# Patient Record
Sex: Male | Born: 1960 | Race: White | Hispanic: No | Marital: Single | Smoking: Never smoker
Health system: Southern US, Community
[De-identification: ages and names within clinical notes are randomized; demographics above are authoritative.]

## PROBLEM LIST (undated history)

## (undated) DIAGNOSIS — E781 Pure hyperglyceridemia: Secondary | ICD-10-CM

## (undated) DIAGNOSIS — I509 Heart failure, unspecified: Secondary | ICD-10-CM

## (undated) DIAGNOSIS — Z87442 Personal history of urinary calculi: Secondary | ICD-10-CM

## (undated) DIAGNOSIS — J42 Unspecified chronic bronchitis: Secondary | ICD-10-CM

## (undated) DIAGNOSIS — I10 Essential (primary) hypertension: Secondary | ICD-10-CM

## (undated) DIAGNOSIS — S0990XA Unspecified injury of head, initial encounter: Secondary | ICD-10-CM

## (undated) DIAGNOSIS — F32A Depression, unspecified: Secondary | ICD-10-CM

## (undated) DIAGNOSIS — I219 Acute myocardial infarction, unspecified: Secondary | ICD-10-CM

## (undated) DIAGNOSIS — E119 Type 2 diabetes mellitus without complications: Secondary | ICD-10-CM

## (undated) DIAGNOSIS — R51 Headache: Secondary | ICD-10-CM

## (undated) DIAGNOSIS — M545 Low back pain, unspecified: Secondary | ICD-10-CM

## (undated) DIAGNOSIS — F329 Major depressive disorder, single episode, unspecified: Secondary | ICD-10-CM

## (undated) DIAGNOSIS — I519 Heart disease, unspecified: Secondary | ICD-10-CM

## (undated) DIAGNOSIS — R519 Headache, unspecified: Secondary | ICD-10-CM

## (undated) DIAGNOSIS — E785 Hyperlipidemia, unspecified: Secondary | ICD-10-CM

## (undated) DIAGNOSIS — I2510 Atherosclerotic heart disease of native coronary artery without angina pectoris: Secondary | ICD-10-CM

## (undated) DIAGNOSIS — M1990 Unspecified osteoarthritis, unspecified site: Secondary | ICD-10-CM

## (undated) DIAGNOSIS — L409 Psoriasis, unspecified: Secondary | ICD-10-CM

## (undated) DIAGNOSIS — J45909 Unspecified asthma, uncomplicated: Secondary | ICD-10-CM

## (undated) DIAGNOSIS — F419 Anxiety disorder, unspecified: Secondary | ICD-10-CM

## (undated) DIAGNOSIS — K219 Gastro-esophageal reflux disease without esophagitis: Secondary | ICD-10-CM

## (undated) DIAGNOSIS — I82409 Acute embolism and thrombosis of unspecified deep veins of unspecified lower extremity: Secondary | ICD-10-CM

## (undated) DIAGNOSIS — C159 Malignant neoplasm of esophagus, unspecified: Secondary | ICD-10-CM

## (undated) DIAGNOSIS — G8929 Other chronic pain: Secondary | ICD-10-CM

## (undated) HISTORY — DX: Psoriasis, unspecified: L40.9

## (undated) HISTORY — DX: Malignant neoplasm of esophagus, unspecified: C15.9

## (undated) HISTORY — PX: OTHER SURGICAL HISTORY: SHX169

## (undated) HISTORY — DX: Acute embolism and thrombosis of unspecified deep veins of unspecified lower extremity: I82.409

## (undated) HISTORY — DX: Morbid (severe) obesity due to excess calories: E66.01

## (undated) HISTORY — PX: FRACTURE SURGERY: SHX138

## (undated) HISTORY — DX: Pure hyperglyceridemia: E78.1

## (undated) HISTORY — PX: CORONARY ANGIOPLASTY WITH STENT PLACEMENT: SHX49

---
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
## 2004-06-20 ENCOUNTER — Emergency Department (HOSPITAL_COMMUNITY): Admission: EM | Admit: 2004-06-20 | Discharge: 2004-06-20 | Payer: Self-pay | Admitting: Emergency Medicine

## 2006-04-22 DIAGNOSIS — I82409 Acute embolism and thrombosis of unspecified deep veins of unspecified lower extremity: Secondary | ICD-10-CM

## 2006-04-22 HISTORY — DX: Acute embolism and thrombosis of unspecified deep veins of unspecified lower extremity: I82.409

## 2006-11-07 ENCOUNTER — Encounter: Payer: Self-pay | Admitting: Family Medicine

## 2006-11-07 ENCOUNTER — Inpatient Hospital Stay (HOSPITAL_COMMUNITY): Admission: AD | Admit: 2006-11-07 | Discharge: 2006-11-11 | Payer: Self-pay | Admitting: Family Medicine

## 2007-11-02 ENCOUNTER — Emergency Department (HOSPITAL_COMMUNITY): Admission: EM | Admit: 2007-11-02 | Discharge: 2007-11-03 | Payer: Self-pay | Admitting: Emergency Medicine

## 2010-02-20 DIAGNOSIS — I219 Acute myocardial infarction, unspecified: Secondary | ICD-10-CM

## 2010-02-20 DIAGNOSIS — I509 Heart failure, unspecified: Secondary | ICD-10-CM

## 2010-02-20 HISTORY — DX: Acute myocardial infarction, unspecified: I21.9

## 2010-02-20 HISTORY — DX: Heart failure, unspecified: I50.9

## 2010-03-13 ENCOUNTER — Encounter: Payer: Self-pay | Admitting: Emergency Medicine

## 2010-03-13 ENCOUNTER — Inpatient Hospital Stay (HOSPITAL_COMMUNITY): Admission: EM | Admit: 2010-03-13 | Discharge: 2010-03-20 | Payer: Self-pay | Admitting: Cardiovascular Disease

## 2010-05-18 NOTE — Procedures (Signed)
  NAMEDONEVIN, Steven Crane                  ACCOUNT NO.:  [id number]  MEDICAL RECORD NO.:  [id number]          PATIENT TYPE:  INP  LOCATION:  6531                         FACILITY:  [facility]  PHYSICIAN:  Ricki Rodriguez, M.D.  DATE OF BIRTH:  [birth year]  DATE OF PROCEDURE:  03/14/2010 DATE OF DISCHARGE:  03/20/2010                           CARDIAC CATHETERIZATION   PROCEDURE DONE BY:  Ricki Rodriguez, MD  PROCEDURES:  Left heart catheterization, selective coronary angiography, and left ventricular function study.  INDICATIONS:  This is a 50 year old white male who presented with chest pain and suffered a non-Q-wave myocardial infarction.  He has cardiac risk factor of hypertension, obesity, and hyperlipidemia.  DESCRIPTION OF PROCEDURE:  The patient was brought to the Cardiac Catheterization Laboratory and prepped and draped in the usual sterile fashion.  Right femoral artery was cannulated using modified Seldinger technique using preformed catheters.  The right and left coronary artery angiography was obtained in different angulations.  Using a pigtail catheter, left ventriculogram was performed and pressure measurements were done, left ventricular cavity and in aorta.  The patient tolerated the procedure very well, had no complications.  HEMODYNAMIC DATA:  The left ventricular pressure was 112/19 and aortic pressure was 112/80.  CORONARY ANATOMY:  Left main:  The left main was unremarkable.  Left anterior descending coronary artery:  The left anterior descending artery had proximal mild narrowing followed by 70-80% eccentric lesion. The mid and distal vessel was unremarkable.  The diagonal 1 vessel had mild proximal disease.  Left circumflex coronary artery:  The left circumflex coronary artery had a large obtuse marginal branch 1 and was unremarkable and had obtuse marginal branch 2 with a proximal 90% stenosis.  The rest of the vessels were unremarkable.  Right coronary artery:   The right coronary artery was dominant and was relatively large vessel that had mild calcification and luminal irregularities throughout, had a distal 40% eccentric lesion.  Distal marginal branch had a total occlusion.  His posterior descending coronary artery had a mild proximal disease and posterolateral branch was essentially unremarkable.  Left ventriculogram:  The left ventriculogram showed mild anterior wall hypokinesia with an ejection fraction of 60%.  IMPRESSION: 1. Moderate to severe multivessel native vessel coronary artery     disease. 2. Preserved left systolic function.  PLAN:  To consider nuclear stress test for ischemia burden and follow this with angioplasty of LAD or circumflex coronary artery or right coronary marginal branch.  The case was reviewed with Dr. Rinaldo Cloud.     Ricki Rodriguez, M.D.     ASK/MEDQ  D:  05/03/2010  T:  05/04/2010  Job:  [id number]  Electronically Signed by Orpah Iverson M.D. on 05/16/2010 12:52:19 PM

## 2010-07-03 LAB — CBC
HCT: 37.4 % — ABNORMAL LOW (ref 39.0–52.0)
HCT: 37.6 % — ABNORMAL LOW (ref 39.0–52.0)
HCT: 38.1 % — ABNORMAL LOW (ref 39.0–52.0)
HCT: 39.6 % (ref 39.0–52.0)
Hemoglobin: 12.8 g/dL — ABNORMAL LOW (ref 13.0–17.0)
Hemoglobin: 12.8 g/dL — ABNORMAL LOW (ref 13.0–17.0)
Hemoglobin: 12.9 g/dL — ABNORMAL LOW (ref 13.0–17.0)
Hemoglobin: 12.9 g/dL — ABNORMAL LOW (ref 13.0–17.0)
Hemoglobin: 14.3 g/dL (ref 13.0–17.0)
MCH: 28.8 pg (ref 26.0–34.0)
MCH: 29 pg (ref 26.0–34.0)
MCH: 29.2 pg (ref 26.0–34.0)
MCH: 29.9 pg (ref 26.0–34.0)
MCHC: 33.8 g/dL (ref 30.0–36.0)
MCHC: 34 g/dL (ref 30.0–36.0)
MCHC: 34 g/dL (ref 30.0–36.0)
MCHC: 34.5 g/dL (ref 30.0–36.0)
MCHC: 36.2 g/dL — ABNORMAL HIGH (ref 30.0–36.0)
MCV: 85.2 fL (ref 78.0–100.0)
MCV: 85.2 fL (ref 78.0–100.0)
MCV: 85.6 fL (ref 78.0–100.0)
MCV: 85.9 fL (ref 78.0–100.0)
Platelets: 203 10*3/uL (ref 150–400)
Platelets: 206 10*3/uL (ref 150–400)
Platelets: 213 10*3/uL (ref 150–400)
Platelets: 220 10*3/uL (ref 150–400)
RBC: 4.38 MIL/uL (ref 4.22–5.81)
RBC: 4.44 MIL/uL (ref 4.22–5.81)
RBC: 4.44 MIL/uL (ref 4.22–5.81)
RBC: 4.61 MIL/uL (ref 4.22–5.81)
RDW: 12.9 % (ref 11.5–15.5)
RDW: 13 % (ref 11.5–15.5)
WBC: 6.7 10*3/uL (ref 4.0–10.5)
WBC: 7 10*3/uL (ref 4.0–10.5)
WBC: 8.1 10*3/uL (ref 4.0–10.5)

## 2010-07-03 LAB — BASIC METABOLIC PANEL
BUN: 10 mg/dL (ref 6–23)
CO2: 25 mEq/L (ref 19–32)
CO2: 28 mEq/L (ref 19–32)
Calcium: 8.4 mg/dL (ref 8.4–10.5)
Chloride: 104 mEq/L (ref 96–112)
Chloride: 105 mEq/L (ref 96–112)
Creatinine, Ser: 0.7 mg/dL (ref 0.4–1.5)
GFR calc Af Amer: >60 mL/min (ref >60)
GFR calc Af Amer: >60 mL/min (ref >60)
Glucose, Bld: 142 mg/dL — ABNORMAL HIGH (ref 70–99)
Glucose, Bld: 214 mg/dL — ABNORMAL HIGH (ref 70–99)
Sodium: 137 mEq/L (ref 135–145)

## 2010-07-03 LAB — HEPARIN LEVEL (UNFRACTIONATED)
Heparin Unfractionated: 0.11 IU/mL — ABNORMAL LOW (ref 0.30–0.70)
Heparin Unfractionated: 0.38 IU/mL (ref 0.30–0.70)
Heparin Unfractionated: 0.42 IU/mL (ref 0.30–0.70)
Heparin Unfractionated: 0.63 IU/mL (ref 0.30–0.70)
Heparin Unfractionated: <0.1 IU/mL — ABNORMAL LOW (ref 0.30–0.70)

## 2010-07-03 LAB — COMPREHENSIVE METABOLIC PANEL
ALT: 34 U/L (ref 0–53)
AST: 26 U/L (ref 0–37)
Albumin: 3.6 g/dL (ref 3.5–5.2)
CO2: 23 mEq/L (ref 19–32)
Calcium: 8.9 mg/dL (ref 8.4–10.5)
Creatinine, Ser: 0.69 mg/dL (ref 0.4–1.5)
GFR calc Af Amer: >60 mL/min (ref >60)
GFR calc non Af Amer: >60 mL/min (ref >60)
Sodium: 135 mEq/L (ref 135–145)
Total Protein: 6.8 g/dL (ref 6.0–8.3)

## 2010-07-03 LAB — GLUCOSE, CAPILLARY
Glucose-Capillary: 118 mg/dL — ABNORMAL HIGH (ref 70–99)
Glucose-Capillary: 124 mg/dL — ABNORMAL HIGH (ref 70–99)
Glucose-Capillary: 125 mg/dL — ABNORMAL HIGH (ref 70–99)
Glucose-Capillary: 140 mg/dL — ABNORMAL HIGH (ref 70–99)
Glucose-Capillary: 144 mg/dL — ABNORMAL HIGH (ref 70–99)
Glucose-Capillary: 147 mg/dL — ABNORMAL HIGH (ref 70–99)
Glucose-Capillary: 158 mg/dL — ABNORMAL HIGH (ref 70–99)
Glucose-Capillary: 168 mg/dL — ABNORMAL HIGH (ref 70–99)
Glucose-Capillary: 188 mg/dL — ABNORMAL HIGH (ref 70–99)
Glucose-Capillary: 203 mg/dL — ABNORMAL HIGH (ref 70–99)
Glucose-Capillary: 211 mg/dL — ABNORMAL HIGH (ref 70–99)
Glucose-Capillary: 212 mg/dL — ABNORMAL HIGH (ref 70–99)
Glucose-Capillary: 226 mg/dL — ABNORMAL HIGH (ref 70–99)
Glucose-Capillary: 227 mg/dL — ABNORMAL HIGH (ref 70–99)
Glucose-Capillary: 229 mg/dL — ABNORMAL HIGH (ref 70–99)
Glucose-Capillary: 240 mg/dL — ABNORMAL HIGH (ref 70–99)

## 2010-07-03 LAB — CK TOTAL AND CKMB (NOT AT ARMC)
CK, MB: 4.4 ng/mL — ABNORMAL HIGH (ref 0.3–4.0)
Relative Index: 4.2 — ABNORMAL HIGH (ref 0.0–2.5)
Relative Index: INVALID (ref 0.0–2.5)
Total CK: 105 U/L (ref 7–232)

## 2010-07-03 LAB — CARDIAC PANEL(CRET KIN+CKTOT+MB+TROPI)
CK, MB: 2.3 ng/mL (ref 0.3–4.0)
Relative Index: 5.8 — ABNORMAL HIGH (ref 0.0–2.5)
Relative Index: INVALID (ref 0.0–2.5)
Total CK: 124 U/L (ref 7–232)
Total CK: 71 U/L (ref 7–232)
Troponin I: 1.46 ng/mL (ref 0.00–0.06)

## 2010-07-03 LAB — POCT CARDIAC MARKERS: Troponin i, poc: <0.05 ng/mL (ref 0.00–0.09)

## 2010-07-03 LAB — DIFFERENTIAL
Eosinophils Absolute: 0.1 10*3/uL (ref 0.0–0.7)
Eosinophils Relative: 2 % (ref 0–5)
Lymphocytes Relative: 22 % (ref 12–46)
Lymphs Abs: 1.5 10*3/uL (ref 0.7–4.0)
Monocytes Absolute: 0.4 10*3/uL (ref 0.1–1.0)
Monocytes Relative: 6 % (ref 3–12)

## 2010-07-03 LAB — LIPID PANEL
Cholesterol: 189 mg/dL (ref 0–200)
LDL Cholesterol: 103 mg/dL — ABNORMAL HIGH (ref 0–99)
Total CHOL/HDL Ratio: 5.7 RATIO
Triglycerides: 264 mg/dL — ABNORMAL HIGH (ref <150)

## 2010-07-03 LAB — PLATELET INHIBITION P2Y12
P2Y12 % Inhibition: 25 %
Platelet Function  P2Y12: 236 [PRU] (ref 194–418)
Platelet Function Baseline: 314 [PRU] (ref 194–418)

## 2010-07-03 LAB — PROTIME-INR: Prothrombin Time: 13.5 seconds (ref 11.6–15.2)

## 2010-07-03 LAB — TROPONIN I: Troponin I: 0.11 ng/mL — ABNORMAL HIGH (ref 0.00–0.06)

## 2010-09-04 NOTE — Discharge Summary (Signed)
Steven Crane, Steven Crane                  ACCOUNT NO.:  [id number]   MEDICAL RECORD NO.:  [id number]          PATIENT TYPE:  INP   LOCATION:  A327                          FACILITY:  [facility]   PHYSICIAN:  Donna Bernard, M.D.DATE OF BIRTH:  July 05, 1960   DATE OF ADMISSION:  11/07/2006  DATE OF DISCHARGE:  07/22/2008LH                               DISCHARGE SUMMARY   FINAL DIAGNOSES:  1. Deep vein thrombosis.  2. Cellulitis.  3. Type 2 diabetes.  4. Morbid obesity.  5. Hypertension.  6. Hyperlipidemia.   DISPOSITION:  1. Patient discharged home.  2. Coumadin 5 mg two daily.  3. Lovenox 150 mg q.12h.  4. Lantus 12 units q.h.s.  5. Maintain other chronic medications as specified on discharge sheet.  6. Recheck on Friday plus an INR then.   INITIAL H&P:  Please see H&P as dictated.   HOSPITAL COURSE:  This patient is a 50 year old white male with a  history of morbid obesity.  He presented in the emergency room in  [place] 4 days prior to admission with a swollen, reddened left leg,  high white blood count of 16,000 and a temp of 104.  He was started  appropriately with Rocephin injection and Omnicef for presumed  cellulitis.  Next several days did not do the best.  We followed him up  in the office.  Patient had ongoing swelling and tenderness.  We added a  different antibiotic.  He presented a few days later not improved.  We  went ahead and did a leg ultrasound which showed a DVT extending well  into the thigh.  The patient was admitted to the hospital.  He was given  IV Unasyn, he was given Coumadin orally and he was given Lovenox  injections.  Patient has responded to the antibiotics nicely.  His leg  is feeling much better.  I have had a difficult time getting him  therapeutic on the INR.  Clinically, at this point patient is safe and  ready to head home.  We are going to discharge him home on Lovenox  injections with very close followup as noted above.      Donna Bernard, M.D.  Electronically Signed    WSL/MEDQ  D:  11/11/2006  T:  11/11/2006  Job:  [id number]

## 2010-09-04 NOTE — H&P (Signed)
Steven Crane, Steven Crane                  ACCOUNT NO.:  [id number]   MEDICAL RECORD NO.:  [id number]          PATIENT TYPE:  INP   LOCATION:  A327                          FACILITY:  [facility]   PHYSICIAN:  Donna Bernard, M.D.DATE OF BIRTH:  1960-09-18   DATE OF ADMISSION:  11/07/2006  DATE OF DISCHARGE:  LH                              HISTORY & PHYSICAL   CHIEF COMPLAINT:  Leg pain   SUBJECTIVE:  This patient is a 50 year old white male with history of  type 2 diabetes, hypertension, morbid obesity and hyperlipidemia who  arrived to the office the day of admission with acute concerns.  Four  days previously he had presented to the ER in [place] with a temperature  105.  At that point at 16,000 white blood count and very inflamed,  tender and red left leg.  The patient was given IV Rocephin and  encouraged him to be hospitalized.  He declined and basically left AMA.  The patient was placed on Omnicef generic equivalent.  He re-presents to  the office 4 days later with  ongoing pain, swelling and tenderness in  the leg.  The patient has no chest pain, no cough, no shortness of  breath.  His sugars continue to do poorly, generally range in the mid to  upper 200s.  He claims compliance with his medications which include  Lopid 600 1 b.i.d., Glucovance 5/500 2  b.i.d., Vasotec 10 mg daily and  Prilosec 20 mg OTC.   PAST SURGERIES:  Remote knee operation 15 years ago.   FAMILY HISTORY:  Significant for hypertension, diabetes, coronary artery  disease, hyperlipidemia, lung cancer.   ALLERGIES:  Very sensitive to ACTOS.   SOCIAL:  The patient works for [employer], does have for boys, no tobacco use.  Does drink alcohol, beer on occasion.   REVIEW OF SYSTEMS:  Otherwise negative.   PHYSICAL EXAMINATION:  BPM on repeat 142/80, temperature 99 patient is  alert, no acute distress.  Significant obesity noted.  HEENT: Normal.  NECK:  Supple.  LUNGS:  Clear.  HEART:  Regular rate and rhythm.  ABDOMEN:  Benign.  EXTREMITIES:  Left leg erythema, edema, tenderness, swelling evident,  positive Homans' sign.   SIGNIFICANT LABS:  CBC white blood count is now at 6.6.  It was 16 4  days ago.  D-dimer normal.  Left leg ultrasound shows a deep venous  thrombosis extending well into the thigh.   IMPRESSION:  1. Left leg DVT extending high into the thigh.  2. Left leg cellulitis. I really think this was the initiating event.      The patient initially had fever, tenderness, high white blood      count, high fever.  This led to an inactivity of the leg and I      think in turn triggered a clot.  3. Type 2 diabetes, controlled poor, recent A1c 11.5.  I  have advised      the patient we really press on with insulin if we are going to      reduce complications like this in  the future.  4. Hypertension  5. Hyperlipidemia  6. Morbid obesity.   PLAN:  As per orders.      Donna Bernard, M.D.  Electronically Signed     WSL/MEDQ  D:  11/09/2006  T:  11/09/2006  Job:  [id number]

## 2011-02-04 LAB — PROTIME-INR
INR: 1
INR: 1
INR: 1
Prothrombin Time: 12.9
Prothrombin Time: 13.3
Prothrombin Time: 13.7

## 2011-11-22 ENCOUNTER — Telehealth: Payer: Self-pay

## 2011-11-22 NOTE — Telephone Encounter (Signed)
Pt to bring meds by on 11/25/2011 and get scheduled for colonoscopy.

## 2011-11-28 NOTE — Telephone Encounter (Signed)
Pt brought med list by. Waiting on September schedule.

## 2011-12-03 ENCOUNTER — Other Ambulatory Visit: Payer: Self-pay

## 2011-12-03 DIAGNOSIS — Z139 Encounter for screening, unspecified: Secondary | ICD-10-CM

## 2011-12-10 ENCOUNTER — Telehealth: Payer: Self-pay

## 2011-12-10 DIAGNOSIS — Z139 Encounter for screening, unspecified: Secondary | ICD-10-CM

## 2011-12-10 NOTE — Telephone Encounter (Signed)
Gastroenterology Pre-Procedure Form   Called and Re-triaged pt. He waited for the Sept schedule   He had a heart attack in Nov 2011 and heart doctor ( he could not remember his name) put him on the ASA 325 mg in the AM and 81 mg in the PM  Request Date: 12/10/2011     Requesting Physician: Dr. Lubertha South     PATIENT INFORMATION:  Steven Crane is a 51 y.o., male (DOB=09/25/1960).  PROCEDURE: Procedure(s) requested: colonoscopy Procedure Reason: screening for colon cancer  PATIENT REVIEW QUESTIONS: The patient reports the following:   1. Diabetes Melitis:YES 2. Joint replacements in the past 12 months: no 3. Major health problems in the past 3 months: no 4. Has an artificial valve or MVP:no 5. Has been advised in past to take antibiotics in advance of a procedure like teeth cleaning: no}    MEDICATIONS & ALLERGIES:    Patient reports the following regarding taking any blood thinners:   Plavix? no Aspirin  YES Coumadin   No  Patient confirms/reports the following medications:  Current Outpatient Prescriptions  Medication Sig Dispense Refill  . ALPRAZolam (XANAX) 0.5 MG tablet Take 0.5 mg by mouth at bedtime as needed. Pt takes one to one and a half tablet at bedtime      . aspirin 325 MG tablet Take 325 mg by mouth daily. Pt takes one 325 mg ASA in the AM      . aspirin 81 MG tablet Take 81 mg by mouth daily. Pt takes one 81 mg ASA in the PM      . fenofibrate 160 MG tablet Take 160 mg by mouth daily.      Marland Kitchen glyBURIDE-metformin (GLUCOVANCE) 5-500 MG per tablet Take 1 tablet by mouth 2 (two) times daily with a meal.      . omeprazole (PRILOSEC) 40 MG capsule Take 40 mg by mouth daily.      . pravastatin (PRAVACHOL) 40 MG tablet Take 40 mg by mouth daily.        Patient confirms/reports the following allergies:  No Known Allergies  Patient is appropriate to schedule for requested procedure(s): yes  AUTHORIZATION INFORMATION Primary Insurance:   ID #:   Group #:  Pre-Cert /  Auth required: Pre-Cert / Auth #:   Secondary Insurance:   ID #:   Group #:  Pre-Cert / Auth required: Pre-Cert / Auth #:   No orders of the defined types were placed in this encounter.    SCHEDULE INFORMATION: Procedure has been scheduled as follows:  Date: 01/06/2012     Time: 7:30 AM  Location: [facility] Short Stay  This Gastroenterology Pre-Precedure Form is being routed to the following provider(s) for review: R. Roetta Sessions, MD

## 2011-12-10 NOTE — Telephone Encounter (Signed)
OK to proceed with colonoscopy Take half of your Glucovance the day prior to the colonoscopy (date of prep) Hold diabetes medications day of procedure Bring all your medications and/or any insulin to the hospital the day of the procedure. Follow blood sugars, call us or your PCP if any problems.

## 2011-12-11 MED ORDER — PEG 3350-KCL-NA BICARB-NACL 420 G PO SOLR: 4000.0000 L | ORAL | Status: AC

## 2011-12-11 NOTE — Telephone Encounter (Signed)
Rx sent to [facility] in [place]. Instructions mailed to pt.

## 2011-12-11 NOTE — Telephone Encounter (Signed)
See triage dated 12/10/2011.

## 2011-12-24 ENCOUNTER — Encounter (HOSPITAL_COMMUNITY): Payer: Self-pay | Admitting: Pharmacy Technician

## 2011-12-31 ENCOUNTER — Telehealth: Payer: Self-pay

## 2011-12-31 NOTE — Telephone Encounter (Signed)
See triage addendum

## 2011-12-31 NOTE — Telephone Encounter (Signed)
Called to update triage. Pt said he has not had any new problems and no change in his medicines. He has received his instructions and picked up his prescription. He will call if he has any questions.

## 2011-12-31 NOTE — Telephone Encounter (Signed)
Ok to proceed w/ colonoscopy w/ instructions as below

## 2012-01-06 ENCOUNTER — Ambulatory Visit (HOSPITAL_COMMUNITY)
Admission: RE | Admit: 2012-01-06 | Discharge: 2012-01-06 | Disposition: A | Discharge status: Home / Self Care | Payer: BC Managed Care – PPO | Source: Ambulatory Visit | Attending: Internal Medicine | Admitting: Internal Medicine

## 2012-01-06 ENCOUNTER — Encounter (HOSPITAL_COMMUNITY)
Admission: RE | Disposition: A | Discharge status: Home / Self Care | Payer: Self-pay | Source: Ambulatory Visit | Attending: Internal Medicine

## 2012-01-06 ENCOUNTER — Encounter (HOSPITAL_COMMUNITY): Payer: Self-pay | Admitting: *Deleted

## 2012-01-06 DIAGNOSIS — Z1211 Encounter for screening for malignant neoplasm of colon: Secondary | ICD-10-CM | POA: Insufficient documentation

## 2012-01-06 DIAGNOSIS — Z139 Encounter for screening, unspecified: Secondary | ICD-10-CM

## 2012-01-06 DIAGNOSIS — I10 Essential (primary) hypertension: Secondary | ICD-10-CM | POA: Insufficient documentation

## 2012-01-06 DIAGNOSIS — E785 Hyperlipidemia, unspecified: Secondary | ICD-10-CM | POA: Insufficient documentation

## 2012-01-06 DIAGNOSIS — E119 Type 2 diabetes mellitus without complications: Secondary | ICD-10-CM | POA: Insufficient documentation

## 2012-01-06 HISTORY — DX: Unspecified injury of head, initial encounter: S09.90XA

## 2012-01-06 HISTORY — DX: Gastro-esophageal reflux disease without esophagitis: K21.9

## 2012-01-06 HISTORY — DX: Acute myocardial infarction, unspecified: I21.9

## 2012-01-06 HISTORY — DX: Essential (primary) hypertension: I10

## 2012-01-06 HISTORY — DX: Atherosclerotic heart disease of native coronary artery without angina pectoris: I25.10

## 2012-01-06 HISTORY — DX: Unspecified asthma, uncomplicated: J45.909

## 2012-01-06 HISTORY — DX: Hyperlipidemia, unspecified: E78.5

## 2012-01-06 HISTORY — DX: Headache: R51

## 2012-01-06 HISTORY — PX: COLONOSCOPY: SHX5424

## 2012-01-06 HISTORY — DX: Unspecified osteoarthritis, unspecified site: M19.90

## 2012-01-06 SURGERY — COLONOSCOPY
Anesthesia: Moderate Sedation

## 2012-01-06 MED ORDER — SIMETHICONE 40 MG/0.6ML PO SUSP
ORAL | Status: DC | PRN
  Administered 2012-01-06: 07:00:00

## 2012-01-06 MED ORDER — MIDAZOLAM HCL 5 MG/5ML IJ SOLN
INTRAMUSCULAR | Status: AC
  Filled 2012-01-06: qty 10

## 2012-01-06 MED ORDER — MIDAZOLAM HCL 5 MG/5ML IJ SOLN
INTRAMUSCULAR | Status: DC | PRN
  Administered 2012-01-06 (×2): 2 mg via INTRAVENOUS

## 2012-01-06 MED ORDER — SODIUM CHLORIDE 0.45 % IV SOLN
INTRAVENOUS | Status: DC
  Administered 2012-01-06: 1000 mL via INTRAVENOUS

## 2012-01-06 MED ORDER — MEPERIDINE HCL 100 MG/ML IJ SOLN
INTRAMUSCULAR | Status: AC
  Filled 2012-01-06: qty 2

## 2012-01-06 MED ORDER — MEPERIDINE HCL 100 MG/ML IJ SOLN
INTRAMUSCULAR | Status: DC | PRN
  Administered 2012-01-06 (×2): 50 mg via INTRAVENOUS

## 2012-01-06 NOTE — H&P (Signed)
Primary Care Physician:  Harlow Asa, MD Primary Gastroenterologist:  Dr. Jena Gauss  Pre-Procedure History & Physical: HPI:  Steven Crane is a 51 y.o. male is here for a screening colonoscopy. No bowel symptoms. No Family history of colon cancer colon polyps. No prior colonoscopy.  Past Medical History  Diagnosis Date  . Coronary artery disease   . Closed head injury 1975  . Myocardial infarction   . Hypertension   . Hyperlipemia   . Asthma     as a child  . Diabetes mellitus   . GERD (gastroesophageal reflux disease)   . Headache   . Arthritis     Past Surgical History  Procedure Date  . Arthroscopy left knee 1981  . Cardiac catheterization 2011  . Coronary stent placement Nov  2011    Prior to Admission medications   Medication Sig Start Date End Date Taking? Authorizing Provider  ALPRAZolam Prudy Feeler) 0.5 MG tablet Take 0.5-1 mg by mouth at bedtime as needed. For sleep   Yes Historical Provider, MD  aspirin 325 MG tablet Take 325 mg by mouth daily. Pt takes one 325 mg ASA in the AM   Yes Historical Provider, MD  aspirin 81 MG tablet Take 81 mg by mouth daily. Pt takes one 81 mg ASA in the PM   Yes Historical Provider, MD  enalapril (VASOTEC) 10 MG tablet Take 10 mg by mouth daily.   Yes Historical Provider, MD  fenofibrate 160 MG tablet Take 160 mg by mouth daily.   Yes Historical Provider, MD  glyBURIDE-metformin (GLUCOVANCE) 5-500 MG per tablet Take 2 tablets by mouth 2 (two) times daily with a meal.    Yes Historical Provider, MD  omeprazole (PRILOSEC) 40 MG capsule Take 40 mg by mouth daily.   Yes Historical Provider, MD  pravastatin (PRAVACHOL) 40 MG tablet Take 40 mg by mouth daily.   Yes Historical Provider, MD    Allergies as of 12/03/2011  . (Not on File)    Family History  Problem Relation Age of Onset  . Coronary artery disease Father   . Diabetes type II Father     History   Social History  . Marital Status: Divorced    Spouse Name: N/A    Number of  Children: N/A  . Years of Education: N/A   Occupational History  . Not on file.   Social History Main Topics  . Smoking status: Never Smoker   . Smokeless tobacco: Current User    Types: Snuff  . Alcohol Use: Yes     social  . Drug Use: No  . Sexually Active: Yes   Other Topics Concern  . Not on file   Social History Narrative  . No narrative on file    Review of Systems: See HPI, otherwise negative ROS  Physical Exam: BP 147/88  Pulse 72  Temp 98.6 F (37 C) (Oral)  Resp 20  Ht 6\' 2"  (1.88 m)  Wt 310 lb (140.615 kg)  BMI 39.80 kg/m2  SpO2 96% General:   Alert,  Well-developed, well-nourished, pleasant and cooperative in NAD Head:  Normocephalic and atraumatic. Eyes:  Sclera clear, no icterus.   Conjunctiva pink. Ears:  Normal auditory acuity. Nose:  No deformity, discharge,  or lesions. Mouth:  No deformity or lesions, dentition normal. Neck:  Supple; no masses or thyromegaly. Lungs:  Clear throughout to auscultation.   No wheezes, crackles, or rhonchi. No acute distress. Heart:  Regular rate and rhythm; no murmurs, clicks, rubs,  or gallops. Abdomen:  Obese. Positive bowel sounds. Soft nontender without appreciable mass or organomegaly Msk:  Symmetrical without gross deformities. Normal posture. Pulses:  Normal pulses noted. Extremities:  Without clubbing or edema. Neurologic:  Alert and  oriented x4;  grossly normal neurologically. Skin:  Intact without significant lesions or rashes. Cervical Nodes:  No significant cervical adenopathy. Psych:  Alert and cooperative. Normal mood and affect.  Impression/Plan: Steven Crane is now here to undergo a screening colonoscopy. First ever average risk screening examination.  Risks, benefits, limitations, imponderables and alternatives regarding colonoscopy have been reviewed with the patient. Questions have been answered. All parties agreeable.

## 2012-01-06 NOTE — Op Note (Signed)
[facility] [address] [place], [address]   COLONOSCOPY PROCEDURE REPORT  PATIENT: Steven Crane, Steven Crane  MR#:         [id number] BIRTHDATE: 1960/06/05 , 51  yrs. old GENDER: Male ENDOSCOPIST: R.  Roetta Sessions, MD FACP FACG REFERRED BY:  Simone Curia, M.D. PROCEDURE DATE:  01/06/2012 PROCEDURE:     screening colonoscopy  INDICATIONS: first ever average risk screening examination  INFORMED CONSENT:  The risks, benefits, alternatives and imponderables including but not limited to bleeding, perforation as well as the possibility of a missed lesion have been reviewed.  The potential for biopsy, lesion removal, etc. have also been discussed.  Questions have been answered.  All parties agreeable. Please see the history and physical in the medical record for more information.  MEDICATIONS: Versed 4 mg IV and Demerol 100 mg IV in divided doses the  DESCRIPTION OF PROCEDURE:  After a digital rectal exam was performed, the EC-[id number] ([id number])  colonoscope was advanced from the anus through the rectum and colon to the area of the cecum, ileocecal valve and appendiceal orifice.  The cecum was deeply intubated.  These structures were well-seen and photographed for the record.  From the level of the cecum and ileocecal valve, the scope was slowly and cautiously withdrawn.  The mucosal surfaces were carefully surveyed utilizing scope tip deflection to facilitate fold flattening as needed.  The scope was pulled down into the rectum where a thorough examination including retroflexion was performed.    FINDINGS:  suboptimal preparation. Normal rectum. Normal appearing colonic mucosa.  THERAPEUTIC / DIAGNOSTIC MANEUVERS PERFORMED:  none  COMPLICATIONS: none  CECAL WITHDRAWAL TIME:  8 minutes  IMPRESSION:  normal rectum and colon  RECOMMENDATIONS: repeat screening colonoscopy in 10 years   _______________________________ eSigned:  R. Roetta Sessions, MD FACP [facility]  01/06/2012 8:08 AM   CC:

## 2012-01-08 ENCOUNTER — Encounter (HOSPITAL_COMMUNITY): Payer: Self-pay | Admitting: Internal Medicine

## 2012-08-12 ENCOUNTER — Ambulatory Visit (INDEPENDENT_AMBULATORY_CARE_PROVIDER_SITE_OTHER): Payer: BC Managed Care – PPO | Admitting: Family Medicine

## 2012-08-12 ENCOUNTER — Encounter: Payer: Self-pay | Admitting: Family Medicine

## 2012-08-12 VITALS — BP 132/77 | Temp 98.7°F | Wt 299.0 lb

## 2012-08-12 DIAGNOSIS — M549 Dorsalgia, unspecified: Secondary | ICD-10-CM

## 2012-08-12 DIAGNOSIS — R5381 Other malaise: Secondary | ICD-10-CM

## 2012-08-12 DIAGNOSIS — R5383 Other fatigue: Secondary | ICD-10-CM

## 2012-08-12 DIAGNOSIS — E119 Type 2 diabetes mellitus without complications: Secondary | ICD-10-CM

## 2012-08-12 DIAGNOSIS — E785 Hyperlipidemia, unspecified: Secondary | ICD-10-CM

## 2012-08-12 MED ORDER — ETODOLAC ER 400 MG PO TB24: 400.0000 mg | ORAL_TABLET | Freq: Every day | ORAL | Status: DC

## 2012-08-12 MED ORDER — CIPROFLOXACIN-HYDROCORTISONE 0.2-1 % OT SUSP: 4.0000 [drp] | Freq: Two times a day (BID) | OTIC | Status: AC

## 2012-08-12 MED ORDER — CHLORZOXAZONE 500 MG PO TABS: 500.0000 mg | ORAL_TABLET | Freq: Four times a day (QID) | ORAL | Status: DC | PRN

## 2012-08-12 NOTE — Patient Instructions (Signed)
Be sure to get blood work week before next visit

## 2012-08-12 NOTE — Progress Notes (Signed)
  Subjective:    Patient ID: Steven Crane, male    DOB: [birth year], 52 y.o.   MRN: [id number]  Otalgia  There is pain in the right ear. This is a new problem. The current episode started in the past 7 days. The problem has been gradually worsening. There has been no fever. The pain is moderate. Associated symptoms include coughing, ear discharge and rhinorrhea. He has tried nothing for the symptoms. The treatment provided no relief. His past medical history is significant for a chronic ear infection. There is no history of hearing loss.  Back Pain This is a new problem. The current episode started in the past 7 days. The problem occurs constantly. The problem has been gradually worsening since onset. The quality of the pain is described as shooting and aching. The pain is at a severity of 6/10. The pain is moderate. The pain is worse during the day. The symptoms are aggravated by bending. Stiffness is present all day. Risk factors include recent trauma. He has tried analgesics and ice for the symptoms. The treatment provided mild relief.      Review of Systems  HENT: Positive for ear pain, rhinorrhea and ear discharge.   Respiratory: Positive for cough.   Musculoskeletal: Positive for back pain.       Objective:   Physical Exam   Right external canal inflamed. Positive tenderness. Pharynx normal neck supple. Lungs clear. Heart regular rate and rhythm. Diffuse lumbar tenderness. Negative straight leg raise.     Assessment & Plan:  Impression #1 external otitis. #2 lumbar strain. Plan local measures discussed. C. medicines as prescribed. Appropriate blood work for chronic visit next month. WSL

## 2012-08-14 LAB — LIPID PANEL
Cholesterol: 126 mg/dL (ref 0–200)
HDL: 29 mg/dL — ABNORMAL LOW (ref >39)
LDL Cholesterol: 76 mg/dL (ref 0–99)
Triglycerides: 103 mg/dL (ref <150)
VLDL: 21 mg/dL (ref 0–40)

## 2012-08-14 LAB — HEPATIC FUNCTION PANEL
ALT: 21 U/L (ref 0–53)
AST: 16 U/L (ref 0–37)
Albumin: 3.5 g/dL (ref 3.5–5.2)
Alkaline Phosphatase: 42 U/L (ref 39–117)
Total Bilirubin: 0.3 mg/dL (ref 0.3–1.2)

## 2012-08-17 ENCOUNTER — Encounter: Payer: Self-pay | Admitting: Family Medicine

## 2012-08-25 ENCOUNTER — Encounter: Payer: Self-pay | Admitting: Family Medicine

## 2012-09-01 ENCOUNTER — Other Ambulatory Visit: Payer: Self-pay | Admitting: Family Medicine

## 2012-09-12 ENCOUNTER — Other Ambulatory Visit: Payer: Self-pay | Admitting: Family Medicine

## 2012-09-17 ENCOUNTER — Encounter: Payer: Self-pay | Admitting: *Deleted

## 2012-09-18 ENCOUNTER — Ambulatory Visit: Payer: BC Managed Care – PPO | Admitting: Family Medicine

## 2012-09-25 ENCOUNTER — Encounter: Payer: Self-pay | Admitting: Family Medicine

## 2012-09-25 ENCOUNTER — Ambulatory Visit (INDEPENDENT_AMBULATORY_CARE_PROVIDER_SITE_OTHER): Payer: BC Managed Care – PPO | Admitting: Family Medicine

## 2012-09-25 VITALS — BP 138/82 | HR 80 | Wt 290.0 lb

## 2012-09-25 DIAGNOSIS — I2510 Atherosclerotic heart disease of native coronary artery without angina pectoris: Secondary | ICD-10-CM

## 2012-09-25 DIAGNOSIS — I10 Essential (primary) hypertension: Secondary | ICD-10-CM

## 2012-09-25 DIAGNOSIS — L408 Other psoriasis: Secondary | ICD-10-CM

## 2012-09-25 DIAGNOSIS — L409 Psoriasis, unspecified: Secondary | ICD-10-CM

## 2012-09-25 DIAGNOSIS — Z86718 Personal history of other venous thrombosis and embolism: Secondary | ICD-10-CM

## 2012-09-25 DIAGNOSIS — E119 Type 2 diabetes mellitus without complications: Secondary | ICD-10-CM

## 2012-09-25 DIAGNOSIS — E785 Hyperlipidemia, unspecified: Secondary | ICD-10-CM

## 2012-09-25 NOTE — Progress Notes (Signed)
  Subjective:    Patient ID: Steven Crane, male    DOB: 06-13-1960, 52 y.o.   MRN: [id number]  Diabetes He has type 2 diabetes mellitus. His disease course has been stable. Associated symptoms include blurred vision. Pertinent negatives for diabetes include no chest pain. Symptoms are stable. Risk factors for coronary artery disease include diabetes mellitus, hypertension, dyslipidemia and male sex. Current diabetic treatment includes oral agent (monotherapy). He is following a diabetic diet. Meal planning includes avoidance of concentrated sweets. He has not had a previous visit with a dietician. He participates in exercise every other day. Home blood sugar record trend: pt unaware of fasting numbers not checking. An ACE inhibitor/angiotensin II receptor blocker is being taken. Eye exam is not current.   Patient has no chest pain. Known history of coronary artery disease. Trying to watch his diet. No smoke exposure.  Patient reports compliance with his blood pressure medication. Generally well-tolerated.  Patient trying to stick with his lipid medicine. Phone his diet closely. Did have blood work earlier this spring.  Sticking with his triglyceride medicine. Trying to watch his diet in this regard Results for orders placed in visit on 08/12/12  LIPID PANEL      Result Value Range   Cholesterol 126  0 - 200 mg/dL   Triglycerides 409  <811 mg/dL   HDL 29 (*) >91 mg/dL   Total CHOL/HDL Ratio 4.3     VLDL 21  0 - 40 mg/dL   LDL Cholesterol 76  0 - 99 mg/dL  HEMOGLOBIN Y7W      Result Value Range   Hemoglobin A1C 8.3 (*) <5.7 %   Mean Plasma Glucose 192 (*) <117 mg/dL  HEPATIC FUNCTION PANEL      Result Value Range   Total Bilirubin 0.3  0.3 - 1.2 mg/dL   Bilirubin, Direct 0.1  0.0 - 0.3 mg/dL   Indirect Bilirubin 0.2  0.0 - 0.9 mg/dL   Alkaline Phosphatase 42  39 - 117 U/L   AST 16  0 - 37 U/L   ALT 21  0 - 53 U/L   Total Protein 6.0  6.0 - 8.3 g/dL   Albumin 3.5  3.5 - 5.2 g/dL      Review of Systems  Eyes: Positive for blurred vision.  Cardiovascular: Negative for chest pain.   ROS otherwise negative    Objective:   Physical Exam  Alert no acute distress. HEENT normal. Lungs clear. Heart regular in rhythm. Significant obesity present feet sensation intact pulses good. Slight edema..      Assessment & Plan:  Impression 1 hypertension decent control. #2 coronary artery disease asymptomatic #3 hyperlipidemia controlled good in the spring. #4 reflux clinically stable. #5 type 2 diabetes suboptimal in with A1c in 8 discussed at length. Plan meds discussed. Maintain all the same. Diet exercise discussed. Encouraged to see eye doctor. Recheck in several months. A1c not improve will need to add TAC on the insulin. WSL.

## 2012-09-25 NOTE — Patient Instructions (Signed)
Work hard on diet and exercise. When we see you next, if a1c is worse we will add insulin again.  Please get an eye doctor visit

## 2012-10-05 ENCOUNTER — Other Ambulatory Visit: Payer: Self-pay | Admitting: *Deleted

## 2012-10-05 MED ORDER — GLYBURIDE-METFORMIN 5-500 MG PO TABS: ORAL_TABLET | ORAL | Status: DC

## 2012-11-30 ENCOUNTER — Encounter: Payer: Self-pay | Admitting: Family Medicine

## 2012-11-30 ENCOUNTER — Ambulatory Visit (INDEPENDENT_AMBULATORY_CARE_PROVIDER_SITE_OTHER): Payer: BC Managed Care – PPO | Admitting: Family Medicine

## 2012-11-30 VITALS — BP 144/82 | Temp 98.6°F | Ht 74.0 in | Wt 332.4 lb

## 2012-11-30 DIAGNOSIS — J329 Chronic sinusitis, unspecified: Secondary | ICD-10-CM

## 2012-11-30 DIAGNOSIS — J45901 Unspecified asthma with (acute) exacerbation: Secondary | ICD-10-CM

## 2012-11-30 DIAGNOSIS — J683 Other acute and subacute respiratory conditions due to chemicals, gases, fumes and vapors: Secondary | ICD-10-CM | POA: Insufficient documentation

## 2012-11-30 MED ORDER — ALBUTEROL SULFATE HFA 108 (90 BASE) MCG/ACT IN AERS: 2.0000 | INHALATION_SPRAY | Freq: Four times a day (QID) | RESPIRATORY_TRACT | Status: DC | PRN

## 2012-11-30 MED ORDER — LEVOFLOXACIN 500 MG PO TABS: 500.0000 mg | ORAL_TABLET | Freq: Every day | ORAL | Status: AC

## 2012-11-30 NOTE — Patient Instructions (Signed)
Take all the antibiotics 

## 2012-11-30 NOTE — Progress Notes (Signed)
  Subjective:    Patient ID: Steven Crane, male    DOB: 1960/09/07, 52 y.o.   MRN: [id number]  Sinusitis This is a new problem. The current episode started in the past 7 days. The problem has been gradually worsening since onset. The maximum temperature recorded prior to his arrival was 100 - 100.9 F. The fever has been present for 3 to 4 days. His pain is at a severity of 5/10. The pain is moderate. Associated symptoms include coughing (wheezing associated with the cough) and headaches. Past treatments include nothing. The treatment provided mild relief.      Review of Systems  Respiratory: Positive for cough (wheezing associated with the cough).   Neurological: Positive for headaches.       Objective:   Physical Exam  Alert mild malaise. Lungs mild wheezes no tachypnea no rhonchi heart regular rate and rhythm. HEENT nasal congestion frontal tenderness.      Assessment & Plan:  Impression rhinosinusitis #2 bronchitis with reactive airways. Plan Levaquin daily for 10 days. Ventolin when necessary for wheezes. Symptomatic care discussed. WSL

## 2012-12-07 ENCOUNTER — Other Ambulatory Visit: Payer: Self-pay | Admitting: Family Medicine

## 2012-12-07 NOTE — Telephone Encounter (Signed)
Ok plus 5 monthly ref 

## 2012-12-10 ENCOUNTER — Encounter: Payer: Self-pay | Admitting: *Deleted

## 2012-12-10 ENCOUNTER — Other Ambulatory Visit: Payer: Self-pay | Admitting: *Deleted

## 2012-12-10 MED ORDER — GLYBURIDE-METFORMIN 5-500 MG PO TABS: ORAL_TABLET | ORAL | Status: DC

## 2012-12-25 ENCOUNTER — Other Ambulatory Visit: Payer: Self-pay | Admitting: *Deleted

## 2012-12-25 ENCOUNTER — Ambulatory Visit: Payer: BC Managed Care – PPO | Admitting: Family Medicine

## 2012-12-25 MED ORDER — OMEPRAZOLE 40 MG PO CPDR: 40.0000 mg | DELAYED_RELEASE_CAPSULE | Freq: Every day | ORAL | Status: DC

## 2013-01-12 ENCOUNTER — Other Ambulatory Visit: Payer: Self-pay | Admitting: *Deleted

## 2013-01-12 MED ORDER — GLYBURIDE-METFORMIN 5-500 MG PO TABS: ORAL_TABLET | ORAL | Status: DC

## 2013-01-12 MED ORDER — ENALAPRIL MALEATE 10 MG PO TABS: ORAL_TABLET | ORAL | Status: DC

## 2013-02-12 ENCOUNTER — Encounter: Payer: Self-pay | Admitting: Family Medicine

## 2013-02-12 ENCOUNTER — Telehealth: Payer: Self-pay | Admitting: Family Medicine

## 2013-02-12 ENCOUNTER — Ambulatory Visit (INDEPENDENT_AMBULATORY_CARE_PROVIDER_SITE_OTHER): Payer: BC Managed Care – PPO | Admitting: Family Medicine

## 2013-02-12 VITALS — BP 130/86 | Ht 74.0 in | Wt 313.0 lb

## 2013-02-12 DIAGNOSIS — J683 Other acute and subacute respiratory conditions due to chemicals, gases, fumes and vapors: Secondary | ICD-10-CM

## 2013-02-12 DIAGNOSIS — Z23 Encounter for immunization: Secondary | ICD-10-CM

## 2013-02-12 DIAGNOSIS — I2510 Atherosclerotic heart disease of native coronary artery without angina pectoris: Secondary | ICD-10-CM

## 2013-02-12 DIAGNOSIS — E785 Hyperlipidemia, unspecified: Secondary | ICD-10-CM

## 2013-02-12 DIAGNOSIS — E119 Type 2 diabetes mellitus without complications: Secondary | ICD-10-CM

## 2013-02-12 DIAGNOSIS — J45909 Unspecified asthma, uncomplicated: Secondary | ICD-10-CM

## 2013-02-12 LAB — POCT GLYCOSYLATED HEMOGLOBIN (HGB A1C): Hemoglobin A1C: 9.4

## 2013-02-12 MED ORDER — ENALAPRIL MALEATE 10 MG PO TABS: ORAL_TABLET | ORAL | Status: DC

## 2013-02-12 MED ORDER — INSULIN GLARGINE 100 UNIT/ML ~~LOC~~ SOLN: SUBCUTANEOUS | Status: DC

## 2013-02-12 MED ORDER — ALPRAZOLAM 0.5 MG PO TABS: ORAL_TABLET | ORAL | Status: DC

## 2013-02-12 MED ORDER — ALBUTEROL SULFATE HFA 108 (90 BASE) MCG/ACT IN AERS: 2.0000 | INHALATION_SPRAY | Freq: Four times a day (QID) | RESPIRATORY_TRACT | Status: DC | PRN

## 2013-02-12 MED ORDER — OMEPRAZOLE 40 MG PO CPDR: 40.0000 mg | DELAYED_RELEASE_CAPSULE | Freq: Every day | ORAL | Status: DC

## 2013-02-12 MED ORDER — OMEPRAZOLE MAGNESIUM 20 MG PO TBEC: 20.0000 mg | DELAYED_RELEASE_TABLET | Freq: Every day | ORAL | Status: DC

## 2013-02-12 MED ORDER — PRAVASTATIN SODIUM 40 MG PO TABS: 40.0000 mg | ORAL_TABLET | Freq: Every day | ORAL | Status: DC

## 2013-02-12 MED ORDER — GLYBURIDE-METFORMIN 5-500 MG PO TABS: ORAL_TABLET | ORAL | Status: DC

## 2013-02-12 MED ORDER — FENOFIBRATE 160 MG PO TABS: 160.0000 mg | ORAL_TABLET | Freq: Every day | ORAL | Status: DC

## 2013-02-12 NOTE — Patient Instructions (Signed)
Start back 12 units  q hs.

## 2013-02-12 NOTE — Telephone Encounter (Signed)
Ok 11 ref. Absolutely silly we have to re name the same med

## 2013-02-12 NOTE — Telephone Encounter (Signed)
Patient states that omeprazole is too expensive and wants to get Rx for Prilosec OTC because it is cheaper  [facility] in [place]

## 2013-02-12 NOTE — Progress Notes (Signed)
  Subjective:    Patient ID: Steven Crane, male    DOB: 20-Apr-1961, 52 y.o.   MRN: [id number]  Diabetes He presents for his follow-up diabetic visit. Current diabetic treatment includes oral agent (dual therapy).  takes diab med regularly, notcking numbers. Not checking fasting sugars. Claims compliance with his medicine.  Diet so so.has cut back on amnts. Admits he was taking in too much sugar at times.   Compliant with blood pressure medication excessive salt at times no headache or chest pain.  Breathing is fine, except occas short on breath with exertion.  When stretched out, knees and backs ache.    Results for orders placed in visit on 02/12/13  POCT GLYCOSYLATED HEMOGLOBIN (HGB A1C)      Result Value Range   Hemoglobin A1C 9.4        Review of Systems No headache no back pain no abdominal pain no shortness of breath ROS otherwise negative    Objective:   Physical Exam  Alert obesity present HEENT normal. Blood pressure similar on repeat. Lungs clear. Heart rare in rhythm. Abdomen benign.      Assessment & Plan:  Impression 1 type 2 diabetes poor control discussed at length #2 hypertension good control. #3 asthma clinically stable #4 hyperlipidemia discussed. Plan diet exercise discussed. Resume Lantus 10 units each day. Rationale discussed maintain other meds. Recheck in several months. Start checking sugars again. WSL

## 2013-02-12 NOTE — Telephone Encounter (Signed)
Rx sent electronically to [facility]. Patient notified. 

## 2013-03-11 ENCOUNTER — Other Ambulatory Visit: Payer: Self-pay | Admitting: Family Medicine

## 2013-03-12 ENCOUNTER — Telehealth: Payer: Self-pay | Admitting: Family Medicine

## 2013-03-12 NOTE — Telephone Encounter (Signed)
error 

## 2013-04-04 ENCOUNTER — Emergency Department (HOSPITAL_COMMUNITY)
Admission: EM | Admit: 2013-04-04 | Discharge: 2013-04-04 | Disposition: A | Discharge status: Home / Self Care | Payer: BC Managed Care – PPO | Attending: Emergency Medicine | Admitting: Emergency Medicine

## 2013-04-04 ENCOUNTER — Emergency Department (HOSPITAL_COMMUNITY): Payer: BC Managed Care – PPO

## 2013-04-04 ENCOUNTER — Encounter (HOSPITAL_COMMUNITY): Payer: Self-pay | Admitting: Emergency Medicine

## 2013-04-04 DIAGNOSIS — I2510 Atherosclerotic heart disease of native coronary artery without angina pectoris: Secondary | ICD-10-CM | POA: Insufficient documentation

## 2013-04-04 DIAGNOSIS — Z86718 Personal history of other venous thrombosis and embolism: Secondary | ICD-10-CM | POA: Insufficient documentation

## 2013-04-04 DIAGNOSIS — Z9861 Coronary angioplasty status: Secondary | ICD-10-CM | POA: Insufficient documentation

## 2013-04-04 DIAGNOSIS — E669 Obesity, unspecified: Secondary | ICD-10-CM | POA: Insufficient documentation

## 2013-04-04 DIAGNOSIS — K219 Gastro-esophageal reflux disease without esophagitis: Secondary | ICD-10-CM | POA: Insufficient documentation

## 2013-04-04 DIAGNOSIS — I10 Essential (primary) hypertension: Secondary | ICD-10-CM | POA: Insufficient documentation

## 2013-04-04 DIAGNOSIS — R61 Generalized hyperhidrosis: Secondary | ICD-10-CM | POA: Insufficient documentation

## 2013-04-04 DIAGNOSIS — E781 Pure hyperglyceridemia: Secondary | ICD-10-CM | POA: Insufficient documentation

## 2013-04-04 DIAGNOSIS — Z7982 Long term (current) use of aspirin: Secondary | ICD-10-CM | POA: Insufficient documentation

## 2013-04-04 DIAGNOSIS — Z872 Personal history of diseases of the skin and subcutaneous tissue: Secondary | ICD-10-CM | POA: Insufficient documentation

## 2013-04-04 DIAGNOSIS — Z87828 Personal history of other (healed) physical injury and trauma: Secondary | ICD-10-CM | POA: Insufficient documentation

## 2013-04-04 DIAGNOSIS — I252 Old myocardial infarction: Secondary | ICD-10-CM | POA: Insufficient documentation

## 2013-04-04 DIAGNOSIS — E119 Type 2 diabetes mellitus without complications: Secondary | ICD-10-CM | POA: Insufficient documentation

## 2013-04-04 DIAGNOSIS — Z794 Long term (current) use of insulin: Secondary | ICD-10-CM | POA: Insufficient documentation

## 2013-04-04 DIAGNOSIS — N201 Calculus of ureter: Secondary | ICD-10-CM

## 2013-04-04 DIAGNOSIS — Z79899 Other long term (current) drug therapy: Secondary | ICD-10-CM | POA: Insufficient documentation

## 2013-04-04 DIAGNOSIS — M129 Arthropathy, unspecified: Secondary | ICD-10-CM | POA: Insufficient documentation

## 2013-04-04 DIAGNOSIS — J45909 Unspecified asthma, uncomplicated: Secondary | ICD-10-CM | POA: Insufficient documentation

## 2013-04-04 LAB — COMPREHENSIVE METABOLIC PANEL
ALT: 39 U/L (ref 0–53)
BUN: 17 mg/dL (ref 6–23)
CO2: 24 mEq/L (ref 19–32)
Calcium: 8.8 mg/dL (ref 8.4–10.5)
Creatinine, Ser: 0.82 mg/dL (ref 0.50–1.35)
GFR calc Af Amer: >90 mL/min (ref >90)
GFR calc non Af Amer: >90 mL/min (ref >90)
Glucose, Bld: 295 mg/dL — ABNORMAL HIGH (ref 70–99)
Sodium: 134 mEq/L — ABNORMAL LOW (ref 135–145)
Total Protein: 7 g/dL (ref 6.0–8.3)

## 2013-04-04 LAB — URINALYSIS, ROUTINE W REFLEX MICROSCOPIC
Bilirubin Urine: NEGATIVE
Glucose, UA: >1000 mg/dL — AB
Ketones, ur: NEGATIVE mg/dL
Leukocytes, UA: NEGATIVE
Nitrite: NEGATIVE
Protein, ur: NEGATIVE mg/dL
Specific Gravity, Urine: 1.02 (ref 1.005–1.030)

## 2013-04-04 LAB — TROPONIN I: Troponin I: <0.3 ng/mL (ref <0.30)

## 2013-04-04 LAB — CBC WITH DIFFERENTIAL/PLATELET
Eosinophils Absolute: 0 10*3/uL (ref 0.0–0.7)
Eosinophils Relative: 0 % (ref 0–5)
HCT: 38.1 % — ABNORMAL LOW (ref 39.0–52.0)
Hemoglobin: 12.4 g/dL — ABNORMAL LOW (ref 13.0–17.0)
Lymphs Abs: 0.9 10*3/uL (ref 0.7–4.0)
MCH: 25.6 pg — ABNORMAL LOW (ref 26.0–34.0)
MCV: 78.7 fL (ref 78.0–100.0)
Monocytes Absolute: 0.4 10*3/uL (ref 0.1–1.0)
Monocytes Relative: 5 % (ref 3–12)
Platelets: 259 10*3/uL (ref 150–400)
RBC: 4.84 MIL/uL (ref 4.22–5.81)

## 2013-04-04 LAB — LACTIC ACID, PLASMA: Lactic Acid, Venous: 2.1 mmol/L (ref 0.5–2.2)

## 2013-04-04 LAB — URINE MICROSCOPIC-ADD ON

## 2013-04-04 LAB — LIPASE, BLOOD: Lipase: 20 U/L (ref 11–59)

## 2013-04-04 MED ORDER — KETOROLAC TROMETHAMINE 30 MG/ML IJ SOLN
30.0000 mg | Freq: Once | INTRAMUSCULAR | Status: AC
  Administered 2013-04-04: 30 mg via INTRAVENOUS
  Filled 2013-04-04: qty 1

## 2013-04-04 MED ORDER — ONDANSETRON HCL 4 MG PO TABS: 4.0000 mg | ORAL_TABLET | Freq: Four times a day (QID) | ORAL | Status: DC

## 2013-04-04 MED ORDER — IOHEXOL 300 MG/ML  SOLN
100.0000 mL | Freq: Once | INTRAMUSCULAR | Status: AC | PRN
  Administered 2013-04-04: 100 mL via INTRAVENOUS

## 2013-04-04 MED ORDER — OXYCODONE-ACETAMINOPHEN 5-325 MG PO TABS: 2.0000 | ORAL_TABLET | ORAL | Status: DC | PRN

## 2013-04-04 MED ORDER — MORPHINE SULFATE 4 MG/ML IJ SOLN
4.0000 mg | Freq: Once | INTRAMUSCULAR | Status: AC
  Administered 2013-04-04: 4 mg via INTRAVENOUS
  Filled 2013-04-04: qty 1

## 2013-04-04 MED ORDER — ONDANSETRON HCL 4 MG/2ML IJ SOLN
4.0000 mg | Freq: Once | INTRAMUSCULAR | Status: AC
  Administered 2013-04-04: 4 mg via INTRAVENOUS
  Filled 2013-04-04: qty 2

## 2013-04-04 MED ORDER — IBUPROFEN 800 MG PO TABS: 800.0000 mg | ORAL_TABLET | Freq: Three times a day (TID) | ORAL | Status: DC

## 2013-04-04 MED ORDER — SODIUM CHLORIDE 0.9 % IV BOLUS (SEPSIS)
500.0000 mL | Freq: Once | INTRAVENOUS | Status: AC
  Administered 2013-04-04: 500 mL via INTRAVENOUS

## 2013-04-04 MED ORDER — IOHEXOL 300 MG/ML  SOLN
50.0000 mL | Freq: Once | INTRAMUSCULAR | Status: AC | PRN
  Administered 2013-04-04: 50 mL via ORAL

## 2013-04-04 NOTE — ED Notes (Signed)
Pt c/o right side abdominal pain that radiates to right groin and right testicle. Pt states pain began this morning. Describes pain in groin as "sharp shooting" and describes pain in abdomen "dull". Pt denies N/V/D, denies unusual urinary symptoms.

## 2013-04-04 NOTE — ED Provider Notes (Signed)
CSN: [id number]     Arrival date & time 04/04/13  1121 History  This chart was scribed for Steven Octave, MD by Quintella Reichert, ED scribe.  This patient was seen in room APA10/[facility] and the patient's care was started at 1:21 PM.   Chief Complaint  Patient presents with  . Abdominal Pain    The history is provided by the patient. No language interpreter was used.    HPI Comments: Steven Crane is a 52 y.o. male with h/o DM, HTN, hyperlipidemia, MI, and CAD who presents to the Emergency Department complaining of severe sharp abdominal pain that began this morning at around 9:30 AM.  Pt states his pain began in the RUQ and has been moving down to the RLQ with some radiation into the right groin.  It is not aggravated or alleviated by anything.  It is sssociated with diaphoresis.  He denies vomiting, CP, back pain, diarrhea, measured fever, urinary symptoms, bowel symptoms, weakness, numbness or tingling.  Pt had an MI in 2011 and states his current symptoms do not feel similar.  He has had stents placed.  He takes insulin for his DM.  He denies personal h/o kidney stones but notes that he does have a family history.  He is on regular and baby aspirin.   Past Medical History  Diagnosis Date  . Coronary artery disease   . Closed head injury 1975  . Myocardial infarction   . Hypertension   . Hyperlipemia   . Asthma     as a child  . Diabetes mellitus   . GERD (gastroesophageal reflux disease)   . Headache(784.0)   . Arthritis   . Morbid obesity   . Reflux     Chronic  . Hypertriglyceridemia   . Psoriasis   . DVT (deep venous thrombosis)     Past Surgical History  Procedure Laterality Date  . Arthroscopy left knee  1981  . Cardiac catheterization  2011  . Coronary stent placement  Nov  2011  . Colonoscopy  01/06/2012    Procedure: COLONOSCOPY;  Surgeon: Corbin Ade, MD;  Location: [facility];  Service: Endoscopy;  Laterality: N/A;  7:30 AM    Family History  Problem  Relation Age of Onset  . Coronary artery disease Father   . Diabetes type II Father   . Diabetes Father   . Heart attack Father   . Hypertension Mother     History  Substance Use Topics  . Smoking status: Never Smoker   . Smokeless tobacco: Current User    Types: Snuff  . Alcohol Use: Yes     Comment: social     Review of SystemsA complete 10 system review of systems was obtained and all systems are negative except as noted in the HPI and PMH.    Allergies  Adhesive  Home Medications   Current Outpatient Rx  Name  Route  Sig  Dispense  Refill  . acetaminophen (TYLENOL) 500 MG tablet   Oral   Take 1,000 mg by mouth 2 (two) times daily.         Marland Kitchen albuterol (PROVENTIL HFA;VENTOLIN HFA) 108 (90 BASE) MCG/ACT inhaler   Inhalation   Inhale 2 puffs into the lungs every 6 (six) hours as needed for wheezing.   1 Inhaler   5   . ALPRAZolam (XANAX) 0.5 MG tablet   Oral   Take 0.75 mg by mouth daily as needed for anxiety.         Marland Kitchen  aspirin 325 MG tablet   Oral   Take 325 mg by mouth daily. Pt takes one 325 mg ASA in the AM         . aspirin 81 MG tablet   Oral   Take 81 mg by mouth daily. Pt takes one 81 mg ASA in the PM         . enalapril (VASOTEC) 10 MG tablet      TAKE ONE TABLET BY MOUTH ONCE DAILY   30 tablet   2   . fenofibrate 160 MG tablet   Oral   Take 1 tablet (160 mg total) by mouth daily.   30 tablet   5   . glyBURIDE-metformin (GLUCOVANCE) 5-500 MG per tablet      TAKE TWO TABLETS BY MOUTH TWICE DAILY   120 tablet   2   . insulin glargine (LANTUS) 100 UNIT/ML injection   Subcutaneous   Inject 10 Units into the skin at bedtime.         Marland Kitchen omeprazole (PRILOSEC OTC) 20 MG tablet   Oral   Take 1 tablet (20 mg total) by mouth daily.   30 tablet   11   . pravastatin (PRAVACHOL) 40 MG tablet   Oral   Take 1 tablet (40 mg total) by mouth daily.   30 tablet   5   . ibuprofen (ADVIL,MOTRIN) 800 MG tablet   Oral   Take 1 tablet  (800 mg total) by mouth 3 (three) times daily.   21 tablet   0   . ondansetron (ZOFRAN) 4 MG tablet   Oral   Take 1 tablet (4 mg total) by mouth every 6 (six) hours.   12 tablet   0   . oxyCODONE-acetaminophen (PERCOCET/ROXICET) 5-325 MG per tablet   Oral   Take 2 tablets by mouth every 4 (four) hours as needed for severe pain.   15 tablet   0    BP 154/80  Pulse 60  Temp(Src) 97.4 F (36.3 C) (Oral)  Resp 20  Ht 6\' 1"  (1.854 m)  Wt 310 lb (140.615 kg)  BMI 40.91 kg/m2  SpO2 100%  Physical Exam  Nursing note and vitals reviewed. Constitutional: He is oriented to person, place, and time. He appears well-developed and well-nourished. No distress.  Exam difficult due to obesity.  HENT:  Head: Normocephalic and atraumatic.  Eyes: EOM are normal.  Neck: Neck supple. No tracheal deviation present.  Cardiovascular: Normal rate, regular rhythm and normal heart sounds.   No murmur heard. Pulmonary/Chest: Effort normal and breath sounds normal. No respiratory distress. He has no wheezes. He has no rales.  Abdominal: Soft. There is tenderness (RUQ, RLQ). There is guarding (RUQ, RLQ).  Genitourinary:  No testicular tenderness.  No CVA tenderness.  Musculoskeletal: Normal range of motion.  5/5 strength in bilateral lower extremities. Ankle plantar and dorsiflexion intact. Great toe extension intact bilaterally. +2 DP and PT pulses. +2 patellar reflexes bilaterally. Normal gait.  Neurological: He is alert and oriented to person, place, and time.  Skin: Skin is warm and dry.  Psychiatric: He has a normal mood and affect. His behavior is normal.    ED Course  Procedures (including critical care time)  DIAGNOSTIC STUDIES: Oxygen Saturation is 100% on room air, normal by my interpretation.    COORDINATION OF CARE: 1:27 PM-Discussed treatment plan which includes pain medication, anti-emetics, bloodwork and CT abdomen with pt at bedside and pt agreed to plan.    Labs  Review Labs Reviewed  URINALYSIS, ROUTINE W REFLEX MICROSCOPIC - Abnormal; Notable for the following:    Glucose, UA >1000 (*)    Hgb urine dipstick LARGE (*)    All other components within normal limits  CBC WITH DIFFERENTIAL - Abnormal; Notable for the following:    Hemoglobin 12.4 (*)    HCT 38.1 (*)    MCH 25.6 (*)    Neutrophils Relative % 84 (*)    Lymphocytes Relative 11 (*)    All other components within normal limits  COMPREHENSIVE METABOLIC PANEL - Abnormal; Notable for the following:    Sodium 134 (*)    Glucose, Bld 295 (*)    All other components within normal limits  URINE MICROSCOPIC-ADD ON  LIPASE, BLOOD  LACTIC ACID, PLASMA  TROPONIN I    Imaging Review Ct Abdomen Pelvis W Contrast  04/04/2013   CLINICAL DATA:  Right abdominal pain  EXAM: CT ABDOMEN AND PELVIS WITH CONTRAST  TECHNIQUE: Multidetector CT imaging of the abdomen and pelvis was performed using the standard protocol following bolus administration of intravenous contrast.  CONTRAST:  50mL OMNIPAQUE IOHEXOL 300 MG/ML SOLN, OMNIPAQUE IOHEXOL 300 MG/ML SOLN  COMPARISON:  None.  FINDINGS: Lung bases are clear.  Moderate hepatic steatosis.  Unenhanced spleen, pancreas, and adrenal glands are within normal limits.  Gallbladder is unremarkable. No intrahepatic or extrahepatic ductal dilatation.  Two nonobstructing interpolar right renal calculi measuring up to 3 mm (series 2/image 48). Two nonobstructing interpolar left renal calculi measuring up to 4 mm (series 2/ image 41). Mild right hydronephrosis.  No evidence of bowel obstruction.  Normal appendix.  No evidence of abdominal aortic aneurysm.  No abdominopelvic ascites.  No suspicious abdominopelvic lymphadenopathy.  Prostate is unremarkable.  3 mm distal right ureteral calculus at the UVJ (series 2/image 86).  Bladder is underdistended but within normal limits.  Small fat containing periumbilical hernia (series 2/ image 69).  Mild degenerative changes of the  visualized thoracolumbar spine.  IMPRESSION: 3 mm distal right ureteral calculus at the UVJ. Mild right hydronephrosis.  Additional bilateral nonobstructing renal calculi measuring up to 4 mm.  Moderate hepatic steatosis.   Electronically Signed   By: Charline Bills M.D.   On: 04/04/2013 15:05    EKG Interpretation    Date/Time:  Sunday April 04 2013 13:48:21 EST Ventricular Rate:  66 PR Interval:  200 QRS Duration: 114 QT Interval:  418 QTC Calculation: 438 R Axis:   -57 Text Interpretation:  Normal sinus rhythm Left axis deviation Cannot rule out Inferior infarct (cited on or before 15-Mar-2010) T wave abnormality, consider lateral ischemia Abnormal ECG When compared with ECG of 20-Mar-2010 05:24, No significant change was found No significant change was found Confirmed by Manus Gunning  MD, Ronelle Michie ([id number]) on 04/04/2013 2:05:18 PM            MDM   1. Ureterolithiasis    Constant right-sided abdominal pain for the past 5 hours it radiates to the lower abdomen and groin. No urinary symptoms, vomiting or fever.  Tender to palpation the right upper right lower quadrants. UA with large hemoglobin. Hyperglycemia without DKA. LFTs and lipase normal. Hematuria without infection UA.  CT scan shows 3 millimeter distal right-sided stone. Appendix is normal. Gallbladder also appears normal on CT.  Pain is controlled on reevaluation. No tenderness in right upper quadrant or right lower quadrant. Patient tolerating by mouth. Treat with NSAIds, analgesics, followup with urology. Return precautions discussed.  I personally performed the services  described in this documentation, which was scribed in my presence. The recorded information has been reviewed and is accurate.    Steven Octave, MD 04/04/13 [phone]

## 2013-04-04 NOTE — ED Notes (Signed)
Patient reports right upper abd pain that started this morning and now radiates to lower abd and into grion. Per patient felt like he had to have BM but could not. Patient states that he did have a normal BM this morning prior to the pain. Denies any nausea, vomiting, fevers, or urinary symptoms.

## 2013-04-05 ENCOUNTER — Ambulatory Visit (INDEPENDENT_AMBULATORY_CARE_PROVIDER_SITE_OTHER): Payer: BC Managed Care – PPO | Admitting: Family Medicine

## 2013-04-05 ENCOUNTER — Encounter: Payer: Self-pay | Admitting: Family Medicine

## 2013-04-05 ENCOUNTER — Telehealth: Payer: Self-pay | Admitting: Family Medicine

## 2013-04-05 VITALS — BP 132/82 | Temp 98.8°F | Ht 74.0 in | Wt 314.0 lb

## 2013-04-05 DIAGNOSIS — N200 Calculus of kidney: Secondary | ICD-10-CM | POA: Insufficient documentation

## 2013-04-05 NOTE — Telephone Encounter (Signed)
error 

## 2013-04-05 NOTE — Progress Notes (Signed)
   Subjective:    Patient ID: Steven Crane, male    DOB: 1960-06-29, 52 y.o.   MRN: [id number]  Abdominal Pain This is a new problem. The current episode started in the past 7 days. The onset quality is sudden. The problem occurs intermittently. The pain is located in the RLQ. The pain is mild. The quality of the pain is sharp. Nothing aggravates the pain. Treatments tried: pain medications. The treatment provided moderate relief.  Patient was seen at [facility] ER on 04/04/13 for abdominal pain and was diagnosed with kidney stones. Symptoms have improved slightly.   CT done. CT reviewed. It showed multiple stones within the right renal pelvis. Also there was distal obstruction with a 3 mm stone. In addition patient had mild hydro-nephrosis.  Patient was given ibuprofen and oxycodone for home. Also advised to filter his urine. Notes that he urinated several times before this occurred. Was advised to followup with Dr. Jesse Fall which she would rather not do. No current fever no headache no chest pain no abdominal discomfort other than right lateral abdominal tenderness left over. Review of Systems  Gastrointestinal: Positive for abdominal pain.   no chest pain no headache no back pain no change in bowel habits ROS otherwise negative     Objective:   Physical Exam  Alert lungs clear. Heart rare in rhythm. H&T normal. No CVA tenderness. Right lateral abdomen moderate tenderness to deep palpation no rebound no guarding good bowel sounds      Assessment & Plan:  Impression renal lithiasis with partial ureteral obstruction. Clinically much improved. Discussed at length with patient. All the ER notes reviewed. Plan maintain ibuprofen when necessary. Try to avoid oxycodone. Screening urine rationale discussed. Encouraged fluids work excuse given. WSL

## 2013-04-06 NOTE — Addendum Note (Signed)
Addended by: Margaretha Sheffield on: 04/06/2013 12:53 PM   Modules accepted: Orders

## 2013-04-09 LAB — STONE ANALYSIS

## 2013-04-12 ENCOUNTER — Ambulatory Visit: Payer: BC Managed Care – PPO | Admitting: Family Medicine

## 2013-05-21 ENCOUNTER — Ambulatory Visit (INDEPENDENT_AMBULATORY_CARE_PROVIDER_SITE_OTHER): Payer: BC Managed Care – PPO | Admitting: Family Medicine

## 2013-05-21 ENCOUNTER — Encounter: Payer: Self-pay | Admitting: Family Medicine

## 2013-05-21 VITALS — BP 150/90 | Ht 74.0 in | Wt 305.0 lb

## 2013-05-21 DIAGNOSIS — J45909 Unspecified asthma, uncomplicated: Secondary | ICD-10-CM

## 2013-05-21 DIAGNOSIS — E119 Type 2 diabetes mellitus without complications: Secondary | ICD-10-CM

## 2013-05-21 DIAGNOSIS — E785 Hyperlipidemia, unspecified: Secondary | ICD-10-CM

## 2013-05-21 DIAGNOSIS — Z125 Encounter for screening for malignant neoplasm of prostate: Secondary | ICD-10-CM

## 2013-05-21 DIAGNOSIS — I10 Essential (primary) hypertension: Secondary | ICD-10-CM

## 2013-05-21 DIAGNOSIS — I2510 Atherosclerotic heart disease of native coronary artery without angina pectoris: Secondary | ICD-10-CM

## 2013-05-21 DIAGNOSIS — Z79899 Other long term (current) drug therapy: Secondary | ICD-10-CM

## 2013-05-21 DIAGNOSIS — J683 Other acute and subacute respiratory conditions due to chemicals, gases, fumes and vapors: Secondary | ICD-10-CM

## 2013-05-21 LAB — POCT GLYCOSYLATED HEMOGLOBIN (HGB A1C): HEMOGLOBIN A1C: 8.1

## 2013-05-21 MED ORDER — GLYBURIDE-METFORMIN 5-500 MG PO TABS: ORAL_TABLET | ORAL | Status: DC

## 2013-05-21 MED ORDER — ALPRAZOLAM 0.5 MG PO TABS: ORAL_TABLET | ORAL | Status: DC

## 2013-05-21 MED ORDER — ENALAPRIL MALEATE 10 MG PO TABS: ORAL_TABLET | ORAL | Status: DC

## 2013-05-21 NOTE — Patient Instructions (Addendum)
Increase Lantus to 16 units daily!  Diet for Kidney Stones Kidney stones are small, hard masses that form inside your kidneys. They are made up of salts and minerals and often form when high levels build up in the urine. The minerals can then start to build up, crystalize, and stick together to form stones. There are several different types of kidney stones. The following types of stones may be influenced by dietary factors:   Calcium Oxalate Stones. An oxalate is a salt found in certain foods. Within the body, calcium can combine with oxalates to form calcium oxalate stones, which can be excreted in the urine in high amounts. This is the most common type of kidney stone.  Calcium Phosphate Stones. These stones may occur when the pH of the urine becomes too high, or less acidic, from too much calcium being excreted in the urine. The pH is a measure of how acidic or basic a substance is.  Uric Acid Stones. This type of stone occurs when the pH of the urine becomes too low, or very acidic, because substances called purines build up in the urine. Purines are found in animal proteins. When the urine is highly concentrated with acid, uric acid kidney stones can form.  Other risk factors for kidney stones include genetics, environment, and being overweight. Your caregiver may ask you to follow specific diet guidelines based on the type of stone you have to lessen the chances of your body making more kidney stones.  GENERAL GUIDELINES FOR ALL TYPES OF STONES  Drink plenty of fluid. Drink 12 16 cups of fluid a day, drinking mainly water.This is the most important thing you can do to prevent the formation of future kidney stones.  Maintain a healthy weight. Your caregiver or dietitian can help you determine what a healthy weight is for you. If you are overweight, weight loss may help prevent the formation of future kidney stones.  Eat a diet adequate in animal protein. Too much animal protein can  contribute to the formation of stones. Your dietitian can help you determine how much protein you should be eating. Avoid low carbohydrate, high protein diets.  Follow a balanced eating approach. The DASH diet, which stands for "Dietary Approaches to Stop Hypertension," is an effective meal plan for reducing stone formation. This diet is high in fruits, vegetables, dairy, and whole grains and low in animal protein. Ask your caregiver or dietitian for information about the DASH diet. ADDITIONAL DIET GUIDELINES FOR CALCIUM STONES Avoid foods high in salt. This includes table salt, salt seasonings, MSG, soy sauce, cured and processed meats, salted crackers and snack foods, fast food, and canned soups and foods. Ask your caregiver or dietitian for information about reducing sodium in your diet or following the low sodium diet.  Ensure adequate calcium intake. Use the following table for calcium guidelines:  Men 81 years old and younger  1000 mg/day.  Men 49 years old and older  1500 mg/day.  Women 18 53 years old  1000 mg/day.  Women 50 years and older  1500 mg/day. Your dietitian can help you determine if you are getting enough calcium in your diet. Foods that are high in calcium include dairy products, broccoli, cheese, yogurt, and pudding. If you need to take a calcium supplement, take it only in the form of calcium citrate.  Avoid foods high in oxalate. Be sure that any supplements you take do not contain more than 500 mg of vitamin C. Vitamin C is converted  into oxalate in the body. You do not need to avoid fruits and vegetables high in vitamin C.   Grains: High-fiber or bran cereal, whole-wheat bread, grits, barley, buckwheat, amaranth, pretzels, and fruitcake.  Vegetables: Dried beans, wax beans, dark leafy greens, eggplant, leeks, okra, parsley, rutabaga, tomato paste, watercress, zucchini, and escarole.  Fruit: Dried apricots, red currants, figs, kiwi, and rhubarb.  Meat and Meat  Substitutes: Soybeans and foods made from soy (soyburger, miso), dried beans, peanut butter.  Milk: Chocolate milk mixes and soymilk.  Fats and Oils: Nuts (peanuts, almonds, pecans, cashews, hazelnuts) and nut butters, sesame seeds, and tDahini paste.  Condiments/Miscellaneous: Chocolate, carob, marmalade, poppy seeds, instant iced tea, and juice from high-oxalate fruits.  Document Released: 08/03/2010 Document Revised: 10/08/2011 Document Reviewed: 09/23/2011 [facility] Patient Information 2014 [place].

## 2013-05-21 NOTE — Progress Notes (Signed)
   Subjective:    Patient ID: Steven Crane, male    DOB: 1960/11/21, 53 y.o.   MRN: [id number]  HPI Patient is here today for a diabetic check up.  He does not check his blood sugars due to the cost of testing strips. Claims mostly compliance with his medications. No low sugar spells.  Results for orders placed in visit on 05/21/13  POCT GLYCOSYLATED HEMOGLOBIN (HGB A1C)      Result Value Range   Hemoglobin A1C 8.1      Compliant with bp med. Not exercising no obvious side effects with blood pressure medicine.   Asthma is stabe no sig wheezing. No significant smoke exposure this time.  Watching chol med, and mostly watcing diet  Review of Systems No headache no chest pain no back pain no abdominal pain no change about habits no blood in stool ROS otherwise than    Objective:   Physical Exam  Alert HEENT normal. Significant obesity present. Vitals reviewed H&T normal. 138/84 on repeat.. Lungs clear. Heart regular rate and rhythm.      Assessment & Plan:  Impression 1 type 2 diabetes suboptimal control. #2 hypertension good control. #3 morbid obesity discussed. #4 coronary artery disease clinically stable. #5 hyperlipidemia #6 reactive airway stable plan diet exercise discussed in encourage. Add 6 units Lantus to current dose. Encouraged to see eye doctor. Appropriate blood work. Recheck in several months. WSL

## 2013-05-24 LAB — HEPATIC FUNCTION PANEL
ALK PHOS: 42 U/L (ref 39–117)
ALT: 40 U/L (ref 0–53)
AST: 31 U/L (ref 0–37)
Albumin: 3.8 g/dL (ref 3.5–5.2)
BILIRUBIN INDIRECT: 0.3 mg/dL (ref 0.2–1.2)
Bilirubin, Direct: 0.1 mg/dL (ref 0.0–0.3)
Total Bilirubin: 0.4 mg/dL (ref 0.2–1.2)
Total Protein: 6.8 g/dL (ref 6.0–8.3)

## 2013-05-24 LAB — LIPID PANEL
CHOLESTEROL: 153 mg/dL (ref 0–200)
HDL: 35 mg/dL — ABNORMAL LOW (ref >39)
LDL CALC: 86 mg/dL (ref 0–99)
TRIGLYCERIDES: 160 mg/dL — AB (ref <150)
Total CHOL/HDL Ratio: 4.4 Ratio
VLDL: 32 mg/dL (ref 0–40)

## 2013-05-24 LAB — BASIC METABOLIC PANEL
BUN: 14 mg/dL (ref 6–23)
CHLORIDE: 104 meq/L (ref 96–112)
CO2: 25 mEq/L (ref 19–32)
CREATININE: 0.7 mg/dL (ref 0.50–1.35)
Calcium: 9.1 mg/dL (ref 8.4–10.5)
Glucose, Bld: 137 mg/dL — ABNORMAL HIGH (ref 70–99)
Potassium: 4.6 mEq/L (ref 3.5–5.3)
Sodium: 138 mEq/L (ref 135–145)

## 2013-05-25 LAB — MICROALBUMIN, URINE: MICROALB UR: 0.5 mg/dL (ref 0.00–1.89)

## 2013-05-25 LAB — PSA: PSA: 0.24 ng/mL (ref <=4.00)

## 2013-05-30 ENCOUNTER — Encounter: Payer: Self-pay | Admitting: Family Medicine

## 2013-08-06 ENCOUNTER — Emergency Department (HOSPITAL_COMMUNITY): Payer: BC Managed Care – PPO

## 2013-08-06 ENCOUNTER — Telehealth: Payer: Self-pay | Admitting: Family Medicine

## 2013-08-06 ENCOUNTER — Encounter (HOSPITAL_COMMUNITY): Payer: Self-pay | Admitting: Emergency Medicine

## 2013-08-06 ENCOUNTER — Observation Stay (HOSPITAL_COMMUNITY)
Admission: EM | Admit: 2013-08-06 | Discharge: 2013-08-07 | Disposition: A | Discharge status: Home / Self Care | Payer: BC Managed Care – PPO | Attending: Family Medicine | Admitting: Family Medicine

## 2013-08-06 DIAGNOSIS — E785 Hyperlipidemia, unspecified: Secondary | ICD-10-CM | POA: Insufficient documentation

## 2013-08-06 DIAGNOSIS — K219 Gastro-esophageal reflux disease without esophagitis: Secondary | ICD-10-CM | POA: Insufficient documentation

## 2013-08-06 DIAGNOSIS — Z7982 Long term (current) use of aspirin: Secondary | ICD-10-CM | POA: Insufficient documentation

## 2013-08-06 DIAGNOSIS — L409 Psoriasis, unspecified: Secondary | ICD-10-CM

## 2013-08-06 DIAGNOSIS — R079 Chest pain, unspecified: Principal | ICD-10-CM

## 2013-08-06 DIAGNOSIS — I252 Old myocardial infarction: Secondary | ICD-10-CM | POA: Insufficient documentation

## 2013-08-06 DIAGNOSIS — R002 Palpitations: Secondary | ICD-10-CM | POA: Insufficient documentation

## 2013-08-06 DIAGNOSIS — I10 Essential (primary) hypertension: Secondary | ICD-10-CM

## 2013-08-06 DIAGNOSIS — E119 Type 2 diabetes mellitus without complications: Secondary | ICD-10-CM

## 2013-08-06 DIAGNOSIS — Z86718 Personal history of other venous thrombosis and embolism: Secondary | ICD-10-CM | POA: Insufficient documentation

## 2013-08-06 DIAGNOSIS — L408 Other psoriasis: Secondary | ICD-10-CM | POA: Insufficient documentation

## 2013-08-06 DIAGNOSIS — Z794 Long term (current) use of insulin: Secondary | ICD-10-CM | POA: Insufficient documentation

## 2013-08-06 DIAGNOSIS — Z9861 Coronary angioplasty status: Secondary | ICD-10-CM | POA: Insufficient documentation

## 2013-08-06 DIAGNOSIS — Z6838 Body mass index (BMI) 38.0-38.9, adult: Secondary | ICD-10-CM | POA: Insufficient documentation

## 2013-08-06 DIAGNOSIS — I2510 Atherosclerotic heart disease of native coronary artery without angina pectoris: Secondary | ICD-10-CM

## 2013-08-06 LAB — BASIC METABOLIC PANEL
BUN: 13 mg/dL (ref 6–23)
CHLORIDE: 99 meq/L (ref 96–112)
CO2: 24 mEq/L (ref 19–32)
Calcium: 8.8 mg/dL (ref 8.4–10.5)
Creatinine, Ser: 0.69 mg/dL (ref 0.50–1.35)
GFR calc non Af Amer: >90 mL/min (ref >90)
Glucose, Bld: 197 mg/dL — ABNORMAL HIGH (ref 70–99)
POTASSIUM: 4 meq/L (ref 3.7–5.3)
Sodium: 135 mEq/L — ABNORMAL LOW (ref 137–147)

## 2013-08-06 LAB — CBC WITH DIFFERENTIAL/PLATELET
BASOS ABS: 0 10*3/uL (ref 0.0–0.1)
Basophils Relative: 0 % (ref 0–1)
Eosinophils Absolute: 0.1 10*3/uL (ref 0.0–0.7)
Eosinophils Relative: 1 % (ref 0–5)
HCT: 37 % — ABNORMAL LOW (ref 39.0–52.0)
HEMOGLOBIN: 12.3 g/dL — AB (ref 13.0–17.0)
Lymphocytes Relative: 24 % (ref 12–46)
Lymphs Abs: 1.5 10*3/uL (ref 0.7–4.0)
MCH: 26.1 pg (ref 26.0–34.0)
MCHC: 33.2 g/dL (ref 30.0–36.0)
MCV: 78.6 fL (ref 78.0–100.0)
MONOS PCT: 10 % (ref 3–12)
Monocytes Absolute: 0.6 10*3/uL (ref 0.1–1.0)
NEUTROS ABS: 4.1 10*3/uL (ref 1.7–7.7)
Neutrophils Relative %: 65 % (ref 43–77)
PLATELETS: 230 10*3/uL (ref 150–400)
RBC: 4.71 MIL/uL (ref 4.22–5.81)
RDW: 14.5 % (ref 11.5–15.5)
WBC: 6.3 10*3/uL (ref 4.0–10.5)

## 2013-08-06 LAB — PROTIME-INR
INR: 1.01 (ref 0.00–1.49)
PROTHROMBIN TIME: 13.1 s (ref 11.6–15.2)

## 2013-08-06 LAB — PRO B NATRIURETIC PEPTIDE: Pro B Natriuretic peptide (BNP): 134.6 pg/mL — ABNORMAL HIGH (ref 0–125)

## 2013-08-06 LAB — TROPONIN I: Troponin I: <0.3 ng/mL (ref <0.30)

## 2013-08-06 LAB — GLUCOSE, CAPILLARY: Glucose-Capillary: 92 mg/dL (ref 70–99)

## 2013-08-06 MED ORDER — ALBUTEROL SULFATE (2.5 MG/3ML) 0.083% IN NEBU: 3.0000 mL | INHALATION_SOLUTION | Freq: Four times a day (QID) | RESPIRATORY_TRACT | Status: DC | PRN

## 2013-08-06 MED ORDER — ACETAMINOPHEN 500 MG PO TABS
1000.0000 mg | ORAL_TABLET | Freq: Two times a day (BID) | ORAL | Status: DC
  Administered 2013-08-06 – 2013-08-07 (×2): 1000 mg via ORAL
  Filled 2013-08-06 (×2): qty 2

## 2013-08-06 MED ORDER — ASPIRIN EC 81 MG PO TBEC
81.0000 mg | DELAYED_RELEASE_TABLET | Freq: Every evening | ORAL | Status: DC
  Administered 2013-08-06: 81 mg via ORAL
  Filled 2013-08-06: qty 1

## 2013-08-06 MED ORDER — ENOXAPARIN SODIUM 150 MG/ML ~~LOC~~ SOLN
SUBCUTANEOUS | Status: AC
  Filled 2013-08-06: qty 1

## 2013-08-06 MED ORDER — ALPRAZOLAM 0.25 MG PO TABS: 0.2500 mg | ORAL_TABLET | Freq: Every day | ORAL | Status: DC | PRN

## 2013-08-06 MED ORDER — INSULIN ASPART 100 UNIT/ML ~~LOC~~ SOLN
0.0000 [IU] | Freq: Three times a day (TID) | SUBCUTANEOUS | Status: DC
  Administered 2013-08-07: 2 [IU] via SUBCUTANEOUS
  Administered 2013-08-07: 3 [IU] via SUBCUTANEOUS

## 2013-08-06 MED ORDER — INSULIN GLARGINE 100 UNIT/ML ~~LOC~~ SOLN
15.0000 [IU] | Freq: Every day | SUBCUTANEOUS | Status: DC
Start: 2013-08-06 — End: 2013-08-07
  Filled 2013-08-06 (×3): qty 0.15

## 2013-08-06 MED ORDER — FENOFIBRATE 160 MG PO TABS
160.0000 mg | ORAL_TABLET | Freq: Every day | ORAL | Status: DC
  Administered 2013-08-07: 160 mg via ORAL
  Filled 2013-08-06 (×3): qty 1

## 2013-08-06 MED ORDER — GI COCKTAIL ~~LOC~~: 30.0000 mL | Freq: Four times a day (QID) | ORAL | Status: DC | PRN

## 2013-08-06 MED ORDER — ENALAPRIL MALEATE 5 MG PO TABS
10.0000 mg | ORAL_TABLET | Freq: Every day | ORAL | Status: DC
  Administered 2013-08-07: 10 mg via ORAL
  Filled 2013-08-06: qty 2

## 2013-08-06 MED ORDER — ENOXAPARIN SODIUM 150 MG/ML ~~LOC~~ SOLN
1.0000 mg/kg | Freq: Two times a day (BID) | SUBCUTANEOUS | Status: DC
  Administered 2013-08-07 (×2): 135 mg via SUBCUTANEOUS
  Filled 2013-08-06 (×6): qty 1

## 2013-08-06 NOTE — Telephone Encounter (Signed)
yer right

## 2013-08-06 NOTE — Telephone Encounter (Signed)
Patient was advised to go straight to ER for Cardiac Evaluation and Cardiac Enzymes. Patient verbalized understanding.

## 2013-08-06 NOTE — H&P (Signed)
PCP:   [name], MD   Chief Complaint:  cp  HPI: 53 yo male h/o CAD s/p stent in 2011, obesity, htn, hld, niddm, GERD, comes in with 2 separate episodes today of sscp.  First was early this am, he has been working in his yard shoveling.  He got sscp that was burning with no radition then he had this fluttering feeling.  No n/v.  He sat down and rested and drank some water and the pain resolved.  Later in the day, he was back to working in his yard, and the same thing happened except this time it was sharper, and again had fluttering sensation.  Again resolved with drinking water, resting and he took some aspirin.  The first episode lasted about ten minutes, the second time last about 20 minutes.  He called is pcp office who advised him to come to the ED.  He has chronic swelling in his legs, this is not changed.  No fevers.  No cough.  When he had his heart attack in 2011, he was having left jaw pain, no cp then.  He has not had any stress testing since his cath in 2011.  He has lost over 130lbs in the last 4 years with diet and exercise per pt report.  His last hga1c was 8.1% which is about 1% better than his previous.  He is compliant with his medications.  No cp since prior to arrival to ED.  Review of Systems:  Positive and negative as per HPI otherwise all other systems are negative  Past Medical History: Past Medical History  Diagnosis Date  . Coronary artery disease   . Closed head injury 1975  . Myocardial infarction   . Hypertension   . Hyperlipemia   . Asthma     as a child  . Diabetes mellitus   . GERD (gastroesophageal reflux disease)   . Headache(784.0)   . Arthritis   . Morbid obesity   . Reflux     Chronic  . Hypertriglyceridemia   . Psoriasis   . DVT (deep venous thrombosis)    Past Surgical History  Procedure Laterality Date  . Arthroscopy left knee  1981  . Cardiac catheterization  2011  . Coronary stent placement  Nov  2011  . Colonoscopy  01/06/2012   Procedure: COLONOSCOPY;  Surgeon: [name], MD;  Location: [facility];  Service: Endoscopy;  Laterality: N/A;  7:30 AM    Medications: Prior to Admission medications   Medication Sig Start Date End Date Taking? Authorizing Provider  acetaminophen (TYLENOL) 500 MG tablet Take 1,000 mg by mouth 2 (two) times daily.   Yes Historical Provider, MD  ALPRAZolam Duanne Moron) 0.5 MG tablet Take 0.25-0.5 mg by mouth daily as needed for anxiety (*Normally takes at bedtime but will take during the day as needed).   Yes Historical Provider, MD  aspirin 325 MG tablet Take 325 mg by mouth every morning.    Yes Historical Provider, MD  aspirin EC 81 MG tablet Take 81 mg by mouth every evening.   Yes Historical Provider, MD  enalapril (VASOTEC) 10 MG tablet Take 10 mg by mouth daily.   Yes Historical Provider, MD  fenofibrate 160 MG tablet Take 1 tablet (160 mg total) by mouth daily. 02/12/13  Yes [name], MD  glyBURIDE-metformin (GLUCOVANCE) 5-500 MG per tablet Take 2 tablets by mouth 2 (two) times daily.   Yes Historical Provider, MD  insulin glargine (LANTUS) 100 UNIT/ML  injection Inject 15 Units into the skin at bedtime.    Yes Historical Provider, MD  omeprazole (PRILOSEC OTC) 20 MG tablet Take 1 tablet (20 mg total) by mouth daily. 02/12/13  Yes [name], MD  pravastatin (PRAVACHOL) 40 MG tablet Take 1 tablet (40 mg total) by mouth daily. 02/12/13  Yes [name], MD  albuterol (PROVENTIL HFA;VENTOLIN HFA) 108 (90 BASE) MCG/ACT inhaler Inhale 2 puffs into the lungs every 6 (six) hours as needed for wheezing. 02/12/13   [name], MD    Allergies:   Allergies  Allergen Reactions  . Adhesive [Tape] Rash    Social History:  reports that he has never smoked. His smokeless tobacco use includes Snuff. He reports that he drinks alcohol. He reports that he does not use illicit drugs.  Family History: Family History  Problem Relation Age of Onset  . Coronary artery  disease Father   . Diabetes type II Father   . Diabetes Father   . Heart attack Father   . Hypertension Mother     Physical Exam: Filed Vitals:   08/06/13 1719 08/06/13 1800 08/06/13 1832 08/06/13 1900  BP: 145/81 119/96 119/96 124/70  Pulse: 89 83 77 75  Temp: 98.3 F (36.8 C)     TempSrc: Oral     Resp: 15 14 12 21   Height:      Weight:      SpO2: 96% 96% 96% 95%   General appearance: alert, cooperative and no distress Head: Normocephalic, without obvious abnormality, atraumatic Eyes: negative Nose: Nares normal. Septum midline. Mucosa normal. No drainage or sinus tenderness. Neck: no JVD and supple, symmetrical, trachea midline Lungs: clear to auscultation bilaterally Heart: regular rate and rhythm, S1, S2 normal, no murmur, click, rub or gallop Abdomen: soft, non-tender; bowel sounds normal; no masses,  no organomegaly Extremities: extremities normal, atraumatic, no cyanosis or edema Pulses: 2+ and symmetric Skin: Skin color, texture, turgor normal. No rashes or lesions Neurologic: Grossly normal    Labs on Admission:   Recent Labs  08/06/13 1730  NA 135*  K 4.0  CL 99  CO2 24  GLUCOSE 197*  BUN 13  CREATININE 0.69  CALCIUM 8.8    Recent Labs  08/06/13 1730  WBC 6.3  NEUTROABS 4.1  HGB 12.3*  HCT 37.0*  MCV 78.6  PLT 230    Recent Labs  08/06/13 1730  TROPONINI <0.30   Radiological Exams on Admission: Dg Chest 2 View  08/06/2013   CLINICAL DATA:  Chest pain.  EXAM: CHEST  2 VIEW  COMPARISON:  DG CHEST 1V PORT dated 03/13/2010  FINDINGS: Trachea is midline. Heart size normal. There may be mildly asymmetric right suprahilar opacification, not definitely seen on the prior examination although the prior study was apical lordotic in technique. Lungs are otherwise clear. No pleural fluid.  IMPRESSION: Difficult to exclude asymmetric right suprahilar opacification. Nonemergent CT chest without contrast could be performed in further evaluation, as  clinically indicated.   Electronically Signed   By: Lorin Picket M.D.   On: 08/06/2013 18:47    Assessment/Plan  53 yo male with atypical cp with multiple risk factors for ACS  Principal Problem:   Chest pain-  Cover with full dose lovenox.  Asa.  Romi.  Echo in am.  ntg if pain recurs.  Sounds like possible arrythmia, none on ekg/monitor since here.  May benefit from [facility] at discharge for longer cardiac monitoring as outpt.  Active Problems:  Stable  unless o/w noted   Other and unspecified hyperlipidemia- on statin   Type II or unspecified type diabetes mellitus without mention of complication, not stated as uncontrolled  -ssi, hold metformin combo pill.   Essential hypertension, benign   Morbid obesity-  Lost over 130 lbs over last several years!  Excellent!   Psoriasis   CAD S/P percutaneous coronary angioplasty 2011  obs on tele.  Full code.  Tanazia Achee A Shanon Brow 08/06/2013, 7:56 PM

## 2013-08-06 NOTE — Progress Notes (Signed)
ANTICOAGULATION CONSULT NOTE - Initial Consult  Pharmacy Consult for Lovenox Indication: chest pain/ACS  Allergies  Allergen Reactions  . Adhesive [Tape] Rash    Patient Measurements: Height: 6\' 2"  (188 cm) Weight: 302 lb (136.986 kg) IBW/kg (Calculated) : 82.2  Vital Signs: Temp: 98.3 F (36.8 C) (04/17 1719) Temp src: Oral (04/17 1719) BP: 133/68 mmHg (04/17 2100) Pulse Rate: 74 (04/17 2100)  Labs:  Recent Labs  08/06/13 1730  HGB 12.3*  HCT 37.0*  PLT 230  CREATININE 0.69  TROPONINI <0.30    Estimated Creatinine Clearance: 159 ml/min (by C-G formula based on Cr of 0.69).   Medical History: Past Medical History  Diagnosis Date  . Coronary artery disease   . Closed head injury 1975  . Myocardial infarction   . Hypertension   . Hyperlipemia   . Asthma     as a child  . Diabetes mellitus   . GERD (gastroesophageal reflux disease)   . Headache(784.0)   . Arthritis   . Morbid obesity   . Reflux     Chronic  . Hypertriglyceridemia   . Psoriasis   . DVT (deep venous thrombosis)    Medications:  Prescriptions prior to admission  Medication Sig Dispense Refill  . acetaminophen (TYLENOL) 500 MG tablet Take 1,000 mg by mouth 2 (two) times daily.      Marland Kitchen ALPRAZolam (XANAX) 0.5 MG tablet Take 0.25-0.5 mg by mouth daily as needed for anxiety (*Normally takes at bedtime but will take during the day as needed).      Marland Kitchen aspirin 325 MG tablet Take 325 mg by mouth every morning.       Marland Kitchen aspirin EC 81 MG tablet Take 81 mg by mouth every evening.      . enalapril (VASOTEC) 10 MG tablet Take 10 mg by mouth daily.      . fenofibrate 160 MG tablet Take 1 tablet (160 mg total) by mouth daily.  30 tablet  5  . glyBURIDE-metformin (GLUCOVANCE) 5-500 MG per tablet Take 2 tablets by mouth 2 (two) times daily.      . insulin glargine (LANTUS) 100 UNIT/ML injection Inject 15 Units into the skin at bedtime.       Marland Kitchen omeprazole (PRILOSEC OTC) 20 MG tablet Take 1 tablet (20 mg  total) by mouth daily.  30 tablet  11  . pravastatin (PRAVACHOL) 40 MG tablet Take 1 tablet (40 mg total) by mouth daily.  30 tablet  5  . albuterol (PROVENTIL HFA;VENTOLIN HFA) 108 (90 BASE) MCG/ACT inhaler Inhale 2 puffs into the lungs every 6 (six) hours as needed for wheezing.  1 Inhaler  5    Assessment: Okay for Protocol, baseline anticoag labs ordered.  Goal of Therapy:  Anti-Xa level 0.6-1 units/ml 4hrs after LMWH dose given if clinically indicated. Monitor platelets by anticoagulation protocol: Yes   Plan:  Lovenox 1mg /kg SQ every 12 hours. CBC in AM, then 3 x week. Baseline anticoag labs.  Pricilla Larsson 08/06/2013,10:10 PM

## 2013-08-06 NOTE — Telephone Encounter (Signed)
Patient said that he was at work about 2:30 pm today and his heart started fluttering and they called the ambulance and they hooked him up on an EKG and said that everything was fine. They advised him that he needed to go have blood work done to make sure he had not had heart attack. Please advise on what we should do.

## 2013-08-06 NOTE — ED Provider Notes (Signed)
CSN: [id number]     Arrival date & time 08/06/13  1701 History   First MD Initiated Contact with Patient 08/06/13 1708     Chief Complaint  Patient presents with  . Chest Pain     (Consider location/radiation/quality/duration/timing/severity/associated sxs/prior Treatment) HPI Comments: Patient presents to ER for evaluation of chest pain. Patient reports that he had onset of chest pain after shoveling for some time outside in the heat. He took 650 mg of aspirin. Pain was severe for approximately 15 or 20 minutes, then resolved. He does report a fluttering sensation in his chest associated with pain. At time of arrival he denies any current chest pain. Patient does have a cardiac history, reports stenting in 2011.  Patient is a 53 y.o. male presenting with chest pain.  Chest Pain   Past Medical History  Diagnosis Date  . Coronary artery disease   . Closed head injury 1975  . Myocardial infarction   . Hypertension   . Hyperlipemia   . Asthma     as a child  . Diabetes mellitus   . GERD (gastroesophageal reflux disease)   . Headache(784.0)   . Arthritis   . Morbid obesity   . Reflux     Chronic  . Hypertriglyceridemia   . Psoriasis   . DVT (deep venous thrombosis)    Past Surgical History  Procedure Laterality Date  . Arthroscopy left knee  1981  . Cardiac catheterization  2011  . Coronary stent placement  Nov  2011  . Colonoscopy  01/06/2012    Procedure: COLONOSCOPY;  Surgeon: [name], MD;  Location: [facility];  Service: Endoscopy;  Laterality: N/A;  7:30 AM   Family History  Problem Relation Age of Onset  . Coronary artery disease Father   . Diabetes type II Father   . Diabetes Father   . Heart attack Father   . Hypertension Mother    History  Substance Use Topics  . Smoking status: Never Smoker   . Smokeless tobacco: Current User    Types: Snuff  . Alcohol Use: Yes     Comment: social    Review of Systems  Cardiovascular: Positive for chest  pain.  All other systems reviewed and are negative.     Allergies  Adhesive  Home Medications   Prior to Admission medications   Medication Sig Start Date End Date Taking? Authorizing Provider  acetaminophen (TYLENOL) 500 MG tablet Take 1,000 mg by mouth 2 (two) times daily.   Yes Historical Provider, MD  ALPRAZolam Duanne Moron) 0.5 MG tablet Take 0.25-0.5 mg by mouth daily as needed for anxiety (*Normally takes at bedtime but will take during the day as needed).   Yes Historical Provider, MD  aspirin 325 MG tablet Take 325 mg by mouth every morning.    Yes Historical Provider, MD  aspirin EC 81 MG tablet Take 81 mg by mouth every evening.   Yes Historical Provider, MD  enalapril (VASOTEC) 10 MG tablet Take 10 mg by mouth daily.   Yes Historical Provider, MD  fenofibrate 160 MG tablet Take 1 tablet (160 mg total) by mouth daily. 02/12/13  Yes [name], MD  glyBURIDE-metformin (GLUCOVANCE) 5-500 MG per tablet Take 2 tablets by mouth 2 (two) times daily.   Yes Historical Provider, MD  insulin glargine (LANTUS) 100 UNIT/ML injection Inject 15 Units into the skin at bedtime.    Yes Historical Provider, MD  omeprazole (PRILOSEC OTC) 20 MG tablet Take 1 tablet (  20 mg total) by mouth daily. 02/12/13  Yes [name], MD  pravastatin (PRAVACHOL) 40 MG tablet Take 1 tablet (40 mg total) by mouth daily. 02/12/13  Yes [name], MD  albuterol (PROVENTIL HFA;VENTOLIN HFA) 108 (90 BASE) MCG/ACT inhaler Inhale 2 puffs into the lungs every 6 (six) hours as needed for wheezing. 02/12/13   [name], MD   BP 124/70  Pulse 75  Temp(Src) 98.3 F (36.8 C) (Oral)  Resp 21  Ht 6\' 2"  (1.88 m)  Wt 302 lb (136.986 kg)  BMI 38.76 kg/m2  SpO2 95% Physical Exam  Constitutional: He is oriented to person, place, and time. He appears well-developed and well-nourished. No distress.  HENT:  Head: Normocephalic and atraumatic.  Right Ear: Hearing normal.  Left Ear: Hearing normal.  Nose:  Nose normal.  Mouth/Throat: Oropharynx is clear and moist and mucous membranes are normal.  Eyes: Conjunctivae and EOM are normal. Pupils are equal, round, and reactive to light.  Neck: Normal range of motion. Neck supple.  Cardiovascular: Regular rhythm, S1 normal and S2 normal.  Exam reveals no gallop and no friction rub.   No murmur heard. Pulmonary/Chest: Effort normal and breath sounds normal. No respiratory distress. He exhibits no tenderness.  Abdominal: Soft. Normal appearance and bowel sounds are normal. There is no hepatosplenomegaly. There is no tenderness. There is no rebound, no guarding, no tenderness at McBurney's point and negative Murphy's sign. No hernia.  Musculoskeletal: Normal range of motion.  Neurological: He is alert and oriented to person, place, and time. He has normal strength. No cranial nerve deficit or sensory deficit. Coordination normal. GCS eye subscore is 4. GCS verbal subscore is 5. GCS motor subscore is 6.  Skin: Skin is warm, dry and intact. No rash noted. No cyanosis.  Psychiatric: He has a normal mood and affect. His speech is normal and behavior is normal. Thought content normal.    ED Course  Procedures (including critical care time) Labs Review Labs Reviewed  CBC WITH DIFFERENTIAL - Abnormal; Notable for the following:    Hemoglobin 12.3 (*)    HCT 37.0 (*)    All other components within normal limits  BASIC METABOLIC PANEL - Abnormal; Notable for the following:    Sodium 135 (*)    Glucose, Bld 197 (*)    All other components within normal limits  PRO B NATRIURETIC PEPTIDE - Abnormal; Notable for the following:    Pro B Natriuretic peptide (BNP) 134.6 (*)    All other components within normal limits  TROPONIN I    Imaging Review Dg Chest 2 View  08/06/2013   CLINICAL DATA:  Chest pain.  EXAM: CHEST  2 VIEW  COMPARISON:  DG CHEST 1V PORT dated 03/13/2010  FINDINGS: Trachea is midline. Heart size normal. There may be mildly asymmetric right  suprahilar opacification, not definitely seen on the prior examination although the prior study was apical lordotic in technique. Lungs are otherwise clear. No pleural fluid.  IMPRESSION: Difficult to exclude asymmetric right suprahilar opacification. Nonemergent CT chest without contrast could be performed in further evaluation, as clinically indicated.   Electronically Signed   By: Lorin Picket M.D.   On: 08/06/2013 18:47     EKG Interpretation   Date/Time:  Friday August 06 2013 17:08:50 EDT Ventricular Rate:  85 PR Interval:  198 QRS Duration: 112 QT Interval:  370 QTC Calculation: 440 R Axis:   -36 Text Interpretation:  Normal sinus rhythm Left axis deviation  Inferior  infarct (cited on or before 15-Mar-2010) Abnormal ECG When compared with  ECG of 04-Apr-2013 13:48, Questionable change in initial forces of  Inferior leads T wave inversion no longer evident in Lateral leads  Confirmed by [facility]  MD, CHRISTOPHER [id number]) on 08/06/2013 6:26:38 PM      Date: 08/06/2013  Rate: 85  Rhythm: normal sinus rhythm  QRS Axis: left  Intervals: normal  ST/T Wave abnormalities: normal  Conduction Disutrbances:none  Narrative Interpretation:   Old EKG Reviewed: unchanged    MDM   Final diagnoses:  None   Patient presents to ER for evaluation of chest pain. Patient had onset of pain in the substernal and left-sided his chest prior to arriving in the ER. He had been exerting himself prior to onset of the pain, but had recently finished work and was getting into the truck when the pain started. He had fluttering sensation associated with the pain.  Patient does have a history of heart disease, diabetes. He has had a previous MI. He does, however, report that with the MI he had pain in the jaw and neck region, not chest pain. His pain spontaneously resolved prior to arrival. He has not had any recurrence. Cardiac workup is negative.  Based on the patient's multiple cardiac risk factors  with known coronary artery disease, I recommended observation overnight for cardiac rule out.   Orpah Greek, MD 08/06/13 1949

## 2013-08-06 NOTE — ED Notes (Signed)
Complain of chest pain that started this afternoon. States EMS came out and evaluated him and did an EKG and said it looked ok. Pt states his pain was 10/10 at the time and he took two 325 mg ASA. Denies pain at present

## 2013-08-07 LAB — TROPONIN I
Troponin I: <0.3 ng/mL (ref <0.30)
Troponin I: <0.3 ng/mL (ref <0.30)

## 2013-08-07 LAB — CBC
HEMATOCRIT: 36.2 % — AB (ref 39.0–52.0)
Hemoglobin: 11.9 g/dL — ABNORMAL LOW (ref 13.0–17.0)
MCH: 26.2 pg (ref 26.0–34.0)
MCHC: 32.9 g/dL (ref 30.0–36.0)
MCV: 79.6 fL (ref 78.0–100.0)
PLATELETS: 234 10*3/uL (ref 150–400)
RBC: 4.55 MIL/uL (ref 4.22–5.81)
RDW: 14.8 % (ref 11.5–15.5)
WBC: 5.4 10*3/uL (ref 4.0–10.5)

## 2013-08-07 LAB — GLUCOSE, CAPILLARY
Glucose-Capillary: 196 mg/dL — ABNORMAL HIGH (ref 70–99)
Glucose-Capillary: 219 mg/dL — ABNORMAL HIGH (ref 70–99)
Glucose-Capillary: 192 mg/dL — ABNORMAL HIGH (ref 70–99)

## 2013-08-07 MED ORDER — NITROGLYCERIN 0.4 MG SL SUBL: 0.4000 mg | SUBLINGUAL_TABLET | SUBLINGUAL | Status: DC | PRN

## 2013-08-07 MED ORDER — NITROGLYCERIN 0.4 MG SL SUBL
0.4000 mg | SUBLINGUAL_TABLET | SUBLINGUAL | Status: DC | PRN
Start: 2013-08-07 — End: 2014-10-21

## 2013-08-07 NOTE — Plan of Care (Signed)
Problem: Discharge Progression Outcomes Goal: Activity appropriate for discharge plan Outcome: Completed/Met Date Met:  08/07/13 ambulatory

## 2013-08-07 NOTE — Progress Notes (Signed)
PROGRESS NOTE  Steven Crane WLN:[id number] DOB: 02-21-1961 DOA: 08/06/2013 PCP: [name], MD  Summary: 53 year old man with history of coronary artery disease status post stent 2011, diabetes mellitus, hypertension presented with history of 2 episodes of chest pain associated with exertion, relieved with rest. Associated palpitations. Chest pain free in the emergency department, initial troponin negative, EKG normal sinus rhythm, inferior infarct old, left axis deviation, no acute changes compared to previous study 04/05/19 for pain.  Assessment/Plan: 1. Chest pain. Typical features, associated with exertion. However no recurrence since presentation. Has physically intense job with no recent symptoms of CP or SOB. Cardiac enzymes, telemetry unremarkable at this point. Treat empirically on admission with Lovenox, aspirin.  2. Palpitations. Monitor for arrythmia. Consider outpatient monitor. Check TSH. Telemetry unremarkable. 3. CAD with MI s/p PCA 2011. On ASA. [place] 02/2010 "total occlusion of the large marginal branch of the right coronary artery and severe disease of the left anterior descending coronary artery.  He underwent a nuclear stress test that showed reversible ischemia in the anterolateral wall and fixed defect in inferoapical area, hence the patient underwent drug-eluting stent placement in left anterior descending coronary artery along with a balloon angioplasty of obtuse marginal branch 2." Continue enalapril. Not on beta blocker as an outpatient. 4. DM type 2 maintained on oral meds, Lantus. Stable.  5. HTN. Stable. Continue enalapril. 6. Hyperlipidemia. LDL 86, 05/2013. Continue statin. 7. Possible asymmetric right suprahilar opacification. Completely asymptomatic. Doubt clinically significant. Consider outpatient CT and followup.   Continue observation on telemetry, cardiac enzymes. No recurrence of chest pain, plan observation at this point. If no recurrence of pain, anticipate  discharge next 24 hours with outpatient followup with cardiology next week for further evaluation. His history and clinical findings do not suggest ACS at this time, if rules out will d/c Lovenox.  Followup echocardiogram   Consider beta-blocker if has recurrent pain  Outpatient monitoring could be considered.  TSH  Code Status: full code DVT prophylaxis: Lovenox Family Communication:  Disposition Plan: home  Murray Hodgkins, MD  [facility]  Pager [phone] If 7PM-7AM, please contact night-coverage at [email or web address], password [facility] 08/07/2013, 8:07 AM  LOS: 1 day   Consultants:    Procedures:  Echocardiogram  Antibiotics:    HPI/Subjective: No chest pain since admission, no shortness of breath, no palpitations, no complaints.  Objective: Filed Vitals:   08/06/13 2000 08/06/13 2100 08/06/13 2205 08/07/13 0605  BP: 130/68 133/68 127/78 136/77  Pulse: 77 74 74 70  Temp:   97.5 F (36.4 C) 98 F (36.7 C)  TempSrc:   Oral Oral  Resp: 12 19 20 18   Height:      Weight:      SpO2: 96% 97% 95% 97%   No intake or output data in the 24 hours ending 08/07/13 0807   Filed Weights   08/06/13 1714  Weight: 136.986 kg (302 lb)    Exam:   Afebrile, vital signs stable. No hypoxia.  Gen. Appears calm, comfortable.  Psychiatric. Grossly normal mood and affect. Speech fluent and appropriate.  Cardiovascular. Regular rate and rhythm. No murmur, rub or gallop. No lower extremity edema.  Respiratory clear to auscultation bilaterally. No wheezes, rales or rhonchi. Normal respiratory effort.  Data Reviewed:  Blood sugars stable.  Basic metabolic panel unremarkable.  Troponins negative thus far  Mild normocytic anemia, stable. CBC otherwise unremarkable.  EKG on admission normal sinus rhythm, left axis deviation, inferior, and, ALT. Compared to previous date 04/04/2013, no acute  changes seen.  Scheduled Meds: . acetaminophen  1,000 mg Oral BID  . aspirin  EC  81 mg Oral QPM  . enalapril  10 mg Oral Daily  . enoxaparin (LOVENOX) injection  1 mg/kg Subcutaneous Q12H  . fenofibrate  160 mg Oral Daily  . insulin aspart  0-9 Units Subcutaneous TID WC  . insulin glargine  15 Units Subcutaneous QHS   Continuous Infusions:   Principal Problem:   Chest pain Active Problems:   Other and unspecified hyperlipidemia   Type II or unspecified type diabetes mellitus without mention of complication, not stated as uncontrolled   Essential hypertension, benign   Morbid obesity   Psoriasis   CAD S/P percutaneous coronary angioplasty 2011   Time spent 25 minutes

## 2013-08-07 NOTE — Plan of Care (Signed)
Problem: Phase I Progression Outcomes Goal: Pain controlled with appropriate interventions Outcome: Completed/Met Date Met:  08/07/13 Denies chest pain     

## 2013-08-07 NOTE — Discharge Summary (Signed)
Physician Discharge Summary  Steven Crane:[id number] DOB: [birth year] DOA: 08/06/2013  PCP: Steven Battiest, MD  Admit date: 08/06/2013 Discharge date: 08/07/2013  Recommendations for Outpatient Follow-up:  1. Chest pain with some typical features. Cardiology followup will be arranged for next week. 2. Consider outpatient echocardiogram. Echocardiogram not available until 4/20. 3. Reported palpitations. No arrhythmias during hospitalization. Consider TSH as an outpatient.  4. Possible right suprahilar opacification. Significance unclear, discuss with radiology. Suggest repeat chest x-ray to weeks to followup. Nonsmoker. No history to suggest pneumonia. I discussed this with the patient.   Follow-up Information   Follow up with Steven Battiest, MD. Schedule an appointment as soon as possible for a visit in 1 week.   Specialty:  Family Medicine   Contact information:   [address] [place] [address] [phone]      Discharge Diagnoses:  1. Chest pain 2. Reported palpitations, not demonstrated during hospitalization 3. History of coronary artery disease, MI, PCA 2011 4. Diabetes mellitus type 2 5. Hypertension 6. Hyperlipidemia 7. Possible asymmetric right suprahilar opacification  Discharge Condition: improved Disposition: home  Diet recommendation: heart healthy, diabetic diet  Filed Weights   08/06/13 1714  Weight: 136.986 kg (302 lb)    History of present illness:  53 year old man with history of coronary artery disease status post stent 2011, diabetes mellitus, hypertension presented with history of 2 episodes of chest pain associated with exertion, relieved with rest. Associated palpitations. Chest pain free in the emergency department, initial troponin negative, EKG normal sinus rhythm, inferior infarct old, left axis deviation, no acute changes compared to previous study 04/04/2013.  Hospital Course:  Patient had no recurrence of chest pain, no shortness of  breath, no complaints. Telemetry was unremarkable. Cardiac enzymes are negative. Patient insisted on going home. He has a strenuous job and is not usually of chest pain, noncardiac chest pain certainly a consideration. At this point there are no signs or symptoms to suggest ACS but given his history and risk factors, I did recommend close outpatient followup with cardiology, he would like to followup urine [place]. Appointment will be arranged. He is instructed to return for problems in the meantime.  1. Chest pain. Typical features, associated with exertion. However no recurrence since presentation. Has physically intense job with no recent symptoms of CP or SOB. Cardiac enzymes, telemetry unremarkable at this point. 2. Palpitations. Telemetry unremarkable. Consider outpatient monitor. Consider TSH as an outpatient.  3. CAD with MI s/p PCA 2011. On ASA. [place] 02/2010 "total occlusion of the large marginal branch of the right coronary artery and severe disease of the left anterior descending coronary artery. He underwent a nuclear stress test that showed reversible ischemia in the anterolateral wall and fixed defect in inferoapical area, hence the patient underwent drug-eluting stent placement in left anterior descending coronary artery along with a balloon angioplasty of obtuse marginal branch 2." Continue enalapril. Not on beta blocker as an outpatient. 4. DM type 2 maintained on oral meds, Lantus. Stable.  5. HTN. Stable. Continue enalapril. 6. Hyperlipidemia. LDL 86, 05/2013. Continue statin. 7. Possible asymmetric right suprahilar opacification. Completely asymptomatic. Doubt clinically significant. Consider outpatient followup.  Consultants: none Procedures: none  Discharge Instructions  Discharge Orders   Future Appointments Provider Department Dept Phone   08/20/2013 8:10 AM [name], MD [place] [phone]   Future Orders Complete By Expires   Activity as  tolerated - No restrictions  As directed    Diet - low  sodium heart healthy  As directed    Diet Carb Modified  As directed    Discharge instructions  As directed        Medication List         acetaminophen 500 MG tablet  Commonly known as:  TYLENOL  Take 1,000 mg by mouth 2 (two) times daily.     albuterol 108 (90 BASE) MCG/ACT inhaler  Commonly known as:  PROVENTIL HFA;VENTOLIN HFA  Inhale 2 puffs into the lungs every 6 (six) hours as needed for wheezing.     ALPRAZolam 0.5 MG tablet  Commonly known as:  XANAX  Take 0.25-0.5 mg by mouth daily as needed for anxiety (*Normally takes at bedtime but will take during the day as needed).     aspirin EC 81 MG tablet  Take 81 mg by mouth every evening.     aspirin 325 MG tablet  Take 325 mg by mouth every morning.     enalapril 10 MG tablet  Commonly known as:  VASOTEC  Take 10 mg by mouth daily.     fenofibrate 160 MG tablet  Take 1 tablet (160 mg total) by mouth daily.     glyBURIDE-metformin 5-500 MG per tablet  Commonly known as:  GLUCOVANCE  Take 2 tablets by mouth 2 (two) times daily.     insulin glargine 100 UNIT/ML injection  Commonly known as:  LANTUS  Inject 15 Units into the skin at bedtime.     nitroGLYCERIN 0.4 MG SL tablet  Commonly known as:  NITROSTAT  Place 1 tablet (0.4 mg total) under the tongue every 5 (five) minutes as needed for chest pain. If no relief call 911.     omeprazole 20 MG tablet  Commonly known as:  PRILOSEC OTC  Take 1 tablet (20 mg total) by mouth daily.     pravastatin 40 MG tablet  Commonly known as:  PRAVACHOL  Take 1 tablet (40 mg total) by mouth daily.       Allergies  Allergen Reactions  . Adhesive [Tape] Rash   The results of significant diagnostics from this hospitalization (including imaging, microbiology, ancillary and laboratory) are listed below for reference.    Significant Diagnostic Studies: Dg Chest 2 View  08/06/2013   CLINICAL DATA:  Chest pain.  EXAM:  CHEST  2 VIEW  COMPARISON:  DG CHEST 1V PORT dated 03/13/2010  FINDINGS: Trachea is midline. Heart size normal. There may be mildly asymmetric right suprahilar opacification, not definitely seen on the prior examination although the prior study was apical lordotic in technique. Lungs are otherwise clear. No pleural fluid.  IMPRESSION: Difficult to exclude asymmetric right suprahilar opacification. Nonemergent CT chest without contrast could be performed in further evaluation, as clinically indicated.   Electronically Signed   By: Lorin Picket M.D.   On: 08/06/2013 18:47   Labs: Basic Metabolic Panel:  Recent Labs Lab 08/06/13 1730  NA 135*  K 4.0  CL 99  CO2 24  GLUCOSE 197*  BUN 13  CREATININE 0.69  CALCIUM 8.8   CBC:  Recent Labs Lab 08/06/13 1730 08/07/13 0602  WBC 6.3 5.4  NEUTROABS 4.1  --   HGB 12.3* 11.9*  HCT 37.0* 36.2*  MCV 78.6 79.6  PLT 230 234   Cardiac Enzymes:  Recent Labs Lab 08/06/13 1730 08/06/13 2327 08/07/13 0602 08/07/13 1323  TROPONINI <0.30 <0.30 <0.30 <0.30     Recent Labs  08/06/13 1730  PROBNP 134.6*   CBG:  Recent  Labs Lab 08/06/13 2206 08/07/13 0723 08/07/13 1134 08/07/13 1610  GLUCAP 92 196* 219* 192*    Principal Problem:   Chest pain Active Problems:   Other and unspecified hyperlipidemia   Type II or unspecified type diabetes mellitus without mention of complication, not stated as uncontrolled   Essential hypertension, benign   Morbid obesity   Psoriasis   CAD S/P percutaneous coronary angioplasty 2011   Time coordinating discharge: 35 minutes  Signed:  Murray Hodgkins, MD [facility] 08/07/2013, 4:46 PM

## 2013-08-09 ENCOUNTER — Telehealth: Payer: Self-pay | Admitting: Family Medicine

## 2013-08-09 NOTE — Telephone Encounter (Signed)
Just calling to give you a heads up that patient has a cardiac follow up scheduled for August 11, 2013. If you have any questions about his hospitalization, please call Dr Sarajane Jews on his cell number provided.

## 2013-08-10 ENCOUNTER — Encounter: Payer: Self-pay | Admitting: Family Medicine

## 2013-08-10 ENCOUNTER — Ambulatory Visit (INDEPENDENT_AMBULATORY_CARE_PROVIDER_SITE_OTHER): Payer: BC Managed Care – PPO | Admitting: Family Medicine

## 2013-08-10 VITALS — BP 150/88 | Ht 74.0 in | Wt 302.0 lb

## 2013-08-10 DIAGNOSIS — R079 Chest pain, unspecified: Secondary | ICD-10-CM

## 2013-08-10 NOTE — Progress Notes (Signed)
   Subjective:    Patient ID: Steven Crane, male    DOB: 10-18-1960, 53 y.o.   MRN: [id number]  HPI Patient is here today to f/u from the ER on 4/17 d/t chest pain.  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 Around 2:30 is when he started to have chest pain and heart flutters.  The ambulance was called and they told him he needed to go get blood work done at the hospital.  Patient went home and showered and had same feeling again after he showered. He then took 2 aspirins and had his son bring him to [facility].  He stayed overnight.  He was told they did not find anything new.  Pt had chest tightness along with nausea yesterday after working on his truck.   Patient is due to see cardiologist later this week.  Entire hospital chart reviewed. With patient in the room  He has an appt with the cardiologist tomorrow.     Review of Systems No current chest pain no headache no abdominal pain no current nausea or diaphoresis ROS otherwise negative    Objective:   Physical Exam Alert no apparent distress. HEENT normal. Blood pressure repeat 140 her rate he 2. Significant obesity present. Lungs clear. Heart regular in rhythm. Chest wall nontender. Abdomen benign.       Assessment & Plan:  Impression recent chest pain with bothersome features discussed plan strongly encourage getting along with cardiologist. Have advised patient if they recommend any type of dynamic study, which I think they will and/or catheterization, would strongly encourage pressing on with this. 25 minutes spent most in discussion. Followup as scheduled. WSL

## 2013-08-11 ENCOUNTER — Ambulatory Visit (INDEPENDENT_AMBULATORY_CARE_PROVIDER_SITE_OTHER): Payer: BC Managed Care – PPO | Admitting: Cardiovascular Disease

## 2013-08-11 ENCOUNTER — Ambulatory Visit: Payer: BC Managed Care – PPO | Admitting: Family Medicine

## 2013-08-11 ENCOUNTER — Encounter: Payer: Self-pay | Admitting: Cardiovascular Disease

## 2013-08-11 ENCOUNTER — Encounter: Payer: Self-pay | Admitting: *Deleted

## 2013-08-11 VITALS — BP 134/82 | HR 84 | Ht 74.0 in | Wt 335.0 lb

## 2013-08-11 DIAGNOSIS — I2510 Atherosclerotic heart disease of native coronary artery without angina pectoris: Secondary | ICD-10-CM

## 2013-08-11 DIAGNOSIS — I10 Essential (primary) hypertension: Secondary | ICD-10-CM

## 2013-08-11 DIAGNOSIS — E785 Hyperlipidemia, unspecified: Secondary | ICD-10-CM

## 2013-08-11 DIAGNOSIS — R079 Chest pain, unspecified: Secondary | ICD-10-CM

## 2013-08-11 DIAGNOSIS — Z9861 Coronary angioplasty status: Secondary | ICD-10-CM

## 2013-08-11 MED ORDER — METOPROLOL SUCCINATE ER 25 MG PO TB24: 25.0000 mg | ORAL_TABLET | Freq: Every day | ORAL | Status: DC

## 2013-08-11 NOTE — Progress Notes (Signed)
Patient ID: Steven Crane, male   DOB: [birth year], 54 y.o.   MRN: [id number]       CARDIOLOGY CONSULT NOTE  Patient ID: Steven Crane MRN: [id number] DOB/AGE: 23-Mar-1961 53 y.o.  Admit date: (Not on file) Primary Physician [name], MD  Reason for Consultation: CAD, chest pain  HPI: The patient is a 53 year old male with a prior history of myocardial infarction with significant coronary artery disease involving the LAD and left circumflex coronary artery, the second obtuse marginal branch in particular. He underwent stenting and angioplasty of the LAD and 2nd obtuse marginal branch in 2011. He also is a history of hypertension, hyperlipidemia, and insulin-dependent diabetes mellitus. He was recently hospitalized for chest pain and palpitations at [facility]. He ruled out for an acute coronary syndrome. No arrhythmias were reportedly noted on telemetry. Chest xray showed an asymmetric right suprahilar opacification. ECG on 08/06/2013 showed normal sinus rhythm, old inferior infarct, and left axis deviation.   Prior to his MI and stenting in 2011, his primary symptoms were left-sided jaw and tooth ache as well as a fullness in his throat.  He had been working outdoors last week and shoveling and began to experience chest discomfort accompanied by fatigue and weakness. He felt nauseous but did not vomit. He rested for a while and these symptoms resolved. When he got back to shoveling, the symptoms recurred. He was then hospitalized and after ruling out for an acute coronary syndrome, he was discharged.  Two days ago, he had similar symptoms while working outdoors.  HDL 35, LDL 86 on 2/2.  Soc: Nonsmoker, uses snuff.  Allergies  Allergen Reactions  . Adhesive [Tape] Rash    Current Outpatient Prescriptions  Medication Sig Dispense Refill  . acetaminophen (TYLENOL) 500 MG tablet Take 1,000 mg by mouth 2 (two) times daily.      Marland Kitchen albuterol (PROVENTIL HFA;VENTOLIN HFA) 108 (90  BASE) MCG/ACT inhaler Inhale 2 puffs into the lungs every 6 (six) hours as needed for wheezing.  1 Inhaler  5  . ALPRAZolam (XANAX) 0.5 MG tablet Take 0.25-0.5 mg by mouth daily as needed for anxiety (*Normally takes at bedtime but will take during the day as needed).      Marland Kitchen aspirin 325 MG tablet Take 325 mg by mouth every morning.       Marland Kitchen aspirin EC 81 MG tablet Take 81 mg by mouth every evening.      . enalapril (VASOTEC) 10 MG tablet Take 10 mg by mouth daily.      Marland Kitchen etodolac (LODINE XL) 400 MG 24 hr tablet Take 400 mg by mouth daily.       . fenofibrate 160 MG tablet Take 1 tablet (160 mg total) by mouth daily.  30 tablet  5  . glyBURIDE-metformin (GLUCOVANCE) 5-500 MG per tablet Take 2 tablets by mouth 2 (two) times daily.      . insulin glargine (LANTUS) 100 UNIT/ML injection Inject 15 Units into the skin at bedtime.       . nitroGLYCERIN (NITROSTAT) 0.4 MG SL tablet Place 1 tablet (0.4 mg total) under the tongue every 5 (five) minutes as needed for chest pain. If no relief call 911.  60 tablet  12  . omeprazole (PRILOSEC OTC) 20 MG tablet Take 1 tablet (20 mg total) by mouth daily.  30 tablet  11  . pravastatin (PRAVACHOL) 40 MG tablet Take 1 tablet (40 mg total) by mouth daily.  30 tablet  5  No current facility-administered medications for this visit.    Past Medical History  Diagnosis Date  . Coronary artery disease   . Closed head injury 1975  . Myocardial infarction   . Hypertension   . Hyperlipemia   . Asthma     as a child  . Diabetes mellitus   . GERD (gastroesophageal reflux disease)   . Headache(784.0)   . Arthritis   . Morbid obesity   . Reflux     Chronic  . Hypertriglyceridemia   . Psoriasis   . DVT (deep venous thrombosis)     Past Surgical History  Procedure Laterality Date  . Arthroscopy left knee  1981  . Cardiac catheterization  2011  . Coronary stent placement  Nov  2011  . Colonoscopy  01/06/2012    Procedure: COLONOSCOPY;  Surgeon: [name], MD;  Location: [facility];  Service: Endoscopy;  Laterality: N/A;  7:30 AM    History   Social History  . Marital Status: Divorced    Spouse Name: N/A    Number of Children: N/A  . Years of Education: N/A   Occupational History  . Not on file.   Social History Main Topics  . Smoking status: Never Smoker   . Smokeless tobacco: Current User    Types: Snuff  . Alcohol Use: Yes     Comment: social  . Drug Use: No  . Sexual Activity: Yes   Other Topics Concern  . Not on file   Social History Narrative  . No narrative on file     No family history of premature CAD in 1st degree relatives.  Prior to Admission medications   Medication Sig Start Date End Date Taking? Authorizing Provider  acetaminophen (TYLENOL) 500 MG tablet Take 1,000 mg by mouth 2 (two) times daily.   Yes Historical Provider, MD  albuterol (PROVENTIL HFA;VENTOLIN HFA) 108 (90 BASE) MCG/ACT inhaler Inhale 2 puffs into the lungs every 6 (six) hours as needed for wheezing. 02/12/13  Yes [name], MD  ALPRAZolam Duanne Moron) 0.5 MG tablet Take 0.25-0.5 mg by mouth daily as needed for anxiety (*Normally takes at bedtime but will take during the day as needed).   Yes Historical Provider, MD  aspirin 325 MG tablet Take 325 mg by mouth every morning.    Yes Historical Provider, MD  aspirin EC 81 MG tablet Take 81 mg by mouth every evening.   Yes Historical Provider, MD  enalapril (VASOTEC) 10 MG tablet Take 10 mg by mouth daily.   Yes Historical Provider, MD  etodolac (LODINE XL) 400 MG 24 hr tablet Take 400 mg by mouth daily.  07/06/13  Yes Historical Provider, MD  fenofibrate 160 MG tablet Take 1 tablet (160 mg total) by mouth daily. 02/12/13  Yes [name], MD  glyBURIDE-metformin (GLUCOVANCE) 5-500 MG per tablet Take 2 tablets by mouth 2 (two) times daily.   Yes Historical Provider, MD  insulin glargine (LANTUS) 100 UNIT/ML injection Inject 15 Units into the skin at bedtime.    Yes Historical  Provider, MD  nitroGLYCERIN (NITROSTAT) 0.4 MG SL tablet Place 1 tablet (0.4 mg total) under the tongue every 5 (five) minutes as needed for chest pain. If no relief call 911. 08/07/13  Yes Samuella Cota, MD  omeprazole (PRILOSEC OTC) 20 MG tablet Take 1 tablet (20 mg total) by mouth daily. 02/12/13  Yes [name], MD  pravastatin (PRAVACHOL) 40 MG tablet Take 1 tablet (40 mg  total) by mouth daily. 02/12/13  Yes [name], MD     Review of systems complete and found to be negative unless listed above in HPI     Physical exam Blood pressure 134/82, pulse 84, height 6\' 2"  (1.88 m), weight 335 lb (151.955 kg), SpO2 96.00%. General: NAD, obese Neck: No JVD, no thyromegaly or thyroid nodule.  Lungs: Clear to auscultation bilaterally with normal respiratory effort. CV: Nondisplaced PMI.  Heart regular S1/S2, no S3/S4, soft I/VI systolic murmur along left sternal border.  No peripheral edema.  No carotid bruit.  Normal pedal pulses.  Abdomen: Soft, obese.  Skin: Intact without lesions or rashes.  Neurologic: Alert and oriented x 3.  Psych: Normal affect. Extremities: No clubbing or cyanosis.  HEENT: Normal.   Labs:   Lab Results  Component Value Date   WBC 5.4 08/07/2013   HGB 11.9* 08/07/2013   HCT 36.2* 08/07/2013   MCV 79.6 08/07/2013   PLT 234 08/07/2013    Recent Labs Lab 08/06/13 1730  NA 135*  K 4.0  CL 99  CO2 24  BUN 13  CREATININE 0.69  CALCIUM 8.8  GLUCOSE 197*   Lab Results  Component Value Date   CKTOTAL 71 03/20/2010   CKMB 1.5 03/20/2010   TROPONINI <0.30 08/07/2013    Lab Results  Component Value Date   CHOL 153 05/24/2013   CHOL 126 08/14/2012   CHOL  Value: 189        ATP III CLASSIFICATION:  <200     mg/dL   Desirable  200-239  mg/dL   Borderline High  >=240    mg/dL   High        03/14/2010   Lab Results  Component Value Date   HDL 35* 05/24/2013   HDL 29* 08/14/2012   HDL 33* 03/14/2010   Lab Results  Component Value Date   LDLCALC  86 05/24/2013   LDLCALC 76 08/14/2012   LDLCALC  Value: 103        Total Cholesterol/HDL:CHD Risk Coronary Heart Disease Risk Table                     Men   Women  1/2 Average Risk   3.4   3.3  Average Risk       5.0   4.4  2 X Average Risk   9.6   7.1  3 X Average Risk  23.4   11.0        Use the calculated Patient Ratio above and the CHD Risk Table to determine the patient's CHD Risk.        ATP III CLASSIFICATION (LDL):  <100     mg/dL   Optimal  100-129  mg/dL   Near or Above                    Optimal  130-159  mg/dL   Borderline  160-189  mg/dL   High  >190     mg/dL   Very High* 03/14/2010   Lab Results  Component Value Date   TRIG 160* 05/24/2013   TRIG 103 08/14/2012   TRIG 264* 03/14/2010   Lab Results  Component Value Date   CHOLHDL 4.4 05/24/2013   CHOLHDL 4.3 08/14/2012   CHOLHDL 5.7 03/14/2010   No results found for this basename: LDLDIRECT        Studies: No results found.  ASSESSMENT AND PLAN:  1. Chest pain and palpitations,  in the setting of known CAD with stenting of LAD and angioplasty of OM2: His symptoms are concerning for ischemic heart disease. I will proceed with a Lexiscan Cardiolite to evaluate for ischemia, particularly in the OM 2 distribution given that he only had angioplasty of that vessel 4 years ago. I'll also obtain an echocardiogram to assess left ventricular function and to evaluate for valvular pathology. I will start Toprol-XL 25 mg daily. He'll continue aspirin, ACE inhibitor, and pravastatin. I encouraged him to use sublingual nitroglycerin as needed. 2. HTN: Controlled on present therapy. 3. Hyperlipidemia: Controlled on pravastatin 40 mg daily.  Dispo: f/u 2 weeks.  Signed: Kate Sable, M.D., F.A.C.C.  08/11/2013, 2:45 PM

## 2013-08-11 NOTE — Patient Instructions (Signed)
   Begin Toprol XL 25mg  daily - new sent to pharm Continue all other medications.   Your physician has requested that you have an echocardiogram. Echocardiography is a painless test that uses sound waves to create images of your heart. It provides your doctor with information about the size and shape of your heart and how well your heart's chambers and valves are working. This procedure takes approximately one hour. There are no restrictions for this procedure. Your physician has requested that you have a lexiscan myoview. For further information please visit HugeFiesta.[email or web address]. Please follow instruction sheet, as given. Office will contact with results via phone or letter.   Follow up in  2 weeks

## 2013-08-12 ENCOUNTER — Encounter (INDEPENDENT_AMBULATORY_CARE_PROVIDER_SITE_OTHER): Payer: BC Managed Care – PPO | Admitting: Cardiology

## 2013-08-12 ENCOUNTER — Other Ambulatory Visit: Payer: Self-pay | Admitting: *Deleted

## 2013-08-12 DIAGNOSIS — R0609 Other forms of dyspnea: Secondary | ICD-10-CM

## 2013-08-12 DIAGNOSIS — R0989 Other specified symptoms and signs involving the circulatory and respiratory systems: Secondary | ICD-10-CM

## 2013-08-12 DIAGNOSIS — R079 Chest pain, unspecified: Secondary | ICD-10-CM

## 2013-08-12 DIAGNOSIS — I2510 Atherosclerotic heart disease of native coronary artery without angina pectoris: Secondary | ICD-10-CM

## 2013-08-12 DIAGNOSIS — I25810 Atherosclerosis of coronary artery bypass graft(s) without angina pectoris: Secondary | ICD-10-CM

## 2013-08-12 MED ORDER — OMEPRAZOLE MAGNESIUM 20 MG PO TBEC: DELAYED_RELEASE_TABLET | ORAL | Status: DC

## 2013-08-17 ENCOUNTER — Encounter (HOSPITAL_COMMUNITY)
Admission: RE | Admit: 2013-08-17 | Discharge: 2013-08-17 | Disposition: A | Discharge status: Home / Self Care | Payer: BC Managed Care – PPO | Source: Ambulatory Visit | Attending: Cardiovascular Disease | Admitting: Cardiovascular Disease

## 2013-08-17 ENCOUNTER — Telehealth: Payer: Self-pay | Admitting: *Deleted

## 2013-08-17 ENCOUNTER — Encounter (HOSPITAL_COMMUNITY): Payer: Self-pay

## 2013-08-17 DIAGNOSIS — R079 Chest pain, unspecified: Secondary | ICD-10-CM

## 2013-08-17 DIAGNOSIS — Z9861 Coronary angioplasty status: Secondary | ICD-10-CM

## 2013-08-17 DIAGNOSIS — I2510 Atherosclerotic heart disease of native coronary artery without angina pectoris: Secondary | ICD-10-CM

## 2013-08-17 HISTORY — DX: Heart failure, unspecified: I50.9

## 2013-08-17 MED ORDER — SODIUM CHLORIDE 0.9 % IJ SOLN
INTRAMUSCULAR | Status: AC
  Administered 2013-08-17: 10 mL via INTRAVENOUS
  Filled 2013-08-17: qty 10

## 2013-08-17 MED ORDER — TECHNETIUM TC 99M SESTAMIBI GENERIC - CARDIOLITE
30.0000 | Freq: Once | INTRAVENOUS | Status: AC | PRN
  Administered 2013-08-17: 30 via INTRAVENOUS

## 2013-08-17 MED ORDER — REGADENOSON 0.4 MG/5ML IV SOLN
INTRAVENOUS | Status: AC
  Administered 2013-08-17: 0.4 mg via INTRAVENOUS
  Filled 2013-08-17: qty 5

## 2013-08-17 MED ORDER — TECHNETIUM TC 99M SESTAMIBI - CARDIOLITE
10.0000 | Freq: Once | INTRAVENOUS | Status: AC | PRN
  Administered 2013-08-17: 07:00:00 10 via INTRAVENOUS

## 2013-08-17 NOTE — Progress Notes (Signed)
Stress Lab Nurses Notes - [facility]ine Na  JOSHAU CODE 08/17/2013 Reason for doing test: CAD and Chest Pain Type of test: Wille Glaser Nurse performing test: Gerrit Halls, RN Nuclear Medicine Tech: Dyanne Carrel Echo Tech: Not Applicable MD performing test: Koneswaran/K.Lawrence NP Family MD: Mickie Hillier Test explained and consent signed: yes IV started: 22g jelco, Saline lock flushed, No redness or edema and Saline lock started in radiology Symptoms: dizziness &  chest pressure Treatment/Intervention: None Reason test stopped: protocol completed After recovery IV was: Discontinued via X-ray tech and No redness or edema Patient to return to [place]. Med at : 9:40 Patient discharged: Home Patient's Condition upon discharge was: stable Comments: During test BP 162/75 & HR 98.  Recovery BP 146/79 & HR 81.  Symptoms resolved in recovery. Donnajean Lopes

## 2013-08-17 NOTE — Telephone Encounter (Signed)
Notes Recorded by Laurine Blazer, LPN on 01/06/9149 at 56:97 PM Patient notified and verbalized understanding.

## 2013-08-17 NOTE — Telephone Encounter (Signed)
Message copied by Laurine Blazer on Tue Aug 17, 2013 12:02 PM ------      Message from: Kate Sable A      Created: Fri Aug 13, 2013  1:34 PM       Inform pt that pumping function is normal, but one wall is not moving as well as other walls (only mildly abnormal). I will discuss in detail at next visit. Await stress test results. ------

## 2013-08-19 ENCOUNTER — Telehealth: Payer: Self-pay | Admitting: *Deleted

## 2013-08-19 NOTE — Telephone Encounter (Signed)
Notes Recorded by Laurine Blazer, LPN on 4/46/2863 at 8:17 PM Patient notified and verbalized understanding. Already has follow up scheduled for Wednesday, 08/25/2013 with Dr. Bronson Ing.

## 2013-08-19 NOTE — Telephone Encounter (Signed)
Message copied by Laurine Blazer on Thu Aug 19, 2013  3:58 PM ------      Message from: Kate Sable A      Created: Tue Aug 17, 2013  4:54 PM       Multiple perfusion defects. Will need to discuss with pt and plan for cath at f/u ov (should be early next week) ------

## 2013-08-20 ENCOUNTER — Ambulatory Visit: Payer: BC Managed Care – PPO | Admitting: Family Medicine

## 2013-08-20 DIAGNOSIS — I2510 Atherosclerotic heart disease of native coronary artery without angina pectoris: Secondary | ICD-10-CM

## 2013-08-20 HISTORY — DX: Atherosclerotic heart disease of native coronary artery without angina pectoris: I25.10

## 2013-08-25 ENCOUNTER — Encounter: Payer: Self-pay | Admitting: *Deleted

## 2013-08-25 ENCOUNTER — Encounter: Payer: Self-pay | Admitting: Cardiovascular Disease

## 2013-08-25 ENCOUNTER — Other Ambulatory Visit: Payer: Self-pay | Admitting: Cardiovascular Disease

## 2013-08-25 ENCOUNTER — Telehealth: Payer: Self-pay | Admitting: Cardiovascular Disease

## 2013-08-25 ENCOUNTER — Ambulatory Visit (INDEPENDENT_AMBULATORY_CARE_PROVIDER_SITE_OTHER): Payer: BC Managed Care – PPO | Admitting: Cardiovascular Disease

## 2013-08-25 ENCOUNTER — Encounter (HOSPITAL_COMMUNITY): Payer: Self-pay | Admitting: Pharmacy Technician

## 2013-08-25 VITALS — BP 147/79 | HR 80 | Ht 74.0 in | Wt 335.0 lb

## 2013-08-25 DIAGNOSIS — Z9861 Coronary angioplasty status: Secondary | ICD-10-CM

## 2013-08-25 DIAGNOSIS — R002 Palpitations: Secondary | ICD-10-CM

## 2013-08-25 DIAGNOSIS — I2510 Atherosclerotic heart disease of native coronary artery without angina pectoris: Secondary | ICD-10-CM

## 2013-08-25 DIAGNOSIS — E785 Hyperlipidemia, unspecified: Secondary | ICD-10-CM

## 2013-08-25 DIAGNOSIS — R079 Chest pain, unspecified: Secondary | ICD-10-CM

## 2013-08-25 DIAGNOSIS — R931 Abnormal findings on diagnostic imaging of heart and coronary circulation: Secondary | ICD-10-CM

## 2013-08-25 DIAGNOSIS — I10 Essential (primary) hypertension: Secondary | ICD-10-CM

## 2013-08-25 DIAGNOSIS — R9439 Abnormal result of other cardiovascular function study: Secondary | ICD-10-CM

## 2013-08-25 MED ORDER — METOPROLOL SUCCINATE ER 50 MG PO TB24: 50.0000 mg | ORAL_TABLET | Freq: Every day | ORAL | Status: DC

## 2013-08-25 NOTE — Addendum Note (Signed)
Addended by: Laurine Blazer on: 08/25/2013 02:03 PM   Modules accepted: Orders

## 2013-08-25 NOTE — Telephone Encounter (Signed)
:   Left heart cath - Friday, 5/8 - 9:00 - [place]  Checking percert

## 2013-08-25 NOTE — Patient Instructions (Signed)
   Increase Toprol XL to 50mg  daily (may take 2 tabs of your 25mg  tablet daily till finish current supply) - new sent to pharm Continue all other medications.   Your physician has requested that you have a cardiac catheterization. Cardiac catheterization is used to diagnose and/or treat various heart conditions. Doctors may recommend this procedure for a number of different reasons. The most common reason is to evaluate chest pain. Chest pain can be a symptom of coronary artery disease (CAD), and cardiac catheterization can show whether plaque is narrowing or blocking your heart's arteries. This procedure is also used to evaluate the valves, as well as measure the blood flow and oxygen levels in different parts of your heart. For further information please visit HugeFiesta.[email or web address]. Please follow instruction sheet, as given. Follow up will be given at time of discharge from procedure

## 2013-08-25 NOTE — Progress Notes (Signed)
Patient ID: Steven Crane, male   DOB: Mar 27, 1961, 53 y.o.   MRN: [id number]      SUBJECTIVE: The patient is here to followup on the results of cardiovascular testing performed for the evaluation of chest pain in the setting of coronary artery disease with prior PCI.  In summary, he has a prior history of myocardial infarction with significant coronary artery disease involving the LAD and left circumflex coronary artery, the second obtuse marginal branch in particular. He underwent stenting and angioplasty of the LAD and 2nd obtuse marginal branch in 2011. He also has a history of hypertension, hyperlipidemia, and insulin-dependent diabetes mellitus. He was recently hospitalized for chest pain and palpitations at [facility]. He ruled out for an acute coronary syndrome. No arrhythmias were reportedly noted on telemetry. Chest xray showed an asymmetric right suprahilar opacification.  ECG on 08/06/2013 showed normal sinus rhythm, old inferior infarct, and left axis deviation.   Prior to his MI and stenting in 2011, his primary symptoms were left-sided jaw and tooth ache as well as a fullness in his throat.  Echocardiography demonstrated normal LV chamber size with moderate LVH and LVEF 55-60%, mid to basal inferolateral hypokinesis, and grade 1 diastolic dysfunction.  Nuclear stress testing revealed multiple reversible perfusion defects as detailed below, as well as a mild to moderate degree of inferolateral wall peri-infarct ischemia.  Since his last visit with me, he has had episodic palpitations. He denies recurrence of chest pain.   Allergies  Allergen Reactions  . Adhesive [Tape] Rash    Current Outpatient Prescriptions  Medication Sig Dispense Refill  . acetaminophen (TYLENOL) 500 MG tablet Take 1,000 mg by mouth 2 (two) times daily.      Marland Kitchen albuterol (PROVENTIL HFA;VENTOLIN HFA) 108 (90 BASE) MCG/ACT inhaler Inhale 2 puffs into the lungs every 6 (six) hours as needed for  wheezing.  1 Inhaler  5  . ALPRAZolam (XANAX) 0.5 MG tablet Take 0.25-0.5 mg by mouth daily as needed for anxiety (*Normally takes at bedtime but will take during the day as needed).      Marland Kitchen aspirin EC 81 MG tablet Take 81 mg by mouth every evening.      . enalapril (VASOTEC) 10 MG tablet Take 10 mg by mouth daily.      Marland Kitchen etodolac (LODINE XL) 400 MG 24 hr tablet Take 400 mg by mouth daily.       . fenofibrate 160 MG tablet Take 1 tablet (160 mg total) by mouth daily.  30 tablet  5  . glyBURIDE-metformin (GLUCOVANCE) 5-500 MG per tablet Take 2 tablets by mouth 2 (two) times daily.      . insulin glargine (LANTUS) 100 UNIT/ML injection Inject 15 Units into the skin at bedtime.       . metoprolol succinate (TOPROL-XL) 25 MG 24 hr tablet Take 1 tablet (25 mg total) by mouth daily.  30 tablet  6  . nitroGLYCERIN (NITROSTAT) 0.4 MG SL tablet Place 1 tablet (0.4 mg total) under the tongue every 5 (five) minutes as needed for chest pain. If no relief call 911.  60 tablet  12  . omeprazole (PRILOSEC OTC) 20 MG tablet Take two tablets daily  60 tablet  11  . pravastatin (PRAVACHOL) 40 MG tablet Take 1 tablet (40 mg total) by mouth daily.  30 tablet  5   No current facility-administered medications for this visit.    Past Medical History  Diagnosis Date  . Coronary artery disease   .  Closed head injury 1975  . Myocardial infarction   . Hypertension   . Hyperlipemia   . Asthma     as a child  . Diabetes mellitus   . GERD (gastroesophageal reflux disease)   . Headache(784.0)   . Arthritis   . Morbid obesity   . Reflux     Chronic  . Hypertriglyceridemia   . Psoriasis   . DVT (deep venous thrombosis)   . CHF (congestive heart failure)     Past Surgical History  Procedure Laterality Date  . Arthroscopy left knee  1981  . Cardiac catheterization  2011  . Coronary stent placement  Nov  2011  . Colonoscopy  01/06/2012    Procedure: COLONOSCOPY;  Surgeon: [name], MD;  Location[facility];  Service: Endoscopy;  Laterality: N/A;  7:30 AM    History   Social History  . Marital Status: Single    Spouse Name: N/A    Number of Children: N/A  . Years of Education: N/A   Occupational History  . Not on file.   Social History Main Topics  . Smoking status: Never Smoker   . Smokeless tobacco: Current User    Types: Snuff  . Alcohol Use: Yes     Comment: social  . Drug Use: No  . Sexual Activity: Yes   Other Topics Concern  . Not on file   Social History Narrative  . No narrative on file     Filed Vitals:   08/25/13 1334  Height: 6\' 2"  (1.88 m)  Weight: 335 lb (151.955 kg)   BP 147/79  Pulse 80   PHYSICAL EXAM General: NAD, obese  Neck: No JVD, no thyromegaly or thyroid nodule.  Lungs: Clear to auscultation bilaterally with normal respiratory effort.  CV: Nondisplaced PMI. Heart regular S1/S2, no S3/S4, soft I/VI systolic murmur along left sternal border. No peripheral edema. No carotid bruit. Normal pedal pulses.  Abdomen: Soft, obese.  Skin: Intact without lesions or rashes.  Neurologic: Alert and oriented x 3.  Psych: Normal affect.  Extremities: No clubbing or cyanosis.  HEENT: Normal.   ECG: reviewed and available in electronic records.   Nuclear stress test: FINDINGS: The patient was stressed according to [facility] protocol. The heart rate ranged from 65 beats per min up to 99 beats per min. The blood pressure range from 136/80 up to 162/75. No chest pain was reported.  The resting ECG showed normal sinus rhythm with a nonspecific T-wave abnormality in leads 1, aVL, and V5 through V6. There were no significant changes seen with stress, and no arrhythmias noted.  Analysis of the raw data showed no significant extracardiac radiotracer uptake. Analysis of the perfusion images revealed multiple defects. The first defect was a large, severely intense, completely reversible defect seen in the anteroapical wall, apical septal wall, and  apical inferoseptal wall. The second defect was a large, severely intense, completely reversible defect in the mid anteroseptal wall. The third defect was a large, severely intense, mostly fixed defect in the mid inferolateral and basal inferolateral walls. There was a mild to moderate degree of peri-infarct ischemia seen. Left ventricular systolic function was mildly reduced, with a calculated LV EF of 41%. The aforementioned defects were mildly hypokinetic. The summed stress score was 21, summed rest score 9, leading to a summed difference score of 12.  IMPRESSION: 1. Markedly abnormal Lexiscan Cardiolite stress test.  2. Multiple, completely reversible perfusion defects as noted above. There was an additional  area of myocardial scar in the mid to basal inferolateral walls with a mild to moderate degree of peri-infarct ischemia.  3. Mildly reduced left ventricular systolic function, calculated LV EF 41%.      ASSESSMENT AND PLAN: 1. Chest pain and palpitations, in the setting of known CAD with stenting of LAD and angioplasty of OM2: His symptoms are concerning for ischemic heart disease. Lexiscan Cardiolite showed multiple perfusion defects, and inferolateral scar with peri-infarct ischemia. Will proceed with left heart catheterization and coronary angiography. Continue aspirin, ACE inhibitor, and pravastatin. I encouraged him to use sublingual nitroglycerin as needed. I will increase Toprol-XL to 50 mg daily. 2. HTN: Mildly elevated today, but was controlled at last visit. Due to ischemic heart disease, I am increasing Toprol-XL to 50 mg daily, which may reduce BP as well. 3. Hyperlipidemia: Controlled on pravastatin 40 mg daily.   Dispo: f/u 4-6 weeks (after cath).   Kate Sable, M.D., F.A.C.C.

## 2013-08-25 NOTE — Telephone Encounter (Signed)
No precert required 

## 2013-08-27 ENCOUNTER — Ambulatory Visit (HOSPITAL_COMMUNITY)
Admission: RE | Admit: 2013-08-27 | Discharge: 2013-08-28 | Disposition: A | Discharge status: Home / Self Care | Payer: BC Managed Care – PPO | Source: Ambulatory Visit | Attending: Cardiovascular Disease | Admitting: Cardiovascular Disease

## 2013-08-27 ENCOUNTER — Encounter (HOSPITAL_COMMUNITY)
Admission: RE | Disposition: A | Discharge status: Home / Self Care | Payer: BC Managed Care – PPO | Source: Ambulatory Visit | Attending: Cardiovascular Disease

## 2013-08-27 ENCOUNTER — Encounter (HOSPITAL_COMMUNITY): Payer: Self-pay | Admitting: General Practice

## 2013-08-27 DIAGNOSIS — N200 Calculus of kidney: Secondary | ICD-10-CM

## 2013-08-27 DIAGNOSIS — I509 Heart failure, unspecified: Secondary | ICD-10-CM | POA: Insufficient documentation

## 2013-08-27 DIAGNOSIS — Z955 Presence of coronary angioplasty implant and graft: Secondary | ICD-10-CM

## 2013-08-27 DIAGNOSIS — K219 Gastro-esophageal reflux disease without esophagitis: Secondary | ICD-10-CM | POA: Insufficient documentation

## 2013-08-27 DIAGNOSIS — I200 Unstable angina: Secondary | ICD-10-CM | POA: Diagnosis present

## 2013-08-27 DIAGNOSIS — Z794 Long term (current) use of insulin: Secondary | ICD-10-CM | POA: Insufficient documentation

## 2013-08-27 DIAGNOSIS — L409 Psoriasis, unspecified: Secondary | ICD-10-CM

## 2013-08-27 DIAGNOSIS — Z7982 Long term (current) use of aspirin: Secondary | ICD-10-CM | POA: Insufficient documentation

## 2013-08-27 DIAGNOSIS — I10 Essential (primary) hypertension: Secondary | ICD-10-CM | POA: Insufficient documentation

## 2013-08-27 DIAGNOSIS — E119 Type 2 diabetes mellitus without complications: Secondary | ICD-10-CM

## 2013-08-27 DIAGNOSIS — I252 Old myocardial infarction: Secondary | ICD-10-CM | POA: Insufficient documentation

## 2013-08-27 DIAGNOSIS — Z9861 Coronary angioplasty status: Secondary | ICD-10-CM

## 2013-08-27 DIAGNOSIS — E785 Hyperlipidemia, unspecified: Secondary | ICD-10-CM | POA: Insufficient documentation

## 2013-08-27 DIAGNOSIS — E781 Pure hyperglyceridemia: Secondary | ICD-10-CM | POA: Insufficient documentation

## 2013-08-27 DIAGNOSIS — I2510 Atherosclerotic heart disease of native coronary artery without angina pectoris: Secondary | ICD-10-CM

## 2013-08-27 DIAGNOSIS — Z86718 Personal history of other venous thrombosis and embolism: Secondary | ICD-10-CM | POA: Insufficient documentation

## 2013-08-27 DIAGNOSIS — J683 Other acute and subacute respiratory conditions due to chemicals, gases, fumes and vapors: Secondary | ICD-10-CM

## 2013-08-27 DIAGNOSIS — R079 Chest pain, unspecified: Secondary | ICD-10-CM

## 2013-08-27 DIAGNOSIS — Z6841 Body Mass Index (BMI) 40.0 and over, adult: Secondary | ICD-10-CM | POA: Insufficient documentation

## 2013-08-27 DIAGNOSIS — L408 Other psoriasis: Secondary | ICD-10-CM | POA: Insufficient documentation

## 2013-08-27 DIAGNOSIS — R931 Abnormal findings on diagnostic imaging of heart and coronary circulation: Secondary | ICD-10-CM

## 2013-08-27 DIAGNOSIS — I255 Ischemic cardiomyopathy: Secondary | ICD-10-CM

## 2013-08-27 HISTORY — DX: Other chronic pain: G89.29

## 2013-08-27 HISTORY — DX: Unspecified chronic bronchitis: J42

## 2013-08-27 HISTORY — PX: LEFT HEART CATHETERIZATION WITH CORONARY ANGIOGRAM: SHX5451

## 2013-08-27 HISTORY — DX: Low back pain, unspecified: M54.50

## 2013-08-27 HISTORY — DX: Low back pain: M54.5

## 2013-08-27 HISTORY — DX: Type 2 diabetes mellitus without complications: E11.9

## 2013-08-27 HISTORY — DX: Heart disease, unspecified: I51.9

## 2013-08-27 LAB — GLUCOSE, CAPILLARY
GLUCOSE-CAPILLARY: 102 mg/dL — AB (ref 70–99)
Glucose-Capillary: 88 mg/dL (ref 70–99)
Glucose-Capillary: 201 mg/dL — ABNORMAL HIGH (ref 70–99)
Glucose-Capillary: 228 mg/dL — ABNORMAL HIGH (ref 70–99)

## 2013-08-27 LAB — POCT ACTIVATED CLOTTING TIME: ACTIVATED CLOTTING TIME: 476 s

## 2013-08-27 LAB — BASIC METABOLIC PANEL
BUN: 14 mg/dL (ref 6–23)
CALCIUM: 8.7 mg/dL (ref 8.4–10.5)
CO2: 21 mEq/L (ref 19–32)
Chloride: 104 mEq/L (ref 96–112)
Creatinine, Ser: 0.67 mg/dL (ref 0.50–1.35)
Glucose, Bld: 116 mg/dL — ABNORMAL HIGH (ref 70–99)
Potassium: 4.2 mEq/L (ref 3.7–5.3)
Sodium: 139 mEq/L (ref 137–147)

## 2013-08-27 LAB — PLATELET INHIBITION P2Y12: Platelet Function  P2Y12: 339 [PRU] (ref 194–418)

## 2013-08-27 LAB — CBC
HCT: 35.3 % — ABNORMAL LOW (ref 39.0–52.0)
Hemoglobin: 11.6 g/dL — ABNORMAL LOW (ref 13.0–17.0)
MCH: 25.6 pg — AB (ref 26.0–34.0)
MCHC: 32.9 g/dL (ref 30.0–36.0)
MCV: 77.9 fL — ABNORMAL LOW (ref 78.0–100.0)
PLATELETS: 208 10*3/uL (ref 150–400)
RBC: 4.53 MIL/uL (ref 4.22–5.81)
RDW: 14.3 % (ref 11.5–15.5)
WBC: 4.7 10*3/uL (ref 4.0–10.5)

## 2013-08-27 LAB — PROTIME-INR
INR: 1.03 (ref 0.00–1.49)
PROTHROMBIN TIME: 13.3 s (ref 11.6–15.2)

## 2013-08-27 SURGERY — LEFT HEART CATHETERIZATION WITH CORONARY ANGIOGRAM
Anesthesia: LOCAL

## 2013-08-27 MED ORDER — INSULIN GLARGINE 100 UNIT/ML ~~LOC~~ SOLN
15.0000 [IU] | Freq: Every day | SUBCUTANEOUS | Status: DC
  Administered 2013-08-27: 15 [IU] via SUBCUTANEOUS
  Filled 2013-08-27 (×3): qty 0.15

## 2013-08-27 MED ORDER — HEPARIN SODIUM (PORCINE) 1000 UNIT/ML IJ SOLN
INTRAMUSCULAR | Status: AC
  Filled 2013-08-27: qty 1

## 2013-08-27 MED ORDER — CLOPIDOGREL BISULFATE 75 MG PO TABS
75.0000 mg | ORAL_TABLET | Freq: Every day | ORAL | Status: DC
  Administered 2013-08-28: 09:00:00 75 mg via ORAL
  Filled 2013-08-27: qty 1

## 2013-08-27 MED ORDER — NITROGLYCERIN 0.4 MG SL SUBL: 0.4000 mg | SUBLINGUAL_TABLET | SUBLINGUAL | Status: DC | PRN

## 2013-08-27 MED ORDER — MIDAZOLAM HCL 2 MG/2ML IJ SOLN
INTRAMUSCULAR | Status: AC
  Filled 2013-08-27: qty 2

## 2013-08-27 MED ORDER — SODIUM CHLORIDE 0.9 % IV SOLN: 250.0000 mL | INTRAVENOUS | Status: DC | PRN

## 2013-08-27 MED ORDER — ENALAPRIL MALEATE 10 MG PO TABS
10.0000 mg | ORAL_TABLET | Freq: Every day | ORAL | Status: DC
  Administered 2013-08-27: 16:00:00 10 mg via ORAL
  Filled 2013-08-27 (×2): qty 1

## 2013-08-27 MED ORDER — SODIUM CHLORIDE 0.9 % IV SOLN: INTRAVENOUS | Status: AC

## 2013-08-27 MED ORDER — ALPRAZOLAM 0.25 MG PO TABS: 0.2500 mg | ORAL_TABLET | Freq: Every day | ORAL | Status: DC | PRN

## 2013-08-27 MED ORDER — FENTANYL CITRATE 0.05 MG/ML IJ SOLN
INTRAMUSCULAR | Status: AC
  Filled 2013-08-27: qty 2

## 2013-08-27 MED ORDER — CLOPIDOGREL BISULFATE 300 MG PO TABS
ORAL_TABLET | ORAL | Status: AC
Start: 2013-08-27 — End: 2013-08-27
  Filled 2013-08-27: qty 2

## 2013-08-27 MED ORDER — FENOFIBRATE 160 MG PO TABS
160.0000 mg | ORAL_TABLET | Freq: Every day | ORAL | Status: DC
  Administered 2013-08-27: 160 mg via ORAL
  Filled 2013-08-27 (×2): qty 1

## 2013-08-27 MED ORDER — ASPIRIN EC 81 MG PO TBEC
81.0000 mg | DELAYED_RELEASE_TABLET | Freq: Every evening | ORAL | Status: DC
  Filled 2013-08-27: qty 1

## 2013-08-27 MED ORDER — METOPROLOL SUCCINATE ER 50 MG PO TB24
50.0000 mg | ORAL_TABLET | Freq: Every day | ORAL | Status: DC
  Filled 2013-08-27: qty 1

## 2013-08-27 MED ORDER — VERAPAMIL HCL 2.5 MG/ML IV SOLN
INTRAVENOUS | Status: AC
  Filled 2013-08-27: qty 2

## 2013-08-27 MED ORDER — INSULIN ASPART 100 UNIT/ML ~~LOC~~ SOLN
0.0000 [IU] | Freq: Three times a day (TID) | SUBCUTANEOUS | Status: DC
Start: 2013-08-28 — End: 2013-08-28
  Administered 2013-08-28: 09:00:00 3 [IU] via SUBCUTANEOUS

## 2013-08-27 MED ORDER — SIMVASTATIN 40 MG PO TABS
40.0000 mg | ORAL_TABLET | Freq: Every day | ORAL | Status: DC
  Administered 2013-08-27: 16:00:00 40 mg via ORAL
  Filled 2013-08-27 (×2): qty 1

## 2013-08-27 MED ORDER — ASPIRIN 81 MG PO CHEW
81.0000 mg | CHEWABLE_TABLET | ORAL | Status: AC
  Administered 2013-08-27: 81 mg via ORAL
  Filled 2013-08-27: qty 1

## 2013-08-27 MED ORDER — SODIUM CHLORIDE 0.9 % IJ SOLN: 3.0000 mL | INTRAMUSCULAR | Status: DC | PRN

## 2013-08-27 MED ORDER — SODIUM CHLORIDE 0.9 % IV SOLN
INTRAVENOUS | Status: DC
  Administered 2013-08-27: 08:00:00 via INTRAVENOUS

## 2013-08-27 MED ORDER — SODIUM CHLORIDE 0.9 % IJ SOLN: 3.0000 mL | Freq: Two times a day (BID) | INTRAMUSCULAR | Status: DC

## 2013-08-27 NOTE — H&P (View-Only) (Signed)
Patient ID: Steven Crane, male   DOB: Mar 27, 1961, 53 y.o.   MRN: [id number]      SUBJECTIVE: The patient is here to followup on the results of cardiovascular testing performed for the evaluation of chest pain in the setting of coronary artery disease with prior PCI.  In summary, he has a prior history of myocardial infarction with significant coronary artery disease involving the LAD and left circumflex coronary artery, the second obtuse marginal branch in particular. He underwent stenting and angioplasty of the LAD and 2nd obtuse marginal branch in 2011. He also has a history of hypertension, hyperlipidemia, and insulin-dependent diabetes mellitus. He was recently hospitalized for chest pain and palpitations at [facility]. He ruled out for an acute coronary syndrome. No arrhythmias were reportedly noted on telemetry. Chest xray showed an asymmetric right suprahilar opacification.  ECG on 08/06/2013 showed normal sinus rhythm, old inferior infarct, and left axis deviation.   Prior to his MI and stenting in 2011, his primary symptoms were left-sided jaw and tooth ache as well as a fullness in his throat.  Echocardiography demonstrated normal LV chamber size with moderate LVH and LVEF 55-60%, mid to basal inferolateral hypokinesis, and grade 1 diastolic dysfunction.  Nuclear stress testing revealed multiple reversible perfusion defects as detailed below, as well as a mild to moderate degree of inferolateral wall peri-infarct ischemia.  Since his last visit with me, he has had episodic palpitations. He denies recurrence of chest pain.   Allergies  Allergen Reactions  . Adhesive [Tape] Rash    Current Outpatient Prescriptions  Medication Sig Dispense Refill  . acetaminophen (TYLENOL) 500 MG tablet Take 1,000 mg by mouth 2 (two) times daily.      Marland Kitchen albuterol (PROVENTIL HFA;VENTOLIN HFA) 108 (90 BASE) MCG/ACT inhaler Inhale 2 puffs into the lungs every 6 (six) hours as needed for  wheezing.  1 Inhaler  5  . ALPRAZolam (XANAX) 0.5 MG tablet Take 0.25-0.5 mg by mouth daily as needed for anxiety (*Normally takes at bedtime but will take during the day as needed).      Marland Kitchen aspirin EC 81 MG tablet Take 81 mg by mouth every evening.      . enalapril (VASOTEC) 10 MG tablet Take 10 mg by mouth daily.      Marland Kitchen etodolac (LODINE XL) 400 MG 24 hr tablet Take 400 mg by mouth daily.       . fenofibrate 160 MG tablet Take 1 tablet (160 mg total) by mouth daily.  30 tablet  5  . glyBURIDE-metformin (GLUCOVANCE) 5-500 MG per tablet Take 2 tablets by mouth 2 (two) times daily.      . insulin glargine (LANTUS) 100 UNIT/ML injection Inject 15 Units into the skin at bedtime.       . metoprolol succinate (TOPROL-XL) 25 MG 24 hr tablet Take 1 tablet (25 mg total) by mouth daily.  30 tablet  6  . nitroGLYCERIN (NITROSTAT) 0.4 MG SL tablet Place 1 tablet (0.4 mg total) under the tongue every 5 (five) minutes as needed for chest pain. If no relief call 911.  60 tablet  12  . omeprazole (PRILOSEC OTC) 20 MG tablet Take two tablets daily  60 tablet  11  . pravastatin (PRAVACHOL) 40 MG tablet Take 1 tablet (40 mg total) by mouth daily.  30 tablet  5   No current facility-administered medications for this visit.    Past Medical History  Diagnosis Date  . Coronary artery disease   .  Closed head injury 1975  . Myocardial infarction   . Hypertension   . Hyperlipemia   . Asthma     as a child  . Diabetes mellitus   . GERD (gastroesophageal reflux disease)   . Headache(784.0)   . Arthritis   . Morbid obesity   . Reflux     Chronic  . Hypertriglyceridemia   . Psoriasis   . DVT (deep venous thrombosis)   . CHF (congestive heart failure)     Past Surgical History  Procedure Laterality Date  . Arthroscopy left knee  1981  . Cardiac catheterization  2011  . Coronary stent placement  Nov  2011  . Colonoscopy  01/06/2012    Procedure: COLONOSCOPY;  Surgeon: [name], MD;  Location: [facility];  Service: Endoscopy;  Laterality: N/A;  7:30 AM    History   Social History  . Marital Status: Single    Spouse Name: N/A    Number of Children: N/A  . Years of Education: N/A   Occupational History  . Not on file.   Social History Main Topics  . Smoking status: Never Smoker   . Smokeless tobacco: Current User    Types: Snuff  . Alcohol Use: Yes     Comment: social  . Drug Use: No  . Sexual Activity: Yes   Other Topics Concern  . Not on file   Social History Narrative  . No narrative on file     Filed Vitals:   08/25/13 1334  Height: 6\' 2"  (1.88 m)  Weight: 335 lb (151.955 kg)   BP 147/79  Pulse 80   PHYSICAL EXAM General: NAD, obese  Neck: No JVD, no thyromegaly or thyroid nodule.  Lungs: Clear to auscultation bilaterally with normal respiratory effort.  CV: Nondisplaced PMI. Heart regular S1/S2, no S3/S4, soft I/VI systolic murmur along left sternal border. No peripheral edema. No carotid bruit. Normal pedal pulses.  Abdomen: Soft, obese.  Skin: Intact without lesions or rashes.  Neurologic: Alert and oriented x 3.  Psych: Normal affect.  Extremities: No clubbing or cyanosis.  HEENT: Normal.   ECG: reviewed and available in electronic records.   Nuclear stress test: FINDINGS: The patient was stressed according to [facility] protocol. The heart rate ranged from 65 beats per min up to 99 beats per min. The blood pressure range from 136/80 up to 162/75. No chest pain was reported.  The resting ECG showed normal sinus rhythm with a nonspecific T-wave abnormality in leads 1, aVL, and V5 through V6. There were no significant changes seen with stress, and no arrhythmias noted.  Analysis of the raw data showed no significant extracardiac radiotracer uptake. Analysis of the perfusion images revealed multiple defects. The first defect was a large, severely intense, completely reversible defect seen in the anteroapical wall, apical septal wall, and  apical inferoseptal wall. The second defect was a large, severely intense, completely reversible defect in the mid anteroseptal wall. The third defect was a large, severely intense, mostly fixed defect in the mid inferolateral and basal inferolateral walls. There was a mild to moderate degree of peri-infarct ischemia seen. Left ventricular systolic function was mildly reduced, with a calculated LV EF of 41%. The aforementioned defects were mildly hypokinetic. The summed stress score was 21, summed rest score 9, leading to a summed difference score of 12.  IMPRESSION: 1. Markedly abnormal Lexiscan Cardiolite stress test.  2. Multiple, completely reversible perfusion defects as noted above. There was an additional  area of myocardial scar in the mid to basal inferolateral walls with a mild to moderate degree of peri-infarct ischemia.  3. Mildly reduced left ventricular systolic function, calculated LV EF 41%.      ASSESSMENT AND PLAN: 1. Chest pain and palpitations, in the setting of known CAD with stenting of LAD and angioplasty of OM2: His symptoms are concerning for ischemic heart disease. Lexiscan Cardiolite showed multiple perfusion defects, and inferolateral scar with peri-infarct ischemia. Will proceed with left heart catheterization and coronary angiography. Continue aspirin, ACE inhibitor, and pravastatin. I encouraged him to use sublingual nitroglycerin as needed. I will increase Toprol-XL to 50 mg daily. 2. HTN: Mildly elevated today, but was controlled at last visit. Due to ischemic heart disease, I am increasing Toprol-XL to 50 mg daily, which may reduce BP as well. 3. Hyperlipidemia: Controlled on pravastatin 40 mg daily.   Dispo: f/u 4-6 weeks (after cath).   Kate Sable, M.D., F.A.C.C.

## 2013-08-27 NOTE — Progress Notes (Signed)
This encounter was created in error - please disregard.

## 2013-08-27 NOTE — Progress Notes (Signed)
TR BAND REMOVAL  LOCATION:  right radial  DEFLATED PER PROTOCOL:  yes  TIME BAND OFF / DRESSING APPLIED:   1500   SITE UPON ARRIVAL:   Level 0  SITE AFTER BAND REMOVAL:  Level 0  REVERSE ALLEN'S TEST:    positive  CIRCULATION SENSATION AND MOVEMENT:  Within Normal Limits  yes  COMMENTS:

## 2013-08-27 NOTE — Interval H&P Note (Signed)
History and Physical Interval Note:  08/27/2013 8:35 AM  Steven Crane  has presented today for surgery, with the diagnosis of Abnormal Stress Test, CAD, CP  The various methods of treatment have been discussed with the patient and family. After consideration of risks, benefits and other options for treatment, the patient has consented to  Procedure(s): LEFT HEART CATHETERIZATION WITH CORONARY ANGIOGRAM (N/A) as a surgical intervention .  The patient's history has been reviewed, patient examined, no change in status, stable for surgery.  I have reviewed the patient's chart and labs.  Questions were answered to the patient's satisfaction.    Cath Lab Visit (complete for each Cath Lab visit)  Clinical Evaluation Leading to the Procedure:   ACS: no  Non-ACS:    Anginal Classification: CCS III  Anti-ischemic medical therapy: Minimal Therapy (1 class of medications)  Non-Invasive Test Results: High-risk stress test findings: cardiac mortality >3%/year  Prior CABG: No previous CABG        [name]

## 2013-08-27 NOTE — CV Procedure (Signed)
Cardiac Catheterization Operative Report  Steven Crane [id number] 5/8/201510:01 AM [name], MD  Procedure Performed:  1. Left Heart Catheterization 2. Selective Coronary Angiography 3. Left ventricular angiogram 4. PTCA/DES x 1 proximal first obtuse marginal 5. PTCA/DES x 1 distal RCA  Operator: Lauree Chandler, MD  Arterial access site:  Right radial artery.   Indication:  53 yo male with history of CAD with prior DES LAD in 2011, PTCA [place] 2011 with recent chest pain c/w unstable angina. Stress myoview was high risk study with multiple areas of ischemia.                                      Procedure Details: The risks, benefits, complications, treatment options, and expected outcomes were discussed with the patient. The patient and/or family concurred with the proposed plan, giving informed consent. The patient was brought to the cath lab after IV hydration was begun and oral premedication was given. The patient was further sedated with Versed and Fentanyl. The right wrist was assessed with an Allens test which was positive. The right wrist was prepped and draped in a sterile fashion. 1% lidocaine was used for local anesthesia. Using the modified Seldinger access technique, a 5/6 French sheath was placed in the right radial artery. 3 mg Verapamil was given through the sheath. 6000 units IV heparin was given. Standard diagnostic catheters were used to perform selective coronary angiography. A pigtail catheter was used to perform a left ventricular angiogram. He was found to have severe stenosis in the first OM branch and in the distal RCA. I elected to proceed to PCI of both vessels.   PCI Note:   Lesion #1 Proximal OM1: He was given 600 Plavix po x 1. He was given an additional 5000 units IV heparin. ACT was over 400. I engaged the left main with a XB 3.5 guiding catheter. I then advanced a Cougar IC wire down the OM branch. A 2.5 x 12 mm balloon was used to pre-dilate the  stenosis. A 3.0 x 15 mm Resolute Integrity DES was placed in the proximal segment of OM1. The stent was post-dilated with a 3.25 x 12 mm Detmold balloon x 1. The stenosis was taken from 99% down to 0%.   Lesion #2 distal RCA: A JR4 was used to engage the RCA. A Cougar IC wire was advanced down the RCA. A 2.5 x 25 mm balloon was used to pre-dilate the distal RCA. A 3.0 x 38 mm Resolute Integrity DES was deployed in the distal RCA. The stent was post-dilated with a 3.25 x 12 mm [place]  Balloon x 3. The stenosis was taken from 99% down to 0%.   The sheath was removed from the right radial artery and a Terumo hemostasis band was applied at the arteriotomy site on the right wrist.    There were no immediate complications. The patient was taken to the recovery area in stable condition.   Hemodynamic Findings: Central aortic pressure: 116/67 Left ventricular pressure: 119/10/17  Angiographic Findings:  Left main: No obstructive disease.   Left Anterior Descending Artery: Large caliber vessel that courses to the apex. The mid stented segment is patent without restenosis. There is small caliber diagonal branch with ostial 70% stenosis, jailed by the LAD stent.   Circumflex Artery: Moderate caliber vessel with moderate caliber first OM branch, small caliber second OM branch. The  first OM branch has a focal 99% stenosis. The second OM branch is small with diffuse 30% stenosis.   Right Coronary Artery: Large dominant vessel with proximal and mid diffuse 20% stenosis, mild calcification. The distal vessel has a complex 99%, ulcerated appearing stenosis. The Posterlateral branch is moderate in caliber with multiple branches. The larger of the distal branches has very poor flow. The PDA is small in caliber with 60% stenosis.   Left Ventricular Angiogram: LVEF=40%  Impression: 1. Unstable angina 2. Severe stenosis distal RCA, now s/p successful PTCA/DES x 1 distal RCA 3. Severe stenosis first OM now s/p successful  PTCA/DES x 1 OM1 4. Mild to moderate LV systolic dysfunction  Recommendations: Will continue ASA and Plavix. Monitor overnight. D/C home in am if stable.        Complications:  None. The patient tolerated the procedure well.

## 2013-08-28 ENCOUNTER — Encounter (HOSPITAL_COMMUNITY): Payer: Self-pay | Admitting: Cardiology

## 2013-08-28 DIAGNOSIS — I10 Essential (primary) hypertension: Secondary | ICD-10-CM

## 2013-08-28 DIAGNOSIS — Z9861 Coronary angioplasty status: Secondary | ICD-10-CM

## 2013-08-28 DIAGNOSIS — R9439 Abnormal result of other cardiovascular function study: Secondary | ICD-10-CM

## 2013-08-28 DIAGNOSIS — I2589 Other forms of chronic ischemic heart disease: Secondary | ICD-10-CM

## 2013-08-28 DIAGNOSIS — I200 Unstable angina: Secondary | ICD-10-CM

## 2013-08-28 DIAGNOSIS — E785 Hyperlipidemia, unspecified: Secondary | ICD-10-CM

## 2013-08-28 DIAGNOSIS — E119 Type 2 diabetes mellitus without complications: Secondary | ICD-10-CM

## 2013-08-28 DIAGNOSIS — I2510 Atherosclerotic heart disease of native coronary artery without angina pectoris: Secondary | ICD-10-CM

## 2013-08-28 LAB — BASIC METABOLIC PANEL
BUN: 11 mg/dL (ref 6–23)
CHLORIDE: 102 meq/L (ref 96–112)
CO2: 24 meq/L (ref 19–32)
Calcium: 8.5 mg/dL (ref 8.4–10.5)
Creatinine, Ser: 0.72 mg/dL (ref 0.50–1.35)
GFR calc Af Amer: >90 mL/min (ref >90)
GFR calc non Af Amer: >90 mL/min (ref >90)
GLUCOSE: 188 mg/dL — AB (ref 70–99)
Potassium: 4.9 mEq/L (ref 3.7–5.3)
SODIUM: 136 meq/L — AB (ref 137–147)

## 2013-08-28 LAB — CBC
HEMATOCRIT: 34.8 % — AB (ref 39.0–52.0)
HEMOGLOBIN: 11.2 g/dL — AB (ref 13.0–17.0)
MCH: 25.6 pg — AB (ref 26.0–34.0)
MCHC: 32.2 g/dL (ref 30.0–36.0)
MCV: 79.5 fL (ref 78.0–100.0)
Platelets: 189 10*3/uL (ref 150–400)
RBC: 4.38 MIL/uL (ref 4.22–5.81)
RDW: 14.3 % (ref 11.5–15.5)
WBC: 5.5 10*3/uL (ref 4.0–10.5)

## 2013-08-28 LAB — GLUCOSE, CAPILLARY: Glucose-Capillary: 157 mg/dL — ABNORMAL HIGH (ref 70–99)

## 2013-08-28 MED ORDER — CLOPIDOGREL BISULFATE 75 MG PO TABS: 75.0000 mg | ORAL_TABLET | Freq: Every day | ORAL | Status: DC

## 2013-08-28 MED ORDER — GLYBURIDE-METFORMIN 5-500 MG PO TABS: 2.0000 | ORAL_TABLET | Freq: Two times a day (BID) | ORAL | Status: DC

## 2013-08-28 NOTE — Progress Notes (Signed)
The patient was seen and examined, and I agree with the assessment and plan as documented above, with modifications as noted below. Pt doing well this morning and without complaints, and is ready to go home. He is hemodynamically stable. I will f/u with him in the office in 2 weeks. He was reminded about the importance of medication compliance, particularly with respect to antiplatelet therapy. Will discharge to home today.

## 2013-08-28 NOTE — Progress Notes (Signed)
CARDIAC REHAB PHASE I   PRE:  Rate/Rhythm: 82 sinus  BP:  Sitting: 163/78   SaO2: 98 RA  MODE:  Ambulation: 600 ft   POST:  Rate/Rhythem: 102 sinus tach  BP:  Sitting: 185/77   SaO2: 98 RA  Pt ambulated 600 ft with assist x1.  Pt tolerated walk well with no c/o.  Completed pt d/c education: diet, exercise, restrictions, stent/Plavix, CP/NTG use/calling 911/MD.  Also reviewed CRPhII with pt request to send info to [facility].  Pt voiced understanding. Alberteen Sam, MA, ACSM RCEP [phone]   Clotilde Dieter

## 2013-08-28 NOTE — Discharge Instructions (Signed)
No driving for 3 days. No lifting over 5 lbs for 1 week. Keep procedure site clean & dry. If you notice increased pain, swelling, bleeding or pus, call/return!  You may shower, but no soaking baths/hot tubs/pools for 1 week.  ° °

## 2013-08-28 NOTE — Discharge Summary (Signed)
CARDIOLOGY DISCHARGE SUMMARY   Patient ID: Steven Crane,  MRN: [id number], DOB/AGE: July 16, 1960 53 y.o.  Admit date: 08/27/2013 Discharge date: 08/28/2013  Primary Care Physician: Wolfgang Phoenix, MD Primary Cardiologist: Bronson Ing, MD  Primary Discharge Diagnoses:  Unstable angina  CAD s/p successful PTCA/DES x 1 to distal RCA and PTCA/DES x 1 to OM-1  LV dysfunction, EF 40%   Secondary Discharge Diagnoses:  Hypertension  Dyslipidemia  Obesity  Procedures This Admission:  Cardiac catheterization  PCI Note:  Lesion #1 Proximal OM1: He was given 600 Plavix po x 1. He was given an additional 5000 units IV heparin. ACT was over 400. I engaged the left main with a XB 3.5 guiding catheter. I then advanced a Cougar IC wire down the OM branch. A 2.5 x 12 mm balloon was used to pre-dilate the stenosis. A 3.0 x 15 mm Resolute Integrity DES was placed in the proximal segment of OM1. The stent was post-dilated with a 3.25 x 12 mm [place] balloon x 1. The stenosis was taken from 99% down to 0%.  Lesion #2 distal RCA: A JR4 was used to engage the RCA. A Cougar IC wire was advanced down the RCA. A 2.5 x 25 mm balloon was used to pre-dilate the distal RCA. A 3.0 x 38 mm Resolute Integrity DES was deployed in the distal RCA. The stent was post-dilated with a 3.25 x 12 mm [place] Balloon x 3. The stenosis was taken from 99% down to 0%.  The sheath was removed from the right radial artery and a Terumo hemostasis band was applied at the arteriotomy site on the right wrist.  There were no immediate complications. The patient was taken to the recovery area in stable condition.  Hemodynamic Findings:  Central aortic pressure: 116/67  Left ventricular pressure: 119/10/17  Angiographic Findings:  Left main: No obstructive disease.  Left Anterior Descending Artery: Large caliber vessel that courses to the apex. The mid stented segment is patent without restenosis. There is small caliber diagonal branch with ostial 70% stenosis,  jailed by the LAD stent.  Circumflex Artery: Moderate caliber vessel with moderate caliber first OM branch, small caliber second OM branch. The first OM branch has a focal 99% stenosis. The second OM branch is small with diffuse 30% stenosis.  Right Coronary Artery: Large dominant vessel with proximal and mid diffuse 20% stenosis, mild calcification. The distal vessel has a complex 99%, ulcerated appearing stenosis. The Posterlateral branch is moderate in caliber with multiple branches. The larger of the distal branches has very poor flow. The PDA is small in caliber with 60% stenosis.  Left Ventricular Angiogram: LVEF=40%  Impression:  1. Unstable angina  2. Severe stenosis distal RCA, now s/p successful PTCA/DES x 1 distal RCA  3. Severe stenosis first OM now s/p successful PTCA/DES x 1 OM1  4. Mild to moderate LV systolic dysfunction  Recommendations: Will continue ASA and Plavix. Monitor overnight. D/C home in am if stable.  Complications: None. The patient tolerated the procedure well.   History and Hospital Course:  Steven Crane is a 53 year old man with known CAD and prior PCI who was recently evaluated by Dr. Bronson Ing as an outpatient for chest pain. Nuclear stress testing revealed multiple reversible perfusion defects as detailed below, as well as a mild to moderate degree of inferolateral wall peri-infarct ischemia. Cardiac catheterization was recommended. On 08/27/2013 he underwent cardiac catheterization which demonstrated severe stenoses involving the distal RCA and OM-1 and he is now s/p  successful PTCA/DES x 1 to both the distal RCA and OM-1. Mild to moderate LV dysfunction, EF 40%. Please see procedure note outline above for full details. Steven Crane tolerated this procedure well without any immediate complication. He remains hemodynamically stable and afebrile. His right radial access site is intact without significant bleeding or hematoma. He has been given discharge instructions including  wound care and activity restrictions. He will follow-up in clinic in 2 weeks. Of note, he will continue guideline-directed medical therapy for LV dysfunction / CHF including metoprolol succinate and ACEI. He has been seen, examined and deemed stable for discharge today by Dr. Bronson Ing.   Discharge Vitals: Blood pressure 134/72, pulse 68, temperature 98.3 F (36.8 C), temperature source Oral, resp. rate 18, height 6\' 2"  (1.88 m), weight 336 lb 6.8 oz (152.6 kg), SpO2 98.00%.   Labs: Lab Results  Component Value Date   WBC 5.5 08/28/2013   HGB 11.2* 08/28/2013   HCT 34.8* 08/28/2013   MCV 79.5 08/28/2013   PLT 189 08/28/2013     Recent Labs Lab 08/28/13 0511  NA 136*  K 4.9  CL 102  CO2 24  BUN 11  CREATININE 0.72  CALCIUM 8.5  GLUCOSE 188*   Lab Results  Component Value Date   CKTOTAL 71 03/20/2010   CKMB 1.5 03/20/2010   TROPONINI <0.30 08/07/2013     Recent Labs  08/27/13 0728  INR 1.03    Disposition:  The patient is being discharged in stable condition.  Follow-up:     Follow-up Information   Follow up with Herminio Commons, MD On 09/14/2013. (At 10:20 AM for hospital follow-up)    Specialty:  Cardiology   Contact information:   [address] [place] [address] [phone]      Discharge Medications:    Medication List         acetaminophen 500 MG tablet  Commonly known as:  TYLENOL  Take 1,000 mg by mouth 2 (two) times daily.     ALPRAZolam 0.5 MG tablet  Commonly known as:  XANAX  Take 0.25-0.5 mg by mouth daily as needed for anxiety (*Normally takes at bedtime but will take during the day as needed).     aspirin EC 81 MG tablet  Take 81 mg by mouth every evening.     clopidogrel 75 MG tablet  Commonly known as:  PLAVIX  Take 1 tablet (75 mg total) by mouth daily with breakfast.     enalapril 10 MG tablet  Commonly known as:  VASOTEC  Take 10 mg by mouth daily.     fenofibrate 160 MG tablet  Take 1 tablet (160 mg total) by mouth  daily.     glyBURIDE-metformin 5-500 MG per tablet  Commonly known as:  GLUCOVANCE  Take 2 tablets by mouth 2 (two) times daily. HOLD for 2 days. Restart Monday, 08/30/2013.     insulin glargine 100 UNIT/ML injection  Commonly known as:  LANTUS  Inject 15 Units into the skin at bedtime.     metoprolol succinate 50 MG 24 hr tablet  Commonly known as:  TOPROL-XL  Take 1 tablet (50 mg total) by mouth daily.     nitroGLYCERIN 0.4 MG SL tablet  Commonly known as:  NITROSTAT  Place 1 tablet (0.4 mg total) under the tongue every 5 (five) minutes as needed for chest pain. If no relief call 911.     omeprazole 20 MG tablet  Commonly known as:  PRILOSEC  OTC  Take two tablets daily     pravastatin 40 MG tablet  Commonly known as:  PRAVACHOL  Take 1 tablet (40 mg total) by mouth daily.       Duration of Discharge Encounter: Greater than 30 minutes including physician time.  Darrick Huntsman, PA-C 08/28/2013, 12:48 PM

## 2013-08-28 NOTE — Progress Notes (Signed)
Patient: Steven Crane Date of Encounter: 08/28/2013, 7:49 AM Admit date: 08/27/2013     Subjective  Mr. Moodie has no complaints this AM. He denies CP or SOB.    Objective  Physical Exam: Vitals: BP 145/78  Pulse 72  Temp(Src) 98.5 F (36.9 C) (Oral)  Resp 18  Ht 6\' 2"  (1.88 m)  Wt 336 lb 6.8 oz (152.6 kg)  BMI 43.18 kg/m2  SpO2 98% General: Well developed, well appearing 53 year old male in no acute distress. Neck: Supple. JVD not elevated. Lungs: Clear bilaterally to auscultation without wheezes, rales, or rhonchi. Breathing is unlabored. Heart: RRR S1 S2 without murmurs, rubs, or gallops.  Abdomen: Soft, non-distended. Extremities: No clubbing or cyanosis. No edema.  Distal pedal pulses are 2+ and equal bilaterally. Right radial site intact without bleeding or hematoma. Neuro: Alert and oriented X 3. Moves all extremities spontaneously. No focal deficits.  Intake/Output:  Intake/Output Summary (Last 24 hours) at 08/28/13 0749 Last data filed at 08/28/13 0656  Gross per 24 hour  Intake 1423.75 ml  Output   2550 ml  Net -1126.25 ml    Inpatient Medications:  . aspirin EC  81 mg Oral QPM  . clopidogrel  75 mg Oral Q breakfast  . enalapril  10 mg Oral Daily  . fenofibrate  160 mg Oral Daily  . insulin aspart  0-15 Units Subcutaneous TID WC  . insulin glargine  15 Units Subcutaneous QHS  . metoprolol succinate  50 mg Oral Daily  . simvastatin  40 mg Oral q1800    Labs:  Recent Labs  08/27/13 0728 08/28/13 0511  NA 139 136*  K 4.2 4.9  CL 104 102  CO2 21 24  GLUCOSE 116* 188*  BUN 14 11  CREATININE 0.67 0.72  CALCIUM 8.7 8.5    Recent Labs  08/27/13 0728 08/28/13 0511  WBC 4.7 5.5  HGB 11.6* 11.2*  HCT 35.3* 34.8*  MCV 77.9* 79.5  PLT 208 189    Recent Labs  08/27/13 0728  INR 1.03    Radiology/Studies:  Cardiac catheterization PCI Note:  Lesion #1 Proximal OM1: He was given 600 Plavix po x 1. He was given an additional 5000 units IV  heparin. ACT was over 400. I engaged the left main with a XB 3.5 guiding catheter. I then advanced a Cougar IC wire down the OM branch. A 2.5 x 12 mm balloon was used to pre-dilate the stenosis. A 3.0 x 15 mm Resolute Integrity DES was placed in the proximal segment of OM1. The stent was post-dilated with a 3.25 x 12 mm [place] balloon x 1. The stenosis was taken from 99% down to 0%.  Lesion #2 distal RCA: A JR4 was used to engage the RCA. A Cougar IC wire was advanced down the RCA. A 2.5 x 25 mm balloon was used to pre-dilate the distal RCA. A 3.0 x 38 mm Resolute Integrity DES was deployed in the distal RCA. The stent was post-dilated with a 3.25 x 12 mm [place] Balloon x 3. The stenosis was taken from 99% down to 0%.  The sheath was removed from the right radial artery and a Terumo hemostasis band was applied at the arteriotomy site on the right wrist.  There were no immediate complications. The patient was taken to the recovery area in stable condition.  Hemodynamic Findings:  Central aortic pressure: 116/67  Left ventricular pressure: 119/10/17  Angiographic Findings:  Left main: No obstructive disease.  Left Anterior Descending Artery: Large caliber vessel that courses to the apex. The mid stented segment is patent without restenosis. There is small caliber diagonal branch with ostial 70% stenosis, jailed by the LAD stent.  Circumflex Artery: Moderate caliber vessel with moderate caliber first OM branch, small caliber second OM branch. The first OM branch has a focal 99% stenosis. The second OM branch is small with diffuse 30% stenosis.  Right Coronary Artery: Large dominant vessel with proximal and mid diffuse 20% stenosis, mild calcification. The distal vessel has a complex 99%, ulcerated appearing stenosis. The Posterlateral branch is moderate in caliber with multiple branches. The larger of the distal branches has very poor flow. The PDA is small in caliber with 60% stenosis.  Left Ventricular Angiogram:  LVEF=40%  Impression:  1. Unstable angina  2. Severe stenosis distal RCA, now s/p successful PTCA/DES x 1 distal RCA  3. Severe stenosis first OM now s/p successful PTCA/DES x 1 OM1  4. Mild to moderate LV systolic dysfunction  Recommendations: Will continue ASA and Plavix. Monitor overnight. D/C home in am if stable.  Complications: None. The patient tolerated the procedure well.    Telemetry: SR; no arrhythmias   Assessment and Plan  Unstable angina CAD s/p successful PTCA/DES x 1 to distal RCA and PTCA/DES x 1 to OM-1 LV dysfunction, EF 40% Hypertension Dyslipidemia Obesity  Mr. Vanderlinden is doing well post cardiac cath +PCI. His right radial site is intact without bleeding or hematoma. He is hemodynamically stable and no arrhythmias on tele. Continue medical therapy including aspirin and Plavix for at least one year. Continue metoprolol succinate and ACEI in setting of LV dysfunction. Hold metformin for 3 days. Plan for DC home today.  Signed, Azzie Roup Romulo Okray PA-C

## 2013-09-02 ENCOUNTER — Encounter (HOSPITAL_COMMUNITY)
Admission: RE | Admit: 2013-09-02 | Discharge: 2013-09-02 | Disposition: A | Discharge status: Home / Self Care | Payer: BC Managed Care – PPO | Source: Ambulatory Visit | Attending: Cardiovascular Disease | Admitting: Cardiovascular Disease

## 2013-09-02 ENCOUNTER — Encounter (HOSPITAL_COMMUNITY): Payer: Self-pay

## 2013-09-02 VITALS — BP 120/82 | HR 75 | Ht 74.0 in | Wt 333.2 lb

## 2013-09-02 DIAGNOSIS — Z9861 Coronary angioplasty status: Secondary | ICD-10-CM | POA: Insufficient documentation

## 2013-09-02 DIAGNOSIS — I2510 Atherosclerotic heart disease of native coronary artery without angina pectoris: Secondary | ICD-10-CM | POA: Insufficient documentation

## 2013-09-02 DIAGNOSIS — Z5189 Encounter for other specified aftercare: Secondary | ICD-10-CM | POA: Insufficient documentation

## 2013-09-02 NOTE — Patient Instructions (Signed)
Pt has finished orientation and is scheduled to start CR on 09/06/13 at 6:45am. Pt has been instructed to arrive to class 15 minutes early for scheduled class. Pt has been instructed to wear comfortable clothing and shoes with rubber soles. Pt has been told to take their medications 1 hour prior to coming to class.  If the patient is not going to attend class, he/she has been instructed to call.

## 2013-09-02 NOTE — Progress Notes (Signed)
Patient was referred to Cardiac Rehab by Dr. Bronson Ing due to Stentx2 V45.82. During orientation advised patient on arrival and appointment times what to wear, what to do before, during and after exercise. Reviewed attendance and class policy. Talked about inclement weather and class consultation policy. Pt is scheduled to start Cardiac Rehab on 09/06/13 at 6:45. Pt was advised to come to class 5 minutes before class starts. He was also given instructions on meeting with the dietician and attending the Family Structure classes. Pt is eager to get started. Patient was able to complete 6 minute walk test.

## 2013-09-06 ENCOUNTER — Encounter (HOSPITAL_COMMUNITY)
Admission: RE | Admit: 2013-09-06 | Discharge: 2013-09-06 | Disposition: A | Discharge status: Home / Self Care | Payer: BC Managed Care – PPO | Source: Ambulatory Visit | Attending: Cardiovascular Disease | Admitting: Cardiovascular Disease

## 2013-09-08 ENCOUNTER — Encounter (HOSPITAL_COMMUNITY)
Admission: RE | Admit: 2013-09-08 | Discharge: 2013-09-08 | Disposition: A | Discharge status: Home / Self Care | Payer: BC Managed Care – PPO | Source: Ambulatory Visit | Attending: Cardiovascular Disease | Admitting: Cardiovascular Disease

## 2013-09-10 ENCOUNTER — Encounter (HOSPITAL_COMMUNITY)
Admission: RE | Admit: 2013-09-10 | Discharge: 2013-09-10 | Disposition: A | Discharge status: Home / Self Care | Payer: BC Managed Care – PPO | Source: Ambulatory Visit | Attending: Cardiovascular Disease | Admitting: Cardiovascular Disease

## 2013-09-13 ENCOUNTER — Encounter (HOSPITAL_COMMUNITY): Payer: BC Managed Care – PPO

## 2013-09-14 ENCOUNTER — Encounter: Payer: Self-pay | Admitting: Cardiovascular Disease

## 2013-09-14 ENCOUNTER — Ambulatory Visit (INDEPENDENT_AMBULATORY_CARE_PROVIDER_SITE_OTHER): Payer: BC Managed Care – PPO | Admitting: Cardiovascular Disease

## 2013-09-14 VITALS — BP 147/85 | HR 76 | Ht 74.0 in | Wt 330.0 lb

## 2013-09-14 DIAGNOSIS — I10 Essential (primary) hypertension: Secondary | ICD-10-CM

## 2013-09-14 DIAGNOSIS — Z9861 Coronary angioplasty status: Secondary | ICD-10-CM

## 2013-09-14 DIAGNOSIS — E785 Hyperlipidemia, unspecified: Secondary | ICD-10-CM

## 2013-09-14 DIAGNOSIS — Z955 Presence of coronary angioplasty implant and graft: Secondary | ICD-10-CM

## 2013-09-14 DIAGNOSIS — Z7182 Exercise counseling: Secondary | ICD-10-CM

## 2013-09-14 DIAGNOSIS — I2510 Atherosclerotic heart disease of native coronary artery without angina pectoris: Secondary | ICD-10-CM

## 2013-09-14 NOTE — Patient Instructions (Signed)
Continue all current medications. Work note provided today Follow up in  3 months

## 2013-09-14 NOTE — Progress Notes (Signed)
Patient ID: Steven Crane, male   DOB: 11-20-1960, 53 y.o.   MRN: [id number]      SUBJECTIVE: The patient is here to followup after recently undergoing drug-eluting stent placement to the proximal first obtuse marginal branch as well as the distal RCA. Is been participating in cardiac rehabilitation. Other than one or 2 mild chest twinges lasting only seconds, he has had no symptoms whatsoever. 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 He says that his blood pressure has been running in the 119 systolic range during cardiac rehabilitation. He has lost 6 pounds.  Allergies  Allergen Reactions  . Adhesive [Tape] Rash    Current Outpatient Prescriptions  Medication Sig Dispense Refill  . acetaminophen (TYLENOL) 500 MG tablet Take 1,000 mg by mouth 2 (two) times daily.      Marland Kitchen ALPRAZolam (XANAX) 0.5 MG tablet Take 0.25-0.5 mg by mouth daily as needed for anxiety (*Normally takes at bedtime but will take during the day as needed).      Marland Kitchen aspirin EC 81 MG tablet Take 81 mg by mouth every evening.      . clopidogrel (PLAVIX) 75 MG tablet Take 1 tablet (75 mg total) by mouth daily with breakfast.  30 tablet  11  . enalapril (VASOTEC) 10 MG tablet Take 10 mg by mouth daily.      . fenofibrate 160 MG tablet Take 1 tablet (160 mg total) by mouth daily.  30 tablet  5  . glyBURIDE-metformin (GLUCOVANCE) 5-500 MG per tablet Take 2 tablets by mouth 2 (two) times daily. HOLD for 2 days. Restart Monday, 08/30/2013.      . insulin glargine (LANTUS) 100 UNIT/ML injection Inject 15 Units into the skin at bedtime.       . metoprolol succinate (TOPROL-XL) 50 MG 24 hr tablet Take 1 tablet (50 mg total) by mouth daily.  30 tablet  6  . nitroGLYCERIN (NITROSTAT) 0.4 MG SL tablet Place 1 tablet (0.4 mg total) under the tongue every 5 (five) minutes as needed for chest pain. If no relief call 911.  60 tablet  12  . omeprazole (PRILOSEC OTC) 20 MG tablet  Take two tablets daily  60 tablet  11  . pravastatin (PRAVACHOL) 40 MG tablet Take 1 tablet (40 mg total) by mouth daily.  30 tablet  5   No current facility-administered medications for this visit.    Past Medical History  Diagnosis Date  . Coronary artery disease May 2015    s/p successful PTCA/DES x 1 to distal RCA and PTCA/DES x 1 to OM-20 Aug 2013  . Closed head injury 1975  . Hypertension   . Hyperlipemia   . GERD (gastroesophageal reflux disease)   . Morbid obesity   . Hypertriglyceridemia   . Psoriasis   . DVT (deep venous thrombosis) 2008    "LLE"  . Myocardial infarction 02/2010  . CHF (congestive heart failure) 02/2010  . Asthma   . Chronic bronchitis     "get it q year"  . Type II diabetes mellitus   . Headache(784.0)     "comes and goes; sometimes qd; sometimes weekly" (08/27/2013)  . Arthritis     "hands, back, knees" (08/27/2013)  . Chronic lower back pain   . LV dysfunction     LVEF 40% at time of cardiac cath May 2015    Past Surgical History  Procedure Laterality Date  . Knee  arthroscopy Left 1981  . Colonoscopy  01/06/2012    Procedure: COLONOSCOPY;  Surgeon: [name], MD;  Location: [facility];  Service: Endoscopy;  Laterality: N/A;  7:30 AM  . Coronary angioplasty with stent placement  02/2010; 08/27/2013    "1 + 3"     History   Social History  . Marital Status: Single    Spouse Name: N/A    Number of Children: N/A  . Years of Education: N/A   Occupational History  . Not on file.   Social History Main Topics  . Smoking status: Never Smoker   . Smokeless tobacco: Current User    Types: Snuff, Chew     Comment: "started dipping @ age 29; quit for 5 1/2 yrs at one time; hadn't chewed in awhile"  . Alcohol Use: Yes     Comment: 08/27/2013 "drink beer and liquor; don't drink much"  . Drug Use: No  . Sexual Activity: Yes   Other Topics Concern  . Not on file   Social History Narrative  . No narrative on file     Filed Vitals:    09/14/13 1006  BP: 147/85  Pulse: 76  Height: 6\' 2"  (1.88 m)  Weight: 330 lb (149.687 kg)    PHYSICAL EXAM General: NAD, morbidly obese Neck: No JVD, no thyromegaly. Lungs: Clear to auscultation bilaterally with normal respiratory effort. CV: Nondisplaced PMI.  Regular rate and rhythm, normal S1/S2, no S3/S4, no murmur. No pretibial or periankle edema.  No carotid bruit.  Normal pedal pulses.  Abdomen: Soft, nontender, no hepatosplenomegaly, no distention.  Neurologic: Alert and oriented x 3.  Psych: Normal affect. Extremities: No clubbing or cyanosis.   ECG: reviewed and available in electronic records.      ASSESSMENT AND PLAN: 1. CAD s/p DES x 2 to proximal OM1 and distal RCA: Symptomatically stable. Continue aspirin, Plavix, metoprolol, and pravastatin. Continue with cardiac rehabilitation. I informed him that he should not lift anything heavier than 10-15 pounds over the next 2 months minimum. 2. HTN: Mildly elevated today but reportedly has been under good control during cardiac rehabilitation. Will continue to monitor. If it remains elevated, I would increase the dose of enalapril. 3. Hyperlipidemia: Continue pravastatin 40 mg daily. 4. Exercise counseling provided.  Dispo: f/u 3 months.  Kate Sable, M.D., F.A.C.C.

## 2013-09-15 ENCOUNTER — Encounter (HOSPITAL_COMMUNITY)
Admission: RE | Admit: 2013-09-15 | Discharge: 2013-09-15 | Disposition: A | Discharge status: Home / Self Care | Payer: BC Managed Care – PPO | Source: Ambulatory Visit | Attending: Cardiovascular Disease | Admitting: Cardiovascular Disease

## 2013-09-15 ENCOUNTER — Other Ambulatory Visit: Payer: Self-pay | Admitting: Family Medicine

## 2013-09-17 ENCOUNTER — Other Ambulatory Visit: Payer: Self-pay | Admitting: Family Medicine

## 2013-09-17 ENCOUNTER — Encounter (HOSPITAL_COMMUNITY)
Admission: RE | Admit: 2013-09-17 | Discharge: 2013-09-17 | Disposition: A | Discharge status: Home / Self Care | Payer: BC Managed Care – PPO | Source: Ambulatory Visit | Attending: Cardiovascular Disease | Admitting: Cardiovascular Disease

## 2013-09-17 ENCOUNTER — Telehealth: Payer: Self-pay | Admitting: Cardiovascular Disease

## 2013-09-20 ENCOUNTER — Encounter (HOSPITAL_COMMUNITY)
Admission: RE | Admit: 2013-09-20 | Discharge: 2013-09-20 | Disposition: A | Discharge status: Home / Self Care | Payer: BC Managed Care – PPO | Source: Ambulatory Visit | Attending: Cardiovascular Disease | Admitting: Cardiovascular Disease

## 2013-09-20 DIAGNOSIS — Z5189 Encounter for other specified aftercare: Secondary | ICD-10-CM | POA: Insufficient documentation

## 2013-09-20 DIAGNOSIS — I2510 Atherosclerotic heart disease of native coronary artery without angina pectoris: Secondary | ICD-10-CM | POA: Insufficient documentation

## 2013-09-20 DIAGNOSIS — Z9861 Coronary angioplasty status: Secondary | ICD-10-CM | POA: Insufficient documentation

## 2013-09-20 NOTE — Telephone Encounter (Signed)
Can you assist this patient with FMLA?

## 2013-09-21 NOTE — Telephone Encounter (Signed)
Nikki,  Have you heard back about this gentleman's FMLA?  He is the one that [facility] sent him a bill and he had filled out the [place] authorization

## 2013-09-22 ENCOUNTER — Encounter (HOSPITAL_COMMUNITY)
Admission: RE | Admit: 2013-09-22 | Discharge: 2013-09-22 | Disposition: A | Discharge status: Home / Self Care | Payer: BC Managed Care – PPO | Source: Ambulatory Visit | Attending: Cardiovascular Disease | Admitting: Cardiovascular Disease

## 2013-09-24 ENCOUNTER — Encounter (HOSPITAL_COMMUNITY)
Admission: RE | Admit: 2013-09-24 | Discharge: 2013-09-24 | Disposition: A | Discharge status: Home / Self Care | Payer: BC Managed Care – PPO | Source: Ambulatory Visit | Attending: Cardiovascular Disease | Admitting: Cardiovascular Disease

## 2013-09-27 ENCOUNTER — Encounter (HOSPITAL_COMMUNITY)
Admission: RE | Admit: 2013-09-27 | Discharge: 2013-09-27 | Disposition: A | Discharge status: Home / Self Care | Payer: BC Managed Care – PPO | Source: Ambulatory Visit | Attending: Cardiovascular Disease | Admitting: Cardiovascular Disease

## 2013-09-29 ENCOUNTER — Encounter (HOSPITAL_COMMUNITY)
Admission: RE | Admit: 2013-09-29 | Discharge: 2013-09-29 | Disposition: A | Discharge status: Home / Self Care | Payer: BC Managed Care – PPO | Source: Ambulatory Visit | Attending: Cardiovascular Disease | Admitting: Cardiovascular Disease

## 2013-10-01 ENCOUNTER — Encounter (HOSPITAL_COMMUNITY)
Admission: RE | Admit: 2013-10-01 | Discharge: 2013-10-01 | Disposition: A | Discharge status: Home / Self Care | Payer: BC Managed Care – PPO | Source: Ambulatory Visit | Attending: Cardiovascular Disease | Admitting: Cardiovascular Disease

## 2013-10-04 ENCOUNTER — Encounter (HOSPITAL_COMMUNITY)
Admission: RE | Admit: 2013-10-04 | Discharge: 2013-10-04 | Disposition: A | Discharge status: Home / Self Care | Payer: BC Managed Care – PPO | Source: Ambulatory Visit | Attending: Cardiovascular Disease | Admitting: Cardiovascular Disease

## 2013-10-06 ENCOUNTER — Encounter (HOSPITAL_COMMUNITY): Payer: BC Managed Care – PPO

## 2013-10-08 ENCOUNTER — Encounter (HOSPITAL_COMMUNITY)
Admission: RE | Admit: 2013-10-08 | Discharge: 2013-10-08 | Disposition: A | Discharge status: Home / Self Care | Payer: BC Managed Care – PPO | Source: Ambulatory Visit | Attending: Cardiovascular Disease | Admitting: Cardiovascular Disease

## 2013-10-11 ENCOUNTER — Encounter (HOSPITAL_COMMUNITY)
Admission: RE | Admit: 2013-10-11 | Discharge: 2013-10-11 | Disposition: A | Discharge status: Home / Self Care | Payer: BC Managed Care – PPO | Source: Ambulatory Visit | Attending: Cardiovascular Disease | Admitting: Cardiovascular Disease

## 2013-10-13 ENCOUNTER — Encounter (HOSPITAL_COMMUNITY)
Admission: RE | Admit: 2013-10-13 | Discharge: 2013-10-13 | Disposition: A | Discharge status: Home / Self Care | Payer: BC Managed Care – PPO | Source: Ambulatory Visit | Attending: Cardiovascular Disease | Admitting: Cardiovascular Disease

## 2013-10-15 ENCOUNTER — Ambulatory Visit (INDEPENDENT_AMBULATORY_CARE_PROVIDER_SITE_OTHER): Payer: BC Managed Care – PPO | Admitting: Family Medicine

## 2013-10-15 ENCOUNTER — Encounter (HOSPITAL_COMMUNITY)
Admission: RE | Admit: 2013-10-15 | Discharge: 2013-10-15 | Disposition: A | Discharge status: Home / Self Care | Payer: BC Managed Care – PPO | Source: Ambulatory Visit | Attending: Cardiovascular Disease | Admitting: Cardiovascular Disease

## 2013-10-15 ENCOUNTER — Encounter: Payer: Self-pay | Admitting: Family Medicine

## 2013-10-15 VITALS — BP 128/84 | Ht 74.0 in | Wt 333.0 lb

## 2013-10-15 DIAGNOSIS — E785 Hyperlipidemia, unspecified: Secondary | ICD-10-CM

## 2013-10-15 DIAGNOSIS — I10 Essential (primary) hypertension: Secondary | ICD-10-CM

## 2013-10-15 DIAGNOSIS — E119 Type 2 diabetes mellitus without complications: Secondary | ICD-10-CM

## 2013-10-15 DIAGNOSIS — Z79899 Other long term (current) drug therapy: Secondary | ICD-10-CM

## 2013-10-15 LAB — POCT GLYCOSYLATED HEMOGLOBIN (HGB A1C): Hemoglobin A1C: 8

## 2013-10-15 MED ORDER — PRAVASTATIN SODIUM 40 MG PO TABS: ORAL_TABLET | ORAL | Status: DC

## 2013-10-15 MED ORDER — INSULIN GLARGINE 100 UNIT/ML ~~LOC~~ SOLN: 22.0000 [IU] | Freq: Every day | SUBCUTANEOUS | Status: DC

## 2013-10-15 MED ORDER — FENOFIBRATE 160 MG PO TABS: ORAL_TABLET | ORAL | Status: DC

## 2013-10-15 MED ORDER — GLYBURIDE-METFORMIN 5-500 MG PO TABS: ORAL_TABLET | ORAL | Status: DC

## 2013-10-15 MED ORDER — ALPRAZOLAM 0.5 MG PO TABS: 0.2500 mg | ORAL_TABLET | Freq: Every day | ORAL | Status: DC | PRN

## 2013-10-15 NOTE — Progress Notes (Signed)
   Subjective:    Patient ID: Steven Crane, male    DOB: 1961-03-15, 53 y.o.   MRN: [id number]  Diabetes He presents for his follow-up diabetic visit. He has type 2 diabetes mellitus. His disease course has been stable. There are no hypoglycemic associated symptoms. There are no diabetic associated symptoms. There are no hypoglycemic complications. Symptoms are stable. There are no diabetic complications. There are no known risk factors for coronary artery disease. Current diabetic treatment includes insulin injections and oral agent (monotherapy).  A1C today is 8.0. Morning sugar 140s  Results for orders placed in visit on 10/15/13  POCT GLYCOSYLATED HEMOGLOBIN (HGB A1C)      Result Value Ref Range   Hemoglobin A1C 8.0      Patient states he has no concerns at this time. He is doing well.   BP good when ck'ed elsewhere. Compliant with medications in this regard.  Some lateral foot pain both sides. No numbness or tingling.  Compliant with all meds  Had stents placed woith two major high grade blockages. Handling well.  Claims compliance with her lipid medication. No obvious side effects.    Review of Systems No current chest pain shortness breath abdominal pain change in bowel habits no blood in stool no rash ROS otherwise negative    Objective:   Physical Exam  Alert vital stable and reviewed. HEENT normal. Lungs clear heart regular in rhythm ankles without edema      Assessment & Plan:  Impression type 2 diabetes suboptimum discuss. #2 hypertension good control. #3 coronary artery disease with recent exacerbation discuss. #4 hyperlipidemia discussed plan increase Lantus to 22 units. Diet exercise discussed. Maintain same meds. Recheck in several months. Blood work then. WSL

## 2013-10-16 LAB — LIPID PANEL
Cholesterol: 129 mg/dL (ref 0–200)
HDL: 24 mg/dL — ABNORMAL LOW
LDL Cholesterol: 64 mg/dL (ref 0–99)
Total CHOL/HDL Ratio: 5.4 ratio
Triglycerides: 205 mg/dL — ABNORMAL HIGH
VLDL: 41 mg/dL — ABNORMAL HIGH (ref 0–40)

## 2013-10-16 LAB — MICROALBUMIN, URINE: Microalb, Ur: 0.5 mg/dL (ref 0.00–1.89)

## 2013-10-16 LAB — HEPATIC FUNCTION PANEL
ALT: 35 U/L (ref 0–53)
AST: 22 U/L (ref 0–37)
Albumin: 3.5 g/dL (ref 3.5–5.2)
Alkaline Phosphatase: 36 U/L — ABNORMAL LOW (ref 39–117)
Bilirubin, Direct: 0.1 mg/dL (ref 0.0–0.3)
Indirect Bilirubin: 0.2 mg/dL (ref 0.2–1.2)
Total Bilirubin: 0.3 mg/dL (ref 0.2–1.2)
Total Protein: 6 g/dL (ref 6.0–8.3)

## 2013-10-18 ENCOUNTER — Encounter (HOSPITAL_COMMUNITY)
Admission: RE | Admit: 2013-10-18 | Discharge: 2013-10-18 | Disposition: A | Discharge status: Home / Self Care | Payer: BC Managed Care – PPO | Source: Ambulatory Visit | Attending: Cardiovascular Disease | Admitting: Cardiovascular Disease

## 2013-10-19 NOTE — Progress Notes (Signed)
Cardiac Rehabilitation Program Outcomes Report   Orientation:  09/02/2013 1st week report: 09/10/2013 Graduate Date:  tbd  Discharge Date:  tbd # of sessions completed: 3 DX: CAD and Stent X 2  Cardiologist: Gaynelle Cage MD:  Mickie Hillier Class Time:  06.45  A.  Exercise Program:  Tolerates exercise @ 3.96 METS for 15 minutes and Walk Test Results:  Pre: Pre walk test: resting HR 75, BP 120/82, O2 97%,RPE 8 and RPD 8, 6 minute HR 100, BP 180/90, O2 96%, RPE 13 and RPD 13, Post HR 78, BP 120/82, O2 99%, RPE 10 and RPE 10 walked 1400 feet at 2.65 mph at 3.0 METS.  B.  Mental Health:  Good mental attitude  C.  Education/Instruction/Skills  Accurately checks own pulse.  Rest:  64  Exercise:  89, Knows THR for exercise and Uses Perceived Exertion Scale and/or Dyspnea Scale  Uses Perceived Exertion Scale and/or Dyspnea Scale  D.  Nutrition/Weight Control/Body Composition:  Adherence to prescribed nutrition program: good    E.  Blood Lipids    Lab Results  Component Value Date   CHOL 129 10/15/2013   HDL 24* 10/15/2013   LDLCALC 64 10/15/2013   TRIG 205* 10/15/2013   CHOLHDL 5.4 10/15/2013    F.  Lifestyle Changes:  Making positive lifestyle changes and Not smoking:  Quit still dips snuff  G.  Symptoms noted with exercise:  Asymptomatic  Report Completed By:  Oletta Lamas. Keshanna Riso RN   Comments:  THis is patients 1st week report. He has done very well his first week. HE achieved apeak METS of 3.96. His resting HR was 64 and resting BP 120/68, his peak HR was 89 and peak BP is 132/68.  A halfway report will follow upon his 18th visit.

## 2013-10-19 NOTE — Addendum Note (Signed)
Encounter addended by: Norlene Duel, RN on: 10/19/2013  8:24 AM<BR>     Documentation filed: Notes Section

## 2013-10-19 NOTE — Addendum Note (Signed)
Encounter addended by: Norlene Duel, RN on: 10/19/2013  8:25 AM<BR>     Documentation filed: Clinical Notes

## 2013-10-20 ENCOUNTER — Encounter (HOSPITAL_COMMUNITY)
Admission: RE | Admit: 2013-10-20 | Discharge: 2013-10-20 | Disposition: A | Discharge status: Home / Self Care | Payer: BC Managed Care – PPO | Source: Ambulatory Visit | Attending: Cardiovascular Disease | Admitting: Cardiovascular Disease

## 2013-10-20 DIAGNOSIS — Z9861 Coronary angioplasty status: Secondary | ICD-10-CM | POA: Insufficient documentation

## 2013-10-20 DIAGNOSIS — I2510 Atherosclerotic heart disease of native coronary artery without angina pectoris: Secondary | ICD-10-CM | POA: Insufficient documentation

## 2013-10-20 DIAGNOSIS — Z5189 Encounter for other specified aftercare: Secondary | ICD-10-CM | POA: Insufficient documentation

## 2013-10-20 NOTE — Telephone Encounter (Signed)
FMLA information has been processed and scanned into his chart. Per [employer], patient credit card was denied and did not go through.  They were to contact patient for payment directly.

## 2013-10-22 ENCOUNTER — Encounter (HOSPITAL_COMMUNITY): Payer: BC Managed Care – PPO

## 2013-10-25 ENCOUNTER — Encounter (HOSPITAL_COMMUNITY)
Admission: RE | Admit: 2013-10-25 | Discharge: 2013-10-25 | Disposition: A | Discharge status: Home / Self Care | Payer: BC Managed Care – PPO | Source: Ambulatory Visit | Attending: Cardiovascular Disease | Admitting: Cardiovascular Disease

## 2013-10-27 ENCOUNTER — Encounter (HOSPITAL_COMMUNITY)
Admission: RE | Admit: 2013-10-27 | Discharge: 2013-10-27 | Disposition: A | Discharge status: Home / Self Care | Payer: BC Managed Care – PPO | Source: Ambulatory Visit | Attending: Cardiovascular Disease | Admitting: Cardiovascular Disease

## 2013-10-29 ENCOUNTER — Encounter (HOSPITAL_COMMUNITY)
Admission: RE | Admit: 2013-10-29 | Discharge: 2013-10-29 | Disposition: A | Discharge status: Home / Self Care | Payer: BC Managed Care – PPO | Source: Ambulatory Visit | Attending: Cardiovascular Disease | Admitting: Cardiovascular Disease

## 2013-11-01 ENCOUNTER — Encounter (HOSPITAL_COMMUNITY)
Admission: RE | Admit: 2013-11-01 | Discharge: 2013-11-01 | Disposition: A | Discharge status: Home / Self Care | Payer: BC Managed Care – PPO | Source: Ambulatory Visit | Attending: Cardiovascular Disease | Admitting: Cardiovascular Disease

## 2013-11-03 ENCOUNTER — Encounter (HOSPITAL_COMMUNITY)
Admission: RE | Admit: 2013-11-03 | Discharge: 2013-11-03 | Disposition: A | Discharge status: Home / Self Care | Payer: BC Managed Care – PPO | Source: Ambulatory Visit | Attending: Cardiovascular Disease | Admitting: Cardiovascular Disease

## 2013-11-05 ENCOUNTER — Encounter (HOSPITAL_COMMUNITY)
Admission: RE | Admit: 2013-11-05 | Discharge: 2013-11-05 | Disposition: A | Discharge status: Home / Self Care | Payer: BC Managed Care – PPO | Source: Ambulatory Visit | Attending: Cardiovascular Disease | Admitting: Cardiovascular Disease

## 2013-11-08 ENCOUNTER — Encounter (HOSPITAL_COMMUNITY)
Admission: RE | Admit: 2013-11-08 | Discharge: 2013-11-08 | Disposition: A | Discharge status: Home / Self Care | Payer: BC Managed Care – PPO | Source: Ambulatory Visit | Attending: Cardiovascular Disease | Admitting: Cardiovascular Disease

## 2013-11-09 NOTE — Progress Notes (Signed)
Cardiac Rehabilitation Program Outcomes Report   Orientation:  09/02/2013 Halfway report: 10/20/2013 Graduate Date:  tbd Discharge Date:  tbd # of sessions completed: 18th DX: Stent X 2  Cardiologist: Bronson Ing Family MD:  Richardson Landry Wolfgang Phoenix Class Time:  06:45  A.  Exercise Program:  Tolerates exercise @ 3.14 METS for 15 minutes  B.  Mental Health:  Good mental attitude  C.  Education/Instruction/Skills  Accurately checks own pulse.  Rest:  63  Exercise:  104, Knows THR for exercise and Uses Perceived Exertion Scale and/or Dyspnea Scale  Uses Perceived Exertion Scale and/or Dyspnea Scale  D.  Nutrition/Weight Control/Body Composition:  Adherence to prescribed nutrition program: good    E.  Blood Lipids    Lab Results  Component Value Date   CHOL 129 10/15/2013   HDL 24* 10/15/2013   LDLCALC 64 10/15/2013   TRIG 205* 10/15/2013   CHOLHDL 5.4 10/15/2013    F.  Lifestyle Changes:  Making positive lifestyle changes and Continues to smoke(Dip Snuff)  G.  Symptoms noted with exercise:  Asymptomatic  Report Completed By:  Oletta Lamas. Jendaya Gossett RN   Comments:  This is patients halfway report. He has done well while in rehab. He achieved a peak METS of  3.96. His rest HR was 63 and rest BP was 130/60. BS was 175. His peak HR was 104 and peak BP was 138/70. A graduation report will follow upon the patients 76 th visit.

## 2013-11-09 NOTE — Addendum Note (Signed)
Encounter addended by: Norlene Duel, RN on: 11/09/2013  8:04 AM<BR>     Documentation filed: Notes Section

## 2013-11-09 NOTE — Addendum Note (Signed)
Encounter addended by: Norlene Duel, RN on: 11/09/2013  8:05 AM<BR>     Documentation filed: Clinical Notes

## 2013-11-10 ENCOUNTER — Encounter (HOSPITAL_COMMUNITY)
Admission: RE | Admit: 2013-11-10 | Discharge: 2013-11-10 | Disposition: A | Discharge status: Home / Self Care | Payer: BC Managed Care – PPO | Source: Ambulatory Visit | Attending: Cardiovascular Disease | Admitting: Cardiovascular Disease

## 2013-11-12 ENCOUNTER — Encounter (HOSPITAL_COMMUNITY): Payer: BC Managed Care – PPO

## 2013-11-15 ENCOUNTER — Encounter (HOSPITAL_COMMUNITY)
Admission: RE | Admit: 2013-11-15 | Discharge: 2013-11-15 | Disposition: A | Discharge status: Home / Self Care | Payer: BC Managed Care – PPO | Source: Ambulatory Visit | Attending: Cardiovascular Disease | Admitting: Cardiovascular Disease

## 2013-11-17 ENCOUNTER — Encounter (HOSPITAL_COMMUNITY): Payer: BC Managed Care – PPO

## 2013-11-19 ENCOUNTER — Encounter (HOSPITAL_COMMUNITY): Payer: BC Managed Care – PPO

## 2013-11-22 ENCOUNTER — Encounter (HOSPITAL_COMMUNITY)
Admission: RE | Admit: 2013-11-22 | Discharge: 2013-11-22 | Disposition: A | Discharge status: Home / Self Care | Payer: BC Managed Care – PPO | Source: Ambulatory Visit | Attending: Cardiovascular Disease | Admitting: Cardiovascular Disease

## 2013-11-22 DIAGNOSIS — Z5189 Encounter for other specified aftercare: Secondary | ICD-10-CM | POA: Diagnosis not present

## 2013-11-22 DIAGNOSIS — I2510 Atherosclerotic heart disease of native coronary artery without angina pectoris: Secondary | ICD-10-CM | POA: Insufficient documentation

## 2013-11-22 DIAGNOSIS — Z9861 Coronary angioplasty status: Secondary | ICD-10-CM | POA: Insufficient documentation

## 2013-11-24 ENCOUNTER — Encounter (HOSPITAL_COMMUNITY)
Admission: RE | Admit: 2013-11-24 | Discharge: 2013-11-24 | Disposition: A | Discharge status: Home / Self Care | Payer: BC Managed Care – PPO | Source: Ambulatory Visit | Attending: Cardiovascular Disease | Admitting: Cardiovascular Disease

## 2013-11-24 DIAGNOSIS — Z5189 Encounter for other specified aftercare: Secondary | ICD-10-CM | POA: Diagnosis not present

## 2013-11-26 ENCOUNTER — Encounter (HOSPITAL_COMMUNITY)
Admission: RE | Admit: 2013-11-26 | Discharge: 2013-11-26 | Disposition: A | Discharge status: Home / Self Care | Payer: BC Managed Care – PPO | Source: Ambulatory Visit | Attending: Cardiovascular Disease | Admitting: Cardiovascular Disease

## 2013-11-26 DIAGNOSIS — Z5189 Encounter for other specified aftercare: Secondary | ICD-10-CM | POA: Diagnosis not present

## 2013-11-29 ENCOUNTER — Encounter (HOSPITAL_COMMUNITY)
Admission: RE | Admit: 2013-11-29 | Discharge: 2013-11-29 | Disposition: A | Discharge status: Home / Self Care | Payer: BC Managed Care – PPO | Source: Ambulatory Visit | Attending: Cardiovascular Disease | Admitting: Cardiovascular Disease

## 2013-11-29 DIAGNOSIS — Z5189 Encounter for other specified aftercare: Secondary | ICD-10-CM | POA: Diagnosis not present

## 2013-12-01 ENCOUNTER — Encounter (HOSPITAL_COMMUNITY)
Admission: RE | Admit: 2013-12-01 | Discharge: 2013-12-01 | Disposition: A | Discharge status: Home / Self Care | Payer: BC Managed Care – PPO | Source: Ambulatory Visit | Attending: Cardiovascular Disease | Admitting: Cardiovascular Disease

## 2013-12-01 DIAGNOSIS — Z5189 Encounter for other specified aftercare: Secondary | ICD-10-CM | POA: Diagnosis not present

## 2013-12-03 ENCOUNTER — Encounter (HOSPITAL_COMMUNITY)
Admission: RE | Admit: 2013-12-03 | Discharge: 2013-12-03 | Disposition: A | Discharge status: Home / Self Care | Payer: BC Managed Care – PPO | Source: Ambulatory Visit | Attending: Cardiovascular Disease | Admitting: Cardiovascular Disease

## 2013-12-03 DIAGNOSIS — Z5189 Encounter for other specified aftercare: Secondary | ICD-10-CM | POA: Diagnosis not present

## 2013-12-06 ENCOUNTER — Encounter (HOSPITAL_COMMUNITY)
Admission: RE | Admit: 2013-12-06 | Discharge: 2013-12-06 | Disposition: A | Discharge status: Home / Self Care | Payer: BC Managed Care – PPO | Source: Ambulatory Visit | Attending: Cardiovascular Disease | Admitting: Cardiovascular Disease

## 2013-12-06 DIAGNOSIS — Z5189 Encounter for other specified aftercare: Secondary | ICD-10-CM | POA: Diagnosis not present

## 2013-12-08 ENCOUNTER — Encounter (HOSPITAL_COMMUNITY)
Admission: RE | Admit: 2013-12-08 | Discharge: 2013-12-08 | Disposition: A | Discharge status: Home / Self Care | Payer: BC Managed Care – PPO | Source: Ambulatory Visit | Attending: Cardiovascular Disease | Admitting: Cardiovascular Disease

## 2013-12-08 DIAGNOSIS — Z5189 Encounter for other specified aftercare: Secondary | ICD-10-CM | POA: Diagnosis not present

## 2013-12-10 ENCOUNTER — Encounter (HOSPITAL_COMMUNITY)
Admission: RE | Admit: 2013-12-10 | Discharge: 2013-12-10 | Disposition: A | Discharge status: Home / Self Care | Payer: BC Managed Care – PPO | Source: Ambulatory Visit | Attending: Cardiovascular Disease | Admitting: Cardiovascular Disease

## 2013-12-10 DIAGNOSIS — Z5189 Encounter for other specified aftercare: Secondary | ICD-10-CM | POA: Diagnosis not present

## 2013-12-10 NOTE — Progress Notes (Signed)
Patient graduated from [facility] today on 12/10/13 after completing 36 sessions. He achieved LTG of 30 minutes of aerobic exercise at Max Met level of 4.4. All patients vitals are WNL. Patient has met with dietician. Discharge instruction has been reviewed in detail and patient stated an understanding of material given. Patient plans to exercise at home on his Treadmill. Cardiac Rehab staff will make f/u calls at 1 month, 6 months, and 1 year. Patient had no complaints of any abnormal S/S or pain on their exit visit. Patient was able to complete 6 minute exit walk test.

## 2013-12-13 ENCOUNTER — Encounter (HOSPITAL_COMMUNITY): Payer: BC Managed Care – PPO

## 2013-12-23 ENCOUNTER — Encounter: Payer: Self-pay | Admitting: Cardiovascular Disease

## 2013-12-23 ENCOUNTER — Ambulatory Visit (INDEPENDENT_AMBULATORY_CARE_PROVIDER_SITE_OTHER): Payer: BC Managed Care – PPO | Admitting: Cardiovascular Disease

## 2013-12-23 ENCOUNTER — Encounter: Payer: Self-pay | Admitting: *Deleted

## 2013-12-23 VITALS — BP 146/84 | HR 76 | Ht 74.0 in | Wt 337.0 lb

## 2013-12-23 DIAGNOSIS — Z7182 Exercise counseling: Secondary | ICD-10-CM

## 2013-12-23 DIAGNOSIS — E785 Hyperlipidemia, unspecified: Secondary | ICD-10-CM

## 2013-12-23 DIAGNOSIS — Z955 Presence of coronary angioplasty implant and graft: Secondary | ICD-10-CM

## 2013-12-23 DIAGNOSIS — Z9861 Coronary angioplasty status: Secondary | ICD-10-CM

## 2013-12-23 DIAGNOSIS — I2510 Atherosclerotic heart disease of native coronary artery without angina pectoris: Secondary | ICD-10-CM

## 2013-12-23 DIAGNOSIS — Z713 Dietary counseling and surveillance: Secondary | ICD-10-CM

## 2013-12-23 DIAGNOSIS — I10 Essential (primary) hypertension: Secondary | ICD-10-CM

## 2013-12-23 MED ORDER — ENALAPRIL MALEATE 10 MG PO TABS: 10.0000 mg | ORAL_TABLET | Freq: Two times a day (BID) | ORAL | Status: DC

## 2013-12-23 NOTE — Progress Notes (Signed)
Patient ID: Steven Crane, male   DOB: [birth year], 53 y.o.   MRN: [id number]      SUBJECTIVE: The patient presents for routine cardiovascular followup. He underwent drug-eluting stent placement to the proximal first obtuse marginal branch as well as the distal RCA. He graduated from cardiac rehabilitation on August 21. He has been feeling well and denies chest pain and shortness of breath, as well as palpitations and leg swelling. He would like to return to work.  Review of Systems: As per "subjective", otherwise negative.  Allergies  Allergen Reactions  . Adhesive [Tape] Rash    Current Outpatient Prescriptions  Medication Sig Dispense Refill  . acetaminophen (TYLENOL) 500 MG tablet Take 1,000 mg by mouth 2 (two) times daily.      Marland Kitchen ALPRAZolam (XANAX) 0.5 MG tablet Take 0.5-1 tablets (0.25-0.5 mg total) by mouth daily as needed for anxiety (*Normally takes at bedtime but will take during the day as needed).  30 tablet  5  . aspirin EC 81 MG tablet Take 81 mg by mouth every evening.      . clopidogrel (PLAVIX) 75 MG tablet Take 1 tablet (75 mg total) by mouth daily with breakfast.  30 tablet  11  . enalapril (VASOTEC) 10 MG tablet TAKE ONE TABLET BY MOUTH ONCE DAILY  30 tablet  5  . fenofibrate 160 MG tablet TAKE ONE TABLET BY MOUTH ONCE DAILY  30 tablet  5  . glyBURIDE-metformin (GLUCOVANCE) 5-500 MG per tablet TAKE TWO TABLETS BY MOUTH TWICE DAILY  120 tablet  5  . insulin glargine (LANTUS) 100 UNIT/ML injection Inject 0.22 mLs (22 Units total) into the skin at bedtime.  10 mL  5  . metoprolol succinate (TOPROL-XL) 50 MG 24 hr tablet Take 1 tablet (50 mg total) by mouth daily.  30 tablet  6  . nitroGLYCERIN (NITROSTAT) 0.4 MG SL tablet Place 1 tablet (0.4 mg total) under the tongue every 5 (five) minutes as needed for chest pain. If no relief call 911.  60 tablet  12  . omeprazole (PRILOSEC OTC) 20 MG tablet Take two tablets daily  60 tablet  11  . pravastatin (PRAVACHOL) 40 MG tablet  TAKE ONE TABLET BY MOUTH ONCE DAILY  30 tablet  5   No current facility-administered medications for this visit.    Past Medical History  Diagnosis Date  . Coronary artery disease May 2015    s/p successful PTCA/DES x 1 to distal RCA and PTCA/DES x 1 to OM-20 Aug 2013  . Closed head injury 1975  . Hypertension   . Hyperlipemia   . GERD (gastroesophageal reflux disease)   . Morbid obesity   . Hypertriglyceridemia   . Psoriasis   . DVT (deep venous thrombosis) 2008    "LLE"  . Myocardial infarction 02/2010  . CHF (congestive heart failure) 02/2010  . Asthma   . Chronic bronchitis     "get it q year"  . Type II diabetes mellitus   . Headache(784.0)     "comes and goes; sometimes qd; sometimes weekly" (08/27/2013)  . Arthritis     "hands, back, knees" (08/27/2013)  . Chronic lower back pain   . LV dysfunction     LVEF 40% at time of cardiac cath May 2015    Past Surgical History  Procedure Laterality Date  . Knee arthroscopy Left 1981  . Colonoscopy  01/06/2012    Procedure: COLONOSCOPY;  Surgeon: [name], MD;  Location: [facility];  Service: Endoscopy;  Laterality: N/A;  7:30 AM  . Coronary angioplasty with stent placement  02/2010; 08/27/2013    "1 + 3"     History   Social History  . Marital Status: Single    Spouse Name: N/A    Number of Children: N/A  . Years of Education: N/A   Occupational History  . Not on file.   Social History Main Topics  . Smoking status: Never Smoker   . Smokeless tobacco: Current User    Types: Snuff, Chew     Comment: "started dipping @ age 52; quit for 5 1/2 yrs at one time; hadn't chewed in awhile"  . Alcohol Use: Yes     Comment: 08/27/2013 "drink beer and liquor; don't drink much"  . Drug Use: No  . Sexual Activity: Yes   Other Topics Concern  . Not on file   Social History Narrative  . No narrative on file     Filed Vitals:   12/23/13 1523  BP: 146/84  Pulse: 76  Height: 6\' 2"  (1.88 m)  Weight: 337 lb (152.862  kg)    PHYSICAL EXAM General: NAD, morbidly obese  Neck: No JVD, no thyromegaly.  Lungs: Clear to auscultation bilaterally with normal respiratory effort.  CV: Nondisplaced PMI. Regular rate and rhythm, normal S1/S2, no S3/S4, no murmur. No pretibial or periankle edema. No carotid bruit. Normal pedal pulses.  Abdomen: Soft, obese, no distention.  Neurologic: Alert and oriented x 3.  Psych: Normal affect.  Extremities: No clubbing or cyanosis.  Skin: Normal. Musculoskeletal: Grossly normal.  ECG: Most recent ECG reviewed.      ASSESSMENT AND PLAN: 1. CAD s/p DES x 2 to proximal OM1 and distal RCA: Stable ischemic heart disease. Cardiac rehabilitation completed. Continue aspirin, Plavix, metoprolol, and pravastatin. I will provide a letter allowing him to return to work. 2. Essential HTN: Mildly elevated today. I will increase the dose of enalapril to 10 mg bid. 3. Hyperlipidemia: Continue pravastatin 40 mg daily. 4. Morbid obesity: Weight loss counseling provided.  Dispo: f/u 6 months.   Kate Sable, M.D., F.A.C.C.

## 2013-12-23 NOTE — Patient Instructions (Addendum)
   Increase Enalapril to 10mg  twice a day. Sent to pharmacy. Continue all other medications.   Return to work note given. Your physician wants you to follow up in: 6 months.  You will receive a reminder letter in the mail one-two months in advance.  If you don't receive a letter, please call our office to schedule the follow up appointment

## 2014-01-10 ENCOUNTER — Telehealth: Payer: Self-pay | Admitting: Family Medicine

## 2014-01-10 DIAGNOSIS — Z125 Encounter for screening for malignant neoplasm of prostate: Secondary | ICD-10-CM

## 2014-01-10 DIAGNOSIS — E785 Hyperlipidemia, unspecified: Secondary | ICD-10-CM

## 2014-01-10 DIAGNOSIS — E119 Type 2 diabetes mellitus without complications: Secondary | ICD-10-CM

## 2014-01-10 DIAGNOSIS — Z79899 Other long term (current) drug therapy: Secondary | ICD-10-CM

## 2014-01-10 NOTE — Telephone Encounter (Signed)
Patients accidental insurance, Steven Crane, will not pay for his work that he missed while he was out for his procedure for stint insertion because of 2 blockages in his arteries.  They need a letter stating that his issue was not a pre-existing issue so that they will cover it.

## 2014-01-12 NOTE — Telephone Encounter (Signed)
[facility] 01/12/14

## 2014-01-12 NOTE — Telephone Encounter (Signed)
Discussed with patient that you could not do letter because he does have hx of CAD.

## 2014-01-12 NOTE — Telephone Encounter (Signed)
Lip liv m7 Aic psa

## 2014-01-12 NOTE — Telephone Encounter (Signed)
Let pt know i am sympathetic, but he did have a prior heart attack and a stent placed in 2011. How does he want me to write that letter since he clearly has a hx of known CAD?

## 2014-01-12 NOTE — Telephone Encounter (Signed)
Does pt need bloodwork  6/26 A1C, lipid, liver, microalb urine

## 2014-01-13 NOTE — Telephone Encounter (Signed)
bloodwork order ready.pt notified.

## 2014-01-15 LAB — HEPATIC FUNCTION PANEL
ALK PHOS: 38 U/L — AB (ref 39–117)
ALT: 47 U/L (ref 0–53)
AST: 27 U/L (ref 0–37)
Albumin: 3.6 g/dL (ref 3.5–5.2)
BILIRUBIN INDIRECT: 0.3 mg/dL (ref 0.2–1.2)
Bilirubin, Direct: 0.1 mg/dL (ref 0.0–0.3)
TOTAL PROTEIN: 6.4 g/dL (ref 6.0–8.3)
Total Bilirubin: 0.4 mg/dL (ref 0.2–1.2)

## 2014-01-15 LAB — LIPID PANEL
Cholesterol: 119 mg/dL (ref 0–200)
HDL: 26 mg/dL — AB (ref >39)
LDL Cholesterol: 63 mg/dL (ref 0–99)
TRIGLYCERIDES: 151 mg/dL — AB (ref <150)
Total CHOL/HDL Ratio: 4.6 Ratio
VLDL: 30 mg/dL (ref 0–40)

## 2014-01-15 LAB — BASIC METABOLIC PANEL
BUN: 13 mg/dL (ref 6–23)
CALCIUM: 8.8 mg/dL (ref 8.4–10.5)
CO2: 25 mEq/L (ref 19–32)
CREATININE: 0.72 mg/dL (ref 0.50–1.35)
Chloride: 102 mEq/L (ref 96–112)
GLUCOSE: 164 mg/dL — AB (ref 70–99)
Potassium: 4.4 mEq/L (ref 3.5–5.3)
Sodium: 134 mEq/L — ABNORMAL LOW (ref 135–145)

## 2014-01-16 LAB — HEMOGLOBIN A1C
Hgb A1c MFr Bld: 8.6 % — ABNORMAL HIGH (ref <5.7)
Mean Plasma Glucose: 200 mg/dL — ABNORMAL HIGH (ref <117)

## 2014-01-17 LAB — PSA: PSA: 0.24 ng/mL (ref <=4.00)

## 2014-01-21 ENCOUNTER — Encounter: Payer: Self-pay | Admitting: Family Medicine

## 2014-01-21 ENCOUNTER — Ambulatory Visit (INDEPENDENT_AMBULATORY_CARE_PROVIDER_SITE_OTHER): Payer: BC Managed Care – PPO | Admitting: Family Medicine

## 2014-01-21 ENCOUNTER — Telehealth: Payer: Self-pay | Admitting: Family Medicine

## 2014-01-21 VITALS — BP 130/82 | Ht 74.0 in | Wt 338.0 lb

## 2014-01-21 DIAGNOSIS — I10 Essential (primary) hypertension: Secondary | ICD-10-CM

## 2014-01-21 DIAGNOSIS — E119 Type 2 diabetes mellitus without complications: Secondary | ICD-10-CM

## 2014-01-21 NOTE — Patient Instructions (Signed)
Increase lantus to thirty units please

## 2014-01-21 NOTE — Progress Notes (Signed)
   Subjective:    Patient ID: Steven Crane, male    DOB: 1960-10-18, 53 y.o.   MRN: [id number]  Diabetes He presents for his follow-up diabetic visit. He has type 2 diabetes mellitus. Risk factors for coronary artery disease include dyslipidemia, hypertension, obesity and diabetes mellitus. Current diabetic treatment includes insulin injections, oral agent (monotherapy) and diet. He is compliant with treatment all of the time. He is following a diabetic diet. He has not had a previous visit with a dietician. He does not see a podiatrist.Eye exam is not current.   Results for orders placed in visit on 01/10/14  LIPID PANEL      Result Value Ref Range   Cholesterol 119  0 - 200 mg/dL   Triglycerides 151 (*) <150 mg/dL   HDL 26 (*) >39 mg/dL   Total CHOL/HDL Ratio 4.6     VLDL 30  0 - 40 mg/dL   LDL Cholesterol 63  0 - 99 mg/dL  HEPATIC FUNCTION PANEL      Result Value Ref Range   Total Bilirubin 0.4  0.2 - 1.2 mg/dL   Bilirubin, Direct 0.1  0.0 - 0.3 mg/dL   Indirect Bilirubin 0.3  0.2 - 1.2 mg/dL   Alkaline Phosphatase 38 (*) 39 - 117 U/L   AST 27  0 - 37 U/L   ALT 47  0 - 53 U/L   Total Protein 6.4  6.0 - 8.3 g/dL   Albumin 3.6  3.5 - 5.2 g/dL  HEMOGLOBIN A1C      Result Value Ref Range   Hemoglobin A1C 8.6 (*) <5.7 %   Mean Plasma Glucose 200 (*) <117 mg/dL  BASIC METABOLIC PANEL      Result Value Ref Range   Sodium 134 (*) 135 - 145 mEq/L   Potassium 4.4  3.5 - 5.3 mEq/L   Chloride 102  96 - 112 mEq/L   CO2 25  19 - 32 mEq/L   Glucose, Bld 164 (*) 70 - 99 mg/dL   BUN 13  6 - 23 mg/dL   Creat 0.72  0.50 - 1.35 mg/dL   Calcium 8.8  8.4 - 10.5 mg/dL  PSA      Result Value Ref Range   PSA 0.24  <=4.00 ng/mL   Patient has history of coronary artery disease. No current symptoms. Had further stenting earlier this year.  Patient on medication for hyperlipidemia. Compliant. No obvious side effects.  On medication for high blood pressure. Has cut down salt intake. Exercising  some   Review of Systems No headache no chest pain no back pain or change in bowel habits blood in stool ROS otherwise negative    Objective:   Physical Exam Work vitals stable no acute distress HEENT normal. Lungs clear. Heart regular rate and rhythm. Ankles without edema.       Assessment & Plan:  Impression 1 type 2 diabetes suboptimally discuss insulin and just #2 coronary artery disease clinically silent #3 hyperlipidemia good control #4 hypertension good control plan diet exercise discussed recheck in several months. Will write a letter for patient regarding chronic conditions. WS

## 2014-01-21 NOTE — Telephone Encounter (Signed)
Does patient needs blood work for next appointment in 3 months?

## 2014-01-23 NOTE — Telephone Encounter (Signed)
no

## 2014-01-24 NOTE — Telephone Encounter (Signed)
Patient notified. Patient verbalized understanding.

## 2014-01-28 ENCOUNTER — Telehealth: Payer: Self-pay | Admitting: Family Medicine

## 2014-01-28 NOTE — Telephone Encounter (Signed)
Patient called wanting to know if records and letter were faxed to [facility] yet. He was seen 10/2. I explained records are ready just waiting on letter.

## 2014-01-31 NOTE — Telephone Encounter (Signed)
Pt.notified

## 2014-01-31 NOTE — Telephone Encounter (Signed)
Notify pt will be completed and faxed tomorrow

## 2014-03-31 ENCOUNTER — Encounter (HOSPITAL_COMMUNITY): Payer: Self-pay | Admitting: Cardiovascular Disease

## 2014-04-11 ENCOUNTER — Other Ambulatory Visit: Payer: Self-pay | Admitting: Cardiovascular Disease

## 2014-04-11 MED ORDER — METOPROLOL SUCCINATE ER 50 MG PO TB24: 50.0000 mg | ORAL_TABLET | Freq: Every day | ORAL | Status: DC

## 2014-04-11 NOTE — Telephone Encounter (Signed)
Received fax refill request  Rx # [id number] Medication:  Metoprolol ER 50 mg tab Qty 30 Sig:  Take one tablet by mouth once daily, dose increased 08/25/13 Physician:  Bronson Ing

## 2014-04-19 ENCOUNTER — Other Ambulatory Visit: Payer: Self-pay | Admitting: Family Medicine

## 2014-04-21 ENCOUNTER — Ambulatory Visit (INDEPENDENT_AMBULATORY_CARE_PROVIDER_SITE_OTHER): Payer: BC Managed Care – PPO | Admitting: Family Medicine

## 2014-04-21 ENCOUNTER — Encounter: Payer: Self-pay | Admitting: Family Medicine

## 2014-04-21 VITALS — BP 146/90 | Ht 74.0 in | Wt 333.0 lb

## 2014-04-21 DIAGNOSIS — I10 Essential (primary) hypertension: Secondary | ICD-10-CM

## 2014-04-21 DIAGNOSIS — I2510 Atherosclerotic heart disease of native coronary artery without angina pectoris: Secondary | ICD-10-CM

## 2014-04-21 DIAGNOSIS — Z9861 Coronary angioplasty status: Secondary | ICD-10-CM

## 2014-04-21 DIAGNOSIS — E119 Type 2 diabetes mellitus without complications: Secondary | ICD-10-CM

## 2014-04-21 LAB — POCT GLYCOSYLATED HEMOGLOBIN (HGB A1C): Hemoglobin A1C: 8.4

## 2014-04-21 MED ORDER — INSULIN GLARGINE 100 UNIT/ML ~~LOC~~ SOLN: 34.0000 [IU] | Freq: Every day | SUBCUTANEOUS | Status: DC

## 2014-04-21 MED ORDER — ENALAPRIL MALEATE 20 MG PO TABS: 20.0000 mg | ORAL_TABLET | Freq: Two times a day (BID) | ORAL | Status: DC

## 2014-04-21 NOTE — Progress Notes (Signed)
   Subjective:    Patient ID: Steven Crane, male    DOB: 07/17/1960, 53 y.o.   MRN: [id number]  Diabetes He presents for his follow-up diabetic visit. He has type 2 diabetes mellitus. Hypoglycemia symptoms include nervousness/anxiousness. There are no diabetic associated symptoms. Current diabetic treatment includes insulin injections and oral agent (monotherapy). He is compliant with treatment most of the time. He never participates in exercise. Blood glucose monitoring compliance is fair. His highest blood glucose is >200 mg/dl. His overall blood glucose range is 130-140 mg/dl. He does not see a podiatrist.Eye exam is not current.   Needs a refill on xanax. Generally takes for insomnia.  Compliant with blood pressure medication. No obvious side effects. Has cut down salt intake.  No chest pain. No headache.  Ongoing stress with girlfriend had a significant chronic illness status post stroke.   Review of Systems  Psychiatric/Behavioral: The patient is nervous/anxious.    no chest pain no headache no back pain abdominal pain no change in bowel habits blood in stool     Objective:   Physical Exam Alert vitals stable. HEENT normal. Morbid obesity present. Lungs clear. Heart regular in rhythm. Ankles without edema. Foot sensory exam within normal limits pulses sensation intact.       Assessment & Plan:  Impression 1 type 2 diabetes suboptimum control. A1c slightly improved but still too elevated. #2 hypertension decent control. #3 insomnia discussed. #4 coronary artery disease ongoing. Plan diet discussed. Exercise discussed. Recheck in several months. Increase insulin. 34 units daily. Rationale discussed. WSL

## 2014-04-24 ENCOUNTER — Other Ambulatory Visit: Payer: Self-pay | Admitting: Family Medicine

## 2014-05-02 ENCOUNTER — Ambulatory Visit (INDEPENDENT_AMBULATORY_CARE_PROVIDER_SITE_OTHER): Payer: BC Managed Care – PPO | Admitting: Family Medicine

## 2014-05-02 ENCOUNTER — Ambulatory Visit: Payer: BC Managed Care – PPO | Admitting: Family Medicine

## 2014-05-02 ENCOUNTER — Encounter: Payer: Self-pay | Admitting: Family Medicine

## 2014-05-02 VITALS — BP 138/76 | Temp 99.1°F | Ht 74.0 in | Wt 336.0 lb

## 2014-05-02 DIAGNOSIS — J329 Chronic sinusitis, unspecified: Secondary | ICD-10-CM

## 2014-05-02 DIAGNOSIS — J209 Acute bronchitis, unspecified: Secondary | ICD-10-CM

## 2014-05-02 MED ORDER — LEVOFLOXACIN 500 MG PO TABS: 500.0000 mg | ORAL_TABLET | Freq: Every day | ORAL | Status: AC

## 2014-05-02 MED ORDER — ALPRAZOLAM 0.5 MG PO TABS: 0.2500 mg | ORAL_TABLET | Freq: Every day | ORAL | Status: DC | PRN

## 2014-05-02 MED ORDER — ALBUTEROL SULFATE HFA 108 (90 BASE) MCG/ACT IN AERS: 2.0000 | INHALATION_SPRAY | Freq: Four times a day (QID) | RESPIRATORY_TRACT | Status: DC | PRN

## 2014-05-02 NOTE — Progress Notes (Signed)
   Subjective:    Patient ID: Steven Crane, male    DOB: March 24, 1961, 54 y.o.   MRN: [id number]  Cough This is a new problem. The current episode started in the past 7 days. Associated symptoms include a fever and a sore throat. Associated symptoms comments: SOB, back pain on left side. Treatments tried: tylenol, cough med.   Requesting refill on xanax.   Occasional wheezing with cough productive at times. Diminished energy. Review of Systems  Constitutional: Positive for fever.  HENT: Positive for sore throat.   Respiratory: Positive for cough.        Objective:   Physical Exam Alert moderate malaise. HEENT moderate nasal congestion pharynx normal lung no crackles positive bronchial cough slight wheeze with cough       Assessment & Plan:  Impression rhinosinusitis/bronchitis with reactive airways plan Levaquin daily 10 days. Albuterol when necessary. Xanax refilled at request. [facility]

## 2014-05-19 ENCOUNTER — Other Ambulatory Visit: Payer: Self-pay | Admitting: Family Medicine

## 2014-05-31 ENCOUNTER — Other Ambulatory Visit: Payer: Self-pay | Admitting: Family Medicine

## 2014-06-18 ENCOUNTER — Other Ambulatory Visit: Payer: Self-pay | Admitting: Family Medicine

## 2014-07-02 ENCOUNTER — Other Ambulatory Visit: Payer: Self-pay | Admitting: Family Medicine

## 2014-07-15 ENCOUNTER — Telehealth: Payer: Self-pay | Admitting: Family Medicine

## 2014-07-15 DIAGNOSIS — Z79899 Other long term (current) drug therapy: Secondary | ICD-10-CM

## 2014-07-15 DIAGNOSIS — E785 Hyperlipidemia, unspecified: Secondary | ICD-10-CM

## 2014-07-15 DIAGNOSIS — E119 Type 2 diabetes mellitus without complications: Secondary | ICD-10-CM

## 2014-07-15 DIAGNOSIS — I10 Essential (primary) hypertension: Secondary | ICD-10-CM

## 2014-07-15 NOTE — Telephone Encounter (Signed)
Pt is requesting lab orders to be sent over. Pt is hoping to get these done  Tomorrow morning, last labs were Lipid,hepatic,hemoglobin,bmp,psa on 01/13/14

## 2014-07-15 NOTE — Telephone Encounter (Signed)
BW orders in. Patient notified.

## 2014-07-15 NOTE — Telephone Encounter (Signed)
Lipid/liver/A1C/met 7

## 2014-07-18 LAB — LIPID PANEL
Chol/HDL Ratio: 4.7 ratio units (ref 0.0–5.0)
Cholesterol, Total: 135 mg/dL (ref 100–199)
HDL: 29 mg/dL — ABNORMAL LOW (ref >39)
LDL Calculated: 76 mg/dL (ref 0–99)
Triglycerides: 150 mg/dL — ABNORMAL HIGH (ref 0–149)
VLDL Cholesterol Cal: 30 mg/dL (ref 5–40)

## 2014-07-18 LAB — HEMOGLOBIN A1C
Est. average glucose Bld gHb Est-mCnc: 212 mg/dL
Hgb A1c MFr Bld: 9 % — ABNORMAL HIGH (ref 4.8–5.6)

## 2014-07-18 LAB — HEPATIC FUNCTION PANEL
ALT: 37 IU/L (ref 0–44)
AST: 20 IU/L (ref 0–40)
Albumin: 3.7 g/dL (ref 3.5–5.5)
Alkaline Phosphatase: 53 IU/L (ref 39–117)
Bilirubin Total: 0.2 mg/dL (ref 0.0–1.2)
Bilirubin, Direct: 0.1 mg/dL (ref 0.00–0.40)
Total Protein: 6.1 g/dL (ref 6.0–8.5)

## 2014-07-18 LAB — BASIC METABOLIC PANEL
BUN / CREAT RATIO: 19 (ref 9–20)
BUN: 13 mg/dL (ref 6–24)
CO2: 22 mmol/L (ref 18–29)
Calcium: 9.1 mg/dL (ref 8.7–10.2)
Chloride: 99 mmol/L (ref 97–108)
Creatinine, Ser: 0.68 mg/dL — ABNORMAL LOW (ref 0.76–1.27)
GFR calc Af Amer: 126 mL/min/{1.73_m2} (ref >59)
GFR, EST NON AFRICAN AMERICAN: 109 mL/min/{1.73_m2} (ref >59)
Glucose: 196 mg/dL — ABNORMAL HIGH (ref 65–99)
Potassium: 5 mmol/L (ref 3.5–5.2)
Sodium: 136 mmol/L (ref 134–144)

## 2014-07-22 ENCOUNTER — Ambulatory Visit: Payer: BC Managed Care – PPO | Admitting: Family Medicine

## 2014-07-22 ENCOUNTER — Ambulatory Visit (INDEPENDENT_AMBULATORY_CARE_PROVIDER_SITE_OTHER): Payer: BC Managed Care – PPO | Admitting: Family Medicine

## 2014-07-22 ENCOUNTER — Encounter: Payer: Self-pay | Admitting: Family Medicine

## 2014-07-22 VITALS — BP 122/72 | Ht 74.0 in | Wt 331.0 lb

## 2014-07-22 DIAGNOSIS — E785 Hyperlipidemia, unspecified: Secondary | ICD-10-CM

## 2014-07-22 DIAGNOSIS — E119 Type 2 diabetes mellitus without complications: Secondary | ICD-10-CM | POA: Diagnosis not present

## 2014-07-22 DIAGNOSIS — I10 Essential (primary) hypertension: Secondary | ICD-10-CM

## 2014-07-22 MED ORDER — GLYBURIDE-METFORMIN 5-500 MG PO TABS: 2.0000 | ORAL_TABLET | Freq: Two times a day (BID) | ORAL | Status: DC

## 2014-07-22 NOTE — Progress Notes (Signed)
   Subjective:    Patient ID: Steven Crane, male    DOB: 1961-01-19, 54 y.o.   MRN: [id number]  Diabetes He presents for his follow-up diabetic visit. He has type 2 diabetes mellitus. Risk factors for coronary artery disease include diabetes mellitus, dyslipidemia, hypertension, obesity and sedentary lifestyle. Current diabetic treatment includes insulin injections and oral agent (monotherapy). He is following a diabetic diet. He has not had a previous visit with a dietitian. He rarely participates in exercise. He does not see a podiatrist.Eye exam is not current.   Discuss lab results-HgbA1c 9 on labs  Patient reports his sugar has been up  Results for orders placed or performed in visit on 07/15/14  Hepatic function panel  Result Value Ref Range   Total Protein 6.1 6.0 - 8.5 g/dL   Albumin 3.7 3.5 - 5.5 g/dL   Bilirubin Total 0.2 0.0 - 1.2 mg/dL   Bilirubin, Direct 0.10 0.00 - 0.40 mg/dL   Alkaline Phosphatase 53 39 - 117 IU/L   AST 20 0 - 40 IU/L   ALT 37 0 - 44 IU/L  Lipid panel  Result Value Ref Range   Cholesterol, Total 135 100 - 199 mg/dL   Triglycerides 150 (H) 0 - 149 mg/dL   HDL 29 (L) >39 mg/dL   VLDL Cholesterol Cal 30 5 - 40 mg/dL   LDL Calculated 76 0 - 99 mg/dL   Chol/HDL Ratio 4.7 0.0 - 5.0 ratio units  Hemoglobin A1c  Result Value Ref Range   Hgb A1c MFr Bld 9.0 (H) 4.8 - 5.6 %   Est. average glucose Bld gHb Est-mCnc 212 mg/dL  Basic metabolic panel  Result Value Ref Range   Glucose 196 (H) 65 - 99 mg/dL   BUN 13 6 - 24 mg/dL   Creatinine, Ser 0.68 (L) 0.76 - 1.27 mg/dL   GFR calc non Af Amer 109 >59 mL/min/1.73   GFR calc Af Amer 126 >59 mL/min/1.73   BUN/Creatinine Ratio 19 9 - 20   Sodium 136 134 - 144 mmol/L   Potassium 5.0 3.5 - 5.2 mmol/L   Chloride 99 97 - 108 mmol/L   CO2 22 18 - 29 mmol/L   Calcium 9.1 8.7 - 10.2 mg/dL   No eye doc visit for two or three yrs--encouraged  Patient claims compliance with his blood pressure medicine. No obvious  side effects. Watching salt in his diet. Unfortunately not exercising.   Claims compliance with lipid medicine. No obvious side effects. Does not miss dose. Has cut that down diet.  No chest pain.  Ongoing insomnia at times. Review of Systems No chest pain no back pain no abdominal pain no change in bowel habits no blood in stool ROS otherwise negative    Objective:   Physical Exam Alert vital signs stable blood pressure good on repeat. HEENT normal. Lungs clear. Heart regular in rhythm. Ankles edema. Diabetic foot exam normal       Assessment & Plan:  Impression 1 hyperlipidemia good control #2 hypertension good control #3 type 2 diabetes suboptimum discuss though improving. Has just bumped up Lantus on his own to 40 units plan diet exercise discussed maintain all medications. Warning signs discussed WSL

## 2014-08-10 ENCOUNTER — Other Ambulatory Visit: Payer: Self-pay | Admitting: *Deleted

## 2014-08-10 MED ORDER — CLOPIDOGREL BISULFATE 75 MG PO TABS: 75.0000 mg | ORAL_TABLET | Freq: Every day | ORAL | Status: DC

## 2014-08-17 ENCOUNTER — Other Ambulatory Visit: Payer: Self-pay | Admitting: Family Medicine

## 2014-09-06 ENCOUNTER — Encounter: Payer: Self-pay | Admitting: Cardiovascular Disease

## 2014-09-06 ENCOUNTER — Ambulatory Visit (INDEPENDENT_AMBULATORY_CARE_PROVIDER_SITE_OTHER): Payer: BC Managed Care – PPO | Admitting: Cardiovascular Disease

## 2014-09-06 VITALS — BP 144/82 | HR 85 | Ht 74.0 in | Wt 331.0 lb

## 2014-09-06 DIAGNOSIS — I10 Essential (primary) hypertension: Secondary | ICD-10-CM

## 2014-09-06 DIAGNOSIS — Z713 Dietary counseling and surveillance: Secondary | ICD-10-CM

## 2014-09-06 DIAGNOSIS — Z955 Presence of coronary angioplasty implant and graft: Secondary | ICD-10-CM

## 2014-09-06 DIAGNOSIS — R002 Palpitations: Secondary | ICD-10-CM

## 2014-09-06 DIAGNOSIS — I2510 Atherosclerotic heart disease of native coronary artery without angina pectoris: Secondary | ICD-10-CM

## 2014-09-06 NOTE — Progress Notes (Signed)
Patient ID: Steven Crane, male   DOB: 1960/08/18, 54 y.o.   MRN: [id number]      SUBJECTIVE: The patient presents for routine cardiovascular followup. He underwent drug-eluting stent placement to the proximal first obtuse marginal branch as well as the distal RCA in 08/2013.  ECG performed in the office today demonstrates normal sinus rhythm with old inferior and anterior infarct.  He denies chest pain and shortness of breath. He quit drinking soft drink and tea for the past 2 weeks and he has noticed that his palpitations have resolved. He is trying to lower his blood sugars. He used to be walking 2 miles a day but has not done so due to his work schedule and his girlfriend who has had a stroke.   Review of Systems: As per "subjective", otherwise negative.  Allergies  Allergen Reactions  . Adhesive [Tape] Rash    Current Outpatient Prescriptions  Medication Sig Dispense Refill  . acetaminophen (TYLENOL) 500 MG tablet Take 1,000 mg by mouth 2 (two) times daily.    Marland Kitchen albuterol (PROVENTIL HFA;VENTOLIN HFA) 108 (90 BASE) MCG/ACT inhaler Inhale 2 puffs into the lungs every 6 (six) hours as needed for wheezing or shortness of breath. 1 Inhaler 2  . ALPRAZolam (XANAX) 0.5 MG tablet Take 0.5-1 tablets (0.25-0.5 mg total) by mouth daily as needed for anxiety (*Normally takes at bedtime but will take during the day as needed). 30 tablet 5  . aspirin EC 81 MG tablet Take 81 mg by mouth every evening.    . clopidogrel (PLAVIX) 75 MG tablet Take 1 tablet (75 mg total) by mouth daily with breakfast. 30 tablet 5  . enalapril (VASOTEC) 20 MG tablet Take 1 tablet (20 mg total) by mouth 2 (two) times daily. 60 tablet 5  . fenofibrate 160 MG tablet TAKE ONE TABLET BY MOUTH ONCE DAILY 30 tablet 5  . glyBURIDE-metformin (GLUCOVANCE) 5-500 MG per tablet Take 2 tablets by mouth 2 (two) times daily. 120 tablet 2  . insulin glargine (LANTUS) 100 UNIT/ML injection Inject 0.34 mLs (34 Units total) into the skin at  bedtime. (Patient taking differently: Inject 40 Units into the skin at bedtime. ) 10 mL 5  . metoprolol succinate (TOPROL-XL) 50 MG 24 hr tablet Take 1 tablet (50 mg total) by mouth daily. 30 tablet 6  . nitroGLYCERIN (NITROSTAT) 0.4 MG SL tablet Place 1 tablet (0.4 mg total) under the tongue every 5 (five) minutes as needed for chest pain. If no relief call 911. 60 tablet 12  . pravastatin (PRAVACHOL) 40 MG tablet TAKE ONE TABLET BY MOUTH ONCE DAILY 30 tablet 5  . PRILOSEC OTC 20 MG tablet TAKE TWO TABLETS BY MOUTH ONCE DAILY 60 tablet 5   No current facility-administered medications for this visit.    Past Medical History  Diagnosis Date  . Coronary artery disease May 2015    s/p successful PTCA/DES x 1 to distal RCA and PTCA/DES x 1 to OM-20 Aug 2013  . Closed head injury 1975  . Hypertension   . Hyperlipemia   . GERD (gastroesophageal reflux disease)   . Morbid obesity   . Hypertriglyceridemia   . Psoriasis   . DVT (deep venous thrombosis) 2008    "LLE"  . Myocardial infarction 02/2010  . CHF (congestive heart failure) 02/2010  . Asthma   . Chronic bronchitis     "get it q year"  . Type II diabetes mellitus   . Headache(784.0)     "comes and  goes; sometimes qd; sometimes weekly" (08/27/2013)  . Arthritis     "hands, back, knees" (08/27/2013)  . Chronic lower back pain   . LV dysfunction     LVEF 40% at time of cardiac cath May 2015    Past Surgical History  Procedure Laterality Date  . Knee arthroscopy Left 1981  . Colonoscopy  01/06/2012    Procedure: COLONOSCOPY;  Surgeon: [name], MD;  Location: [facility];  Service: Endoscopy;  Laterality: N/A;  7:30 AM  . Coronary angioplasty with stent placement  02/2010; 08/27/2013    "1 + 3"   . Left heart catheterization with coronary angiogram N/A 08/27/2013    Procedure: LEFT HEART CATHETERIZATION WITH CORONARY ANGIOGRAM;  Surgeon: [name], MD;  Location: [facility] CATH LAB;  Service: Cardiovascular;  Laterality:  N/A;    History   Social History  . Marital Status: Single    Spouse Name: N/A  . Number of Children: N/A  . Years of Education: N/A   Occupational History  . Not on file.   Social History Main Topics  . Smoking status: Never Smoker   . Smokeless tobacco: Current User    Types: Snuff, Chew     Comment: "started dipping @ age 44; quit for 5 1/2 yrs at one time; hadn't chewed in awhile"  . Alcohol Use: Yes     Comment: 08/27/2013 "drink beer and liquor; don't drink much"  . Drug Use: No  . Sexual Activity: Yes   Other Topics Concern  . Not on file   Social History Narrative     Filed Vitals:   09/06/14 1604  BP: 144/82  Pulse: 85  Height: 6\' 2"  (1.88 m)  Weight: 331 lb (150.141 kg)  SpO2: 96%    PHYSICAL EXAM General: NAD, morbidly obese  Neck: No JVD, no thyromegaly.  Lungs: Clear to auscultation bilaterally with normal respiratory effort.  CV: Nondisplaced PMI. Regular rate and rhythm, normal S1/S2, no S3/S4, no murmur. No pretibial or periankle edema.   Abdomen: Soft, obese, no distention.  Neurologic: Alert and oriented x 3.  Psych: Normal affect.  Extremities: No clubbing or cyanosis.  Skin: Normal. Musculoskeletal: Grossly normal. Extremities: No clubbing or cyanosis.   ECG: Most recent ECG reviewed.      ASSESSMENT AND PLAN: 1. CAD s/p DES x 2 to proximal OM1 and distal RCA in 08/2013: Stable ischemic heart disease. Continue aspirin, Plavix (given multiple stents), metoprolol, and pravastatin.  2. Essential HTN: Mildly elevated on enalapril 20 mg bid. No changes. Encouraged therapeutic lifestyle changes.  3. Hyperlipidemia: Lipids on 07/16/14 showed total cholesterol 135, triglycerides 150, HDL 29, LDL 76. Continue pravastatin 40 mg daily.  4. Morbid obesity: Weight loss counseling provided.  Dispo: f/u 6 months.   Kate Sable, M.D., F.A.C.C.

## 2014-09-06 NOTE — Patient Instructions (Signed)
Continue all current medications. Your physician wants you to follow up in: 9 months.  You will receive a reminder letter in the mail one-two months in advance.  If you don't receive a letter, please call our office to schedule the follow up appointment

## 2014-10-21 ENCOUNTER — Telehealth: Payer: Self-pay | Admitting: Family Medicine

## 2014-10-21 ENCOUNTER — Encounter: Payer: Self-pay | Admitting: Family Medicine

## 2014-10-21 ENCOUNTER — Ambulatory Visit (INDEPENDENT_AMBULATORY_CARE_PROVIDER_SITE_OTHER): Payer: BC Managed Care – PPO | Admitting: Family Medicine

## 2014-10-21 VITALS — BP 130/90 | Ht 74.0 in | Wt 329.4 lb

## 2014-10-21 DIAGNOSIS — E119 Type 2 diabetes mellitus without complications: Secondary | ICD-10-CM

## 2014-10-21 LAB — POCT GLYCOSYLATED HEMOGLOBIN (HGB A1C): HEMOGLOBIN A1C: 7.9

## 2014-10-21 MED ORDER — CLOPIDOGREL BISULFATE 75 MG PO TABS: 75.0000 mg | ORAL_TABLET | Freq: Every day | ORAL | Status: DC

## 2014-10-21 MED ORDER — FENOFIBRATE 160 MG PO TABS: 160.0000 mg | ORAL_TABLET | Freq: Every day | ORAL | Status: DC

## 2014-10-21 MED ORDER — INSULIN GLARGINE 100 UNIT/ML ~~LOC~~ SOLN: 46.0000 [IU] | Freq: Every day | SUBCUTANEOUS | Status: DC

## 2014-10-21 MED ORDER — ALBUTEROL SULFATE HFA 108 (90 BASE) MCG/ACT IN AERS: 2.0000 | INHALATION_SPRAY | Freq: Four times a day (QID) | RESPIRATORY_TRACT | Status: DC | PRN

## 2014-10-21 MED ORDER — NITROGLYCERIN 0.4 MG SL SUBL: 0.4000 mg | SUBLINGUAL_TABLET | SUBLINGUAL | Status: DC | PRN

## 2014-10-21 MED ORDER — PRAVASTATIN SODIUM 40 MG PO TABS: 40.0000 mg | ORAL_TABLET | Freq: Every day | ORAL | Status: DC

## 2014-10-21 MED ORDER — ENALAPRIL MALEATE 20 MG PO TABS: 20.0000 mg | ORAL_TABLET | Freq: Two times a day (BID) | ORAL | Status: DC

## 2014-10-21 MED ORDER — GLYBURIDE-METFORMIN 5-500 MG PO TABS: 2.0000 | ORAL_TABLET | Freq: Two times a day (BID) | ORAL | Status: DC

## 2014-10-21 MED ORDER — OMEPRAZOLE MAGNESIUM 20 MG PO TBEC: 40.0000 mg | DELAYED_RELEASE_TABLET | Freq: Every day | ORAL | Status: DC

## 2014-10-21 MED ORDER — METOPROLOL SUCCINATE ER 50 MG PO TB24: 50.0000 mg | ORAL_TABLET | Freq: Every day | ORAL | Status: DC

## 2014-10-21 NOTE — Addendum Note (Signed)
Addended by: Margaretmary Eddy on: 10/21/2014 08:38 AM   Modules accepted: Level of Service

## 2014-10-21 NOTE — Telephone Encounter (Signed)
Pt needs labs, aware of [facility]  Last labs

## 2014-10-21 NOTE — Patient Instructions (Signed)
Increase lantus to 46 units per day

## 2014-10-21 NOTE — Telephone Encounter (Signed)
Last labs, 07-16-14  Lip, Hep, A1C, BMP  Call pt when sent

## 2014-10-21 NOTE — Addendum Note (Signed)
Addended by: Launa Grill on: 10/21/2014 08:42 AM   Modules accepted: Orders

## 2014-10-21 NOTE — Progress Notes (Signed)
   Subjective:    Patient ID: Steven Crane, male    DOB: 10-Jan-1961, 54 y.o.   MRN: [id number]  Diabetes He presents for his follow-up diabetic visit. He has type 2 diabetes mellitus. No MedicAlert identification noted. Associated symptoms include blurred vision. (Occasional blurred vision) He is compliant with treatment all of the time. He is following a generally unhealthy diet. He has not had a previous visit with a dietitian. His breakfast blood glucose range is generally 110-130 mg/dl. His lunch blood glucose range is generally 130-140 mg/dl. He does not see a podiatrist.Eye exam is not current.   Patient states that he does not see a podiatrist and has not had diabetic eye exam. Patient states no other concerns this visit.  Results for orders placed or performed in visit on 10/21/14  POCT glycosylated hemoglobin (Hb A1C)  Result Value Ref Range   Hemoglobin A1C 7.9    Staying active at work and walking a lot  Does not add a lot of salt, trying to watch what he is eating  Stopped drinking soft drinks  Review of Systems  Eyes: Positive for blurred vision.       Objective:   Physical Exam        Assessment & Plan:

## 2014-10-27 NOTE — Telephone Encounter (Signed)
Checking on these labs

## 2014-10-31 NOTE — Telephone Encounter (Signed)
Pt is not requesting bw for anything specific. He is not having any problems to where he would need any bw and he doesn't remember you telling him he needs any. He states he does bw every 6 months. Discussed with him that last bw was 3/26 and not due til sept 26. Had a1c in July and has upcoming appt in oct.

## 2014-10-31 NOTE — Telephone Encounter (Signed)
Call pt, had sig b w in march, did we tell him at last visit he needed something different?

## 2014-10-31 NOTE — Telephone Encounter (Signed)
Pt notified he should call a couple of weeks before next appt in oct to get orders.

## 2014-11-19 ENCOUNTER — Other Ambulatory Visit: Payer: Self-pay | Admitting: Family Medicine

## 2014-11-21 NOTE — Telephone Encounter (Signed)
Last seen:10/21/14

## 2014-11-21 NOTE — Telephone Encounter (Signed)
Ok plus 5 refp

## 2015-01-10 LAB — HM DIABETES EYE EXAM

## 2015-01-22 ENCOUNTER — Other Ambulatory Visit: Payer: Self-pay | Admitting: Family Medicine

## 2015-01-27 ENCOUNTER — Encounter: Payer: Self-pay | Admitting: Family Medicine

## 2015-01-27 ENCOUNTER — Ambulatory Visit (INDEPENDENT_AMBULATORY_CARE_PROVIDER_SITE_OTHER): Payer: BC Managed Care – PPO | Admitting: Family Medicine

## 2015-01-27 VITALS — BP 132/80 | Ht 74.0 in | Wt 332.0 lb

## 2015-01-27 DIAGNOSIS — Z125 Encounter for screening for malignant neoplasm of prostate: Secondary | ICD-10-CM

## 2015-01-27 DIAGNOSIS — Z79899 Other long term (current) drug therapy: Secondary | ICD-10-CM

## 2015-01-27 DIAGNOSIS — I10 Essential (primary) hypertension: Secondary | ICD-10-CM

## 2015-01-27 DIAGNOSIS — E785 Hyperlipidemia, unspecified: Secondary | ICD-10-CM

## 2015-01-27 DIAGNOSIS — E118 Type 2 diabetes mellitus with unspecified complications: Secondary | ICD-10-CM

## 2015-01-27 DIAGNOSIS — J329 Chronic sinusitis, unspecified: Secondary | ICD-10-CM | POA: Diagnosis not present

## 2015-01-27 LAB — POCT GLYCOSYLATED HEMOGLOBIN (HGB A1C): HEMOGLOBIN A1C: 7.3

## 2015-01-27 MED ORDER — INSULIN GLARGINE 100 UNIT/ML ~~LOC~~ SOLN: 46.0000 [IU] | Freq: Every day | SUBCUTANEOUS | Status: DC

## 2015-01-27 MED ORDER — ENALAPRIL MALEATE 20 MG PO TABS: 20.0000 mg | ORAL_TABLET | Freq: Two times a day (BID) | ORAL | Status: DC

## 2015-01-27 MED ORDER — AMOXICILLIN-POT CLAVULANATE 875-125 MG PO TABS: 1.0000 | ORAL_TABLET | Freq: Two times a day (BID) | ORAL | Status: AC

## 2015-01-27 MED ORDER — GLYBURIDE-METFORMIN 5-500 MG PO TABS: 2.0000 | ORAL_TABLET | Freq: Two times a day (BID) | ORAL | Status: DC

## 2015-01-27 MED ORDER — CLOPIDOGREL BISULFATE 75 MG PO TABS: 75.0000 mg | ORAL_TABLET | Freq: Every day | ORAL | Status: DC

## 2015-01-27 MED ORDER — OMEPRAZOLE MAGNESIUM 20 MG PO TBEC: 40.0000 mg | DELAYED_RELEASE_TABLET | Freq: Every day | ORAL | Status: DC

## 2015-01-27 MED ORDER — PRAVASTATIN SODIUM 40 MG PO TABS: 40.0000 mg | ORAL_TABLET | Freq: Every day | ORAL | Status: DC

## 2015-01-27 MED ORDER — FENOFIBRATE 160 MG PO TABS: 160.0000 mg | ORAL_TABLET | Freq: Every day | ORAL | Status: DC

## 2015-01-27 MED ORDER — METOPROLOL SUCCINATE ER 50 MG PO TB24: 50.0000 mg | ORAL_TABLET | Freq: Every day | ORAL | Status: DC

## 2015-01-27 NOTE — Progress Notes (Signed)
   Subjective:    Patient ID: Steven Crane, male    DOB: 1961-02-09, 54 y.o.   MRN: [id number] Patient presents with several distinct concerns Diabetes He presents for his follow-up diabetic visit. He has type 2 diabetes mellitus. There are no hypoglycemic associated symptoms. There are no diabetic associated symptoms. There are no hypoglycemic complications. There are no diabetic complications. There are no known risk factors for coronary artery disease. Current diabetic treatment includes oral agent (monotherapy). He is compliant with treatment all of the time.   Last diabetic eye exam- 01/10/15  Results for orders placed or performed in visit on 01/27/15  POCT glycosylated hemoglobin (Hb A1C)  Result Value Ref Range   Hemoglobin A1C 7.3   HM DIABETES EYE EXAM  Result Value Ref Range   HM Diabetic Eye Exam No Retinopathy No Retinopathy   htn meds compliant. Medication use reviewed. Blood pressure good when checked elsewhere. Does not miss a dose.  Lipid meds compliant. Compliant with medication. No obvious side effects. No recent blood work. Will need blood testing and potential change in therapy   Patient notes headache frontal in nature nasal discharge diminished energy    Patient has no concerns at this time.    Review of Systems Positive headache positive cough sore throat diminished energy ROS complete otherwise negative    Objective:   Physical Exam  Alert moderate malaise vitals stable frontal maxillary tenderness pharynx normal neck supple lungs clear. Heart rare rhythm. Ankles without edema      Assessment & Plan:  Impression 1 rhinosinusitis #2 type 2 diabetes good control not perfect but good at 7.3 #3 hypertension good control maintain same dose meds #4 hyperlipidemia we'll need to adjust meds if necessary based on bloodwork plan blood work. Other medications refilled. Antibiotics prescribed. Diet exercise discussed. Recheck in as scheduled WSL

## 2015-01-28 LAB — BASIC METABOLIC PANEL
BUN/Creatinine Ratio: 20 (ref 9–20)
BUN: 12 mg/dL (ref 6–24)
CALCIUM: 9 mg/dL (ref 8.7–10.2)
CHLORIDE: 99 mmol/L (ref 97–108)
CO2: 24 mmol/L (ref 18–29)
Creatinine, Ser: 0.59 mg/dL — ABNORMAL LOW (ref 0.76–1.27)
GFR, EST AFRICAN AMERICAN: 133 mL/min/{1.73_m2} (ref >59)
GFR, EST NON AFRICAN AMERICAN: 115 mL/min/{1.73_m2} (ref >59)
Glucose: 195 mg/dL — ABNORMAL HIGH (ref 65–99)
POTASSIUM: 4.9 mmol/L (ref 3.5–5.2)
Sodium: 134 mmol/L (ref 134–144)

## 2015-01-28 LAB — MICROALBUMIN / CREATININE URINE RATIO
CREATININE, UR: 82.3 mg/dL
MICROALB/CREAT RATIO: 4.1 mg/g{creat} (ref 0.0–30.0)
MICROALBUM., U, RANDOM: 3.4 ug/mL

## 2015-01-28 LAB — HEPATIC FUNCTION PANEL
ALT: 44 IU/L (ref 0–44)
AST: 25 IU/L (ref 0–40)
Albumin: 3.7 g/dL (ref 3.5–5.5)
Alkaline Phosphatase: 41 IU/L (ref 39–117)
Bilirubin Total: 0.3 mg/dL (ref 0.0–1.2)
Bilirubin, Direct: 0.09 mg/dL (ref 0.00–0.40)
Total Protein: 6.4 g/dL (ref 6.0–8.5)

## 2015-01-28 LAB — LIPID PANEL
Chol/HDL Ratio: 5.7 ratio — ABNORMAL HIGH (ref 0.0–5.0)
Cholesterol, Total: 142 mg/dL (ref 100–199)
HDL: 25 mg/dL — ABNORMAL LOW (ref >39)
LDL Calculated: 76 mg/dL (ref 0–99)
Triglycerides: 205 mg/dL — ABNORMAL HIGH (ref 0–149)
VLDL Cholesterol Cal: 41 mg/dL — ABNORMAL HIGH (ref 5–40)

## 2015-01-28 LAB — PSA: Prostate Specific Ag, Serum: 0.3 ng/mL (ref 0.0–4.0)

## 2015-02-05 ENCOUNTER — Encounter: Payer: Self-pay | Admitting: Family Medicine

## 2015-03-09 ENCOUNTER — Encounter: Payer: Self-pay | Admitting: Family Medicine

## 2015-03-09 ENCOUNTER — Ambulatory Visit (INDEPENDENT_AMBULATORY_CARE_PROVIDER_SITE_OTHER): Payer: BC Managed Care – PPO | Admitting: Family Medicine

## 2015-03-09 VITALS — BP 130/70 | Temp 98.6°F | Ht 74.0 in | Wt 330.0 lb

## 2015-03-09 DIAGNOSIS — B9689 Other specified bacterial agents as the cause of diseases classified elsewhere: Secondary | ICD-10-CM

## 2015-03-09 DIAGNOSIS — J019 Acute sinusitis, unspecified: Secondary | ICD-10-CM | POA: Diagnosis not present

## 2015-03-09 MED ORDER — AMOXICILLIN-POT CLAVULANATE 875-125 MG PO TABS: 1.0000 | ORAL_TABLET | Freq: Two times a day (BID) | ORAL | Status: DC

## 2015-03-09 NOTE — Progress Notes (Signed)
   Subjective:    Patient ID: Steven Crane, male    DOB: 1960-05-26, 54 y.o.   MRN: [id number]  Sinusitis This is a new problem. The current episode started in the past 7 days. There has been no fever. The pain is moderate. Associated symptoms include congestion, coughing, ear pain, headaches and a sore throat. (Chest tightness) Past treatments include oral decongestants. The treatment provided no relief.   Patient has no other concerns at this time.    Review of Systems  Constitutional: Negative for fever and activity change.  HENT: Positive for congestion, ear pain, rhinorrhea and sore throat.   Eyes: Negative for discharge.  Respiratory: Positive for cough. Negative for wheezing.   Cardiovascular: Negative for chest pain.  Neurological: Positive for headaches.  light headed with coughing Low fever Frontal headache Low energy    Objective:   Physical Exam  Constitutional: He appears well-developed.  HENT:  Head: Normocephalic.  Mouth/Throat: Oropharynx is clear and moist. No oropharyngeal exudate.  Neck: Normal range of motion.  Cardiovascular: Normal rate, regular rhythm and normal heart sounds.   No murmur heard. Pulmonary/Chest: Effort normal and breath sounds normal. He has no wheezes.  Lymphadenopathy:    He has no cervical adenopathy.  Neurological: He exhibits normal muscle tone.  Skin: Skin is warm and dry.  Nursing note and vitals reviewed.         Assessment & Plan:  Viral syndrome Secondary rhinosinusitis Antibiotics prescribed warning signs discussed Follow-up if progressive troubles

## 2015-03-24 ENCOUNTER — Other Ambulatory Visit: Payer: Self-pay | Admitting: *Deleted

## 2015-03-24 ENCOUNTER — Telehealth: Payer: Self-pay | Admitting: Family Medicine

## 2015-03-24 MED ORDER — AZITHROMYCIN 250 MG PO TABS: ORAL_TABLET | ORAL | Status: DC

## 2015-03-24 NOTE — Telephone Encounter (Signed)
I would recommend using albuterol 2 puffs every 4 hours on a regular basis as needed to help with the wheezing. Also go ahead with one round of Zithromax: Z-Pak. If high fevers difficulty breathing or severely worse over the weekend go to the ER if not doing significantly better within the next 5-7 days follow-up here

## 2015-03-24 NOTE — Telephone Encounter (Signed)
Still having cough and chest congestion. Coughing up yellow white mucus. Wheezing. Has inhaler but has not use it yet. No fever.

## 2015-03-24 NOTE — Telephone Encounter (Signed)
Discussed with pt. Med sent to pharm.  

## 2015-03-24 NOTE — Telephone Encounter (Signed)
Pt seen recently for sinus, finished antibiotics, cleared up but now it's down in his chest Been in chest since 03/21/2015, cough & wheezing - can't come in here until Monday Can we call something in?  Please advise   [facility]

## 2015-03-24 NOTE — Telephone Encounter (Signed)
[facility] 12/2

## 2015-05-03 ENCOUNTER — Other Ambulatory Visit: Payer: Self-pay

## 2015-05-03 MED ORDER — OMEPRAZOLE 40 MG PO CPDR: 40.0000 mg | DELAYED_RELEASE_CAPSULE | Freq: Every day | ORAL | Status: DC

## 2015-05-05 ENCOUNTER — Ambulatory Visit: Payer: BC Managed Care – PPO | Admitting: Family Medicine

## 2015-05-09 ENCOUNTER — Other Ambulatory Visit: Payer: Self-pay | Admitting: *Deleted

## 2015-05-09 MED ORDER — OMEPRAZOLE MAGNESIUM 20 MG PO TBEC: 40.0000 mg | DELAYED_RELEASE_TABLET | Freq: Every day | ORAL | Status: DC

## 2015-05-15 ENCOUNTER — Ambulatory Visit (INDEPENDENT_AMBULATORY_CARE_PROVIDER_SITE_OTHER): Payer: BC Managed Care – PPO | Admitting: Nurse Practitioner

## 2015-05-15 ENCOUNTER — Encounter: Payer: Self-pay | Admitting: Family Medicine

## 2015-05-15 ENCOUNTER — Encounter: Payer: Self-pay | Admitting: Nurse Practitioner

## 2015-05-15 VITALS — BP 130/86 | HR 72 | Temp 100.2°F | Ht 74.0 in | Wt 314.4 lb

## 2015-05-15 DIAGNOSIS — B349 Viral infection, unspecified: Secondary | ICD-10-CM

## 2015-05-15 DIAGNOSIS — J0100 Acute maxillary sinusitis, unspecified: Secondary | ICD-10-CM

## 2015-05-15 MED ORDER — HYDROCODONE-HOMATROPINE 5-1.5 MG/5ML PO SYRP: 5.0000 mL | ORAL_SOLUTION | ORAL | Status: DC | PRN

## 2015-05-15 MED ORDER — LEVOFLOXACIN 500 MG PO TABS: 500.0000 mg | ORAL_TABLET | Freq: Every day | ORAL | Status: DC

## 2015-05-15 NOTE — Progress Notes (Signed)
Subjective:  Presents for c/o congestion x 8 days. Began to get worse 3 days ago with low grade fever, sore throat, runny nose, frequent cough, myalgias and fatigue. Slight wheezing. Has used albuterol inhaler twice. Tenderness at maxillary sinus area radiating into the gums. BS slightly elevated at 170-180. Taking fluids well. Voiding nl.   Objective:   BP 130/86 mmHg  Pulse 72  Temp(Src) 100.2 F (37.9 C)  Ht 6\' 2"  (1.88 m)  Wt 314 lb 6.4 oz (142.611 kg)  BMI 40.35 kg/m2 NAD. Alert, oriented. TMs clear effusion. Pharynx mildly injected. Neck supple with moderate tender adenopathy. Lungs clear. Frequent non productive cough noted. Heart RRR.   Assessment: Acute maxillary sinusitis, recurrence not specified  Viral illness  Plan:  Meds ordered this encounter  Medications  . levofloxacin (LEVAQUIN) 500 MG tablet    Sig: Take 1 tablet (500 mg total) by mouth daily.    Dispense:  10 tablet    Refill:  0    Order Specific Question:  Supervising Provider    Answer:  [name] [[id number]]  . HYDROcodone-homatropine (HYCODAN) 5-1.5 MG/5ML syrup    Sig: Take 5 mLs by mouth every 4 (four) hours as needed.    Dispense:  120 mL    Refill:  0    Order Specific Question:  Supervising Provider    Answer:  [name] [[id number]]   OTC Mucinex DM for daytime use. Warning signs reviewed. Call back by end of the week if no improvement, sooner if worse.

## 2015-05-26 ENCOUNTER — Other Ambulatory Visit: Payer: Self-pay | Admitting: *Deleted

## 2015-05-26 ENCOUNTER — Encounter: Payer: Self-pay | Admitting: Family Medicine

## 2015-05-26 ENCOUNTER — Ambulatory Visit (INDEPENDENT_AMBULATORY_CARE_PROVIDER_SITE_OTHER): Payer: BC Managed Care – PPO | Admitting: Family Medicine

## 2015-05-26 VITALS — BP 134/78 | Temp 98.7°F | Ht 74.0 in | Wt 323.2 lb

## 2015-05-26 DIAGNOSIS — J329 Chronic sinusitis, unspecified: Secondary | ICD-10-CM

## 2015-05-26 DIAGNOSIS — I10 Essential (primary) hypertension: Secondary | ICD-10-CM | POA: Diagnosis not present

## 2015-05-26 DIAGNOSIS — E119 Type 2 diabetes mellitus without complications: Secondary | ICD-10-CM

## 2015-05-26 LAB — POCT GLYCOSYLATED HEMOGLOBIN (HGB A1C): HEMOGLOBIN A1C: 8.4

## 2015-05-26 MED ORDER — INSULIN DETEMIR 100 UNIT/ML ~~LOC~~ SOLN: 48.0000 [IU] | Freq: Every day | SUBCUTANEOUS | Status: DC

## 2015-05-26 MED ORDER — DOXYCYCLINE HYCLATE 100 MG PO TABS: ORAL_TABLET | ORAL | Status: DC

## 2015-05-26 MED ORDER — INSULIN GLARGINE 100 UNIT/ML ~~LOC~~ SOLN: SUBCUTANEOUS | Status: DC

## 2015-05-26 NOTE — Patient Instructions (Signed)
If after ten days your sugars are still running mostly higher than 120, add six more units of the insulin

## 2015-05-26 NOTE — Progress Notes (Signed)
   Subjective:    Patient ID: Steven Crane, male    DOB: 10-01-1960, 55 y.o.   MRN: [id number] Patient presents with several distinct concerns Diabetes He presents for his follow-up diabetic visit. He has type 2 diabetes mellitus. No MedicAlert identification noted. He has not had a previous visit with a dietitian. He does not see a podiatrist.Eye exam is current (September 20,2016).   Patient has c/o of sinus drainage, and ear pain. Patient was seen on 05/15/15 with no relief. Currently taking Levaquin. Notes ongoing congestion ear fullness stuffiness.  Results for orders placed or performed in visit on 05/26/15  POCT HgB A1C  Result Value Ref Range   Hemoglobin A1C 8.4    Claims compliance with blood pressure medicine. Meds reviewed today. No obvious side effects does not miss a dose.  Review of Systems No headache no chest pain no shortness of breath no abdominal pain    Objective:   Physical Exam Alert vitals stable substantial obesity present. HEENT frontal tenderness nasal congestion otherwise normal. Lungs clear heart regular in rhythm. Ankles trace edema      Assessment & Plan:  Impression 1 type 2 diabetes suboptimum discuss will need to titrate up insulin No. 2 hypertension good control maintain same meds meds reviewed #3 subacute rhinosinusitis. Discussed. Plan doxycycline for protracted course. Symptomatic care discussed. Maintain usual blood pressure and lipid meds diet exercise discussed. Add 6 units to insulin. 6 more in 10 days if numbers still elevated. Switch from Lantus to Levemir WSL

## 2015-06-09 ENCOUNTER — Encounter: Payer: Self-pay | Admitting: Cardiovascular Disease

## 2015-06-09 ENCOUNTER — Ambulatory Visit (INDEPENDENT_AMBULATORY_CARE_PROVIDER_SITE_OTHER): Payer: BC Managed Care – PPO | Admitting: Cardiovascular Disease

## 2015-06-09 VITALS — BP 147/77 | HR 71 | Ht 74.0 in | Wt 330.0 lb

## 2015-06-09 DIAGNOSIS — E785 Hyperlipidemia, unspecified: Secondary | ICD-10-CM | POA: Diagnosis not present

## 2015-06-09 DIAGNOSIS — Z955 Presence of coronary angioplasty implant and graft: Secondary | ICD-10-CM

## 2015-06-09 DIAGNOSIS — I10 Essential (primary) hypertension: Secondary | ICD-10-CM | POA: Diagnosis not present

## 2015-06-09 DIAGNOSIS — I2510 Atherosclerotic heart disease of native coronary artery without angina pectoris: Secondary | ICD-10-CM

## 2015-06-09 MED ORDER — AMLODIPINE BESYLATE 5 MG PO TABS: 5.0000 mg | ORAL_TABLET | Freq: Every day | ORAL | Status: DC

## 2015-06-09 NOTE — Progress Notes (Signed)
Patient ID: Steven Crane, male   DOB: [birth year], 55 y.o.   MRN: [id number]      SUBJECTIVE: The patient presents for routine cardiovascular followup. He underwent drug-eluting stent placement to the proximal first obtuse marginal branch as well as the distal RCA in 08/2013.  He denies chest pain and shortness of breath. Says he needs to exercise and lose weight.   Review of Systems: As per "subjective", otherwise negative.  Allergies  Allergen Reactions  . Adhesive [Tape] Rash    Current Outpatient Prescriptions  Medication Sig Dispense Refill  . acetaminophen (TYLENOL) 500 MG tablet Take 1,000 mg by mouth 2 (two) times daily.    Marland Kitchen albuterol (PROVENTIL HFA;VENTOLIN HFA) 108 (90 BASE) MCG/ACT inhaler Inhale 2 puffs into the lungs every 6 (six) hours as needed for wheezing or shortness of breath. 1 Inhaler 2  . ALPRAZolam (XANAX) 0.5 MG tablet TAKE ONE-HALF TO ONE TABLET BY MOUTH ONCE DAILY AS NEEDED FOR ANXIETY (NORMALLY TAKES AT BEDTIME BUT WILL TAKE DURING THE DAY AS NEEDED) 30 tablet 5  . aspirin EC 81 MG tablet Take 81 mg by mouth every evening.    . clopidogrel (PLAVIX) 75 MG tablet Take 1 tablet (75 mg total) by mouth daily with breakfast. 30 tablet 5  . doxycycline (VIBRA-TABS) 100 MG tablet Take 1 tablet by mouth twice a day for 14 days 28 tablet 0  . enalapril (VASOTEC) 20 MG tablet Take 1 tablet (20 mg total) by mouth 2 (two) times daily. 60 tablet 5  . fenofibrate 160 MG tablet Take 1 tablet (160 mg total) by mouth daily. 30 tablet 5  . glyBURIDE-metformin (GLUCOVANCE) 5-500 MG tablet Take 2 tablets by mouth 2 (two) times daily. 120 tablet 5  . insulin detemir (LEVEMIR) 100 UNIT/ML injection Inject 0.48 mLs (48 Units total) into the skin at bedtime. 10 mL 0  . metoprolol succinate (TOPROL-XL) 50 MG 24 hr tablet Take 1 tablet (50 mg total) by mouth daily. 30 tablet 6  . nitroGLYCERIN (NITROSTAT) 0.4 MG SL tablet Place 1 tablet (0.4 mg total) under the tongue every 5 (five)  minutes as needed for chest pain. If no relief call 911. 60 tablet 12  . omeprazole (PRILOSEC) 40 MG capsule Take 1 capsule (40 mg total) by mouth daily. 30 capsule 5  . pravastatin (PRAVACHOL) 40 MG tablet Take 1 tablet (40 mg total) by mouth daily. 30 tablet 5   No current facility-administered medications for this visit.    Past Medical History  Diagnosis Date  . Coronary artery disease May 2015    s/p successful PTCA/DES x 1 to distal RCA and PTCA/DES x 1 to OM-20 Aug 2013  . Closed head injury 1975  . Hypertension   . Hyperlipemia   . GERD (gastroesophageal reflux disease)   . Morbid obesity ([place])   . Hypertriglyceridemia   . Psoriasis   . DVT (deep venous thrombosis) ([place]) 2008    "LLE"  . Myocardial infarction ([place]) 02/2010  . CHF (congestive heart failure) ([place]) 02/2010  . Asthma   . Chronic bronchitis ([place])     "get it q year"  . Type II diabetes mellitus ([place])   . Headache(784.0)     "comes and goes; sometimes qd; sometimes weekly" (08/27/2013)  . Arthritis     "hands, back, knees" (08/27/2013)  . Chronic lower back pain   . LV dysfunction     LVEF 40% at time of cardiac cath May 2015    Past  Surgical History  Procedure Laterality Date  . Knee arthroscopy Left 1981  . Colonoscopy  01/06/2012    Procedure: COLONOSCOPY;  Surgeon: [name], MD;  Location: [facility] ENDO SUITE;  Service: Endoscopy;  Laterality: N/A;  7:30 AM  . Coronary angioplasty with stent placement  02/2010; 08/27/2013    "1 + 3"   . Left heart catheterization with coronary angiogram N/A 08/27/2013    Procedure: LEFT HEART CATHETERIZATION WITH CORONARY ANGIOGRAM;  Surgeon: [name], MD;  Location: [facility] CATH LAB;  Service: Cardiovascular;  Laterality: N/A;    Social History   Social History  . Marital Status: Single    Spouse Name: N/A  . Number of Children: N/A  . Years of Education: N/A   Occupational History  . Not on file.   Social History Main Topics  . Smoking status: Never  Smoker   . Smokeless tobacco: Current User    Types: Snuff, Chew     Comment: "started dipping @ age 85; quit for 5 1/2 yrs at one time; hadn't chewed in awhile"  . Alcohol Use: 0.0 oz/week    0 Standard drinks or equivalent per week     Comment: 08/27/2013 "drink beer and liquor; don't drink much"  . Drug Use: No  . Sexual Activity: Yes   Other Topics Concern  . Not on file   Social History Narrative     Filed Vitals:   06/09/15 0904  BP: 147/77  Pulse: 71  Height: 6\' 2"  (1.88 m)  Weight: 330 lb (149.687 kg)    PHYSICAL EXAM General: NAD, morbidly obese  Neck: No JVD, no thyromegaly.  Lungs: Clear to auscultation bilaterally with normal respiratory effort.  CV: Nondisplaced PMI. Regular rate and rhythm, normal S1/S2, no S3/S4, no murmur. No pretibial or periankle edema.  Abdomen: Soft, obese, no distention.  Neurologic: Alert and oriented x 3.  Psych: Normal affect.  Extremities: No clubbing or cyanosis.  Skin: Normal. Musculoskeletal: Grossly normal. Extremities: No clubbing or cyanosis.   ECG: Most recent ECG reviewed.      ASSESSMENT AND PLAN: 1. CAD s/p DES x 2 to proximal OM1 and distal RCA in 08/2013: Stable ischemic heart disease. Continue aspirin, metoprolol, and pravastatin. Will d/c Plavix.  2. Essential HTN: Mildly elevated on enalapril 20 mg bid. Previously encouraged therapeutic lifestyle changes, but he has not lost weight. Will add amlodipine 5 mg daily.  3. Hyperlipidemia: Lipids on 01/27/15 showed total cholesterol 142, triglycerides 205, HDL 25, LDL 76. Continue pravastatin 40 mg daily.  Dispo: f/u 1 year.  Kate Sable, M.D., F.A.C.C.

## 2015-06-09 NOTE — Patient Instructions (Signed)
Your physician wants you to follow-up in: [place] You will receive a reminder letter in the mail two months in advance. If you don't receive a letter, please call our office to schedule the follow-up appointment.  Your physician has recommended you make the following change in your medication:   STOP PLAVIX - YOU MAY FINISH THE REMAINDER YOU HAVE LEFT  START AMLODIPINE 5 MG DAILY  Thank you for choosing [facility]!!

## 2015-06-27 ENCOUNTER — Other Ambulatory Visit: Payer: Self-pay | Admitting: Family Medicine

## 2015-07-06 ENCOUNTER — Other Ambulatory Visit: Payer: Self-pay | Admitting: Family Medicine

## 2015-07-07 NOTE — Telephone Encounter (Signed)
Six mo ok 

## 2015-07-31 ENCOUNTER — Encounter: Payer: Self-pay | Admitting: Family Medicine

## 2015-07-31 ENCOUNTER — Ambulatory Visit (INDEPENDENT_AMBULATORY_CARE_PROVIDER_SITE_OTHER): Payer: BC Managed Care – PPO | Admitting: Family Medicine

## 2015-07-31 VITALS — BP 126/80 | Ht 74.0 in | Wt 320.5 lb

## 2015-07-31 DIAGNOSIS — R600 Localized edema: Secondary | ICD-10-CM | POA: Diagnosis not present

## 2015-07-31 DIAGNOSIS — L03116 Cellulitis of left lower limb: Secondary | ICD-10-CM

## 2015-07-31 MED ORDER — AMOXICILLIN-POT CLAVULANATE 875-125 MG PO TABS: 1.0000 | ORAL_TABLET | Freq: Two times a day (BID) | ORAL | Status: DC

## 2015-07-31 NOTE — Progress Notes (Signed)
   Subjective:    Patient ID: Steven Crane, male    DOB: Sep 26, 1960, 55 y.o.   MRN: [id number]  Leg Pain  The incident occurred more than 1 week ago. The injury mechanism was a direct blow. The pain is present in the left leg.  Patient was working states big limb broke off the ground and hit his leg he relates pain and swelling discomfort present over the past week swelling is stayed consistent but the discomfort in the front of the leg has become worse with some redness and warmth he states it did not break the skin. PMH benign no hemoptysis no shortness of breath Patient also has concerns of cough. Patient relates head congestion drainage sinus symptoms allergy symptoms as well no prior troubles.    Review of Systems Head congestion drainage coughing denies wheezing difficulty breathing relates left leg swelling painful tenderness    Objective:   Physical Exam Anterior leg tenderness on the left side redness swollen tender back in the calf nontender foot is normal. Knee is normal. 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 Given that it is 5:15 PM will not be over get ultrasound done tonight and will be getting it done for second morning even though the likelihood is low it does need to be ruled out.

## 2015-08-01 ENCOUNTER — Ambulatory Visit (HOSPITAL_COMMUNITY)
Admission: RE | Admit: 2015-08-01 | Discharge: 2015-08-01 | Disposition: A | Discharge status: Home / Self Care | Payer: BC Managed Care – PPO | Source: Ambulatory Visit | Attending: Family Medicine | Admitting: Family Medicine

## 2015-08-01 DIAGNOSIS — M79605 Pain in left leg: Secondary | ICD-10-CM | POA: Diagnosis not present

## 2015-08-01 DIAGNOSIS — Z86718 Personal history of other venous thrombosis and embolism: Secondary | ICD-10-CM | POA: Insufficient documentation

## 2015-08-07 ENCOUNTER — Other Ambulatory Visit: Payer: Self-pay | Admitting: Family Medicine

## 2015-08-07 ENCOUNTER — Encounter: Payer: Self-pay | Admitting: Family Medicine

## 2015-08-07 ENCOUNTER — Ambulatory Visit (INDEPENDENT_AMBULATORY_CARE_PROVIDER_SITE_OTHER): Payer: BC Managed Care – PPO | Admitting: Family Medicine

## 2015-08-07 ENCOUNTER — Telehealth: Payer: Self-pay | Admitting: Family Medicine

## 2015-08-07 VITALS — BP 130/80 | Temp 97.6°F | Ht 74.0 in | Wt 317.4 lb

## 2015-08-07 DIAGNOSIS — Z79899 Other long term (current) drug therapy: Secondary | ICD-10-CM

## 2015-08-07 DIAGNOSIS — L03116 Cellulitis of left lower limb: Secondary | ICD-10-CM

## 2015-08-07 DIAGNOSIS — E119 Type 2 diabetes mellitus without complications: Secondary | ICD-10-CM

## 2015-08-07 DIAGNOSIS — E785 Hyperlipidemia, unspecified: Secondary | ICD-10-CM

## 2015-08-07 MED ORDER — DOXYCYCLINE HYCLATE 100 MG PO TABS
ORAL_TABLET | ORAL | Status: DC
Start: 2015-08-07 — End: 2015-08-25

## 2015-08-07 NOTE — Telephone Encounter (Signed)
Orders for bloodwork put in. Pt notified on voicemail.  

## 2015-08-07 NOTE — Addendum Note (Signed)
Addended by: Carmelina Noun on: 08/07/2015 10:49 AM   Modules accepted: Orders

## 2015-08-07 NOTE — Telephone Encounter (Signed)
bw orders please if the pt needs them for his appt on 5/5  Last labs 01/27/15 PSA, BMP, Lip, Hep, Microalbumin/creatinine urine

## 2015-08-07 NOTE — Progress Notes (Signed)
   Subjective:    Patient ID: Steven Crane, male    DOB: 1960/11/16, 55 y.o.   MRN: [id number]  HPI Patient is here today for a recheck on cellulitis of the left leg. Patient is currently taking Augmentin. Patient states that symptoms are improving. Slight redness and swelling at the site.   Patient states that she has no other concerns at this time.    Review of Systems No headache, no major weight loss or weight gain, no chest pain no back pain abdominal pain no change in bowel habits complete ROS otherwise negative     Objective:   Physical Exam  Alert vital stable lungs clear heart rare rhythm left anterior shin palpable mass no fluctuance positive erythema positive tenderness ankles trace edema       Assessment & Plan:  Impression resolving hematoma with element of cellulitis and known diabetic patient. Negative ultrasound last week plan switch to doxycycline twice a day 10 days. Symptom care discussed follow-up right appointment warning signs discussed

## 2015-08-07 NOTE — Telephone Encounter (Signed)
Lip liv A1c 

## 2015-08-08 ENCOUNTER — Ambulatory Visit: Payer: BC Managed Care – PPO | Admitting: Family Medicine

## 2015-08-24 LAB — LIPID PANEL
Chol/HDL Ratio: 4.2 ratio units (ref 0.0–5.0)
Cholesterol, Total: 126 mg/dL (ref 100–199)
HDL: 30 mg/dL — AB (ref >39)
LDL CALC: 66 mg/dL (ref 0–99)
Triglycerides: 148 mg/dL (ref 0–149)
VLDL CHOLESTEROL CAL: 30 mg/dL (ref 5–40)

## 2015-08-24 LAB — HEMOGLOBIN A1C
Est. average glucose Bld gHb Est-mCnc: 229 mg/dL
HEMOGLOBIN A1C: 9.6 % — AB (ref 4.8–5.6)

## 2015-08-24 LAB — HEPATIC FUNCTION PANEL
ALK PHOS: 44 IU/L (ref 39–117)
ALT: 47 IU/L — ABNORMAL HIGH (ref 0–44)
AST: 32 IU/L (ref 0–40)
Albumin: 3.7 g/dL (ref 3.5–5.5)
Bilirubin Total: 0.2 mg/dL (ref 0.0–1.2)
Bilirubin, Direct: 0.1 mg/dL (ref 0.00–0.40)
TOTAL PROTEIN: 6.3 g/dL (ref 6.0–8.5)

## 2015-08-25 ENCOUNTER — Encounter: Payer: Self-pay | Admitting: Family Medicine

## 2015-08-25 ENCOUNTER — Ambulatory Visit (INDEPENDENT_AMBULATORY_CARE_PROVIDER_SITE_OTHER): Payer: BC Managed Care – PPO | Admitting: Family Medicine

## 2015-08-25 VITALS — BP 138/86 | Ht 74.0 in | Wt 324.4 lb

## 2015-08-25 DIAGNOSIS — E785 Hyperlipidemia, unspecified: Secondary | ICD-10-CM | POA: Diagnosis not present

## 2015-08-25 DIAGNOSIS — I2510 Atherosclerotic heart disease of native coronary artery without angina pectoris: Secondary | ICD-10-CM

## 2015-08-25 DIAGNOSIS — Z9861 Coronary angioplasty status: Secondary | ICD-10-CM

## 2015-08-25 DIAGNOSIS — E119 Type 2 diabetes mellitus without complications: Secondary | ICD-10-CM

## 2015-08-25 DIAGNOSIS — I10 Essential (primary) hypertension: Secondary | ICD-10-CM | POA: Diagnosis not present

## 2015-08-25 MED ORDER — FENOFIBRATE 160 MG PO TABS: 160.0000 mg | ORAL_TABLET | Freq: Every day | ORAL | Status: DC

## 2015-08-25 MED ORDER — INSULIN DETEMIR 100 UNIT/ML ~~LOC~~ SOLN: SUBCUTANEOUS | Status: DC

## 2015-08-25 MED ORDER — PRAVASTATIN SODIUM 40 MG PO TABS: 40.0000 mg | ORAL_TABLET | Freq: Every day | ORAL | Status: DC

## 2015-08-25 MED ORDER — AMLODIPINE BESYLATE 5 MG PO TABS: 5.0000 mg | ORAL_TABLET | Freq: Every day | ORAL | Status: DC

## 2015-08-25 MED ORDER — GLYBURIDE-METFORMIN 5-500 MG PO TABS: 2.0000 | ORAL_TABLET | Freq: Two times a day (BID) | ORAL | Status: DC

## 2015-08-25 MED ORDER — ENALAPRIL MALEATE 20 MG PO TABS: 20.0000 mg | ORAL_TABLET | Freq: Two times a day (BID) | ORAL | Status: DC

## 2015-08-25 MED ORDER — METOPROLOL SUCCINATE ER 50 MG PO TB24: 50.0000 mg | ORAL_TABLET | Freq: Every day | ORAL | Status: DC

## 2015-08-25 MED ORDER — OMEPRAZOLE 40 MG PO CPDR: 40.0000 mg | DELAYED_RELEASE_CAPSULE | Freq: Every day | ORAL | Status: DC

## 2015-08-25 NOTE — Progress Notes (Signed)
   Subjective:    Patient ID: Steven Crane, male    DOB: 04/01/1961, 55 y.o.   MRN: [id number]  Diabetes He presents for his follow-up diabetic visit. He has type 2 diabetes mellitus. Risk factors for coronary artery disease include diabetes mellitus, dyslipidemia, hypertension and obesity. Current diabetic treatment includes insulin injections and oral agent (monotherapy). He is compliant with treatment all of the time. His weight is stable. He is following a diabetic diet. He has not had a previous visit with a dietitian. He does not see a podiatrist.Eye exam is current.      patient claims compliance with blood pressure medication. No obvious side effects. Watching salt intake. Does not miss a dose generally numbers good when checked elsewhere next   patient claims compliance with lipid medicine. Trying to watch fats in diet. Notes importance of his medicines does not miss a dose   no obvious chest pain or discomfort exertional or otherwise. Known history of coronary artery disease Discuss recent labs Results for orders placed or performed in visit on 08/07/15  Lipid panel  Result Value Ref Range   Cholesterol, Total 126 100 - 199 mg/dL   Triglycerides 148 0 - 149 mg/dL   HDL 30 (L) >39 mg/dL   VLDL Cholesterol Cal 30 5 - 40 mg/dL   LDL Calculated 66 0 - 99 mg/dL   Chol/HDL Ratio 4.2 0.0 - 5.0 ratio units  Hepatic function panel  Result Value Ref Range   Total Protein 6.3 6.0 - 8.5 g/dL   Albumin 3.7 3.5 - 5.5 g/dL   Bilirubin Total 0.2 0.0 - 1.2 mg/dL   Bilirubin, Direct 0.10 0.00 - 0.40 mg/dL   Alkaline Phosphatase 44 39 - 117 IU/L   AST 32 0 - 40 IU/L   ALT 47 (H) 0 - 44 IU/L  Hemoglobin A1c  Result Value Ref Range   Hgb A1c MFr Bld 9.6 (H) 4.8 - 5.6 %   Est. average glucose Bld gHb Est-mCnc 229 mg/dL     Review of Systems No headache, no major weight loss or weight gain, no chest pain no back pain abdominal pain no change in bowel habits complete ROS otherwise  negative     Objective:   Physical Exam   alert vital stable substantial obesity present HEENT normal lungs clear. Heart regular rate and rhythm. Ankles without edema feet pulses good sensation intact      Assessment & Plan:   impression type 2 diabetes control poor discussed need to tighten up on increase insulin rationale discussed #2 hypertension control discussed maintain same meds #3 hyperlipidemia good control discussed maintain same medicine for morbid obesity discussed important exercise noted plan diet exercise discussed. Increase insulin recheck in several months. Multiple questions answered WSL

## 2015-08-27 ENCOUNTER — Encounter: Payer: Self-pay | Admitting: Family Medicine

## 2015-11-01 ENCOUNTER — Encounter: Payer: Self-pay | Admitting: Family Medicine

## 2015-11-01 ENCOUNTER — Ambulatory Visit (INDEPENDENT_AMBULATORY_CARE_PROVIDER_SITE_OTHER): Payer: BC Managed Care – PPO | Admitting: Family Medicine

## 2015-11-01 VITALS — BP 130/70 | Temp 98.5°F | Ht 74.0 in | Wt 318.0 lb

## 2015-11-01 DIAGNOSIS — B9689 Other specified bacterial agents as the cause of diseases classified elsewhere: Secondary | ICD-10-CM

## 2015-11-01 DIAGNOSIS — J019 Acute sinusitis, unspecified: Secondary | ICD-10-CM | POA: Diagnosis not present

## 2015-11-01 DIAGNOSIS — J4520 Mild intermittent asthma, uncomplicated: Secondary | ICD-10-CM

## 2015-11-01 DIAGNOSIS — J209 Acute bronchitis, unspecified: Secondary | ICD-10-CM | POA: Diagnosis not present

## 2015-11-01 MED ORDER — LEVOFLOXACIN 500 MG PO TABS: 500.0000 mg | ORAL_TABLET | Freq: Every day | ORAL | Status: AC

## 2015-11-01 NOTE — Progress Notes (Signed)
   Subjective:    Patient ID: Steven Crane, male    DOB: 03/01/1961, 55 y.o.   MRN: [id number]  URI  This is a new problem. The current episode started in the past 7 days. The problem has been unchanged. There has been no fever. Associated symptoms include congestion, coughing, ear pain, headaches, sneezing and wheezing. He has tried nothing for the symptoms. The treatment provided no relief.   Patient has no other concerns at this time.   Pos gunky  No fever  Energy down some  Still working  Review of Systems  HENT: Positive for congestion, ear pain and sneezing.   Respiratory: Positive for cough and wheezing.   Neurological: Positive for headaches.       Objective:   Physical Exam Alert, mild malaise. Hydration good Vitals stable. frontal/ maxillary tenderness evident positive nasal congestion. pharynx normal neck supple  lungs clear/no crackles or wheezes. heart regular in rhythm        Assessment & Plan:  Impression rhinosinusitis/Bronchitis likely post viral, discussed with patient. plan antibiotics prescribed. Questions answered. Symptomatic care discussed. warning signs discussed. Also substantial element of reactive airways not enough for steroids but needs inhaler to use faithfully WSL

## 2015-11-24 ENCOUNTER — Telehealth: Payer: Self-pay | Admitting: Family Medicine

## 2015-11-24 ENCOUNTER — Ambulatory Visit (INDEPENDENT_AMBULATORY_CARE_PROVIDER_SITE_OTHER): Payer: BC Managed Care – PPO | Admitting: Family Medicine

## 2015-11-24 ENCOUNTER — Encounter: Payer: Self-pay | Admitting: Family Medicine

## 2015-11-24 ENCOUNTER — Ambulatory Visit (HOSPITAL_COMMUNITY)
Admission: RE | Admit: 2015-11-24 | Discharge: 2015-11-24 | Disposition: A | Discharge status: Home / Self Care | Payer: BC Managed Care – PPO | Source: Ambulatory Visit | Attending: Family Medicine | Admitting: Family Medicine

## 2015-11-24 VITALS — BP 132/78 | Ht 74.0 in | Wt 318.0 lb

## 2015-11-24 DIAGNOSIS — M2342 Loose body in knee, left knee: Secondary | ICD-10-CM | POA: Diagnosis not present

## 2015-11-24 DIAGNOSIS — M25562 Pain in left knee: Secondary | ICD-10-CM | POA: Insufficient documentation

## 2015-11-24 DIAGNOSIS — E119 Type 2 diabetes mellitus without complications: Secondary | ICD-10-CM

## 2015-11-24 DIAGNOSIS — E785 Hyperlipidemia, unspecified: Secondary | ICD-10-CM | POA: Diagnosis not present

## 2015-11-24 DIAGNOSIS — I10 Essential (primary) hypertension: Secondary | ICD-10-CM | POA: Diagnosis not present

## 2015-11-24 DIAGNOSIS — M1712 Unilateral primary osteoarthritis, left knee: Secondary | ICD-10-CM | POA: Insufficient documentation

## 2015-11-24 DIAGNOSIS — Z125 Encounter for screening for malignant neoplasm of prostate: Secondary | ICD-10-CM

## 2015-11-24 LAB — POCT GLYCOSYLATED HEMOGLOBIN (HGB A1C): Hemoglobin A1C: 8.1

## 2015-11-24 MED ORDER — ETODOLAC 400 MG PO TABS: 400.0000 mg | ORAL_TABLET | Freq: Two times a day (BID) | ORAL | 0 refills | Status: DC

## 2015-11-24 MED ORDER — GLYBURIDE-METFORMIN 5-500 MG PO TABS: 2.0000 | ORAL_TABLET | Freq: Two times a day (BID) | ORAL | 5 refills | Status: DC

## 2015-11-24 MED ORDER — ENALAPRIL MALEATE 20 MG PO TABS: 20.0000 mg | ORAL_TABLET | Freq: Two times a day (BID) | ORAL | 5 refills | Status: DC

## 2015-11-24 MED ORDER — FENOFIBRATE 160 MG PO TABS: 160.0000 mg | ORAL_TABLET | Freq: Every day | ORAL | 5 refills | Status: DC

## 2015-11-24 MED ORDER — OMEPRAZOLE 40 MG PO CPDR: 40.0000 mg | DELAYED_RELEASE_CAPSULE | Freq: Every day | ORAL | 5 refills | Status: DC

## 2015-11-24 MED ORDER — ALPRAZOLAM 0.5 MG PO TABS: ORAL_TABLET | ORAL | 5 refills | Status: DC

## 2015-11-24 MED ORDER — PRAVASTATIN SODIUM 40 MG PO TABS: 40.0000 mg | ORAL_TABLET | Freq: Every day | ORAL | 5 refills | Status: DC

## 2015-11-24 MED ORDER — AMLODIPINE BESYLATE 5 MG PO TABS: 5.0000 mg | ORAL_TABLET | Freq: Every day | ORAL | 1 refills | Status: DC

## 2015-11-24 MED ORDER — INSULIN DETEMIR 100 UNIT/ML ~~LOC~~ SOLN: SUBCUTANEOUS | 5 refills | Status: DC

## 2015-11-24 MED ORDER — METOPROLOL SUCCINATE ER 50 MG PO TB24: 50.0000 mg | ORAL_TABLET | Freq: Every day | ORAL | 5 refills | Status: DC

## 2015-11-24 NOTE — Telephone Encounter (Signed)
Lip liv m7 A1c psa 

## 2015-11-24 NOTE — Telephone Encounter (Signed)
Blood work ordered in EPIC. Patient notified. 

## 2015-11-24 NOTE — Progress Notes (Signed)
   Subjective:    Patient ID: Steven Crane, male    DOB: 03/17/1961, 55 y.o.   MRN: [id number]  Diabetes  He presents for his follow-up diabetic visit. He has type 2 diabetes mellitus. Current diabetic treatment includes insulin injections. Exercise: tries to walk every day. Home blood sugar record trend: pt has not check sugars the past week because he ran out of insulin. He does not see a podiatrist.Eye exam is current.   A1C 8.1.had run out of insulin for a month, was running higher    Left knee popped about 3 weeks ago and has been hurting ever since.  Stepped out of a truck and felt a popping sensation, feels sore around the knee cap. Taking a ce bandage prn  Blood pressure medicine and blood pressure levels reviewed today with patient. Compliant with blood pressure medicine. States does not miss a dose. No obvious side effects. Blood pressure generally good when checked elsewhere. Watching salt intake.  Patient continues to take lipid medication regularly. No obvious side effects from it. Generally does not miss a dose. Prior blood work results are reviewed with patient. Patient continues to work on fat intake in diet  Patient also notes some local swelling with umbilical hernia no particular pain or tenderness just one or if he needed do something for  Review of Systems    No headache, no major weight loss or weight gain, no chest pain no back pain abdominal pain no change in bowel habits complete ROS otherwise negative  Objective:   Physical Exam Alert vitals stable, NAD. Blood pressure good on repeat. HEENT normal. Lungs clear. Heart regular rate and rhythm. Substantial morbid obesity present. Palpable umbilical hernia present. Left knee impressive crepitations and mild effusion no joint line tenderness       Assessment & Plan:  Impression 1 type 2 diabetes improving though not ideal. Was out of insulin for weeks and may have contributed #2 hypertension good control meds  discussed maintain same #3 hyperlipidemia meds reviewed lipids reviewed to maintain same #4 umbilical hernia advised watchful waiting #5 progressive left knee pain plan knee x-ray. Meds refilled diet exercise discussed recheck in several months [facility]

## 2015-11-24 NOTE — Telephone Encounter (Signed)
Pt wants to know if he needs BW orders for his appt in Nov  Last labs 08/23/15  A1C, Hep, Lip

## 2016-02-13 ENCOUNTER — Ambulatory Visit (INDEPENDENT_AMBULATORY_CARE_PROVIDER_SITE_OTHER): Payer: BC Managed Care – PPO | Admitting: Family Medicine

## 2016-02-13 ENCOUNTER — Encounter: Payer: Self-pay | Admitting: Family Medicine

## 2016-02-13 VITALS — BP 142/80 | Temp 98.6°F | Ht 74.0 in | Wt 320.0 lb

## 2016-02-13 DIAGNOSIS — E119 Type 2 diabetes mellitus without complications: Secondary | ICD-10-CM

## 2016-02-13 DIAGNOSIS — B9689 Other specified bacterial agents as the cause of diseases classified elsewhere: Secondary | ICD-10-CM

## 2016-02-13 DIAGNOSIS — I10 Essential (primary) hypertension: Secondary | ICD-10-CM | POA: Diagnosis not present

## 2016-02-13 DIAGNOSIS — Z125 Encounter for screening for malignant neoplasm of prostate: Secondary | ICD-10-CM | POA: Diagnosis not present

## 2016-02-13 DIAGNOSIS — Z79899 Other long term (current) drug therapy: Secondary | ICD-10-CM | POA: Diagnosis not present

## 2016-02-13 DIAGNOSIS — J019 Acute sinusitis, unspecified: Secondary | ICD-10-CM | POA: Diagnosis not present

## 2016-02-13 DIAGNOSIS — E785 Hyperlipidemia, unspecified: Secondary | ICD-10-CM | POA: Diagnosis not present

## 2016-02-13 LAB — POCT GLYCOSYLATED HEMOGLOBIN (HGB A1C): HEMOGLOBIN A1C: 8.9

## 2016-02-13 MED ORDER — INSULIN DETEMIR 100 UNIT/ML ~~LOC~~ SOLN: SUBCUTANEOUS | 5 refills | Status: DC

## 2016-02-13 MED ORDER — LEVOFLOXACIN 500 MG PO TABS: 500.0000 mg | ORAL_TABLET | Freq: Every day | ORAL | 0 refills | Status: DC

## 2016-02-13 MED ORDER — IBUPROFEN 800 MG PO TABS: ORAL_TABLET | ORAL | 2 refills | Status: DC

## 2016-02-13 NOTE — Progress Notes (Signed)
Subjective:     Patient ID: Steven Crane, male   DOB: 07-24-1960, 55 y.o.   MRN: [id number] Patient arrives office with numerous concerns Diabetes  He presents for his follow-up diabetic visit. He has type 2 diabetes mellitus. He is compliant with treatment all of the time. He is following a generally unhealthy diet. He rarely participates in exercise. Home blood sugar record trend: does not check blood sugar. He does not see a podiatrist.Eye exam is not current (last sept. will call to schedule on this year).  A1C today 8.9.  Results for orders placed or performed in visit on 11/24/15  POCT glycosylated hemoglobin (Hb A1C)  Result Value Ref Range   Hemoglobin A1C 8.1    Results for orders placed or performed in visit on 11/24/15  POCT glycosylated hemoglobin (Hb A1C)  Result Value Ref Range   Hemoglobin A1C 8.1     Sinus symptoms. Congestion, ear pain, cough and wheezing. Started last week. Cough productive positive frontal headache  Eye doc last sept   Blood pressure medicine and blood pressure levels reviewed today with patient. Compliant with blood pressure medicine. States does not miss a dose. No obvious side effects. Blood pressure generally good when checked elsewhere. Watching salt intake.  Patient continues to take lipid medication regularly. No obvious side effects from it. Generally does not miss a dose. Prior blood work results are reviewed with patient. Patient continues to work on fat intake in diet  One thirty or one forty in the morning Review of Systems     Objective:   Physical Exam Alert no acute distress blood pressure improved on repeat. HEENT moderate nasal congestion frontal tenderness pharynx normal. Lungs clear heart regular in rhythm. Ankles without edema diabetic foot exam within normal limits    Assessment:     Impression plan #1 rhinosinusitis #2 type 2 diabetes control suboptimal in discussed. Insulin adjusted. #3 hypertension good control discussed  maintain same meds #4 hyperlipidemia prior blood work reviewed. Maintain same awaiting the results. Diet exercise discussed encourage. Follow-up as scheduled    Plan:

## 2016-02-15 LAB — BASIC METABOLIC PANEL
BUN / CREAT RATIO: 21 — AB (ref 9–20)
BUN: 16 mg/dL (ref 6–24)
CALCIUM: 9.3 mg/dL (ref 8.7–10.2)
CHLORIDE: 100 mmol/L (ref 96–106)
CO2: 25 mmol/L (ref 18–29)
Creatinine, Ser: 0.78 mg/dL (ref 0.76–1.27)
GFR calc non Af Amer: 102 mL/min/{1.73_m2} (ref >59)
GFR, EST AFRICAN AMERICAN: 117 mL/min/{1.73_m2} (ref >59)
Glucose: 165 mg/dL — ABNORMAL HIGH (ref 65–99)
POTASSIUM: 4.9 mmol/L (ref 3.5–5.2)
SODIUM: 140 mmol/L (ref 134–144)

## 2016-02-15 LAB — HEPATIC FUNCTION PANEL
ALK PHOS: 45 IU/L (ref 39–117)
ALT: 35 IU/L (ref 0–44)
AST: 20 IU/L (ref 0–40)
Albumin: 4 g/dL (ref 3.5–5.5)
BILIRUBIN TOTAL: 0.3 mg/dL (ref 0.0–1.2)
BILIRUBIN, DIRECT: 0.11 mg/dL (ref 0.00–0.40)
Total Protein: 6.8 g/dL (ref 6.0–8.5)

## 2016-02-15 LAB — LIPID PANEL
CHOLESTEROL TOTAL: 145 mg/dL (ref 100–199)
Chol/HDL Ratio: 4.4 ratio units (ref 0.0–5.0)
HDL: 33 mg/dL — ABNORMAL LOW (ref >39)
LDL CALC: 79 mg/dL (ref 0–99)
TRIGLYCERIDES: 167 mg/dL — AB (ref 0–149)
VLDL Cholesterol Cal: 33 mg/dL (ref 5–40)

## 2016-02-15 LAB — PSA: Prostate Specific Ag, Serum: 0.3 ng/mL (ref 0.0–4.0)

## 2016-02-15 LAB — MICROALBUMIN / CREATININE URINE RATIO
Creatinine, Urine: 82.2 mg/dL
Microalbumin, Urine: <3 ug/mL

## 2016-02-19 ENCOUNTER — Encounter: Payer: Self-pay | Admitting: Family Medicine

## 2016-02-23 ENCOUNTER — Ambulatory Visit: Payer: BC Managed Care – PPO | Admitting: Family Medicine

## 2016-05-14 ENCOUNTER — Telehealth: Payer: Self-pay | Admitting: Family Medicine

## 2016-05-14 NOTE — Telephone Encounter (Signed)
Patient wants to know if he is due for blood work for his appointment on 05/17/16 with Dr. Richardson Landry?

## 2016-05-15 ENCOUNTER — Other Ambulatory Visit: Payer: Self-pay | Admitting: Family Medicine

## 2016-05-17 ENCOUNTER — Ambulatory Visit (INDEPENDENT_AMBULATORY_CARE_PROVIDER_SITE_OTHER): Payer: BC Managed Care – PPO | Admitting: Family Medicine

## 2016-05-17 ENCOUNTER — Encounter: Payer: Self-pay | Admitting: Family Medicine

## 2016-05-17 ENCOUNTER — Ambulatory Visit: Payer: BC Managed Care – PPO | Admitting: Family Medicine

## 2016-05-17 VITALS — BP 120/82 | Ht 74.0 in | Wt 324.0 lb

## 2016-05-17 DIAGNOSIS — E785 Hyperlipidemia, unspecified: Secondary | ICD-10-CM | POA: Diagnosis not present

## 2016-05-17 DIAGNOSIS — E119 Type 2 diabetes mellitus without complications: Secondary | ICD-10-CM | POA: Diagnosis not present

## 2016-05-17 DIAGNOSIS — I10 Essential (primary) hypertension: Secondary | ICD-10-CM | POA: Diagnosis not present

## 2016-05-17 DIAGNOSIS — Z79899 Other long term (current) drug therapy: Secondary | ICD-10-CM | POA: Diagnosis not present

## 2016-05-17 LAB — POCT GLYCOSYLATED HEMOGLOBIN (HGB A1C): Hemoglobin A1C: 6.8

## 2016-05-17 MED ORDER — METRONIDAZOLE 500 MG PO TABS: 500.0000 mg | ORAL_TABLET | Freq: Three times a day (TID) | ORAL | 0 refills | Status: DC

## 2016-05-17 NOTE — Progress Notes (Signed)
   Subjective:    Patient ID: Steven Crane, male    DOB: 02/13/1961, 56 y.o.   MRN: [id number]  Diabetes  He presents for his follow-up diabetic visit. He has type 2 diabetes mellitus. Risk factors for coronary artery disease include diabetes mellitus, dyslipidemia, hypertension, obesity and sedentary lifestyle. Current diabetic treatment includes insulin injections and oral agent (monotherapy). He is compliant with treatment all of the time. His weight is stable. He is following a diabetic diet. He has not had a previous visit with a dietitian. He does not see a podiatrist.Eye exam is not current.   Results for orders placed or performed in visit on 05/17/16  POCT glycosylated hemoglobin (Hb A1C)  Result Value Ref Range   Hemoglobin A1C 6.8     Patient with c/o stomach issues and gas . This started with a gastroenteritis pattern a couple weeks ago. Now residual diarrhea. Also left lower quadrant tenderness intermittently. Also at times sharp cramping pain no nocturnal  Blood pressure medicine and blood pressure levels reviewed today with patient. Compliant with blood pressure medicine. States does not miss a dose. No obvious side effects. Blood pressure generally good when checked elsewhere. Watching salt intake.  Patient continues to take lipid medication regularly. No obvious side effects from it. Generally does not miss a dose. Prior blood work results are reviewed with patient. Patient continues to work on fat intake in diet  Review of Systems No headache, no major weight loss or weight gain, no chest pain no back pain abdominal pain no change in bowel habits complete ROS otherwise negative     Objective:   Physical Exam  Alert vitals stable, NAD. Blood pressure good on repeat. HEENT normal. Lungs clear. Heart regular rate and rhythm. Left lower quadrant tenderness no rebound no guarding      Assessment & Plan:  Impression 1 colitis post gastroenteritis discussed with underlying  diabetes recommended trial of Flagyl along with probiotics discussed at length #2 type 2 diabetes control good discussed maintain same #3 hypertension good control discussed maintain same #4 hyperlipidemia status uncertain prior blood work reviewed plan diet exercise discussed. Medications prescribed as noted. Appropriate blood work further recommendations based results

## 2016-05-17 NOTE — Patient Instructions (Signed)
Ask your pharmacist for the least expensive probiotic and take it the next two weeks

## 2016-07-03 ENCOUNTER — Ambulatory Visit (INDEPENDENT_AMBULATORY_CARE_PROVIDER_SITE_OTHER): Payer: BC Managed Care – PPO | Admitting: Cardiovascular Disease

## 2016-07-03 ENCOUNTER — Encounter: Payer: Self-pay | Admitting: Cardiovascular Disease

## 2016-07-03 VITALS — BP 131/76 | HR 91 | Ht 74.0 in | Wt 321.0 lb

## 2016-07-03 DIAGNOSIS — E7800 Pure hypercholesterolemia, unspecified: Secondary | ICD-10-CM

## 2016-07-03 DIAGNOSIS — Z713 Dietary counseling and surveillance: Secondary | ICD-10-CM

## 2016-07-03 DIAGNOSIS — I25708 Atherosclerosis of coronary artery bypass graft(s), unspecified, with other forms of angina pectoris: Secondary | ICD-10-CM

## 2016-07-03 DIAGNOSIS — Z955 Presence of coronary angioplasty implant and graft: Secondary | ICD-10-CM | POA: Diagnosis not present

## 2016-07-03 DIAGNOSIS — I10 Essential (primary) hypertension: Secondary | ICD-10-CM | POA: Diagnosis not present

## 2016-07-03 NOTE — Patient Instructions (Signed)
Your physician wants you to follow-up in: 1 year with Dr. Bronson Ing.  You will receive a reminder letter in the mail two months in advance. If you don't receive a letter, please call our office to schedule the follow-up appointment.  Your physician recommends that you continue on your current medications as directed. Please refer to the Current Medication list given to you today.  If you need a refill on your cardiac medications before your next appointment, please call your pharmacy.  Thank you for choosing [facility]!

## 2016-07-03 NOTE — Progress Notes (Signed)
SUBJECTIVE: The patient presents for routine cardiovascular followup. He underwent drug-eluting stent placement to the proximal first obtuse marginal branch as well as the distal RCA in 08/2013.  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   He has not had any consistent exertional chest pain or dyspnea.  He eats a lot of fast food. He very seldom has palpitations. Has had occasional "shooting, sharp chest pains" lasting 1-2 minutes, but has never had to take nitroglycerin.   Review of Systems: As per "subjective", otherwise negative.  Allergies  Allergen Reactions  . Adhesive [Tape] Rash    Current Outpatient Prescriptions  Medication Sig Dispense Refill  . acetaminophen (TYLENOL) 500 MG tablet Take 1,000 mg by mouth 2 (two) times daily.    Marland Kitchen albuterol (PROVENTIL HFA;VENTOLIN HFA) 108 (90 BASE) MCG/ACT inhaler Inhale 2 puffs into the lungs every 6 (six) hours as needed for wheezing or shortness of breath. 1 Inhaler 2  . ALPRAZolam (XANAX) 0.5 MG tablet TAKE ONE-HALF TO ONE TABLET BY MOUTH ONCE DAILY AS NEEDED 30 tablet 5  . amLODipine (NORVASC) 5 MG tablet Take 1 tablet (5 mg total) by mouth daily. 90 tablet 1  . aspirin EC 81 MG tablet Take 81 mg by mouth every evening.    . enalapril (VASOTEC) 20 MG tablet Take 1 tablet (20 mg total) by mouth 2 (two) times daily. 60 tablet 5  . fenofibrate 160 MG tablet Take 1 tablet (160 mg total) by mouth daily. 30 tablet 5  . glyBURIDE-metformin (GLUCOVANCE) 5-500 MG tablet Take 2 tablets by mouth 2 (two) times daily. 120 tablet 5  . ibuprofen (ADVIL,MOTRIN) 800 MG tablet Take one up to bid 60 tablet 2  . insulin detemir (LEVEMIR) 100 UNIT/ML injection INJECT 62 UNITS SUBCUTANEOUSLY AT BEDTIME 20 mL 5  . levofloxacin (LEVAQUIN) 500 MG tablet Take 1 tablet (500 mg total) by mouth daily. 10 tablet 0  . metoprolol  succinate (TOPROL-XL) 50 MG 24 hr tablet Take 1 tablet (50 mg total) by mouth daily. 30 tablet 5  . metoprolol succinate (TOPROL-XL) 50 MG 24 hr tablet TAKE ONE TABLET BY MOUTH ONCE DAILY 90 tablet 0  . metroNIDAZOLE (FLAGYL) 500 MG tablet Take 1 tablet (500 mg total) by mouth 3 (three) times daily. 30 tablet 0  . nitroGLYCERIN (NITROSTAT) 0.4 MG SL tablet Place 1 tablet (0.4 mg total) under the tongue every 5 (five) minutes as needed for chest pain. If no relief call 911. 60 tablet 12  . omeprazole (PRILOSEC) 40 MG capsule Take 1 capsule (40 mg total) by mouth daily. 30 capsule 5  . pravastatin (PRAVACHOL) 40 MG tablet Take 1 tablet (40 mg total) by mouth daily. 30 tablet 5   No current facility-administered medications for this visit.     Past Medical History:  Diagnosis Date  . Arthritis    "hands, back, knees" (08/27/2013)  . Asthma   . CHF (congestive heart failure) ([place]) 02/2010  . Chronic bronchitis ([place])    "get it q year"  . Chronic lower back pain   . Closed head injury 1975  . Coronary artery disease May 2015   s/p successful PTCA/DES x 1 to distal RCA and PTCA/DES x 1 to OM-20 Aug 2013  . DVT (deep venous thrombosis) ([place]) 2008   "LLE"  . GERD (gastroesophageal reflux disease)   .  Headache(784.0)    "comes and goes; sometimes qd; sometimes weekly" (08/27/2013)  . Hyperlipemia   . Hypertension   . Hypertriglyceridemia   . LV dysfunction    LVEF 40% at time of cardiac cath May 2015  . Morbid obesity ([place])   . Myocardial infarction 02/2010  . Psoriasis   . Type II diabetes mellitus ([place])     Past Surgical History:  Procedure Laterality Date  . COLONOSCOPY  01/06/2012   Procedure: COLONOSCOPY;  Surgeon: [name], MD;  Location: [facility];  Service: Endoscopy;  Laterality: N/A;  7:30 AM  . CORONARY ANGIOPLASTY WITH STENT PLACEMENT  02/2010; 08/27/2013   "1 + 3"   . KNEE ARTHROSCOPY Left 1981  . LEFT HEART CATHETERIZATION WITH CORONARY ANGIOGRAM N/A 08/27/2013    Procedure: LEFT HEART CATHETERIZATION WITH CORONARY ANGIOGRAM;  Surgeon: [name], MD;  Location: [facility] CATH LAB;  Service: Cardiovascular;  Laterality: N/A;    Social History   Social History  . Marital status: Single    Spouse name: N/A  . Number of children: N/A  . Years of education: N/A   Occupational History  . Not on file.   Social History Main Topics  . Smoking status: Never Smoker  . Smokeless tobacco: Current User    Types: Snuff, Chew     Comment: "started dipping @ age 21; quit for 5 1/2 yrs at one time; hadn't chewed in awhile"  . Alcohol use 0.0 oz/week     Comment: 08/27/2013 "drink beer and liquor; don't drink much"  . Drug use: No  . Sexual activity: Yes   Other Topics Concern  . Not on file   Social History Narrative  . No narrative on file     Vitals:   07/03/16 1332  BP: 131/76  Pulse: 91  SpO2: 96%  Weight: (!) 321 lb (145.6 kg)  Height: 6\' 2"  (1.88 m)    PHYSICAL EXAM General: NAD, morbidly obese  Neck: No JVD, no thyromegaly.  Lungs: Clear to auscultation bilaterally with normal respiratory effort.  CV: Nondisplaced PMI. Regular rate and rhythm, normal S1/S2, no S3/S4, no murmur. No pretibial or periankle edema.  Abdomen: Soft, obese, no distention.  Neurologic: Alert and oriented x 3.  Psych: Normal affect.  Extremities: No clubbing or cyanosis.  Skin: Normal. Musculoskeletal: Grossly normal. Extremities: No clubbing or cyanosis.     ECG: Most recent ECG reviewed.      ASSESSMENT AND PLAN: 1. CAD s/p DES x 2 to proximal OM1 and distal RCA in 08/2013: Stable ischemic heart disease. Continue aspirin, metoprolol, and pravastatin.   2. Essential HTN: Controlled. No changes.  3. Hyperlipidemia: Lipids on 02/14/16 showed total cholesterol 145, triglycerides 167, HDL 33, LDL 79. Continue pravastatin 40 mg daily.  4. Morbid obesity: Weight loss encouraged. Dietary counseling provided.  Dispo: f/u 1  year.   Kate Sable, M.D., F.A.C.C.

## 2016-07-05 ENCOUNTER — Ambulatory Visit: Payer: BC Managed Care – PPO | Admitting: Cardiovascular Disease

## 2016-08-20 ENCOUNTER — Telehealth: Payer: Self-pay | Admitting: Family Medicine

## 2016-08-20 DIAGNOSIS — E785 Hyperlipidemia, unspecified: Secondary | ICD-10-CM

## 2016-08-20 DIAGNOSIS — E119 Type 2 diabetes mellitus without complications: Secondary | ICD-10-CM

## 2016-08-20 NOTE — Telephone Encounter (Signed)
Spoke with patient and informed him per Dr.Steve Luking- we have ordered blood work for upcoming appointment. Patient verbalized understanding.

## 2016-08-20 NOTE — Telephone Encounter (Signed)
Patient has an appointment on 08/23/16 with Dr. Richardson Landry.  He wants to know if he is due for blood work.

## 2016-08-20 NOTE — Telephone Encounter (Signed)
Lip liv A1c 

## 2016-08-23 ENCOUNTER — Ambulatory Visit: Payer: BC Managed Care – PPO | Admitting: Family Medicine

## 2016-08-24 LAB — HEPATIC FUNCTION PANEL
ALK PHOS: 44 IU/L (ref 39–117)
ALT: 46 IU/L — ABNORMAL HIGH (ref 0–44)
AST: 30 IU/L (ref 0–40)
Albumin: 3.8 g/dL (ref 3.5–5.5)
Bilirubin Total: 0.3 mg/dL (ref 0.0–1.2)
Bilirubin, Direct: 0.08 mg/dL (ref 0.00–0.40)
TOTAL PROTEIN: 6.5 g/dL (ref 6.0–8.5)

## 2016-08-24 LAB — LIPID PANEL
Chol/HDL Ratio: 4.6 ratio (ref 0.0–5.0)
Cholesterol, Total: 133 mg/dL (ref 100–199)
HDL: 29 mg/dL — ABNORMAL LOW (ref >39)
LDL Calculated: 64 mg/dL (ref 0–99)
Triglycerides: 200 mg/dL — ABNORMAL HIGH (ref 0–149)
VLDL Cholesterol Cal: 40 mg/dL (ref 5–40)

## 2016-08-24 LAB — HEMOGLOBIN A1C
Est. average glucose Bld gHb Est-mCnc: 235 mg/dL
Hgb A1c MFr Bld: 9.8 % — ABNORMAL HIGH (ref 4.8–5.6)

## 2016-09-01 ENCOUNTER — Other Ambulatory Visit: Payer: Self-pay | Admitting: Family Medicine

## 2016-09-03 ENCOUNTER — Other Ambulatory Visit: Payer: Self-pay | Admitting: Family Medicine

## 2016-09-06 ENCOUNTER — Ambulatory Visit (INDEPENDENT_AMBULATORY_CARE_PROVIDER_SITE_OTHER): Payer: BC Managed Care – PPO | Admitting: Family Medicine

## 2016-09-06 ENCOUNTER — Encounter: Payer: Self-pay | Admitting: Family Medicine

## 2016-09-06 VITALS — BP 136/80 | Temp 98.7°F | Ht 74.0 in | Wt 328.0 lb

## 2016-09-06 DIAGNOSIS — B9689 Other specified bacterial agents as the cause of diseases classified elsewhere: Secondary | ICD-10-CM

## 2016-09-06 DIAGNOSIS — J019 Acute sinusitis, unspecified: Secondary | ICD-10-CM

## 2016-09-06 DIAGNOSIS — I10 Essential (primary) hypertension: Secondary | ICD-10-CM | POA: Diagnosis not present

## 2016-09-06 DIAGNOSIS — E119 Type 2 diabetes mellitus without complications: Secondary | ICD-10-CM | POA: Diagnosis not present

## 2016-09-06 MED ORDER — PRAVASTATIN SODIUM 40 MG PO TABS: 40.0000 mg | ORAL_TABLET | Freq: Every day | ORAL | 1 refills | Status: DC

## 2016-09-06 MED ORDER — METOPROLOL SUCCINATE ER 50 MG PO TB24: 50.0000 mg | ORAL_TABLET | Freq: Every day | ORAL | 1 refills | Status: DC

## 2016-09-06 MED ORDER — AMOXICILLIN-POT CLAVULANATE 875-125 MG PO TABS: 1.0000 | ORAL_TABLET | Freq: Two times a day (BID) | ORAL | 0 refills | Status: DC

## 2016-09-06 MED ORDER — FENOFIBRATE 160 MG PO TABS: 160.0000 mg | ORAL_TABLET | Freq: Every day | ORAL | 1 refills | Status: DC

## 2016-09-06 MED ORDER — AMLODIPINE BESYLATE 5 MG PO TABS: 5.0000 mg | ORAL_TABLET | Freq: Every day | ORAL | 1 refills | Status: DC

## 2016-09-06 MED ORDER — INSULIN DETEMIR 100 UNIT/ML ~~LOC~~ SOLN: SUBCUTANEOUS | 1 refills | Status: DC

## 2016-09-06 MED ORDER — NITROGLYCERIN 0.4 MG SL SUBL: 0.4000 mg | SUBLINGUAL_TABLET | SUBLINGUAL | 5 refills | Status: DC | PRN

## 2016-09-06 MED ORDER — OMEPRAZOLE 40 MG PO CPDR: 40.0000 mg | DELAYED_RELEASE_CAPSULE | Freq: Every day | ORAL | 1 refills | Status: DC

## 2016-09-06 MED ORDER — GLYBURIDE-METFORMIN 5-500 MG PO TABS: 2.0000 | ORAL_TABLET | Freq: Two times a day (BID) | ORAL | 1 refills | Status: DC

## 2016-09-06 MED ORDER — ENALAPRIL MALEATE 20 MG PO TABS: 20.0000 mg | ORAL_TABLET | Freq: Two times a day (BID) | ORAL | 1 refills | Status: DC

## 2016-09-06 NOTE — Progress Notes (Signed)
   Subjective:    Patient ID: Steven Crane, male    DOB: [birth year], 56 y.o.   MRN: [id number]  Diabetes  He presents for his follow-up diabetic visit. He has type 2 diabetes mellitus. He is compliant with treatment all of the time. Home blood sugar record trend: does not check blood sugar. Eye exam is current.   A1C on bloodwork 9.8.   Runny nose, ears popping, sore throat. Started a few days ago. Girl friedn had for over a week. Some frontal headache  Results for orders placed or performed in visit on 08/20/16  Lipid panel  Result Value Ref Range   Cholesterol, Total 133 100 - 199 mg/dL   Triglycerides 200 (H) 0 - 149 mg/dL   HDL 29 (L) >39 mg/dL   VLDL Cholesterol Cal 40 5 - 40 mg/dL   LDL Calculated 64 0 - 99 mg/dL   Chol/HDL Ratio 4.6 0.0 - 5.0 ratio  Hepatic function panel  Result Value Ref Range   Total Protein 6.5 6.0 - 8.5 g/dL   Albumin 3.8 3.5 - 5.5 g/dL   Bilirubin Total 0.3 0.0 - 1.2 mg/dL   Bilirubin, Direct 0.08 0.00 - 0.40 mg/dL   Alkaline Phosphatase 44 39 - 117 IU/L   AST 30 0 - 40 IU/L   ALT 46 (H) 0 - 44 IU/L  Hemoglobin A1C  Result Value Ref Range   Hgb A1c MFr Bld 9.8 (H) 4.8 - 5.6 %   Est. average glucose Bld gHb Est-mCnc 235 mg/dL   Blood pressure medicine and blood pressure levels reviewed today with patient. Compliant with blood pressure medicine. States does not miss a dose. No obvious side effects. Blood pressure generally good when checked elsewhere. Watching salt intake.   Patient continues to take lipid medication regularly. No obvious side effects from it. Generally does not miss a dose. Prior blood work results are reviewed with patient. Patient continues to work on fat intake in diet  Patient claims compliance with diabetes medication. No obvious side effects. Reports no substantial low sugar spells. Most numbers are generally in good range when checked fasting. Generally does not miss a dose of medication. Watching diabetic diet  closely   Far amnt of walking at work   Review of Systems No headache, no major weight loss or weight gain, no chest pain no back pain abdominal pain no change in bowel habits complete ROS otherwise negative     Objective:   Physical Exam substantial obesity present unfortunately positive frontal nasal congestion plus minus frontal tenderness Alert and oriented, vitals reviewed and stable, NAD ENT-TM's and ext canals WNL bilat via otoscopic exam Soft palate, tonsils and post pharynx WNL via oropharyngeal exam Neck-symmetric, no masses; thyroid nonpalpable and nontender Pulmonary-no tachypnea or accessory muscle use; Clear without wheezes via auscultation Card--no abnrml murmurs, rhythm reg and rate WNL Carotid pulses symmetric, without bruits        Assessment & Plan:  Impression 1 acute rhinosinusitis #2 type 2 diabetes A1c suboptimum discuss with need to titrate insulin regimen discussed #3 hypertension good control discussed maintain same medicine for hyperlipidemia relatively good control discussed maintain same meds diet discussed compliance discussed meds refilled. Augmentin twice a day 10 days. Follow-up in several months [facility]

## 2016-09-06 NOTE — Patient Instructions (Signed)
incr insulin to 68 each morn  If sugars in ten days not down to 130 or better mostly, add six more units  If sugars not down to goal ten days after that, add six more

## 2016-09-09 ENCOUNTER — Other Ambulatory Visit: Payer: Self-pay | Admitting: *Deleted

## 2016-09-09 MED ORDER — NITROGLYCERIN 0.4 MG SL SUBL: 0.4000 mg | SUBLINGUAL_TABLET | SUBLINGUAL | 5 refills | Status: DC | PRN

## 2016-11-11 ENCOUNTER — Ambulatory Visit (INDEPENDENT_AMBULATORY_CARE_PROVIDER_SITE_OTHER): Payer: BC Managed Care – PPO | Admitting: Family Medicine

## 2016-11-11 ENCOUNTER — Encounter: Payer: Self-pay | Admitting: Family Medicine

## 2016-11-11 VITALS — BP 130/82 | Ht 74.0 in | Wt 322.6 lb

## 2016-11-11 DIAGNOSIS — G459 Transient cerebral ischemic attack, unspecified: Secondary | ICD-10-CM | POA: Diagnosis not present

## 2016-11-11 MED ORDER — CLOPIDOGREL BISULFATE 75 MG PO TABS: 75.0000 mg | ORAL_TABLET | Freq: Every day | ORAL | 3 refills | Status: DC

## 2016-11-11 NOTE — Progress Notes (Signed)
   Subjective:    Patient ID: Steven Crane, male    DOB: 1960/10/25, 56 y.o.   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 Her is no obvious weakness on thatsie but the patient did notice difficulty speaking during that same ti    Review of Systems  Constitutional: Negative for activity change, appetite change and fever.  HENT: Negative for congestion and rhinorrhea.   Eyes: Negative for discharge.  Respiratory: Negative for cough and wheezing.   Cardiovascular: Negative for chest pain.  Gastrointestinal: Negative for abdominal pain, blood in stool and vomiting.  Genitourinary: Negative for difficulty urinating and frequency.  Musculoskeletal: Negative for neck pain.  Skin: Negative for rash.  Allergic/Immunologic: Negative for environmental allergies and food allergies.  Neurological: Negative for weakness and headaches.  Psychiatric/Behavioral: Negative for agitation.  All other systems reviewed and are negative.      Objective:   Physical Exam  Alert and oriented, vitals reviewed and stable, NAD ENT-TM's and ext canals WNL bilat via otoscopic exam Soft palate, tonsils and post pharynx WNL via oropharyngeal exam Neck-symmetric, no masses; thyroid nonpalpable and nontender Pulmonary-no tachypnea or accessory muscle use; Clear without wheezes via auscultation Card--no abnrml murmurs, rhythm reg and rate WNL Carotid pulses symmetric, without bruits  neurological exam currently stble      Assessment & Plan:   impression probable TIA discused. o residualno residual symptoms. Should at least  screen carotid rationale discussed withpatient Stop aspirin. Start Plavx. Rationale discussed. Furher recommendations based on scan result

## 2016-11-14 ENCOUNTER — Ambulatory Visit (HOSPITAL_COMMUNITY)
Admission: RE | Admit: 2016-11-14 | Discharge: 2016-11-14 | Disposition: A | Discharge status: Home / Self Care | Payer: BC Managed Care – PPO | Source: Ambulatory Visit | Attending: Family Medicine | Admitting: Family Medicine

## 2016-11-14 DIAGNOSIS — G459 Transient cerebral ischemic attack, unspecified: Secondary | ICD-10-CM | POA: Insufficient documentation

## 2016-11-20 ENCOUNTER — Other Ambulatory Visit: Payer: Self-pay | Admitting: *Deleted

## 2016-11-20 DIAGNOSIS — E119 Type 2 diabetes mellitus without complications: Secondary | ICD-10-CM

## 2016-11-20 DIAGNOSIS — Z79899 Other long term (current) drug therapy: Secondary | ICD-10-CM

## 2016-11-20 DIAGNOSIS — I10 Essential (primary) hypertension: Secondary | ICD-10-CM

## 2016-12-04 ENCOUNTER — Other Ambulatory Visit: Payer: Self-pay | Admitting: Family Medicine

## 2016-12-06 ENCOUNTER — Ambulatory Visit: Payer: BC Managed Care – PPO | Admitting: Family Medicine

## 2016-12-17 LAB — HEPATIC FUNCTION PANEL
ALT: 50 IU/L — ABNORMAL HIGH (ref 0–44)
AST: 29 IU/L (ref 0–40)
Albumin: 4.1 g/dL (ref 3.5–5.5)
Alkaline Phosphatase: 46 IU/L (ref 39–117)
Bilirubin, Direct: 0.06 mg/dL (ref 0.00–0.40)
Total Protein: 6.8 g/dL (ref 6.0–8.5)

## 2016-12-17 LAB — BASIC METABOLIC PANEL
BUN/Creatinine Ratio: 18 (ref 9–20)
BUN: 14 mg/dL (ref 6–24)
CO2: 23 mmol/L (ref 20–29)
CREATININE: 0.76 mg/dL (ref 0.76–1.27)
Calcium: 8.9 mg/dL (ref 8.7–10.2)
Chloride: 99 mmol/L (ref 96–106)
GFR calc Af Amer: 118 mL/min/{1.73_m2} (ref >59)
GFR calc non Af Amer: 102 mL/min/{1.73_m2} (ref >59)
GLUCOSE: 213 mg/dL — AB (ref 65–99)
Potassium: 5 mmol/L (ref 3.5–5.2)
Sodium: 135 mmol/L (ref 134–144)

## 2016-12-17 LAB — HEMOGLOBIN A1C
Est. average glucose Bld gHb Est-mCnc: 232 mg/dL
Hgb A1c MFr Bld: 9.7 % — ABNORMAL HIGH (ref 4.8–5.6)

## 2016-12-20 ENCOUNTER — Encounter: Payer: Self-pay | Admitting: Family Medicine

## 2016-12-20 ENCOUNTER — Ambulatory Visit (INDEPENDENT_AMBULATORY_CARE_PROVIDER_SITE_OTHER): Payer: BC Managed Care – PPO | Admitting: Family Medicine

## 2016-12-20 VITALS — BP 146/80 | Ht 74.0 in | Wt 328.0 lb

## 2016-12-20 DIAGNOSIS — R21 Rash and other nonspecific skin eruption: Secondary | ICD-10-CM

## 2016-12-20 DIAGNOSIS — I10 Essential (primary) hypertension: Secondary | ICD-10-CM

## 2016-12-20 DIAGNOSIS — E119 Type 2 diabetes mellitus without complications: Secondary | ICD-10-CM | POA: Diagnosis not present

## 2016-12-20 DIAGNOSIS — E785 Hyperlipidemia, unspecified: Secondary | ICD-10-CM

## 2016-12-20 MED ORDER — DOXYCYCLINE HYCLATE 100 MG PO TABS: 100.0000 mg | ORAL_TABLET | Freq: Two times a day (BID) | ORAL | 0 refills | Status: DC

## 2016-12-20 MED ORDER — INSULIN DETEMIR 100 UNIT/ML ~~LOC~~ SOLN: SUBCUTANEOUS | 0 refills | Status: DC

## 2016-12-20 NOTE — Progress Notes (Signed)
Subjective:    Patient ID: Steven Crane, male    DOB: 1960/06/13, 56 y.o.   MRN: [id number]  Diabetes  He presents for his follow-up diabetic visit. He has type 2 diabetes mellitus. Compliance with diabetes treatment: ocassionaly forgets meds. Home blood sugar record trend: has not been checking blood sugar. He does not see a podiatrist.Eye exam is current (due again in september).   A1C done on bloodwork 4 days ago 9.7.  Concerns about vein under left arm. noticied it 10 days ago.   Results for orders placed or performed in visit on 11/20/16  Hepatic function panel  Result Value Ref Range   Total Protein 6.8 6.0 - 8.5 g/dL   Albumin 4.1 3.5 - 5.5 g/dL   Bilirubin Total <0.2 0.0 - 1.2 mg/dL   Bilirubin, Direct 0.06 0.00 - 0.40 mg/dL   Alkaline Phosphatase 46 39 - 117 IU/L   AST 29 0 - 40 IU/L   ALT 50 (H) 0 - 44 IU/L  Hemoglobin A1c  Result Value Ref Range   Hgb A1c MFr Bld 9.7 (H) 4.8 - 5.6 %   Est. average glucose Bld gHb Est-mCnc 232 mg/dL  Basic metabolic panel  Result Value Ref Range   Glucose 213 (H) 65 - 99 mg/dL   BUN 14 6 - 24 mg/dL   Creatinine, Ser 0.76 0.76 - 1.27 mg/dL   GFR calc non Af Amer 102 >59 mL/min/1.73   GFR calc Af Amer 118 >59 mL/min/1.73   BUN/Creatinine Ratio 18 9 - 20   Sodium 135 134 - 144 mmol/L   Potassium 5.0 3.5 - 5.2 mmol/L   Chloride 99 96 - 106 mmol/L   CO2 23 20 - 29 mmol/L   Calcium 8.9 8.7 - 10.2 mg/dL   Blood pressure medicine and blood pressure levels reviewed today with patient. Compliant with blood pressure medicine. States does not miss a dose. No obvious side effects. Blood pressure generally good when checked elsewhere. Watching salt intake.   Patient continues to take lipid medication regularly. No obvious side effects from it. Generally does not miss a dose. Prior blood work results are reviewed with patient. Patient continues to work on fat intake in diet  Patient claims compliance with diabetes medication. No obvious side  effects. Reports no substantial low sugar spells. Most numbers are generally in good range when checked fasting. Generally does not miss a dose of medication. Watching diabetic diet closely     Review of Systems No headache, no major weight loss or weight gain, no chest pain no back pain abdominal pain no change in bowel habits complete ROS otherwise negative     Objective:   Physical Exam  Alert and oriented, vitals reviewed and stable, NAD ENT-TM's and ext canals WNL bilat via otoscopic exam Soft palate, tonsils and post pharynx WNL via oropharyngeal exam Neck-symmetric, no masses; thyroid nonpalpable and nontender Pulmonary-no tachypnea or accessory muscle use; Clear without wheezes via auscultation Card--no abnrml murmurs, rhythm reg and rate WNL Carotid pulses symmetric, without bruits Several infected cyst left axillary region with elements of lymphangitis      Assessment & Plan:  Impression 1 infected cyst with secondary lymphangitis discussed  #2 type 2 diabetes suboptimum control compliance of diet poor. Not increase insulin is requested. Not checking sugars his request. Long conversation held.  #3 hypertension decent control discussed maintain same therapy  4 hyperlipidemia discussed compliance discussed prior blood work discussed.  Blood work discussed diet exercise discuss antibiotics  prescribed. Will start on insulin titration see plan

## 2016-12-20 NOTE — Patient Instructions (Signed)
Check your sugar EVERY morning  Until most of your morning sugars are under 140, increase your insulin by six units every three days  Work harder on your diet  I f you hit 100 units in a few weeks,call us

## 2017-01-28 ENCOUNTER — Ambulatory Visit (INDEPENDENT_AMBULATORY_CARE_PROVIDER_SITE_OTHER): Payer: BC Managed Care – PPO | Admitting: Family Medicine

## 2017-01-28 ENCOUNTER — Encounter: Payer: Self-pay | Admitting: Family Medicine

## 2017-01-28 VITALS — BP 150/80 | Temp 98.2°F | Ht 74.0 in | Wt 325.0 lb

## 2017-01-28 DIAGNOSIS — J019 Acute sinusitis, unspecified: Secondary | ICD-10-CM | POA: Diagnosis not present

## 2017-01-28 MED ORDER — AMOXICILLIN-POT CLAVULANATE 875-125 MG PO TABS: 1.0000 | ORAL_TABLET | Freq: Two times a day (BID) | ORAL | 0 refills | Status: DC

## 2017-01-28 MED ORDER — ALBUTEROL SULFATE HFA 108 (90 BASE) MCG/ACT IN AERS: 2.0000 | INHALATION_SPRAY | Freq: Four times a day (QID) | RESPIRATORY_TRACT | 2 refills | Status: DC | PRN

## 2017-01-28 NOTE — Progress Notes (Signed)
   Subjective:    Patient ID: Steven Crane, male    DOB: 27-Aug-1960, 56 y.o.   MRN: [id number]  Sinusitis  This is a new problem. Episode onset: 4 days. Associated symptoms include congestion, coughing, ear pain and headaches. (Wheezing) Treatments tried: cough drops.   Patient started 5 days ago with head congestion drainage coughing now having some tightness in the bronchioles denies angina symptoms relate some sinus pressure relates postnasal drainage and moderate amount of coughing no shortness of breath but is having some fatigue and sweats   Review of Systems  Constitutional: Negative for activity change and fever.  HENT: Positive for congestion, ear pain and rhinorrhea.   Eyes: Negative for discharge.  Respiratory: Positive for cough. Negative for wheezing.   Cardiovascular: Negative for chest pain.  Neurological: Positive for headaches.       Objective:   Physical Exam  Constitutional: He appears well-developed.  HENT:  Head: Normocephalic.  Mouth/Throat: Oropharynx is clear and moist. No oropharyngeal exudate.  Neck: Normal range of motion.  Cardiovascular: Normal rate, regular rhythm and normal heart sounds.   No murmur heard. Pulmonary/Chest: Effort normal and breath sounds normal. He has no wheezes.  Lymphadenopathy:    He has no cervical adenopathy.  Neurological: He exhibits normal muscle tone.  Skin: Skin is warm and dry.  Nursing note and vitals reviewed.         Assessment & Plan:  Viral syndrome Secondary rhinosinusitis Bronchial component Reactive airway by history Albuterol when necessary will help Augmentin 875 mg twice a day for the next 10 days If progressively worse or other problems recommend ER or here for follow-up

## 2017-01-29 ENCOUNTER — Other Ambulatory Visit: Payer: Self-pay | Admitting: Family Medicine

## 2017-03-07 ENCOUNTER — Other Ambulatory Visit: Payer: Self-pay | Admitting: Family Medicine

## 2017-03-21 ENCOUNTER — Encounter: Payer: Self-pay | Admitting: Family Medicine

## 2017-03-21 ENCOUNTER — Ambulatory Visit: Payer: BC Managed Care – PPO | Admitting: Family Medicine

## 2017-03-21 VITALS — BP 132/80 | Ht 74.0 in | Wt 321.0 lb

## 2017-03-21 DIAGNOSIS — B9689 Other specified bacterial agents as the cause of diseases classified elsewhere: Secondary | ICD-10-CM

## 2017-03-21 DIAGNOSIS — Z125 Encounter for screening for malignant neoplasm of prostate: Secondary | ICD-10-CM | POA: Diagnosis not present

## 2017-03-21 DIAGNOSIS — I10 Essential (primary) hypertension: Secondary | ICD-10-CM

## 2017-03-21 DIAGNOSIS — E785 Hyperlipidemia, unspecified: Secondary | ICD-10-CM | POA: Diagnosis not present

## 2017-03-21 DIAGNOSIS — Z79899 Other long term (current) drug therapy: Secondary | ICD-10-CM

## 2017-03-21 DIAGNOSIS — E119 Type 2 diabetes mellitus without complications: Secondary | ICD-10-CM | POA: Diagnosis not present

## 2017-03-21 DIAGNOSIS — J019 Acute sinusitis, unspecified: Secondary | ICD-10-CM

## 2017-03-21 DIAGNOSIS — Z13220 Encounter for screening for lipoid disorders: Secondary | ICD-10-CM | POA: Diagnosis not present

## 2017-03-21 LAB — POCT GLYCOSYLATED HEMOGLOBIN (HGB A1C): Hemoglobin A1C: 8.3

## 2017-03-21 MED ORDER — ALPRAZOLAM 0.5 MG PO TABS: ORAL_TABLET | ORAL | 5 refills | Status: DC

## 2017-03-21 MED ORDER — PRAVASTATIN SODIUM 40 MG PO TABS: 40.0000 mg | ORAL_TABLET | Freq: Every day | ORAL | 1 refills | Status: DC

## 2017-03-21 MED ORDER — CLOPIDOGREL BISULFATE 75 MG PO TABS: 75.0000 mg | ORAL_TABLET | Freq: Every day | ORAL | 1 refills | Status: DC

## 2017-03-21 MED ORDER — OMEPRAZOLE 40 MG PO CPDR: 40.0000 mg | DELAYED_RELEASE_CAPSULE | Freq: Every day | ORAL | 1 refills | Status: DC

## 2017-03-21 MED ORDER — GLYBURIDE-METFORMIN 5-500 MG PO TABS: 2.0000 | ORAL_TABLET | Freq: Two times a day (BID) | ORAL | 5 refills | Status: DC

## 2017-03-21 MED ORDER — METOPROLOL SUCCINATE ER 50 MG PO TB24: 50.0000 mg | ORAL_TABLET | Freq: Every day | ORAL | 1 refills | Status: DC

## 2017-03-21 MED ORDER — INSULIN DETEMIR 100 UNIT/ML ~~LOC~~ SOLN: SUBCUTANEOUS | 5 refills | Status: DC

## 2017-03-21 MED ORDER — ENALAPRIL MALEATE 20 MG PO TABS: 20.0000 mg | ORAL_TABLET | Freq: Two times a day (BID) | ORAL | 1 refills | Status: DC

## 2017-03-21 MED ORDER — CEFDINIR 300 MG PO CAPS: 300.0000 mg | ORAL_CAPSULE | Freq: Two times a day (BID) | ORAL | 0 refills | Status: DC

## 2017-03-21 MED ORDER — AMLODIPINE BESYLATE 5 MG PO TABS: 5.0000 mg | ORAL_TABLET | Freq: Every day | ORAL | 1 refills | Status: DC

## 2017-03-21 NOTE — Progress Notes (Signed)
   Subjective:    Patient ID: Steven Crane, male    DOB: 03-26-1961, 56 y.o.   MRN: [id number] Patient arrives with numerous concerns HPI Patient is here today to follow up on DM, he is currently taking Glyburide 5-500 2 tablets po BID,also takes Levemir 80-90u QHS. He states he eats not healthy just what he can get.He does not  Exercise. He has had flu shot and it has been documented.  Blood pressure medicine and blood pressure levels reviewed today with patient. Compliant with blood pressure medicine. States does not miss a dose. No obvious side effects. Blood pressure generally good when checked elsewhere. Watching salt intake.   Patient claims compliance with diabetes medication. No obvious side effects. Reports no substantial low sugar spells. Most numbers are generally in good range when checked fasting. Generally does not miss a dose of medication. Watching diabetic diet closely  Patient continues to take lipid medication regularly. No obvious side effects from it. Generally does not miss a dose. Prior blood work results are reviewed with patient. Patient continues to work on fat intake in diet   Taking levemir from 84 to 39  Pos cpng and coug and gunky and frontal headache  Review of Systems     Results for orders placed or performed in visit on 03/21/17  POCT glycosylated hemoglobin (Hb A1C)  Result Value Ref Range   Hemoglobin A1C 8.3     Objective:   Physical Exam Alert and oriented, vitals reviewed and stable, NAD ENT-TM's and ext canals WNL bilat via otoscopic exam Soft palate, tonsils and post pharynx WNL via oropharyngeal exam Neck-symmetric, no masses; thyroid nonpalpable and nontender Pulmonary-no tachypnea or accessory muscle use; Clear without wheezes via auscultation Card--no abnrml murmurs, rhythm reg and rate WNL Carotid pulses symmetric, without bruits        Assessment & Plan:  Impression 1 type 2 diabetes control still suboptimal but improving.   Patient just increased insulin to 90 units within the last week will maintain  2.  Hypertension good control discussed to maintain same meds  3.  Rhinosinusitis discussed we will add antibiotics  4.  Hyperlipidemia.  Prior blood work results reviewed discussed  Follow-up in several months.  Appropriate blood work.  Wellness exam also in several months along with chronic

## 2017-03-23 LAB — HEPATIC FUNCTION PANEL
ALT: 46 IU/L — AB (ref 0–44)
AST: 26 IU/L (ref 0–40)
Albumin: 4.2 g/dL (ref 3.5–5.5)
Alkaline Phosphatase: 46 IU/L (ref 39–117)
BILIRUBIN TOTAL: 0.3 mg/dL (ref 0.0–1.2)
BILIRUBIN, DIRECT: 0.11 mg/dL (ref 0.00–0.40)
TOTAL PROTEIN: 6.9 g/dL (ref 6.0–8.5)

## 2017-03-23 LAB — BASIC METABOLIC PANEL
BUN/Creatinine Ratio: 12 (ref 9–20)
BUN: 8 mg/dL (ref 6–24)
CALCIUM: 8.8 mg/dL (ref 8.7–10.2)
CHLORIDE: 101 mmol/L (ref 96–106)
CO2: 23 mmol/L (ref 20–29)
CREATININE: 0.65 mg/dL — AB (ref 0.76–1.27)
GFR calc non Af Amer: 109 mL/min/{1.73_m2} (ref >59)
GFR, EST AFRICAN AMERICAN: 126 mL/min/{1.73_m2} (ref >59)
Glucose: 194 mg/dL — ABNORMAL HIGH (ref 65–99)
Potassium: 4.8 mmol/L (ref 3.5–5.2)
Sodium: 138 mmol/L (ref 134–144)

## 2017-03-23 LAB — LIPID PANEL
CHOL/HDL RATIO: 4.2 ratio (ref 0.0–5.0)
Cholesterol, Total: 142 mg/dL (ref 100–199)
HDL: 34 mg/dL — ABNORMAL LOW (ref >39)
LDL CALC: 76 mg/dL (ref 0–99)
TRIGLYCERIDES: 161 mg/dL — AB (ref 0–149)
VLDL Cholesterol Cal: 32 mg/dL (ref 5–40)

## 2017-03-23 LAB — MICROALBUMIN / CREATININE URINE RATIO
Creatinine, Urine: 105.3 mg/dL
MICROALB/CREAT RATIO: 2.9 mg/g{creat} (ref 0.0–30.0)
MICROALBUM., U, RANDOM: 3.1 ug/mL

## 2017-03-23 LAB — PSA: PROSTATE SPECIFIC AG, SERUM: 0.2 ng/mL (ref 0.0–4.0)

## 2017-05-12 ENCOUNTER — Encounter: Payer: Self-pay | Admitting: Family Medicine

## 2017-05-12 ENCOUNTER — Ambulatory Visit: Payer: BC Managed Care – PPO | Admitting: Family Medicine

## 2017-05-12 VITALS — BP 130/82 | Temp 98.9°F | Ht 74.0 in | Wt 326.0 lb

## 2017-05-12 DIAGNOSIS — L723 Sebaceous cyst: Secondary | ICD-10-CM

## 2017-05-12 DIAGNOSIS — L089 Local infection of the skin and subcutaneous tissue, unspecified: Secondary | ICD-10-CM

## 2017-05-12 MED ORDER — DOXYCYCLINE HYCLATE 100 MG PO TABS: 100.0000 mg | ORAL_TABLET | Freq: Two times a day (BID) | ORAL | 0 refills | Status: DC

## 2017-05-12 NOTE — Progress Notes (Signed)
   Subjective:    Patient ID: Steven Crane, male    DOB: 09-Aug-1960, 57 y.o.   MRN: [id number]  HPICyst on back of neck. Getting bigger. Came up a couple months ago.  Further history probably has had the cyst for a long long time positive history of infected cyst in the past.  This is located posterior cervical region at the base of the skull.  Occasionally has expressed discharge in the past.  Last 10 days progressive pain.  Temperature is warm.  No obvious discharge.  Affected sleep last night      Review of Systems No fevers no vomiting no diarrhea  Objective:   Physical Exam  PositiveAlert vitals stable, NAD. Blood pressure good on repeat. HEENT normal. Lungs clear. Heart regular rate and rhythm. Sebaceous cyst no true fluctuance, pos inflammed      Assessment & Plan:  Impression this.  Long discussion held.  Nature condition discussed.abx rxed, local measures dis, pt wishes to hld off on surgeon  Greater than 50% of this 15 minute face to face visit was spent in counseling and discussion and coordination of care regarding the above diagnosis/diagnosies

## 2017-05-29 LAB — HM DIABETES EYE EXAM

## 2017-06-20 ENCOUNTER — Encounter: Payer: Self-pay | Admitting: Family Medicine

## 2017-06-20 ENCOUNTER — Ambulatory Visit: Payer: BC Managed Care – PPO | Admitting: Family Medicine

## 2017-06-20 ENCOUNTER — Encounter: Payer: BC Managed Care – PPO | Admitting: Family Medicine

## 2017-06-20 VITALS — BP 124/82 | Ht 73.0 in | Wt 315.0 lb

## 2017-06-20 DIAGNOSIS — E119 Type 2 diabetes mellitus without complications: Secondary | ICD-10-CM

## 2017-06-20 DIAGNOSIS — Z0001 Encounter for general adult medical examination with abnormal findings: Secondary | ICD-10-CM | POA: Diagnosis not present

## 2017-06-20 DIAGNOSIS — I10 Essential (primary) hypertension: Secondary | ICD-10-CM

## 2017-06-20 DIAGNOSIS — Z0000 Encounter for general adult medical examination without abnormal findings: Secondary | ICD-10-CM

## 2017-06-20 LAB — POCT GLYCOSYLATED HEMOGLOBIN (HGB A1C): HEMOGLOBIN A1C: 8.9

## 2017-06-20 NOTE — Progress Notes (Signed)
Subjective:    Patient ID: Steven Crane, male    DOB: 1960/07/12, 57 y.o.   MRN: [id number]  HPI The patient comes in today for a wellness visit.    A review of their health history was completed.  A review of medications was also completed.  Any needed refills; all meds  Eating habits: health conscious  Falls/  MVA accidents in past few months: none  Regular exercise: just work  Sales promotion account executive pt sees on regular basis: none  Preventative health issues were discussed.   Additional concerns: none  Results for orders placed or performed in visit on 06/20/17  POCT glycosylated hemoglobin (Hb A1C)  Result Value Ref Range   Hemoglobin A1C 8.9    Patient claims compliance with diabetes medication. No obvious side effects. Reports no substantial low sugar spells. Most numbers are generally in good range when checked fasting. Generally does not miss a dose of medication. Watching diabetic diet closely  Recent Results (from the past 2160 hour(s))  Lipid panel     Status: Abnormal   Collection Time: 03/22/17  9:04 AM  Result Value Ref Range   Cholesterol, Total 142 100 - 199 mg/dL   Triglycerides 161 (H) 0 - 149 mg/dL   HDL 34 (L) >39 mg/dL   VLDL Cholesterol Cal 32 5 - 40 mg/dL   LDL Calculated 76 0 - 99 mg/dL   Chol/HDL Ratio 4.2 0.0 - 5.0 ratio    Comment:                                   T. Chol/HDL Ratio                                             Men  Women                               1/2 Avg.Risk  3.4    3.3                                   Avg.Risk  5.0    4.4                                2X Avg.Risk  9.6    7.1                                3X Avg.Risk 23.4   11.0   Hepatic function panel     Status: Abnormal   Collection Time: 03/22/17  9:04 AM  Result Value Ref Range   Total Protein 6.9 6.0 - 8.5 g/dL   Albumin 4.2 3.5 - 5.5 g/dL   Bilirubin Total 0.3 0.0 - 1.2 mg/dL   Bilirubin, Direct 0.11 0.00 - 0.40 mg/dL   Alkaline Phosphatase 46 39 - 117 IU/L   AST  26 0 - 40 IU/L   ALT 46 (H) 0 - 44 IU/L  Basic metabolic panel     Status: Abnormal   Collection Time: 03/22/17  9:04 AM  Result Value Ref Range  Glucose 194 (H) 65 - 99 mg/dL   BUN 8 6 - 24 mg/dL   Creatinine, Ser 0.65 (L) 0.76 - 1.27 mg/dL   GFR calc non Af Amer 109 >59 mL/min/1.73   GFR calc Af Amer 126 >59 mL/min/1.73   BUN/Creatinine Ratio 12 9 - 20   Sodium 138 134 - 144 mmol/L   Potassium 4.8 3.5 - 5.2 mmol/L   Chloride 101 96 - 106 mmol/L   CO2 23 20 - 29 mmol/L   Calcium 8.8 8.7 - 10.2 mg/dL  PSA     Status: None   Collection Time: 03/22/17  9:04 AM  Result Value Ref Range   Prostate Specific Ag, Serum 0.2 0.0 - 4.0 ng/mL    Comment: Roche ECLIA methodology. According to the American Urological Association, Serum PSA should decrease and remain at undetectable levels after radical prostatectomy. The AUA defines biochemical recurrence as an initial PSA value 0.2 ng/mL or greater followed by a subsequent confirmatory PSA value 0.2 ng/mL or greater. Values obtained with different assay methods or kits cannot be used interchangeably. Results cannot be interpreted as absolute evidence of the presence or absence of malignant disease.   Microalbumin / creatinine urine ratio     Status: None   Collection Time: 03/22/17  9:04 AM  Result Value Ref Range   Creatinine, Urine 105.3 Not Estab. mg/dL   Microalbumin, Urine 3.1 Not Estab. ug/mL   Microalb/Creat Ratio 2.9 0.0 - 30.0 mg/g creat    Comment:                      Normal:                0.0 -  30.0                      Albuminuria:          31.0 - 300.0                      Clinical albuminuria:       >300.0   POCT glycosylated hemoglobin (Hb A1C)     Status: None   Collection Time: 06/20/17  8:54 AM  Result Value Ref Range   Hemoglobin A1C 8.9    Patient continues to take lipid medication regularly. No obvious side effects from it. Generally does not miss a dose. Prior blood work results are reviewed with patient.  Patient continues to work on fat intake in diet   Patient compliant with insomnia medication. Generally takes most nights. No obvious morning drowsiness. Definitely helps patient sleep. Without it patient states would not get a good nights rest.    Exercising with work only    So so on diet    Review of Systems  Constitutional: Negative for activity change, appetite change and fever.  HENT: Negative for congestion and rhinorrhea.   Eyes: Negative for discharge.  Respiratory: Negative for cough and wheezing.   Cardiovascular: Negative for chest pain.  Gastrointestinal: Negative for abdominal pain, blood in stool and vomiting.  Genitourinary: Negative for difficulty urinating and frequency.  Musculoskeletal: Negative for neck pain.  Skin: Negative for rash.  Allergic/Immunologic: Negative for environmental allergies and food allergies.  Neurological: Negative for weakness and headaches.  Psychiatric/Behavioral: Negative for agitation.  All other systems reviewed and are negative.      Objective:   Physical Exam  Constitutional: He appears well-developed and well-nourished.  HENT:  Head:  Normocephalic and atraumatic.  Right Ear: External ear normal.  Left Ear: External ear normal.  Nose: Nose normal.  Mouth/Throat: Oropharynx is clear and moist.  Eyes: Right eye exhibits no discharge. Left eye exhibits no discharge. No scleral icterus.  Neck: Normal range of motion. Neck supple. No thyromegaly present.  Cardiovascular: Normal rate, regular rhythm and normal heart sounds.  No murmur heard. Pulmonary/Chest: Effort normal and breath sounds normal. No respiratory distress. He has no wheezes.  Abdominal: Soft. Bowel sounds are normal. He exhibits no distension and no mass. There is no tenderness.  Genitourinary: Penis normal.  Musculoskeletal: Normal range of motion. He exhibits no edema.  Lymphadenopathy:    He has no cervical adenopathy.  Neurological: He is alert. He  exhibits normal muscle tone. Coordination normal.  Skin: Skin is warm and dry. No erythema.  Psychiatric: He has a normal mood and affect. His behavior is normal. Judgment normal.  Vitals reviewed.         Assessment & Plan:  Impression wellness exam up-to-date on colonoscopy.  Diet discussed.  Exercise discussed  2.  Type 2 diabetes poor control discussed.  Patient resistant to increase insulin he claims it makes it worse  3.  Hypertension decent control discussed maintain same meds  Blood work reviewed.  Patient work hard on diet recheck in 3 months.  At that time if A1c not improved will need to make an adjustment

## 2017-06-23 ENCOUNTER — Other Ambulatory Visit: Payer: Self-pay | Admitting: Family Medicine

## 2017-09-26 ENCOUNTER — Encounter: Payer: Self-pay | Admitting: Family Medicine

## 2017-09-26 ENCOUNTER — Ambulatory Visit: Payer: BC Managed Care – PPO | Admitting: Family Medicine

## 2017-09-26 VITALS — BP 142/78 | Ht 73.0 in | Wt 324.0 lb

## 2017-09-26 DIAGNOSIS — Z79899 Other long term (current) drug therapy: Secondary | ICD-10-CM

## 2017-09-26 DIAGNOSIS — B9689 Other specified bacterial agents as the cause of diseases classified elsewhere: Secondary | ICD-10-CM

## 2017-09-26 DIAGNOSIS — J019 Acute sinusitis, unspecified: Secondary | ICD-10-CM

## 2017-09-26 DIAGNOSIS — E785 Hyperlipidemia, unspecified: Secondary | ICD-10-CM | POA: Diagnosis not present

## 2017-09-26 DIAGNOSIS — I10 Essential (primary) hypertension: Secondary | ICD-10-CM

## 2017-09-26 DIAGNOSIS — E119 Type 2 diabetes mellitus without complications: Secondary | ICD-10-CM | POA: Diagnosis not present

## 2017-09-26 LAB — POCT GLYCOSYLATED HEMOGLOBIN (HGB A1C): Hemoglobin A1C: 9.5 % — AB (ref 4.0–5.6)

## 2017-09-26 MED ORDER — AMOXICILLIN-POT CLAVULANATE 875-125 MG PO TABS: 1.0000 | ORAL_TABLET | Freq: Two times a day (BID) | ORAL | 0 refills | Status: DC

## 2017-09-26 MED ORDER — ALPRAZOLAM 0.5 MG PO TABS: ORAL_TABLET | ORAL | 5 refills | Status: DC

## 2017-09-26 MED ORDER — METOPROLOL SUCCINATE ER 50 MG PO TB24: 50.0000 mg | ORAL_TABLET | Freq: Every day | ORAL | 1 refills | Status: DC

## 2017-09-26 MED ORDER — AMLODIPINE BESYLATE 5 MG PO TABS: 5.0000 mg | ORAL_TABLET | Freq: Every day | ORAL | 1 refills | Status: DC

## 2017-09-26 MED ORDER — INSULIN DETEMIR 100 UNIT/ML ~~LOC~~ SOLN: SUBCUTANEOUS | 5 refills | Status: DC

## 2017-09-26 MED ORDER — ENALAPRIL MALEATE 20 MG PO TABS: 20.0000 mg | ORAL_TABLET | Freq: Two times a day (BID) | ORAL | 1 refills | Status: DC

## 2017-09-26 MED ORDER — OMEPRAZOLE 40 MG PO CPDR: 40.0000 mg | DELAYED_RELEASE_CAPSULE | Freq: Every day | ORAL | 1 refills | Status: DC

## 2017-09-26 MED ORDER — GLYBURIDE-METFORMIN 5-500 MG PO TABS: 2.0000 | ORAL_TABLET | Freq: Two times a day (BID) | ORAL | 1 refills | Status: DC

## 2017-09-26 MED ORDER — FENOFIBRATE 160 MG PO TABS: 160.0000 mg | ORAL_TABLET | Freq: Every day | ORAL | 1 refills | Status: DC

## 2017-09-26 MED ORDER — BLOOD GLUCOSE METER KIT: PACK | 5 refills | Status: AC

## 2017-09-26 MED ORDER — PRAVASTATIN SODIUM 40 MG PO TABS: 40.0000 mg | ORAL_TABLET | Freq: Every day | ORAL | 1 refills | Status: DC

## 2017-09-26 MED ORDER — CLOPIDOGREL BISULFATE 75 MG PO TABS: 75.0000 mg | ORAL_TABLET | Freq: Every day | ORAL | 1 refills | Status: DC

## 2017-09-26 NOTE — Progress Notes (Signed)
   Subjective:    Patient ID: Steven Crane, male    DOB: June 20, 1960, 57 y.o.   MRN: [id number]  Diabetes  He presents for his follow-up diabetic visit. He has type 2 diabetes mellitus. He is compliant with treatment all of the time. He is following a generally unhealthy diet. He never participates in exercise. Home blood sugar record trend: does not check blood sugar. He does not see a podiatrist.Eye exam is current (feb 2019).   Pt states no concerns today.   Results for orders placed or performed in visit on 09/26/17  POCT glycosylated hemoglobin (Hb A1C)  Result Value Ref Range   Hemoglobin A1C 9.5 (A) 4.0 - 5.6 %   HbA1c, POC (prediabetic range)  5.7 - 6.4 %   HbA1c, POC (controlled diabetic range)  0.0 - 7.0 %  HM DIABETES EYE EXAM  Result Value Ref Range   HM Diabetic Eye Exam No Retinopathy No Retinopathy    Patient continues to take lipid medication regularly. No obvious side effects from it. Generally does not miss a dose. Prior blood work results are reviewed with patient. Patient continues to work on fat intake in diet  Patient claims compliance with diabetes medication. No obvious side effects. Reports no substantial low sugar spells. Most numbers are generally in good range when checked fasting. Generally does not miss a dose of medication. Watching diabetic diet closely  Blood pressure medicine and blood pressure levels reviewed today with patient. Compliant with blood pressure medicine. States does not miss a dose. No obvious side effects. Blood pressure generally good when checked elsewhere. Watching salt intake.   On 90 units of insulin   Review of Systems No headache, no major weight loss or weight gain, no chest pain no back pain abdominal pain no change in bowel habits complete ROS otherwise negative     Objective:   Physical Exam  Alert and oriented, vitals reviewed and stable, NAD ENT-TM's and ext canals WNL positive nasal congestion frontal frontal  tenderness otherwise bilat via otoscopic exam Soft palate, tonsils and post pharynx WNL via oropharyngeal exam Neck-symmetric, no masses; thyroid nonpalpable and nontender Pulmonary-no tachypnea or accessory muscle use; Clear without wheezes via auscultation Card--no abnrml murmurs, rhythm reg and rate WNL Carotid pulses symmetric, without bruits       Assessment & Plan:  1 rhinosinusitis protracted discussed  2.  Type 2 diabetes suboptimal control rationale discussed divided insulin to 50 units twice daily.  Start checking sugars again.  Call us in 4 weeks with results.  3.  Hyperlipidemia status uncertain prior blood work reviewed blood work ordered further recommendations based results  4.  Hypertension blood pressure good on repeat maintain same meds rationale discussed  Antibiotics prescribed chronic medicine prescribed appropriate blood work insulin change as noted follow-up in several months

## 2017-09-27 LAB — HEPATIC FUNCTION PANEL
ALK PHOS: 39 IU/L (ref 39–117)
ALT: 54 IU/L — ABNORMAL HIGH (ref 0–44)
AST: 36 IU/L (ref 0–40)
Albumin: 4 g/dL (ref 3.5–5.5)
BILIRUBIN TOTAL: 0.3 mg/dL (ref 0.0–1.2)
BILIRUBIN, DIRECT: 0.11 mg/dL (ref 0.00–0.40)
TOTAL PROTEIN: 6.6 g/dL (ref 6.0–8.5)

## 2017-09-27 LAB — LIPID PANEL
CHOL/HDL RATIO: 5.4 ratio — AB (ref 0.0–5.0)
Cholesterol, Total: 152 mg/dL (ref 100–199)
HDL: 28 mg/dL — AB (ref >39)
LDL Calculated: 68 mg/dL (ref 0–99)
Triglycerides: 278 mg/dL — ABNORMAL HIGH (ref 0–149)
VLDL Cholesterol Cal: 56 mg/dL — ABNORMAL HIGH (ref 5–40)

## 2017-09-28 ENCOUNTER — Encounter: Payer: Self-pay | Admitting: Family Medicine

## 2017-12-26 ENCOUNTER — Ambulatory Visit: Payer: BC Managed Care – PPO | Admitting: Family Medicine

## 2017-12-30 ENCOUNTER — Telehealth: Payer: Self-pay | Admitting: Family Medicine

## 2017-12-30 NOTE — Telephone Encounter (Signed)
Patient has an appt on 01/02/18 for a follow up appt with Dr.Steve. Pt is wondering if any lab work needs to be completed and if so, then to send orders to lab so pt can complete before set appt date. Please advise and inform pt.

## 2017-12-30 NOTE — Telephone Encounter (Signed)
No A1c then

## 2017-12-30 NOTE — Telephone Encounter (Signed)
Pt is aware no labs are needed. Will get a1c here at ov.

## 2017-12-30 NOTE — Telephone Encounter (Signed)
Last labs 09/26/17: Lipid, Liver and HgbA1c

## 2018-01-02 ENCOUNTER — Ambulatory Visit: Payer: BC Managed Care – PPO | Admitting: Family Medicine

## 2018-01-02 ENCOUNTER — Encounter: Payer: Self-pay | Admitting: Family Medicine

## 2018-01-02 VITALS — BP 138/90 | Ht 73.0 in | Wt 318.0 lb

## 2018-01-02 DIAGNOSIS — E785 Hyperlipidemia, unspecified: Secondary | ICD-10-CM | POA: Diagnosis not present

## 2018-01-02 DIAGNOSIS — I10 Essential (primary) hypertension: Secondary | ICD-10-CM

## 2018-01-02 DIAGNOSIS — E119 Type 2 diabetes mellitus without complications: Secondary | ICD-10-CM

## 2018-01-02 LAB — POCT GLYCOSYLATED HEMOGLOBIN (HGB A1C): HEMOGLOBIN A1C: 7.6 % — AB (ref 4.0–5.6)

## 2018-01-02 MED ORDER — INSULIN DETEMIR 100 UNIT/ML ~~LOC~~ SOLN: SUBCUTANEOUS | 5 refills | Status: DC

## 2018-01-02 MED ORDER — ENALAPRIL MALEATE 20 MG PO TABS: 20.0000 mg | ORAL_TABLET | Freq: Two times a day (BID) | ORAL | 1 refills | Status: DC

## 2018-01-02 MED ORDER — FENOFIBRATE 160 MG PO TABS: 160.0000 mg | ORAL_TABLET | Freq: Every day | ORAL | 1 refills | Status: DC

## 2018-01-02 MED ORDER — METOPROLOL SUCCINATE ER 50 MG PO TB24: 50.0000 mg | ORAL_TABLET | Freq: Every day | ORAL | 1 refills | Status: DC

## 2018-01-02 MED ORDER — CLOPIDOGREL BISULFATE 75 MG PO TABS: 75.0000 mg | ORAL_TABLET | Freq: Every day | ORAL | 1 refills | Status: DC

## 2018-01-02 MED ORDER — GLYBURIDE-METFORMIN 5-500 MG PO TABS: 2.0000 | ORAL_TABLET | Freq: Two times a day (BID) | ORAL | 1 refills | Status: DC

## 2018-01-02 MED ORDER — PANTOPRAZOLE SODIUM 40 MG PO TBEC: DELAYED_RELEASE_TABLET | ORAL | 3 refills | Status: DC

## 2018-01-02 MED ORDER — AMLODIPINE BESYLATE 5 MG PO TABS: 5.0000 mg | ORAL_TABLET | Freq: Every day | ORAL | 1 refills | Status: DC

## 2018-01-02 MED ORDER — NEOMYCIN-POLYMYXIN-HC 3.5-10000-1 OT SOLN: OTIC | 0 refills | Status: DC

## 2018-01-02 MED ORDER — PRAVASTATIN SODIUM 40 MG PO TABS: 40.0000 mg | ORAL_TABLET | Freq: Every day | ORAL | 1 refills | Status: DC

## 2018-01-02 NOTE — Progress Notes (Signed)
   Subjective:    Patient ID: Steven Crane, male    DOB: 09-Sep-1960, 57 y.o.   MRN: [id number]  Diabetes  He presents for his follow-up diabetic visit. He has type 2 diabetes mellitus. There are no hypoglycemic associated symptoms. There are no diabetic associated symptoms. There are no hypoglycemic complications. There are no diabetic complications. He does not see a podiatrist.Eye exam is current.     Results for orders placed or performed in visit on 01/02/18  POCT HgB A1C  Result Value Ref Range   Hemoglobin A1C 7.6 (A) 4.0 - 5.6 %   HbA1c POC (<> result, manual entry)     HbA1c, POC (prediabetic range)     HbA1c, POC (controlled diabetic range)      Patient claims compliance with diabetes medication. No obvious side effects. Reports no substantial low sugar spells. Most numbers are generally in good range when checked fasting. Generally does not miss a dose of medication. Watching diabetic diet closely  Patient continues to take lipid medication regularly. No obvious side effects from it. Generally does not miss a dose. Prior blood work results are reviewed with patient. Patient continues to work on fat intake in diet  Blood pressure medicine and blood pressure levels reviewed today with patient. Compliant with blood pressure medicine. States does not miss a dose. No obvious side effects. Blood pressure generally good when checked elsewhere. Watching salt intake.     Review of Systems No headache, no major weight loss or weight gain, no chest pain no back pain abdominal pain no change in bowel habits complete ROS otherwise negative     Objective:   Physical Exam Alert and oriented, vitals reviewed and stable, NAD ENT-TM's and ext canals WNL bilat via otoscopic exam Soft palate, tonsils and post pharynx WNL via oropharyngeal exam Neck-symmetric, no masses; thyroid nonpalpable and nontender Pulmonary-no tachypnea or accessory muscle use; Clear without wheezes via  auscultation Card--no abnrml murmurs, rhythm reg and rate WNL Carotid pulses symmetric, without bruits External ear canals inflamed irritated somewhat crusted       Assessment & Plan:  Impression 1 type 2 diabetes much improved A1c to maintain same therapy discussed her graph #2 hypertension blood pressures reviewed compliance reviewed to maintain same  3.  Hyperlipidemia prior blood work reviewed  4.  External otitis will initiate therapy  5.  Omeprazole Plavix interaction will switch to Protonix rationale discussed  Received flu shot elsewhere/meds refilled diet exercise discussed follow-up as scheduled

## 2018-03-06 ENCOUNTER — Encounter: Payer: Self-pay | Admitting: Family Medicine

## 2018-03-06 ENCOUNTER — Ambulatory Visit: Payer: BC Managed Care – PPO | Admitting: Family Medicine

## 2018-03-06 VITALS — BP 152/80 | Temp 98.3°F | Ht 73.0 in | Wt 316.0 lb

## 2018-03-06 DIAGNOSIS — J019 Acute sinusitis, unspecified: Secondary | ICD-10-CM | POA: Diagnosis not present

## 2018-03-06 DIAGNOSIS — B9689 Other specified bacterial agents as the cause of diseases classified elsewhere: Secondary | ICD-10-CM

## 2018-03-06 MED ORDER — INSULIN DETEMIR 100 UNIT/ML ~~LOC~~ SOLN: SUBCUTANEOUS | 5 refills | Status: DC

## 2018-03-06 MED ORDER — PREDNISONE 20 MG PO TABS: ORAL_TABLET | ORAL | 0 refills | Status: DC

## 2018-03-06 MED ORDER — AMOXICILLIN-POT CLAVULANATE 875-125 MG PO TABS: ORAL_TABLET | ORAL | 0 refills | Status: DC

## 2018-03-06 NOTE — Progress Notes (Signed)
   Subjective:    Patient ID: Steven Crane, male    DOB: October 12, 1960, 57 y.o.   MRN: [id number]  HPI Patient is here today with complaints of runny nose and sneezing, sinus drainage and cough.He says this started on Wednesday. He has been taking otc cold and sinus.  Bad sneezing and nose running   dranign and popping and cong  Frontal headache  Worse with cough   Sharp at ties  Achy at times  Off and on cough    Review of Systems ./rs No headache, no major weight loss or weight gain, no chest pain no back pain abdominal pain no change in bowel habits complete ROS otherwise negative     Objective:   Physical Exam  Alert, mild malaise. Hydration good Vitals stable. frontal/ maxillary tenderness evident positive nasal congestion. pharynx normal neck supple  lungs clear/no crackles or wheezes. heart regular in rhythm       Assessment & Plan:  Impression rhinosinusitis likely post viral, discussed with patient. plan antibiotics prescribed. Questions answered. Symptomatic care discussed. warning signs discussed. WSL

## 2018-04-03 ENCOUNTER — Ambulatory Visit: Payer: BC Managed Care – PPO | Admitting: Family Medicine

## 2018-04-06 ENCOUNTER — Ambulatory Visit: Payer: BC Managed Care – PPO | Admitting: Family Medicine

## 2018-04-09 ENCOUNTER — Ambulatory Visit: Payer: BC Managed Care – PPO | Admitting: Family Medicine

## 2018-04-09 ENCOUNTER — Encounter: Payer: Self-pay | Admitting: Family Medicine

## 2018-04-09 ENCOUNTER — Telehealth: Payer: Self-pay | Admitting: Family Medicine

## 2018-04-09 VITALS — BP 144/86 | Ht 73.0 in | Wt 310.8 lb

## 2018-04-09 DIAGNOSIS — I498 Other specified cardiac arrhythmias: Secondary | ICD-10-CM | POA: Diagnosis not present

## 2018-04-09 DIAGNOSIS — Z23 Encounter for immunization: Secondary | ICD-10-CM | POA: Diagnosis not present

## 2018-04-09 DIAGNOSIS — E119 Type 2 diabetes mellitus without complications: Secondary | ICD-10-CM | POA: Diagnosis not present

## 2018-04-09 DIAGNOSIS — Z125 Encounter for screening for malignant neoplasm of prostate: Secondary | ICD-10-CM

## 2018-04-09 DIAGNOSIS — Z79899 Other long term (current) drug therapy: Secondary | ICD-10-CM

## 2018-04-09 DIAGNOSIS — E785 Hyperlipidemia, unspecified: Secondary | ICD-10-CM

## 2018-04-09 DIAGNOSIS — I2510 Atherosclerotic heart disease of native coronary artery without angina pectoris: Secondary | ICD-10-CM

## 2018-04-09 LAB — POCT GLYCOSYLATED HEMOGLOBIN (HGB A1C): HEMOGLOBIN A1C: 9.2 % — AB (ref 4.0–5.6)

## 2018-04-09 MED ORDER — PANTOPRAZOLE SODIUM 40 MG PO TBEC: DELAYED_RELEASE_TABLET | ORAL | 1 refills | Status: DC

## 2018-04-09 MED ORDER — CLOPIDOGREL BISULFATE 75 MG PO TABS: 75.0000 mg | ORAL_TABLET | Freq: Every day | ORAL | 1 refills | Status: DC

## 2018-04-09 MED ORDER — NITROGLYCERIN 0.4 MG SL SUBL: 0.4000 mg | SUBLINGUAL_TABLET | SUBLINGUAL | 0 refills | Status: DC | PRN

## 2018-04-09 MED ORDER — ALPRAZOLAM 0.5 MG PO TABS: ORAL_TABLET | ORAL | 5 refills | Status: DC

## 2018-04-09 MED ORDER — FENOFIBRATE 160 MG PO TABS: 160.0000 mg | ORAL_TABLET | Freq: Every day | ORAL | 1 refills | Status: DC

## 2018-04-09 MED ORDER — PRAVASTATIN SODIUM 40 MG PO TABS: 40.0000 mg | ORAL_TABLET | Freq: Every day | ORAL | 1 refills | Status: DC

## 2018-04-09 MED ORDER — INSULIN DETEMIR 100 UNIT/ML ~~LOC~~ SOLN: SUBCUTANEOUS | 5 refills | Status: DC

## 2018-04-09 MED ORDER — AMLODIPINE BESYLATE 5 MG PO TABS: 5.0000 mg | ORAL_TABLET | Freq: Every day | ORAL | 1 refills | Status: DC

## 2018-04-09 MED ORDER — METOPROLOL SUCCINATE ER 50 MG PO TB24: 50.0000 mg | ORAL_TABLET | Freq: Every day | ORAL | 1 refills | Status: DC

## 2018-04-09 MED ORDER — ENALAPRIL MALEATE 20 MG PO TABS: 20.0000 mg | ORAL_TABLET | Freq: Two times a day (BID) | ORAL | 1 refills | Status: DC

## 2018-04-09 MED ORDER — GLYBURIDE-METFORMIN 5-500 MG PO TABS: 2.0000 | ORAL_TABLET | Freq: Two times a day (BID) | ORAL | 1 refills | Status: DC

## 2018-04-09 NOTE — Telephone Encounter (Signed)
Lip liv m7 psa urine protein

## 2018-04-09 NOTE — Telephone Encounter (Signed)
Please advise. Thank you

## 2018-04-09 NOTE — Telephone Encounter (Signed)
bw orders put in.

## 2018-04-09 NOTE — Progress Notes (Signed)
   Subjective:    Patient ID: Steven Crane, male    DOB: December 15, 1960, 57 y.o.   MRN: [id number]  Diabetes  He presents for his follow-up diabetic visit. He has type 2 diabetes mellitus. Compliance with diabetes treatment: has had to cut back on levemir because he does not get enough for 30 days at a time.   Having some chest pressure and heart flutter. Feels like it did when he had his blockages. Notes fluttering in the chest often at rest   Notes gets winded with exertion, no true chest pain   Patient claims compliance with diabetes medication. No obvious side effects. Reports no substantial low sugar spells. Most numbers are generally in good range when checked fasting. Generally does not miss a dose of medication. Watching diabetic diet closely   Needs refill on nitroglycerin. His bottle got wet and the meds dissolved.   Results for orders placed or performed in visit on 04/09/18  POCT glycosylated hemoglobin (Hb A1C)  Result Value Ref Range   Hemoglobin A1C 9.2 (A) 4.0 - 5.6 %   HbA1c POC (<> result, manual entry)     HbA1c, POC (prediabetic range)     HbA1c, POC (controlled diabetic range)        Blood pressure medicine and blood pressure levels reviewed today with patient. Compliant with blood pressure medicine. States does not miss a dose. No obvious side effects. Blood pressure generally good when checked elsewhere. Watching salt intake.   Patient continues to take lipid medication regularly. No obvious side effects from it. Generally does not miss a dose. Prior blood work results are reviewed with patient. Patient continues to work on fat intake in diet    Review of Systems .rs No headache, no major weight loss or weight gain, no chest pain no back pain abdominal pain no change in bowel habits complete ROS otherwise negative     Objective:   Physical Exam Alert and oriented, vitals reviewed and stable, NAD ENT-TM's and ext canals WNL bilat via otoscopic exam Soft  palate, tonsils and post pharynx WNL via oropharyngeal exam Neck-symmetric, no masses; thyroid nonpalpable and nontender Pulmonary-no tachypnea or accessory muscle use; Clear without wheezes via auscultation Card--no abnrml murmurs, rhythm reg and rate WNL Carotid pulses symmetric, without bruits        Assessment & Plan:  Impression hypertension very good control discussed to maintain same meds  2.  Hyperlipidemia status uncertain check appropriate blood work  3.  Coronary artery disease.  Patient notes nonspecific palpitations.  At rest.  EKG stable today.  Old inferior Q waves.  No acute ST-T changes.  Time to get back to the cardiologist discussed and recommended  4.  Type 2 diabetes poor control.  Patient very resistant to increasing insulin promises he will work harder on his diet to go up back up to usual dose of insulin patient has backed off over the last couple months  Re ck three mo/card ref

## 2018-04-09 NOTE — Telephone Encounter (Signed)
Patient wants to know if he is suppose to have labwork done before he comes back in March for his 3 month check?

## 2018-04-09 NOTE — Telephone Encounter (Signed)
Orders put in. Pt notified.  

## 2018-04-10 ENCOUNTER — Encounter: Payer: Self-pay | Admitting: Cardiovascular Disease

## 2018-04-10 ENCOUNTER — Ambulatory Visit: Payer: BC Managed Care – PPO | Admitting: Cardiovascular Disease

## 2018-04-10 VITALS — BP 142/82 | HR 87 | Ht 74.0 in | Wt 312.0 lb

## 2018-04-10 DIAGNOSIS — R002 Palpitations: Secondary | ICD-10-CM

## 2018-04-10 DIAGNOSIS — R079 Chest pain, unspecified: Secondary | ICD-10-CM | POA: Diagnosis not present

## 2018-04-10 DIAGNOSIS — I10 Essential (primary) hypertension: Secondary | ICD-10-CM

## 2018-04-10 DIAGNOSIS — E785 Hyperlipidemia, unspecified: Secondary | ICD-10-CM

## 2018-04-10 DIAGNOSIS — I25118 Atherosclerotic heart disease of native coronary artery with other forms of angina pectoris: Secondary | ICD-10-CM

## 2018-04-10 DIAGNOSIS — K219 Gastro-esophageal reflux disease without esophagitis: Secondary | ICD-10-CM

## 2018-04-10 MED ORDER — ATORVASTATIN CALCIUM 40 MG PO TABS: 40.0000 mg | ORAL_TABLET | Freq: Every day | ORAL | 3 refills | Status: DC

## 2018-04-10 MED ORDER — ASPIRIN EC 81 MG PO TBEC: 81.0000 mg | DELAYED_RELEASE_TABLET | Freq: Every day | ORAL | 3 refills | Status: DC

## 2018-04-10 NOTE — Patient Instructions (Signed)
Medication Instructions:  STOP PLAVIX STOP PRAVASTATIN  START ASPIRIN 81 MG DAILY  START ATORVASTATIN 40 MG DAILY   Labwork: NONE  Testing/Procedures: Your physician has requested that you have en exercise stress myoview. For further information please visit [email or web address]. Please follow instruction sheet, as given.  Your physician has recommended that you wear an event monitor. Event monitors are medical devices that record the heart's electrical activity. Doctors most often [place] these monitors to diagnose arrhythmias. Arrhythmias are problems with the speed or rhythm of the heartbeat. The monitor is a small, portable device. You can wear one while you do your normal daily activities. This is usually used to diagnose what is causing palpitations/syncope (passing out). 25 DAYS    Follow-Up: Your physician recommends that you schedule a follow-up appointment in: 4 MONTHS    Any Other Special Instructions Will Be Listed Below (If Applicable).     If you need a refill on your cardiac medications before your next appointment, please call your pharmacy.

## 2018-04-10 NOTE — Progress Notes (Signed)
SUBJECTIVE: The patient presents for routine cardiovascular followup. He underwent drug-eluting stent placement to the proximal first obtuse marginal branch as well as the distal RCA in 08/2013.  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   PCP notes indicate he has been having some palpitations.  Upon speaking with him, he tells me he has had sporadic palpitations over the last 4 to 5 months.  He said the last 7 or 8 beats at a time.  He also has retrosternal chest pains which can occur both with and without exertion but no episodes of required nitroglycerin or a hospital visit.  It appears he was switched from aspirin to Plavix for unclear reasons.  Review of labs indicates elevated A1c of 9.2% on 04/09/2018.  He denies leg swelling, orthopnea, and paroxysmal nocturnal dyspnea.    Review of Systems: As per "subjective", otherwise negative.  Allergies  Allergen Reactions  . Adhesive [Tape] Rash    Current Outpatient Medications  Medication Sig Dispense Refill  . acetaminophen (TYLENOL) 500 MG tablet Take 1,000 mg by mouth 2 (two) times daily.    Marland Kitchen albuterol (PROVENTIL HFA;VENTOLIN HFA) 108 (90 Base) MCG/ACT inhaler Inhale 2 puffs into the lungs every 6 (six) hours as needed for wheezing or shortness of breath. 1 Inhaler 2  . ALPRAZolam (XANAX) 0.5 MG tablet TAKE ONE-HALF TO ONE TABLET BY MOUTH ONCE DAILY AS NEEDED 30 tablet 5  . amLODipine (NORVASC) 5 MG tablet Take 1 tablet (5 mg total) by mouth daily. 90 tablet 1  . blood glucose meter kit and supplies Test glucose once daily. ICD 10 E11.9 1 each 5  . clopidogrel (PLAVIX) 75 MG tablet Take 1 tablet (75 mg total) by mouth daily. 90 tablet 1  . enalapril (VASOTEC) 20 MG tablet Take 1 tablet (20 mg total) by mouth 2 (two) times daily. 180 tablet 1  . fenofibrate 160 MG tablet Take 1 tablet (160 mg total) by mouth daily. 90 tablet 1  . glyBURIDE-metformin (GLUCOVANCE) 5-500 MG tablet  Take 2 tablets by mouth 2 (two) times daily. 360 tablet 1  . ibuprofen (ADVIL,MOTRIN) 800 MG tablet Take one up to bid 60 tablet 2  . insulin detemir (LEVEMIR) 100 UNIT/ML injection INJECT 55 units BID 3 vial 5  . metoprolol succinate (TOPROL-XL) 50 MG 24 hr tablet Take 1 tablet (50 mg total) by mouth daily. Take with or immediately following a meal. 90 tablet 1  . nitroGLYCERIN (NITROSTAT) 0.4 MG SL tablet Place 1 tablet (0.4 mg total) under the tongue every 5 (five) minutes as needed for chest pain. If no relief call 911. 60 tablet 0  . pantoprazole (PROTONIX) 40 MG tablet Take one tablet every day 90 tablet 1  . pravastatin (PRAVACHOL) 40 MG tablet Take 1 tablet (40 mg total) by mouth daily. 90 tablet 1   No current facility-administered medications for this visit.     Past Medical History:  Diagnosis Date  . Arthritis    "hands, back, knees" (08/27/2013)  . Asthma   . CHF (congestive heart failure) ([place]) 02/2010  . Chronic bronchitis ([place])    "get it q year"  . Chronic lower back pain   . Closed head injury 1975  . Coronary artery disease May 2015   s/p successful PTCA/DES x 1 to distal RCA and PTCA/DES x 1 to OM-20 Aug 2013  . DVT (deep venous thrombosis) ([place]) 2008   "LLE"  .  GERD (gastroesophageal reflux disease)   . Headache(784.0)    "comes and goes; sometimes qd; sometimes weekly" (08/27/2013)  . Hyperlipemia   . Hypertension   . Hypertriglyceridemia   . LV dysfunction    LVEF 40% at time of cardiac cath May 2015  . Morbid obesity ([place])   . Myocardial infarction ([place]) 02/2010  . Psoriasis   . Type II diabetes mellitus ([place])     Past Surgical History:  Procedure Laterality Date  . COLONOSCOPY  01/06/2012   Procedure: COLONOSCOPY;  Surgeon: [name], MD;  [facility];  Service: Endoscopy;  Laterality: N/A;  7:30 AM  . CORONARY ANGIOPLASTY WITH STENT PLACEMENT  02/2010; 08/27/2013   "1 + 3"   . KNEE ARTHROSCOPY Left 1981  . LEFT HEART CATHETERIZATION  WITH CORONARY ANGIOGRAM N/A 08/27/2013   Procedure: LEFT HEART CATHETERIZATION WITH CORONARY ANGIOGRAM;  Surgeon: [name], MD;  Location: [facility] CATH LAB;  Service: Cardiovascular;  Laterality: N/A;    Social History   Socioeconomic History  . Marital status: Single    Spouse name: Not on file  . Number of children: Not on file  . Years of education: Not on file  . Highest education level: Not on file  Occupational History  . Not on file  Social Needs  . Financial resource strain: Not on file  . Food insecurity:    Worry: Not on file    Inability: Not on file  . Transportation needs:    Medical: Not on file    Non-medical: Not on file  Tobacco Use  . Smoking status: Never Smoker  . Smokeless tobacco: Current User    Types: Snuff, Chew  . Tobacco comment: "started dipping @ age 53; quit for 5 1/2 yrs at one time; hadn't chewed in awhile"  Substance and Sexual Activity  . Alcohol use: Yes    Alcohol/week: 0.0 standard drinks    Comment: 08/27/2013 "drink beer and liquor; don't drink much"  . Drug use: No  . Sexual activity: Yes  Lifestyle  . Physical activity:    Days per week: Not on file    Minutes per session: Not on file  . Stress: Not on file  Relationships  . Social connections:    Talks on phone: Not on file    Gets together: Not on file    Attends religious service: Not on file    Active member of club or organization: Not on file    Attends meetings of clubs or organizations: Not on file    Relationship status: Not on file  . Intimate partner violence:    Fear of current or ex partner: Not on file    Emotionally abused: Not on file    Physically abused: Not on file    Forced sexual activity: Not on file  Other Topics Concern  . Not on file  Social History Narrative  . Not on file     Vitals:   04/10/18 0849  BP: (!) 142/82  Pulse: 87  SpO2: 97%  Weight: (!) 312 lb (141.5 kg)  Height: '6\' 2"'  (1.88 m)    Wt Readings from Last 3 Encounters:    04/10/18 (!) 312 lb (141.5 kg)  04/09/18 (!) 310 lb 12.8 oz (141 kg)  03/06/18 (!) 316 lb (143.3 kg)     PHYSICAL EXAM General: NAD HEENT: Normal. Neck: No JVD, no thyromegaly. Lungs: Clear to auscultation bilaterally with normal respiratory effort. CV: Regular rate and rhythm,  normal S1/S2, no S3/S4, no murmur. No pretibial or periankle edema.  No carotid bruit.   Abdomen: Soft, nontender, no distention.  Neurologic: Alert and oriented.  Psych: Normal affect. Skin: Normal. Musculoskeletal: No gross deformities.    ECG: Reviewed above under Subjective   Labs: Lab Results  Component Value Date/Time   K 4.8 03/22/2017 09:04 AM   BUN 8 03/22/2017 09:04 AM   CREATININE 0.65 (L) 03/22/2017 09:04 AM   CREATININE 0.72 01/13/2014 08:28 AM   ALT 54 (H) 09/26/2017 09:21 AM   HGB 11.2 (L) 08/28/2013 05:11 AM     Lipids: Lab Results  Component Value Date/Time   LDLCALC 68 09/26/2017 09:21 AM   CHOL 152 09/26/2017 09:21 AM   TRIG 278 (H) 09/26/2017 09:21 AM   HDL 28 (L) 09/26/2017 09:21 AM       ASSESSMENT AND PLAN:  1.  Coronary artery disease: S/p DES x 2 to proximal OM1 and distal RCA in 08/2013. He has been experiencing chest pains both with and without exertion..  I will stop Plavix given the lack of benefit/indication in this patient and resume aspirin 81 mg daily.  Continue metoprolol succinate.  I will switch pravastatin to atorvastatin 40 mg as he would benefit from a more potent statin.   I will arrange for an exercise Myoview stress test.  I will hold Toprol-XL the day of the study.  2. Essential HTN:  Mildly elevated today. No changes.  3. Hyperlipidemia: Lipids reviewed above.  LDL at goal.  Triglycerides are elevated.  I will switch pravastatin to atorvastatin 40 mg as he would benefit from a more potent statin.   4. Morbid obesity: Weight loss encouraged.  5.  Palpitations: I will obtain a 30-day event monitor.  Continue Toprol-XL.  6.  GERD: Currently on  Protonix.  This was apparently switched from omeprazole as he is on Plavix.  He said Protonix does not help him and he has increased reflux.  As I am switching him to aspirin, he can be switched back to omeprazole.  I will defer to his PCP regarding this.   Disposition: Follow up 4 months   Kate Sable, M.D., F.A.C.C.

## 2018-04-21 ENCOUNTER — Encounter: Payer: Self-pay | Admitting: Family Medicine

## 2018-04-24 ENCOUNTER — Ambulatory Visit (HOSPITAL_COMMUNITY)
Admission: RE | Admit: 2018-04-24 | Discharge: 2018-04-24 | Disposition: A | Discharge status: Home / Self Care | Payer: BC Managed Care – PPO | Source: Ambulatory Visit | Attending: Cardiovascular Disease | Admitting: Cardiovascular Disease

## 2018-04-24 ENCOUNTER — Ambulatory Visit (HOSPITAL_BASED_OUTPATIENT_CLINIC_OR_DEPARTMENT_OTHER)
Admission: RE | Admit: 2018-04-24 | Discharge: 2018-04-24 | Disposition: A | Discharge status: Home / Self Care | Payer: BC Managed Care – PPO | Source: Ambulatory Visit | Attending: Cardiovascular Disease | Admitting: Cardiovascular Disease

## 2018-04-24 DIAGNOSIS — R079 Chest pain, unspecified: Secondary | ICD-10-CM | POA: Diagnosis not present

## 2018-04-24 LAB — NM MYOCAR MULTI W/SPECT W/WALL MOTION / EF
CHL CUP NUCLEAR SRS: 6
CSEPHR: 85 %
Estimated workload: 10.4 METS
Exercise duration (min): 8 min
Exercise duration (sec): 0 s
LV dias vol: 145 mL (ref 62–150)
LV sys vol: 69 mL
MPHR: 163 {beats}/min
Peak HR: 139 {beats}/min
RATE: 0.38
RPE: 15
Rest HR: 68 {beats}/min
SDS: 5
SSS: 11
TID: 1.02

## 2018-04-24 MED ORDER — SODIUM CHLORIDE 0.9% FLUSH
INTRAVENOUS | Status: AC
  Administered 2018-04-24: 10 mL via INTRAVENOUS
  Filled 2018-04-24: qty 10

## 2018-04-24 MED ORDER — TECHNETIUM TC 99M TETROFOSMIN IV KIT
10.0000 | PACK | Freq: Once | INTRAVENOUS | Status: AC | PRN
  Administered 2018-04-24: 10 via INTRAVENOUS

## 2018-04-24 MED ORDER — TECHNETIUM TC 99M TETROFOSMIN IV KIT
30.0000 | PACK | Freq: Once | INTRAVENOUS | Status: AC | PRN
  Administered 2018-04-24: 29 via INTRAVENOUS

## 2018-04-24 MED ORDER — REGADENOSON 0.4 MG/5ML IV SOLN
INTRAVENOUS | Status: AC
  Filled 2018-04-24: qty 5

## 2018-07-10 ENCOUNTER — Ambulatory Visit: Payer: BC Managed Care – PPO | Admitting: Family Medicine

## 2018-07-11 LAB — BASIC METABOLIC PANEL
BUN/Creatinine Ratio: 20 (ref 9–20)
BUN: 12 mg/dL (ref 6–24)
CALCIUM: 9.2 mg/dL (ref 8.7–10.2)
CO2: 21 mmol/L (ref 20–29)
Chloride: 100 mmol/L (ref 96–106)
Creatinine, Ser: 0.61 mg/dL — ABNORMAL LOW (ref 0.76–1.27)
GFR calc Af Amer: 128 mL/min/{1.73_m2} (ref >59)
GFR calc non Af Amer: 111 mL/min/{1.73_m2} (ref >59)
Glucose: 238 mg/dL — ABNORMAL HIGH (ref 65–99)
Potassium: 4.9 mmol/L (ref 3.5–5.2)
Sodium: 136 mmol/L (ref 134–144)

## 2018-07-11 LAB — HEPATIC FUNCTION PANEL
ALT: 40 IU/L (ref 0–44)
AST: 23 IU/L (ref 0–40)
Albumin: 4.3 g/dL (ref 3.8–4.9)
Alkaline Phosphatase: 49 IU/L (ref 39–117)
Bilirubin Total: 0.2 mg/dL (ref 0.0–1.2)
Bilirubin, Direct: 0.08 mg/dL (ref 0.00–0.40)
Total Protein: 6.3 g/dL (ref 6.0–8.5)

## 2018-07-11 LAB — LIPID PANEL
Chol/HDL Ratio: 4.5 ratio (ref 0.0–5.0)
Cholesterol, Total: 116 mg/dL (ref 100–199)
HDL: 26 mg/dL — AB (ref >39)
LDL Calculated: 55 mg/dL (ref 0–99)
Triglycerides: 174 mg/dL — ABNORMAL HIGH (ref 0–149)
VLDL CHOLESTEROL CAL: 35 mg/dL (ref 5–40)

## 2018-07-11 LAB — MICROALBUMIN / CREATININE URINE RATIO
Creatinine, Urine: 76.8 mg/dL
Microalbumin, Urine: <3 ug/mL

## 2018-07-11 LAB — PSA: Prostate Specific Ag, Serum: 0.2 ng/mL (ref 0.0–4.0)

## 2018-07-15 ENCOUNTER — Ambulatory Visit (INDEPENDENT_AMBULATORY_CARE_PROVIDER_SITE_OTHER): Payer: BC Managed Care – PPO | Admitting: Family Medicine

## 2018-07-15 ENCOUNTER — Encounter: Payer: Self-pay | Admitting: Family Medicine

## 2018-07-15 ENCOUNTER — Other Ambulatory Visit: Payer: Self-pay

## 2018-07-15 VITALS — BP 132/84 | Ht 74.0 in | Wt 309.4 lb

## 2018-07-15 DIAGNOSIS — I10 Essential (primary) hypertension: Secondary | ICD-10-CM | POA: Diagnosis not present

## 2018-07-15 DIAGNOSIS — E119 Type 2 diabetes mellitus without complications: Secondary | ICD-10-CM | POA: Diagnosis not present

## 2018-07-15 DIAGNOSIS — I2510 Atherosclerotic heart disease of native coronary artery without angina pectoris: Secondary | ICD-10-CM

## 2018-07-15 LAB — POCT GLYCOSYLATED HEMOGLOBIN (HGB A1C): Hemoglobin A1C: 7.9 % — AB (ref 4.0–5.6)

## 2018-07-15 MED ORDER — PANTOPRAZOLE SODIUM 40 MG PO TBEC: DELAYED_RELEASE_TABLET | ORAL | 1 refills | Status: DC

## 2018-07-15 MED ORDER — ENALAPRIL MALEATE 20 MG PO TABS: 20.0000 mg | ORAL_TABLET | Freq: Two times a day (BID) | ORAL | 1 refills | Status: DC

## 2018-07-15 MED ORDER — AMLODIPINE BESYLATE 5 MG PO TABS: 5.0000 mg | ORAL_TABLET | Freq: Every day | ORAL | 1 refills | Status: DC

## 2018-07-15 MED ORDER — INSULIN DETEMIR 100 UNIT/ML ~~LOC~~ SOLN: SUBCUTANEOUS | 5 refills | Status: DC

## 2018-07-15 MED ORDER — FENOFIBRATE 160 MG PO TABS: 160.0000 mg | ORAL_TABLET | Freq: Every day | ORAL | 1 refills | Status: DC

## 2018-07-15 MED ORDER — GLYBURIDE-METFORMIN 5-500 MG PO TABS: 2.0000 | ORAL_TABLET | Freq: Two times a day (BID) | ORAL | 1 refills | Status: DC

## 2018-07-15 MED ORDER — METOPROLOL SUCCINATE ER 50 MG PO TB24: 50.0000 mg | ORAL_TABLET | Freq: Every day | ORAL | 1 refills | Status: DC

## 2018-07-15 NOTE — Progress Notes (Signed)
Subjective:    Patient ID: Steven Crane, male    DOB: 1961/03/10, 58 y.o.   MRN: [id number]  Diabetes  He presents for his follow-up diabetic visit. He has type 2 diabetes mellitus. Risk factors for coronary artery disease include diabetes mellitus, hypertension, obesity and dyslipidemia. Current diabetic treatment includes insulin injections and oral agent (monotherapy). He is compliant with treatment all of the time. His weight is stable. He is following a diabetic diet.   Discuss recent labs  Results for orders placed or performed in visit on 07/15/18  POCT glycosylated hemoglobin (Hb A1C)  Result Value Ref Range   Hemoglobin A1C 7.9 (A) 4.0 - 5.6 %   HbA1c POC (<> result, manual entry)     HbA1c, POC (prediabetic range)     HbA1c, POC (controlled diabetic range)     Blood pressure medicine and blood pressure levels reviewed today with patient. Compliant with blood pressure medicine. States does not miss a dose. No obvious side effects. Blood pressure generally good when checked elsewhere. Watching salt intake.    Patient continues to take lipid medication regularly. No obvious side effects from it. Generally does not miss a dose. Prior blood work results are reviewed with patient. Patient continues to work on fat intake in diet   Recent Results (from the past 2160 hour(s))  NM Myocar Multi W/Spect W/Wall Motion / EF     Status: None   Collection Time: 04/24/18  1:10 PM  Result Value Ref Range   Rest HR 68 bpm   Rest BP 143/70 mmHg   RPE 15    Exercise duration (sec) 0 sec   Percent HR 85 %   Exercise duration (min) 8 min   Estimated workload 10.4 METS   Peak HR 139 bpm   Peak BP 216/66 mmHg   MPHR 163 bpm   SSS 11    SRS 6    SDS 5    LHR 0.38    TID 1.02    LV sys vol 69 mL   LV dias vol 145 62 - 150 mL  Lipid panel     Status: Abnormal   Collection Time: 07/10/18  8:08 AM  Result Value Ref Range   Cholesterol, Total 116 100 - 199 mg/dL   Triglycerides 174 (H)  0 - 149 mg/dL   HDL 26 (L) >39 mg/dL   VLDL Cholesterol Cal 35 5 - 40 mg/dL   LDL Calculated 55 0 - 99 mg/dL   Chol/HDL Ratio 4.5 0.0 - 5.0 ratio    Comment:                                   T. Chol/HDL Ratio                                             Men  Women                               1/2 Avg.Risk  3.4    3.3                                   Avg.Risk  5.0  4.4                                2X Avg.Risk  9.6    7.1                                3X Avg.Risk 23.4   11.0   Hepatic function panel     Status: None   Collection Time: 07/10/18  8:08 AM  Result Value Ref Range   Total Protein 6.3 6.0 - 8.5 g/dL   Albumin 4.3 3.8 - 4.9 g/dL   Bilirubin Total 0.2 0.0 - 1.2 mg/dL   Bilirubin, Direct 0.08 0.00 - 0.40 mg/dL   Alkaline Phosphatase 49 39 - 117 IU/L   AST 23 0 - 40 IU/L   ALT 40 0 - 44 IU/L  Basic metabolic panel     Status: Abnormal   Collection Time: 07/10/18  8:08 AM  Result Value Ref Range   Glucose 238 (H) 65 - 99 mg/dL   BUN 12 6 - 24 mg/dL   Creatinine, Ser 0.61 (L) 0.76 - 1.27 mg/dL   GFR calc non Af Amer 111 >59 mL/min/1.73   GFR calc Af Amer 128 >59 mL/min/1.73   BUN/Creatinine Ratio 20 9 - 20   Sodium 136 134 - 144 mmol/L   Potassium 4.9 3.5 - 5.2 mmol/L   Chloride 100 96 - 106 mmol/L   CO2 21 20 - 29 mmol/L   Calcium 9.2 8.7 - 10.2 mg/dL  PSA     Status: None   Collection Time: 07/10/18  8:08 AM  Result Value Ref Range   Prostate Specific Ag, Serum 0.2 0.0 - 4.0 ng/mL    Comment: Roche ECLIA methodology. According to the American Urological Association, Serum PSA should decrease and remain at undetectable levels after radical prostatectomy. The AUA defines biochemical recurrence as an initial PSA value 0.2 ng/mL or greater followed by a subsequent confirmatory PSA value 0.2 ng/mL or greater. Values obtained with different assay methods or kits cannot be used interchangeably. Results cannot be interpreted as absolute evidence of the presence or  absence of malignant disease.   Microalbumin / creatinine urine ratio     Status: None   Collection Time: 07/10/18  8:08 AM  Result Value Ref Range   Creatinine, Urine 76.8 Not Estab. mg/dL   Microalbumin, Urine <3.0 Not Estab. ug/mL   Microalb/Creat Ratio <4 0 - 29 mg/g creat    Comment:                        Normal:                0 -  29                        Moderately increased: 30 - 300                        Severely increased:       >300               **Please note reference interval change**   POCT glycosylated hemoglobin (Hb A1C)     Status: Abnormal   Collection Time: 07/15/18  9:47 AM  Result Value Ref Range   Hemoglobin A1C 7.9 (A) 4.0 -  5.6 %   HbA1c POC (<> result, manual entry)     HbA1c, POC (prediabetic range)     HbA1c, POC (controlled diabetic range)       Review of Systems No headache, no major weight loss or weight gain, no chest pain no back pain abdominal pain no change in bowel habits complete ROS otherwise negative     Objective:   Physical Exam  Alert and oriented, vitals reviewed and stable, NAD ENT-TM's and ext canals WNL bilat via otoscopic exam Soft palate, tonsils and post pharynx WNL via oropharyngeal exam Neck-symmetric, no masses; thyroid nonpalpable and nontender Pulmonary-no tachypnea or accessory muscle use; Clear without wheezes via auscultation Card--no abnrml murmurs, rhythm reg and rate WNL Carotid pulses symmetric, without bruits       Assessment & Plan:  Impression 1 type 2 diabetes much improved discussed to maintain same approach  2.  Hypertension good control discussed maintain same meds  3.  Hyperlipidemia blood work reviewed good control discussed maintain same  Follow-up in 6 months wellness plus chronic

## 2018-07-17 ENCOUNTER — Ambulatory Visit: Payer: BC Managed Care – PPO | Admitting: Family Medicine

## 2018-08-19 ENCOUNTER — Emergency Department (HOSPITAL_COMMUNITY)
Admission: EM | Admit: 2018-08-19 | Discharge: 2018-08-19 | Disposition: A | Discharge status: Home / Self Care | Payer: BC Managed Care – PPO | Attending: Emergency Medicine | Admitting: Emergency Medicine

## 2018-08-19 ENCOUNTER — Emergency Department (HOSPITAL_COMMUNITY): Payer: BC Managed Care – PPO

## 2018-08-19 ENCOUNTER — Encounter (HOSPITAL_COMMUNITY): Payer: Self-pay | Admitting: Emergency Medicine

## 2018-08-19 ENCOUNTER — Other Ambulatory Visit: Payer: Self-pay

## 2018-08-19 DIAGNOSIS — Z79899 Other long term (current) drug therapy: Secondary | ICD-10-CM | POA: Insufficient documentation

## 2018-08-19 DIAGNOSIS — I10 Essential (primary) hypertension: Secondary | ICD-10-CM | POA: Insufficient documentation

## 2018-08-19 DIAGNOSIS — Z7982 Long term (current) use of aspirin: Secondary | ICD-10-CM | POA: Insufficient documentation

## 2018-08-19 DIAGNOSIS — X509XXA Other and unspecified overexertion or strenuous movements or postures, initial encounter: Secondary | ICD-10-CM | POA: Insufficient documentation

## 2018-08-19 DIAGNOSIS — S82392A Other fracture of lower end of left tibia, initial encounter for closed fracture: Secondary | ICD-10-CM | POA: Diagnosis not present

## 2018-08-19 DIAGNOSIS — Y999 Unspecified external cause status: Secondary | ICD-10-CM | POA: Insufficient documentation

## 2018-08-19 DIAGNOSIS — J449 Chronic obstructive pulmonary disease, unspecified: Secondary | ICD-10-CM | POA: Diagnosis not present

## 2018-08-19 DIAGNOSIS — E119 Type 2 diabetes mellitus without complications: Secondary | ICD-10-CM | POA: Insufficient documentation

## 2018-08-19 DIAGNOSIS — Y93H2 Activity, gardening and landscaping: Secondary | ICD-10-CM | POA: Diagnosis not present

## 2018-08-19 DIAGNOSIS — F17220 Nicotine dependence, chewing tobacco, uncomplicated: Secondary | ICD-10-CM | POA: Diagnosis not present

## 2018-08-19 DIAGNOSIS — S82832A Other fracture of upper and lower end of left fibula, initial encounter for closed fracture: Secondary | ICD-10-CM | POA: Insufficient documentation

## 2018-08-19 DIAGNOSIS — T1490XA Injury, unspecified, initial encounter: Secondary | ICD-10-CM

## 2018-08-19 DIAGNOSIS — Z794 Long term (current) use of insulin: Secondary | ICD-10-CM | POA: Insufficient documentation

## 2018-08-19 DIAGNOSIS — I2510 Atherosclerotic heart disease of native coronary artery without angina pectoris: Secondary | ICD-10-CM | POA: Diagnosis not present

## 2018-08-19 DIAGNOSIS — S99912A Unspecified injury of left ankle, initial encounter: Secondary | ICD-10-CM | POA: Diagnosis present

## 2018-08-19 DIAGNOSIS — Y92007 Garden or yard of unspecified non-institutional (private) residence as the place of occurrence of the external cause: Secondary | ICD-10-CM | POA: Insufficient documentation

## 2018-08-19 MED ORDER — OXYCODONE-ACETAMINOPHEN 5-325 MG PO TABS: 2.0000 | ORAL_TABLET | Freq: Four times a day (QID) | ORAL | 0 refills | Status: DC | PRN

## 2018-08-19 MED ORDER — HYDROMORPHONE HCL 1 MG/ML IJ SOLN
1.0000 mg | Freq: Once | INTRAMUSCULAR | Status: AC
  Administered 2018-08-19: 13:00:00 1 mg via INTRAMUSCULAR
  Filled 2018-08-19: qty 1

## 2018-08-19 MED ORDER — IBUPROFEN 600 MG PO TABS: 600.0000 mg | ORAL_TABLET | Freq: Four times a day (QID) | ORAL | 0 refills | Status: DC | PRN

## 2018-08-19 MED ORDER — KETOROLAC TROMETHAMINE 30 MG/ML IJ SOLN
15.0000 mg | Freq: Once | INTRAMUSCULAR | Status: AC
  Administered 2018-08-19: 13:00:00 15 mg via INTRAVENOUS
  Filled 2018-08-19: qty 1

## 2018-08-19 MED ORDER — LORAZEPAM 1 MG PO TABS
1.0000 mg | ORAL_TABLET | Freq: Once | ORAL | Status: AC
  Administered 2018-08-19: 13:00:00 1 mg via ORAL
  Filled 2018-08-19: qty 1

## 2018-08-19 NOTE — ED Triage Notes (Signed)
Pt was outside cutting trees, fell in a ditch.  C/o of left ankle pain

## 2018-08-19 NOTE — ED Provider Notes (Signed)
[facility] EMERGENCY DEPARTMENT Provider Note   CSN: [id number] Arrival date & time: 08/19/18  1227    History   Chief Complaint Chief Complaint  Patient presents with  . Ankle Pain    HPI Steven Crane is a 58 y.o. male.     HPI   57yM with LLE pain. Pt was doing yard work when stepped into a ditch/depression in the ground. Felt a pop in lower leg and severe pain. Cannot bear weight. Happened just before arrival. Denies any other injuries. NO numbness or tingling.   Past Medical History:  Diagnosis Date  . Arthritis    "hands, back, knees" (08/27/2013)  . Asthma   . CHF (congestive heart failure) ([place]) 02/2010  . Chronic bronchitis ([place])    "get it q year"  . Chronic lower back pain   . Closed head injury 1975  . Coronary artery disease May 2015   s/p successful PTCA/DES x 1 to distal RCA and PTCA/DES x 1 to OM-20 Aug 2013  . DVT (deep venous thrombosis) ([place]) 2008   "LLE"  . GERD (gastroesophageal reflux disease)   . Headache(784.0)    "comes and goes; sometimes qd; sometimes weekly" (08/27/2013)  . Hyperlipemia   . Hypertension   . Hypertriglyceridemia   . LV dysfunction    LVEF 40% at time of cardiac cath May 2015  . Morbid obesity ([place])   . Myocardial infarction ([place]) 02/2010  . Psoriasis   . Type II diabetes mellitus [facility])     Patient Active Problem List   Diagnosis Date Noted  . Unstable angina ([place]) 08/27/2013  . CAD S/P percutaneous coronary angioplasty 2011 08/06/2013  . Chest pain 08/06/2013  . Kidney stone 04/05/2013  . 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 Hyperlipemia 09/25/2012  . Diabetes mellitus without complication ([place]) 14/43/1540  . Atherosclerotic heart disease 09/25/2012  . Essential hypertension, benign 09/25/2012  . Morbid obesity ([place]) 09/25/2012  . Psoriasis 09/25/2012  . History of DVT (deep vein thrombosis) 09/25/2012    Past Surgical History:  Procedure Laterality Date  . COLONOSCOPY  01/06/2012   Procedure: COLONOSCOPY;  Surgeon: [name], MD;  Location: [facility];  Service: Endoscopy;  Laterality: N/A;  7:30 AM  . CORONARY ANGIOPLASTY WITH STENT PLACEMENT  02/2010; 08/27/2013   "1 + 3"   . KNEE ARTHROSCOPY Left 1981  . LEFT HEART CATHETERIZATION WITH CORONARY ANGIOGRAM N/A 08/27/2013   Procedure: LEFT HEART CATHETERIZATION WITH CORONARY ANGIOGRAM;  Surgeon: [name], MD;  Location: [facility] CATH LAB;  Service: Cardiovascular;  Laterality: N/A;        Home Medications    Prior to Admission medications   Medication Sig Start Date End Date Taking? Authorizing Provider  acetaminophen (TYLENOL) 500 MG tablet Take 1,000 mg by mouth 2 (two) times daily.    [provider]  albuterol (PROVENTIL HFA;VENTOLIN HFA) 108 (90 Base) MCG/ACT inhaler Inhale 2 puffs into the lungs every 6 (six) hours as needed for wheezing or shortness of breath. 01/28/17   Kathyrn Drown, MD  ALPRAZolam Duanne Moron) 0.5 MG tablet TAKE ONE-HALF TO ONE TABLET BY MOUTH ONCE DAILY AS NEEDED 04/09/18   [name], MD  amLODipine (NORVASC) 5 MG tablet Take 1 tablet (5 mg total) by mouth daily. 07/15/18   [name], MD  aspirin EC 81 MG tablet Take 1 tablet (81 mg total) by mouth daily. 04/10/18   Herminio Commons, MD  atorvastatin (LIPITOR) 40 MG  tablet Take 1 tablet (40 mg total) by mouth daily. 04/10/18 07/09/18  Herminio Commons, MD  blood glucose meter kit and supplies Test glucose once daily. ICD 10 E11.9 09/26/17   [name], MD  enalapril (VASOTEC) 20 MG tablet Take 1 tablet (20 mg total) by mouth 2 (two) times daily. 07/15/18   [name], MD  fenofibrate 160 MG tablet Take 1 tablet (160 mg total) by mouth daily. 07/15/18   [name], MD  glyBURIDE-metformin (GLUCOVANCE) 5-500 MG tablet Take 2 tablets by mouth 2 (two) times daily. 07/15/18   [name], MD  ibuprofen (ADVIL,MOTRIN) 800 MG tablet Take one up to bid 02/13/16   [name], MD   insulin detemir (LEVEMIR) 100 UNIT/ML injection INJECT 55 units BID 07/15/18   [name], MD  metoprolol succinate (TOPROL-XL) 50 MG 24 hr tablet Take 1 tablet (50 mg total) by mouth daily. Take with or immediately following a meal. 07/15/18   [name], MD  nitroGLYCERIN (NITROSTAT) 0.4 MG SL tablet Place 1 tablet (0.4 mg total) under the tongue every 5 (five) minutes as needed for chest pain. If no relief call 911. 04/09/18   [name], MD  pantoprazole (PROTONIX) 40 MG tablet Take one tablet every day 07/15/18   [name], MD    Family History Family History  Problem Relation Age of Onset  . Coronary artery disease Father   . Diabetes type II Father   . Diabetes Father   . Heart attack Father   . Hypertension Mother     Social History Social History   Tobacco Use  . Smoking status: Never Smoker  . Smokeless tobacco: Current User    Types: Snuff, Chew  . Tobacco comment: "started dipping @ age 41; quit for 5 1/2 yrs at one time; hadn't chewed in awhile"  Substance Use Topics  . Alcohol use: Yes    Alcohol/week: 0.0 standard drinks    Comment: 08/27/2013 "drink beer and liquor; don't drink much"  . Drug use: No     Allergies   Adhesive [tape]   Review of Systems Review of Systems  All systems reviewed and negative, other than as noted in HPI.  Physical Exam Updated Vital Signs BP (!) 163/89   Pulse 81   Temp 97.9 F (36.6 C)   Resp 18   Ht 6' 2" (1.88 m)   Wt 136.1 kg   SpO2 97%   BMI 38.52 kg/m   Physical Exam Vitals signs and nursing note reviewed.  Constitutional:      General: He is not in acute distress.    Appearance: He is well-developed.  HENT:     Head: Normocephalic and atraumatic.  Eyes:     General:        Right eye: No discharge.        Left eye: No discharge.     Conjunctiva/sclera: Conjunctivae normal.  Neck:     Musculoskeletal: Neck supple.  Cardiovascular:     Rate and Rhythm: Normal rate and regular  rhythm.     Heart sounds: Normal heart sounds. No murmur. No friction rub. No gallop.   Pulmonary:     Effort: Pulmonary effort is normal. No respiratory distress.     Breath sounds: Normal breath sounds.  Abdominal:     General: There is no distension.     Palpations: Abdomen is soft.     Tenderness: There is no abdominal tenderness.  Musculoskeletal:        General: Tenderness present.     Comments: Minimal swelling of LLE just above ankle and lateral aspect below knee. TTP distal tibia/fibula and also of proximal fibula. Very good DP pulse. Sensation intact. Able to wiggle toes and plantart/dorsiflex foot although with increased pain. Knee seems NT and stable. No knee effusion.   Skin:    General: Skin is warm and dry.  Neurological:     Mental Status: He is alert.  Psychiatric:        Behavior: Behavior normal.        Thought Content: Thought content normal.      ED Treatments / Results  Labs (all labs ordered are listed, but only abnormal results are displayed) Labs Reviewed - No data to display  EKG None  Radiology Dg Knee 1-2 Views Left  Result Date: 08/19/2018 CLINICAL DATA:  Fall after stepping in hole. EXAM: LEFT KNEE - 1-2 VIEW COMPARISON:  None. FINDINGS: Exam demonstrates moderate degenerative change over the lateral compartment and to lesser extent at over the medial compartment and patellofemoral joints. There is a displaced oblique fracture of the proximal fibular diaphysis as well as partially visualized displaced oblique fracture of the distal tibial diaphysis. 3 cm focus of calcification superior to the patella which may represent a loose body. IMPRESSION: Minimally displaced oblique fracture of the proximal fibular diaphysis and partially visualized fracture of the distal tibial diaphysis. Osteoarthritis. Electronically Signed   By: Marin Olp M.D.   On: 08/19/2018 13:17   Dg Ankle Complete Left  Result Date: 08/19/2018 CLINICAL DATA:  Left ankle pain  after the patient stepped in a hole and fell. EXAM: LEFT ANKLE COMPLETE - 3+ VIEW COMPARISON:  None. FINDINGS: There is a slightly displaced spiral fracture of the distal tibial shaft. The fracture does not appear to extend into the ankle joint. There is slight arthritis at the tibiotalar joint. 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 The distal fibula is intact. IMPRESSION: Slightly displaced spiral fracture of the distal left tibial shaft. Electronically Signed   By: Lorriane Shire M.D.   On: 08/19/2018 12:59    Procedures Procedures (including critical care time)  Medications Ordered in ED Medications  HYDROmorphone (DILAUDID) injection 1 mg (1 mg Intramuscular Given 08/19/18 1310)  ketorolac (TORADOL) 30 MG/ML injection 15 mg (15 mg Intravenous Given 08/19/18 1310)  LORazepam (ATIVAN) tablet 1 mg (1 mg Oral Given 08/19/18 1301)     Initial Impression / Assessment and Plan / ED Course  I have reviewed the triage vital signs and the nursing notes.  Pertinent labs & imaging results that were available during my care of the patient were reviewed by me and considered in my medical decision making (see chart for details).    57yM with LLE pain after mechanical fall. Imaging significant for L distal tibia and proximal fibula fxs. Closed injuries. NVI. Compartments soft. Discussed with Dr Aline Brochure, ortho. Splint, crutches and close outpt FU. PRN pain meds. NWB. Keep elevated. Emergent return precautions discussed.   Final Clinical Impressions(s) / ED Diagnoses   Final diagnoses:  Other closed fracture of distal end of left tibia, initial encounter  Closed fracture of proximal end of left fibula, unspecified fracture morphology, initial encounter    ED Discharge Orders    None       Virgel Manifold, MD 08/20/18 1100

## 2018-08-20 ENCOUNTER — Encounter (HOSPITAL_COMMUNITY): Payer: Self-pay | Admitting: *Deleted

## 2018-08-20 ENCOUNTER — Telehealth: Payer: Self-pay | Admitting: Orthopedic Surgery

## 2018-08-20 ENCOUNTER — Ambulatory Visit: Payer: Self-pay | Admitting: Orthopedic Surgery

## 2018-08-20 ENCOUNTER — Other Ambulatory Visit: Payer: Self-pay

## 2018-08-20 ENCOUNTER — Telehealth: Payer: Self-pay | Admitting: Family Medicine

## 2018-08-20 DIAGNOSIS — S8290XA Unspecified fracture of unspecified lower leg, initial encounter for closed fracture: Secondary | ICD-10-CM

## 2018-08-20 NOTE — Telephone Encounter (Signed)
Patient fell yesterday and broke his leg in 2 places. Went to the ER and the ER doctor told him he would need surgery to place pins. They referred him to Dr. Aline Brochure.  Patient called Dr. Ruthe Mannan office this morning and they said they would have to get the doctor to review the records and call him back.  Patient is in a lot of pain, said he can feel his bones moving around.  He called [facility] and they are going to see him at 2:00.  He wanted to notify us in case he needed a referral with his insurance. [employer].  Please call patient back to let him know what he needs to do next. I told him to ask the ortho doctor about filling out FMLA forms.

## 2018-08-20 NOTE — Progress Notes (Signed)
EKG-04/21/18-epic 05/04/18-Stress Test- epic  04/10/18-Lov- cardiology- epic

## 2018-08-20 NOTE — Telephone Encounter (Signed)
Please go ahead with referral I agree with the patient going ahead to be seen today with orthopedics in [facility]

## 2018-08-20 NOTE — Telephone Encounter (Signed)
FYI

## 2018-08-20 NOTE — Telephone Encounter (Signed)
Patient called to request appointment following [facility] visit - states "broken leg in 2 places"  Please review and advise (notes indicate):  "IMPRESSION:" "Slightly displaced spiral fracture of the distal left tibial shaft"   - patient awaiting Dr's review and advice - ph#[phone]

## 2018-08-20 NOTE — Telephone Encounter (Signed)
Patient's son called back and said they had found another orthopedist that could see him today.

## 2018-08-20 NOTE — Telephone Encounter (Signed)
Any day next week.

## 2018-08-20 NOTE — Anesthesia Preprocedure Evaluation (Addendum)
Anesthesia Evaluation  Patient identified by MRN, date of birth, ID band Patient awake    Reviewed: Allergy & Precautions, NPO status , Patient's Chart, lab work & pertinent test results  Airway Mallampati: II  TM Distance: >3 FB     Dental  (+) Teeth Intact, Poor Dentition, Dental Advisory Given,    Pulmonary    breath sounds clear to auscultation       Cardiovascular hypertension,  Rhythm:Regular Rate:Normal     Neuro/Psych    GI/Hepatic   Endo/Other  diabetes  Renal/GU      Musculoskeletal   Abdominal   Peds  Hematology   Anesthesia Other Findings   Reproductive/Obstetrics                            Anesthesia Physical Anesthesia Plan  ASA: III  Anesthesia Plan: General   Post-op Pain Management:    Induction: Intravenous  PONV Risk Score and Plan: Ondansetron  Airway Management Planned: Oral ETT  Additional Equipment:   Intra-op Plan:   Post-operative Plan: Extubation in OR  Informed Consent: I have reviewed the patients History and Physical, chart, labs and discussed the procedure including the risks, benefits and alternatives for the proposed anesthesia with the patient or authorized representative who has indicated his/her understanding and acceptance.     Dental advisory given  Plan Discussed with: CRNA and Anesthesiologist  Anesthesia Plan Comments: (See PAT note 08/20/2018, Konrad Felix, PA-C)       Anesthesia Quick Evaluation

## 2018-08-20 NOTE — Progress Notes (Signed)
Need orders in epic.  Thank you.

## 2018-08-20 NOTE — Progress Notes (Signed)
Anesthesia Chart Review   Case:  [id number] Date/Time:  08/21/18 0845   Procedure:  INTRAMEDULLARY (IM) NAIL TIBIAL (Left )   Anesthesia type:  Choice   Pre-op diagnosis:  left tibia fracture   Location:  [facility] ROOM 03 / [facility]   Surgeon:  Rod Can, MD      DISCUSSION:58 yo never smoker with h/o anxiety, depression, CAD (MI 2011, DES x2 2015) HTN, DVT (LLE 2008), hyperlipemia, DM II, chronic low back pain, left tibia fracture scheduled for above procedure 08/21/18 with Dr. Rod Can.   Pt suffered left tibia fracture yesterday, 08/19/18, after stepping into a ditch/depression in the ground.  Seen in ED and referred to ortho.    Pt last seen by cardiologist, Dr. Kate Sable, 04/10/18.  Stress test ordered at this visit completed 04/24/2018 with no evidence of ischemia, low risk study.    Pt can proceed with planned procedure barring acute status change and after evaluation DOS (SDW).  VS: There were no vitals taken for this visit.  PROVIDERS: [name], MD is PCP last seen 07/15/18  Kate Sable, MD is Cardiologist  LABS: DOS (all labs ordered are listed, but only abnormal results are displayed)  Labs Reviewed - No data to display   IMAGES: DG Knee 1-2 Views Left 08/19/18 IMPRESSION: Minimally displaced oblique fracture of the proximal fibular diaphysis and partially visualized fracture of the distal tibial diaphysis.  DG Ankle Complete Left 08/19/2018 IMPRESSION: Slightly displaced spiral fracture of the distal left tibial shaft.  Carotid Doppler 11/14/2016 IMPRESSION: 1. Mild diffuse bilateral carotid intimal thickening. No significant stenosis.  2. Vertebrals are patent with antegrade flow.  EKG: 04/21/18 Rate 78 bpm No acute ST-T changes.  CV: Myocardial Perfusion 04/24/2018  Clinically positive, electrically negative for ischemia.  Small to moderate region of decreased tracer activity consistent with probable soft tissue attenuation  (diaphragm), cannot completely exclude subendocardial scar.  Nuclear stress EF: 51%.  This is a low risk study.  Echo 08/12/2013 Study Conclusions  - Left ventricle: The cavity size was normal. Wall thickness was increased in a pattern of moderate LVH. Systolic function was normal. The estimated ejection fraction was in the range of 55% to 60%. There is hypokinesis of the basal-midinferolateral myocardium. Doppler parameters are consistent with abnormal left ventricular relaxation (grade 1 diastolic dysfunction). - Aortic valve: Trivial regurgitation. - Mitral valve: Calcified annulus. Trivial regurgitation. - Right atrium: Central venous pressure: 59m Hg (est). - Tricuspid valve: Trivial regurgitation. - Pulmonary arteries: PA peak pressure: 267mHg (S). - Pericardium, extracardiac: There was no pericardial effusion. Impressions:  - Normal LV chamber size with moderate LVH and LVEF 55-60%, mid to basal inferolateral hypokinesis, grade 1 diastolic dysfunction. Normal RV function. PASP 23 mmHg. Past Medical History:  Diagnosis Date  . Anxiety   . Arthritis    "hands, back, knees" (08/27/2013)  . Asthma   . CHF (congestive heart failure) ([facility]/2011  . Chronic bronchitis ([place]   "get it q year"  . Chronic lower back pain   . Closed head injury 1975  . Coronary artery disease May 2015   s/p successful PTCA/DES x 1 to distal RCA and PTCA/DES x 1 to OM-20 Aug 2013  . Depression   . DVT (deep venous thrombosis) ([facility]   "LLE"  . GERD (gastroesophageal reflux disease)   . Headache    hs of but no longer has problems   . History of kidney stones   . Hyperlipemia   .  Hypertension   . Hypertriglyceridemia   . LV dysfunction    LVEF 40% at time of cardiac cath May 2015  . Morbid obesity ([place])   . Myocardial infarction ([place]) 02/2010  . Psoriasis   . Type II diabetes mellitus ([place])     Past Surgical History:  Procedure Laterality Date  .  COLONOSCOPY  01/06/2012   Procedure: COLONOSCOPY;  Surgeon: [name], MD;  Location[facility];  Service: Endoscopy;  Laterality: N/A;  7:30 AM  . CORONARY ANGIOPLASTY WITH STENT PLACEMENT  02/2010; 08/27/2013   "1 + 3"   . KNEE ARTHROSCOPY Left 1981  . LEFT HEART CATHETERIZATION WITH CORONARY ANGIOGRAM N/A 08/27/2013   Procedure: LEFT HEART CATHETERIZATION WITH CORONARY ANGIOGRAM;  Surgeon: [name], MD;  Location: [facility] CATH LAB;  Service: Cardiovascular;  Laterality: N/A;  . right wrist fracture surgery       MEDICATIONS: No current facility-administered medications for this encounter.    Marland Kitchen acetaminophen (TYLENOL) 500 MG tablet  . albuterol (PROVENTIL HFA;VENTOLIN HFA) 108 (90 Base) MCG/ACT inhaler  . ALPRAZolam (XANAX) 0.5 MG tablet  . amLODipine (NORVASC) 5 MG tablet  . aspirin EC 81 MG tablet  . atorvastatin (LIPITOR) 40 MG tablet  . enalapril (VASOTEC) 20 MG tablet  . fenofibrate 160 MG tablet  . glyBURIDE-metformin (GLUCOVANCE) 5-500 MG tablet  . ibuprofen (ADVIL) 600 MG tablet  . insulin detemir (LEVEMIR) 100 UNIT/ML injection  . metoprolol succinate (TOPROL-XL) 50 MG 24 hr tablet  . nitroGLYCERIN (NITROSTAT) 0.4 MG SL tablet  . oxyCODONE-acetaminophen (PERCOCET/ROXICET) 5-325 MG tablet  . pantoprazole (PROTONIX) 40 MG tablet  . blood glucose meter kit and supplies    Maia Plan [facility] Pre-Surgical Testing [phone] 08/20/18 2:31 PM

## 2018-08-20 NOTE — Progress Notes (Signed)
SPOKE W/  _patient      SCREENING SYMPTOMS OF COVID 19:   COUGH-- no RUNNY NOSE--- no  SORE THROAT---no  NASAL CONGESTION----no  SNEEZING----no  SHORTNESS OF BREATH--- no DIFFICULTY BREATHING---no  TEMP >100.0 -----no  UNEXPLAINED BODY ACHES-----no-  CHILLS -------- no  HEADACHES ---------no  LOSS OF SMELL/ TASTE --------no    HAVE YOU OR ANY FAMILY MEMBER TRAVELLED PAST 14 DAYS OUT OF THE   COUNTY---no ( just to come to [place])  STATE----no COUNTRY----no  HAVE YOU OR ANY FAMILY MEMBER BEEN EXPOSED TO ANYONE WITH COVID 19?  no

## 2018-08-20 NOTE — Telephone Encounter (Signed)
Referral placed. Left message to return call 

## 2018-08-21 ENCOUNTER — Encounter (HOSPITAL_COMMUNITY): Payer: Self-pay | Admitting: General Practice

## 2018-08-21 ENCOUNTER — Other Ambulatory Visit: Payer: Self-pay

## 2018-08-21 ENCOUNTER — Inpatient Hospital Stay (HOSPITAL_COMMUNITY): Payer: BC Managed Care – PPO | Admitting: Physician Assistant

## 2018-08-21 ENCOUNTER — Inpatient Hospital Stay (HOSPITAL_COMMUNITY): Payer: BC Managed Care – PPO

## 2018-08-21 ENCOUNTER — Encounter (HOSPITAL_COMMUNITY)
Admission: RE | Disposition: A | Discharge status: Home Health | Payer: Self-pay | Source: Home / Self Care | Attending: Orthopedic Surgery

## 2018-08-21 ENCOUNTER — Inpatient Hospital Stay (HOSPITAL_COMMUNITY)
Admission: RE | Admit: 2018-08-21 | Discharge: 2018-08-22 | DRG: 494 | Disposition: A | Discharge status: Home Health | Payer: BC Managed Care – PPO | Attending: Orthopedic Surgery | Admitting: Orthopedic Surgery

## 2018-08-21 ENCOUNTER — Observation Stay (HOSPITAL_COMMUNITY): Payer: BC Managed Care – PPO

## 2018-08-21 DIAGNOSIS — Z955 Presence of coronary angioplasty implant and graft: Secondary | ICD-10-CM

## 2018-08-21 DIAGNOSIS — S82232A Displaced oblique fracture of shaft of left tibia, initial encounter for closed fracture: Secondary | ICD-10-CM | POA: Diagnosis present

## 2018-08-21 DIAGNOSIS — Z833 Family history of diabetes mellitus: Secondary | ICD-10-CM

## 2018-08-21 DIAGNOSIS — E785 Hyperlipidemia, unspecified: Secondary | ICD-10-CM | POA: Diagnosis present

## 2018-08-21 DIAGNOSIS — Z7982 Long term (current) use of aspirin: Secondary | ICD-10-CM | POA: Diagnosis not present

## 2018-08-21 DIAGNOSIS — S82492A Other fracture of shaft of left fibula, initial encounter for closed fracture: Secondary | ICD-10-CM | POA: Diagnosis present

## 2018-08-21 DIAGNOSIS — Z419 Encounter for procedure for purposes other than remedying health state, unspecified: Secondary | ICD-10-CM

## 2018-08-21 DIAGNOSIS — Z91048 Other nonmedicinal substance allergy status: Secondary | ICD-10-CM | POA: Diagnosis not present

## 2018-08-21 DIAGNOSIS — F419 Anxiety disorder, unspecified: Secondary | ICD-10-CM | POA: Diagnosis present

## 2018-08-21 DIAGNOSIS — Z86718 Personal history of other venous thrombosis and embolism: Secondary | ICD-10-CM | POA: Diagnosis not present

## 2018-08-21 DIAGNOSIS — E119 Type 2 diabetes mellitus without complications: Secondary | ICD-10-CM

## 2018-08-21 DIAGNOSIS — X58XXXA Exposure to other specified factors, initial encounter: Secondary | ICD-10-CM | POA: Diagnosis present

## 2018-08-21 DIAGNOSIS — Z79899 Other long term (current) drug therapy: Secondary | ICD-10-CM | POA: Diagnosis not present

## 2018-08-21 DIAGNOSIS — G8929 Other chronic pain: Secondary | ICD-10-CM | POA: Diagnosis present

## 2018-08-21 DIAGNOSIS — I252 Old myocardial infarction: Secondary | ICD-10-CM

## 2018-08-21 DIAGNOSIS — Z794 Long term (current) use of insulin: Secondary | ICD-10-CM

## 2018-08-21 DIAGNOSIS — I2510 Atherosclerotic heart disease of native coronary artery without angina pectoris: Secondary | ICD-10-CM | POA: Diagnosis present

## 2018-08-21 DIAGNOSIS — K219 Gastro-esophageal reflux disease without esophagitis: Secondary | ICD-10-CM | POA: Diagnosis present

## 2018-08-21 DIAGNOSIS — Z720 Tobacco use: Secondary | ICD-10-CM

## 2018-08-21 DIAGNOSIS — Z8249 Family history of ischemic heart disease and other diseases of the circulatory system: Secondary | ICD-10-CM | POA: Diagnosis not present

## 2018-08-21 DIAGNOSIS — E781 Pure hyperglyceridemia: Secondary | ICD-10-CM | POA: Diagnosis present

## 2018-08-21 DIAGNOSIS — S82242A Displaced spiral fracture of shaft of left tibia, initial encounter for closed fracture: Principal | ICD-10-CM | POA: Diagnosis present

## 2018-08-21 DIAGNOSIS — I10 Essential (primary) hypertension: Secondary | ICD-10-CM | POA: Diagnosis present

## 2018-08-21 DIAGNOSIS — S82252A Displaced comminuted fracture of shaft of left tibia, initial encounter for closed fracture: Secondary | ICD-10-CM | POA: Diagnosis present

## 2018-08-21 DIAGNOSIS — F329 Major depressive disorder, single episode, unspecified: Secondary | ICD-10-CM | POA: Diagnosis present

## 2018-08-21 HISTORY — DX: Personal history of urinary calculi: Z87.442

## 2018-08-21 HISTORY — PX: TIBIA IM NAIL INSERTION: SHX2516

## 2018-08-21 HISTORY — DX: Major depressive disorder, single episode, unspecified: F32.9

## 2018-08-21 HISTORY — DX: Headache: R51

## 2018-08-21 HISTORY — DX: Anxiety disorder, unspecified: F41.9

## 2018-08-21 HISTORY — DX: Depression, unspecified: F32.A

## 2018-08-21 HISTORY — DX: Headache, unspecified: R51.9

## 2018-08-21 LAB — CBC
HCT: 39.3 % (ref 39.0–52.0)
Hemoglobin: 12.8 g/dL — ABNORMAL LOW (ref 13.0–17.0)
MCH: 28.3 pg (ref 26.0–34.0)
MCHC: 32.6 g/dL (ref 30.0–36.0)
MCV: 86.9 fL (ref 80.0–100.0)
Platelets: 250 10*3/uL (ref 150–400)
RBC: 4.52 MIL/uL (ref 4.22–5.81)
RDW: 13.3 % (ref 11.5–15.5)
WBC: 7.5 10*3/uL (ref 4.0–10.5)
nRBC: 0 % (ref 0.0–0.2)

## 2018-08-21 LAB — BASIC METABOLIC PANEL
Anion gap: 7 (ref 5–15)
BUN: 15 mg/dL (ref 6–20)
CO2: 23 mmol/L (ref 22–32)
Calcium: 8.6 mg/dL — ABNORMAL LOW (ref 8.9–10.3)
Chloride: 105 mmol/L (ref 98–111)
Creatinine, Ser: 0.68 mg/dL (ref 0.61–1.24)
GFR calc Af Amer: >60 mL/min (ref >60)
GFR calc non Af Amer: >60 mL/min (ref >60)
Glucose, Bld: 188 mg/dL — ABNORMAL HIGH (ref 70–99)
Potassium: 4.1 mmol/L (ref 3.5–5.1)
Sodium: 135 mmol/L (ref 135–145)

## 2018-08-21 LAB — HEMOGLOBIN A1C
Hgb A1c MFr Bld: 8.9 % — ABNORMAL HIGH (ref 4.8–5.6)
Mean Plasma Glucose: 208.73 mg/dL

## 2018-08-21 LAB — GLUCOSE, CAPILLARY
Glucose-Capillary: 185 mg/dL — ABNORMAL HIGH (ref 70–99)
Glucose-Capillary: 190 mg/dL — ABNORMAL HIGH (ref 70–99)
Glucose-Capillary: 224 mg/dL — ABNORMAL HIGH (ref 70–99)
Glucose-Capillary: 175 mg/dL — ABNORMAL HIGH (ref 70–99)

## 2018-08-21 LAB — APTT: aPTT: 38 seconds — ABNORMAL HIGH (ref 24–36)

## 2018-08-21 LAB — PROTIME-INR
INR: 0.9 (ref 0.8–1.2)
Prothrombin Time: 12.5 seconds (ref 11.4–15.2)

## 2018-08-21 SURGERY — INSERTION, INTRAMEDULLARY ROD, TIBIA
Anesthesia: General | Site: Knee | Laterality: Left

## 2018-08-21 MED ORDER — DOCUSATE SODIUM 100 MG PO CAPS: 100.0000 mg | ORAL_CAPSULE | Freq: Two times a day (BID) | ORAL | 3 refills | Status: DC

## 2018-08-21 MED ORDER — SUCCINYLCHOLINE CHLORIDE 200 MG/10ML IV SOSY
PREFILLED_SYRINGE | INTRAVENOUS | Status: DC | PRN
  Administered 2018-08-21: 160 mg via INTRAVENOUS

## 2018-08-21 MED ORDER — PROPOFOL 10 MG/ML IV BOLUS
INTRAVENOUS | Status: DC | PRN
  Administered 2018-08-21: 200 mg via INTRAVENOUS
  Administered 2018-08-21: 20 mg via INTRAVENOUS

## 2018-08-21 MED ORDER — DEXMEDETOMIDINE HCL 200 MCG/2ML IV SOLN
40.0000 ug | Freq: Once | INTRAVENOUS | Status: AC
  Administered 2018-08-21: 15:00:00 40 ug via INTRAVENOUS
  Filled 2018-08-21: qty 0.4

## 2018-08-21 MED ORDER — METOCLOPRAMIDE HCL 5 MG PO TABS: 5.0000 mg | ORAL_TABLET | Freq: Three times a day (TID) | ORAL | Status: DC | PRN

## 2018-08-21 MED ORDER — ATORVASTATIN CALCIUM 40 MG PO TABS
40.0000 mg | ORAL_TABLET | Freq: Every day | ORAL | Status: DC
  Administered 2018-08-22: 11:00:00 40 mg via ORAL
  Filled 2018-08-21: qty 1

## 2018-08-21 MED ORDER — SENNA 8.6 MG PO TABS: 2.0000 | ORAL_TABLET | Freq: Every day | ORAL | 3 refills | Status: DC

## 2018-08-21 MED ORDER — ENALAPRIL MALEATE 10 MG PO TABS
20.0000 mg | ORAL_TABLET | Freq: Two times a day (BID) | ORAL | Status: DC
  Administered 2018-08-21 – 2018-08-22 (×2): 20 mg via ORAL
  Filled 2018-08-21 (×2): qty 2

## 2018-08-21 MED ORDER — SENNA 8.6 MG PO TABS
1.0000 | ORAL_TABLET | Freq: Two times a day (BID) | ORAL | Status: DC
  Administered 2018-08-21 – 2018-08-22 (×2): 8.6 mg via ORAL
  Filled 2018-08-21 (×2): qty 1

## 2018-08-21 MED ORDER — ONDANSETRON HCL 4 MG/2ML IJ SOLN: 4.0000 mg | Freq: Four times a day (QID) | INTRAMUSCULAR | Status: DC | PRN

## 2018-08-21 MED ORDER — NITROGLYCERIN 0.4 MG SL SUBL: 0.4000 mg | SUBLINGUAL_TABLET | SUBLINGUAL | Status: DC | PRN

## 2018-08-21 MED ORDER — ENOXAPARIN SODIUM 40 MG/0.4ML ~~LOC~~ SOLN: 1.0000 mg/kg | Freq: Two times a day (BID) | SUBCUTANEOUS | 11 refills | Status: DC

## 2018-08-21 MED ORDER — OXYCODONE HCL 5 MG PO TABS
10.0000 mg | ORAL_TABLET | ORAL | Status: DC | PRN
  Administered 2018-08-21 – 2018-08-22 (×3): 15 mg via ORAL
  Filled 2018-08-21 (×3): qty 3

## 2018-08-21 MED ORDER — METOPROLOL SUCCINATE ER 50 MG PO TB24
50.0000 mg | ORAL_TABLET | Freq: Every day | ORAL | Status: DC
  Administered 2018-08-22: 11:00:00 50 mg via ORAL
  Filled 2018-08-21: qty 1

## 2018-08-21 MED ORDER — FENTANYL CITRATE (PF) 100 MCG/2ML IJ SOLN
25.0000 ug | INTRAMUSCULAR | Status: DC | PRN
  Administered 2018-08-21 (×4): 50 ug via INTRAVENOUS

## 2018-08-21 MED ORDER — GLYBURIDE-METFORMIN 5-500 MG PO TABS: 2.0000 | ORAL_TABLET | Freq: Two times a day (BID) | ORAL | Status: DC

## 2018-08-21 MED ORDER — OXYCODONE HCL 5 MG/5ML PO SOLN: 5.0000 mg | Freq: Once | ORAL | Status: DC | PRN

## 2018-08-21 MED ORDER — INSULIN DETEMIR 100 UNIT/ML ~~LOC~~ SOLN
55.0000 [IU] | Freq: Two times a day (BID) | SUBCUTANEOUS | Status: DC
  Administered 2018-08-21 – 2018-08-22 (×2): 55 [IU] via SUBCUTANEOUS
  Filled 2018-08-21 (×3): qty 0.55

## 2018-08-21 MED ORDER — OXYCODONE HCL 5 MG PO TABS: 5.0000 mg | ORAL_TABLET | ORAL | Status: DC | PRN

## 2018-08-21 MED ORDER — FENOFIBRATE 160 MG PO TABS
160.0000 mg | ORAL_TABLET | Freq: Every day | ORAL | Status: DC
  Administered 2018-08-22: 11:00:00 160 mg via ORAL
  Filled 2018-08-21: qty 1

## 2018-08-21 MED ORDER — POVIDONE-IODINE 10 % EX SWAB
2.0000 "application " | Freq: Once | CUTANEOUS | Status: AC
  Administered 2018-08-21: 2 via TOPICAL

## 2018-08-21 MED ORDER — ONDANSETRON HCL 4 MG/2ML IJ SOLN: 4.0000 mg | Freq: Once | INTRAMUSCULAR | Status: DC | PRN

## 2018-08-21 MED ORDER — METHOCARBAMOL 500 MG PO TABS
500.0000 mg | ORAL_TABLET | Freq: Four times a day (QID) | ORAL | Status: DC | PRN
  Administered 2018-08-21 – 2018-08-22 (×2): 500 mg via ORAL
  Filled 2018-08-21 (×3): qty 1

## 2018-08-21 MED ORDER — LACTATED RINGERS IV SOLN
INTRAVENOUS | Status: DC
  Administered 2018-08-21 (×2): via INTRAVENOUS

## 2018-08-21 MED ORDER — METHOCARBAMOL 1000 MG/10ML IJ SOLN
500.0000 mg | Freq: Four times a day (QID) | INTRAVENOUS | Status: DC | PRN
  Administered 2018-08-21: 14:00:00 500 mg via INTRAVENOUS
  Filled 2018-08-21: qty 500
  Filled 2018-08-21: qty 5

## 2018-08-21 MED ORDER — ONDANSETRON HCL 4 MG/2ML IJ SOLN
INTRAMUSCULAR | Status: AC
  Filled 2018-08-21: qty 2

## 2018-08-21 MED ORDER — METOPROLOL SUCCINATE ER 50 MG PO TB24
50.0000 mg | ORAL_TABLET | ORAL | Status: AC
  Administered 2018-08-21: 08:00:00 50 mg via ORAL
  Filled 2018-08-21: qty 1

## 2018-08-21 MED ORDER — AMLODIPINE BESYLATE 5 MG PO TABS
5.0000 mg | ORAL_TABLET | Freq: Every day | ORAL | Status: DC
  Administered 2018-08-21 – 2018-08-22 (×2): 5 mg via ORAL
  Filled 2018-08-21 (×2): qty 1

## 2018-08-21 MED ORDER — INSULIN ASPART 100 UNIT/ML ~~LOC~~ SOLN
0.0000 [IU] | Freq: Three times a day (TID) | SUBCUTANEOUS | Status: DC
  Administered 2018-08-21: 18:00:00 5 [IU] via SUBCUTANEOUS
  Administered 2018-08-22: 12:00:00 3 [IU] via SUBCUTANEOUS

## 2018-08-21 MED ORDER — KETOROLAC TROMETHAMINE 15 MG/ML IJ SOLN
15.0000 mg | Freq: Four times a day (QID) | INTRAMUSCULAR | Status: DC
  Administered 2018-08-21 – 2018-08-22 (×3): 15 mg via INTRAVENOUS
  Filled 2018-08-21 (×4): qty 1

## 2018-08-21 MED ORDER — DOCUSATE SODIUM 100 MG PO CAPS
100.0000 mg | ORAL_CAPSULE | Freq: Two times a day (BID) | ORAL | Status: DC
  Administered 2018-08-21 – 2018-08-22 (×2): 100 mg via ORAL
  Filled 2018-08-21 (×2): qty 1

## 2018-08-21 MED ORDER — GLYBURIDE 5 MG PO TABS
5.0000 mg | ORAL_TABLET | Freq: Two times a day (BID) | ORAL | Status: DC
  Administered 2018-08-22: 08:00:00 5 mg via ORAL
  Filled 2018-08-21: qty 1

## 2018-08-21 MED ORDER — ONDANSETRON HCL 4 MG PO TABS: 4.0000 mg | ORAL_TABLET | Freq: Three times a day (TID) | ORAL | 0 refills | Status: DC | PRN

## 2018-08-21 MED ORDER — METFORMIN HCL 500 MG PO TABS
500.0000 mg | ORAL_TABLET | Freq: Two times a day (BID) | ORAL | Status: DC
  Administered 2018-08-22: 08:00:00 500 mg via ORAL
  Filled 2018-08-21: qty 1

## 2018-08-21 MED ORDER — FENTANYL CITRATE (PF) 100 MCG/2ML IJ SOLN
INTRAMUSCULAR | Status: AC
  Filled 2018-08-21: qty 4

## 2018-08-21 MED ORDER — SUCCINYLCHOLINE CHLORIDE 200 MG/10ML IV SOSY
PREFILLED_SYRINGE | INTRAVENOUS | Status: AC
  Filled 2018-08-21: qty 10

## 2018-08-21 MED ORDER — METOCLOPRAMIDE HCL 5 MG/ML IJ SOLN: 5.0000 mg | Freq: Three times a day (TID) | INTRAMUSCULAR | Status: DC | PRN

## 2018-08-21 MED ORDER — PROPOFOL 10 MG/ML IV BOLUS
INTRAVENOUS | Status: AC
  Filled 2018-08-21: qty 20

## 2018-08-21 MED ORDER — LIDOCAINE 2% (20 MG/ML) 5 ML SYRINGE
INTRAMUSCULAR | Status: AC
  Filled 2018-08-21: qty 5

## 2018-08-21 MED ORDER — FENTANYL CITRATE (PF) 250 MCG/5ML IJ SOLN
INTRAMUSCULAR | Status: AC
  Filled 2018-08-21: qty 5

## 2018-08-21 MED ORDER — CHLORHEXIDINE GLUCONATE 4 % EX LIQD: 60.0000 mL | Freq: Once | CUTANEOUS | Status: DC

## 2018-08-21 MED ORDER — LIDOCAINE 2% (20 MG/ML) 5 ML SYRINGE
INTRAMUSCULAR | Status: DC | PRN
  Administered 2018-08-21: 100 mg via INTRAVENOUS

## 2018-08-21 MED ORDER — HYDROMORPHONE HCL 1 MG/ML IJ SOLN
0.5000 mg | INTRAMUSCULAR | Status: DC | PRN
  Administered 2018-08-21 – 2018-08-22 (×3): 1 mg via INTRAVENOUS
  Filled 2018-08-21 (×3): qty 1

## 2018-08-21 MED ORDER — CEFAZOLIN SODIUM-DEXTROSE 2-4 GM/100ML-% IV SOLN
2.0000 g | Freq: Four times a day (QID) | INTRAVENOUS | Status: AC
  Administered 2018-08-21 – 2018-08-22 (×3): 2 g via INTRAVENOUS
  Filled 2018-08-21 (×3): qty 100

## 2018-08-21 MED ORDER — DEXTROSE 5 % IV SOLN
3.0000 g | INTRAVENOUS | Status: AC
  Administered 2018-08-21: 3 g via INTRAVENOUS
  Filled 2018-08-21: qty 3

## 2018-08-21 MED ORDER — ONDANSETRON HCL 4 MG/2ML IJ SOLN
INTRAMUSCULAR | Status: DC | PRN
  Administered 2018-08-21: 4 mg via INTRAVENOUS

## 2018-08-21 MED ORDER — OXYCODONE HCL 5 MG PO TABS: 10.0000 mg | ORAL_TABLET | ORAL | 0 refills | Status: DC | PRN

## 2018-08-21 MED ORDER — DEXMEDETOMIDINE HCL 200 MCG/2ML IV SOLN
40.0000 ug | Freq: Once | INTRAVENOUS | Status: AC
  Administered 2018-08-21: 15:00:00 40 ug via INTRAVENOUS

## 2018-08-21 MED ORDER — ALBUTEROL SULFATE (2.5 MG/3ML) 0.083% IN NEBU: 2.5000 mg | INHALATION_SOLUTION | Freq: Four times a day (QID) | RESPIRATORY_TRACT | Status: DC | PRN

## 2018-08-21 MED ORDER — ASPIRIN EC 81 MG PO TBEC
81.0000 mg | DELAYED_RELEASE_TABLET | Freq: Every day | ORAL | Status: DC
  Administered 2018-08-22: 11:00:00 81 mg via ORAL
  Filled 2018-08-21: qty 1

## 2018-08-21 MED ORDER — MIDAZOLAM HCL 5 MG/5ML IJ SOLN
INTRAMUSCULAR | Status: DC | PRN
  Administered 2018-08-21: 2 mg via INTRAVENOUS

## 2018-08-21 MED ORDER — STERILE WATER FOR IRRIGATION IR SOLN
Status: DC | PRN
  Administered 2018-08-21: 2000 mL

## 2018-08-21 MED ORDER — OXYCODONE HCL 5 MG PO TABS: 5.0000 mg | ORAL_TABLET | Freq: Once | ORAL | Status: DC | PRN

## 2018-08-21 MED ORDER — ENOXAPARIN SODIUM 40 MG/0.4ML ~~LOC~~ SOLN
40.0000 mg | SUBCUTANEOUS | Status: DC
  Administered 2018-08-22: 08:00:00 40 mg via SUBCUTANEOUS
  Filled 2018-08-21: qty 0.4

## 2018-08-21 MED ORDER — ACETAMINOPHEN 325 MG PO TABS
325.0000 mg | ORAL_TABLET | Freq: Four times a day (QID) | ORAL | Status: DC | PRN
  Administered 2018-08-22: 11:00:00 650 mg via ORAL
  Filled 2018-08-21: qty 2

## 2018-08-21 MED ORDER — ALPRAZOLAM 0.25 MG PO TABS: 0.2500 mg | ORAL_TABLET | Freq: Every day | ORAL | Status: DC | PRN

## 2018-08-21 MED ORDER — FENTANYL CITRATE (PF) 100 MCG/2ML IJ SOLN
INTRAMUSCULAR | Status: DC | PRN
  Administered 2018-08-21: 100 ug via INTRAVENOUS
  Administered 2018-08-21 (×3): 50 ug via INTRAVENOUS

## 2018-08-21 MED ORDER — PANTOPRAZOLE SODIUM 40 MG PO TBEC
40.0000 mg | DELAYED_RELEASE_TABLET | Freq: Every day | ORAL | Status: DC
  Administered 2018-08-21 – 2018-08-22 (×2): 40 mg via ORAL
  Filled 2018-08-21 (×2): qty 1

## 2018-08-21 MED ORDER — MIDAZOLAM HCL 2 MG/2ML IJ SOLN
INTRAMUSCULAR | Status: AC
  Filled 2018-08-21: qty 2

## 2018-08-21 MED ORDER — SODIUM CHLORIDE 0.9 % IV SOLN
INTRAVENOUS | Status: DC
  Administered 2018-08-21: 15:00:00 via INTRAVENOUS

## 2018-08-21 MED ORDER — POLYETHYLENE GLYCOL 3350 17 G PO PACK: 17.0000 g | PACK | Freq: Every day | ORAL | Status: DC | PRN

## 2018-08-21 MED ORDER — ONDANSETRON HCL 4 MG PO TABS: 4.0000 mg | ORAL_TABLET | Freq: Four times a day (QID) | ORAL | Status: DC | PRN

## 2018-08-21 MED ORDER — SODIUM CHLORIDE 0.9 % IR SOLN
Status: DC | PRN
  Administered 2018-08-21: 1000 mL

## 2018-08-21 SURGICAL SUPPLY — 63 items
ADH SKN CLS APL DERMABOND .7 (GAUZE/BANDAGES/DRESSINGS) ×2
APL PRP STRL LF DISP 70% ISPRP (MISCELLANEOUS) ×2
BAG SPEC THK2 15X12 ZIP CLS (MISCELLANEOUS) ×1
BAG ZIPLOCK 12X15 (MISCELLANEOUS) ×3 IMPLANT
BANDAGE ACE 4X5 VEL STRL LF (GAUZE/BANDAGES/DRESSINGS) ×3 IMPLANT
BANDAGE ACE 6X5 VEL STRL LF (GAUZE/BANDAGES/DRESSINGS) ×3 IMPLANT
BIT DRILL 3.8X6 NS (BIT) ×3 IMPLANT
BIT DRILL 4.4 NS (BIT) ×2 IMPLANT
BONE SCREW CORTICAL 65MM (Screw) ×1 IMPLANT
CHLORAPREP W/TINT 26 (MISCELLANEOUS) ×6 IMPLANT
COVER SURGICAL LIGHT HANDLE (MISCELLANEOUS) ×3 IMPLANT
COVER WAND RF STERILE (DRAPES) ×3 IMPLANT
CUFF TOURN SGL QUICK 34 (TOURNIQUET CUFF)
CUFF TRNQT CYL 34X4.125X (TOURNIQUET CUFF) ×1 IMPLANT
DERMABOND ADVANCED (GAUZE/BANDAGES/DRESSINGS) ×4
DERMABOND ADVANCED .7 DNX12 (GAUZE/BANDAGES/DRESSINGS) ×2 IMPLANT
DRAPE C-ARM 42X120 X-RAY (DRAPES) ×3 IMPLANT
DRAPE C-ARMOR (DRAPES) ×3 IMPLANT
DRAPE POUCH INSTRU U-SHP 10X18 (DRAPES) ×3 IMPLANT
DRAPE SHEET LG 3/4 BI-LAMINATE (DRAPES) ×9 IMPLANT
DRAPE U-SHAPE 47X51 STRL (DRAPES) ×3 IMPLANT
DRSG ADAPTIC 3X8 NADH LF (GAUZE/BANDAGES/DRESSINGS) ×2 IMPLANT
DRSG EMULSION OIL 3X3 NADH (GAUZE/BANDAGES/DRESSINGS) IMPLANT
DRSG MEPILEX BORDER 4X4 (GAUZE/BANDAGES/DRESSINGS) IMPLANT
DRSG MEPILEX BORDER 4X8 (GAUZE/BANDAGES/DRESSINGS) IMPLANT
ELECT REM PT RETURN 15FT ADLT (MISCELLANEOUS) ×3 IMPLANT
FACESHIELD WRAPAROUND (MASK) ×9 IMPLANT
GAUZE SPONGE 4X4 12PLY STRL (GAUZE/BANDAGES/DRESSINGS) ×3 IMPLANT
GLOVE BIO SURGEON STRL SZ8.5 (GLOVE) ×6 IMPLANT
GLOVE BIOGEL PI IND STRL 8.5 (GLOVE) ×1 IMPLANT
GLOVE BIOGEL PI INDICATOR 8.5 (GLOVE) ×2
GOWN SPEC L3 XXLG W/TWL (GOWN DISPOSABLE) ×3 IMPLANT
GUIDEPIN 3.2X17.5 THRD DISP (PIN) ×3 IMPLANT
GUIDEWIRE BALL NOSE 80CM (WIRE) ×2 IMPLANT
KIT TURNOVER KIT A (KITS) IMPLANT
MANIFOLD NEPTUNE II (INSTRUMENTS) ×3 IMPLANT
NAIL TIBIAL 11MM X 37.5CM (Nail) ×1 IMPLANT
NS IRRIG 1000ML POUR BTL (IV SOLUTION) ×3 IMPLANT
PACK TOTAL JOINT (CUSTOM PROCEDURE TRAY) ×1 IMPLANT
PAD CAST 4YDX4 CTTN HI CHSV (CAST SUPPLIES) IMPLANT
PADDING CAST ABS 6INX4YD NS (CAST SUPPLIES)
PADDING CAST ABS COTTON 6X4 NS (CAST SUPPLIES) IMPLANT
PADDING CAST COTTON 4X4 STRL (CAST SUPPLIES)
PROTECTOR NERVE ULNAR (MISCELLANEOUS) ×3 IMPLANT
SCREW ACECAP 48MM (Screw) ×2 IMPLANT
SCREW BONE CORT FT 4.5X65 (Screw) ×2 IMPLANT
SCREW BONE CORTICAL 5.0X40 (Screw) ×2 IMPLANT
SCREW BONE CORTICAL 5.0X44 (Screw) ×3 IMPLANT
SCREW CORTICAL 5.0X65 (Screw) ×3 IMPLANT
STAPLER VISISTAT 35W (STAPLE) ×3 IMPLANT
SUT MNCRL AB 3-0 PS2 18 (SUTURE) ×6 IMPLANT
SUT MON AB 2-0 CT1 36 (SUTURE) ×4 IMPLANT
SUT STRATAFIX PDO 1 14 VIOLET (SUTURE) ×3
SUT STRATFX PDO 1 14 VIOLET (SUTURE) ×1
SUT VIC AB 1 CT1 27 (SUTURE) ×6
SUT VIC AB 1 CT1 27XBRD ANTBC (SUTURE) ×2 IMPLANT
SUT VIC AB 2-0 CT1 27 (SUTURE)
SUT VIC AB 2-0 CT1 TAPERPNT 27 (SUTURE) IMPLANT
SUTURE STRATFX PDO 1 14 VIOLET (SUTURE) ×1 IMPLANT
TIBIAL NAIL 11MM X 37.5CM (Nail) ×3 IMPLANT
TOWEL OR 17X26 10 PK STRL BLUE (TOWEL DISPOSABLE) IMPLANT
WATER STERILE IRR 1000ML POUR (IV SOLUTION) ×6 IMPLANT
WRAP KNEE MAXI GEL POST OP (GAUZE/BANDAGES/DRESSINGS) ×3 IMPLANT

## 2018-08-21 NOTE — Discharge Summary (Signed)
Physician Discharge Summary  Patient ID: Steven Crane MRN: [id number] DOB/AGE: 1960/08/13 58 y.o.  Admit date: 08/21/2018 Discharge date: 08/22/2018  Admission Diagnoses:  Displaced oblique fracture of shaft of left tibia, initial encounter for closed fracture  Discharge Diagnoses:  Principal Problem:   Displaced oblique fracture of shaft of left tibia, initial encounter for closed fracture Active Problems:   Displaced comminuted fracture of shaft of left tibia, initial encounter for closed fracture   Past Medical History:  Diagnosis Date  . Anxiety   . Arthritis    "hands, back, knees" (08/27/2013)  . Asthma   . CHF (congestive heart failure) ([facility]) 02/2010  . Chronic bronchitis ([facility])    "get it q year"  . Chronic lower back pain   . Closed head injury 1975  . Coronary artery disease May 2015   s/p successful PTCA/DES x 1 to distal RCA and PTCA/DES x 1 to OM-20 Aug 2013  . Depression   . DVT (deep venous thrombosis) ([facility]) 2008   "LLE"  . GERD (gastroesophageal reflux disease)   . Headache    hs of but no longer has problems   . History of kidney stones   . Hyperlipemia   . Hypertension   . Hypertriglyceridemia   . LV dysfunction    LVEF 40% at time of cardiac cath May 2015  . Morbid obesity (H[facility])   . Myocardial infarction (H[facility]) 02/2010  . Psoriasis   . Type II diabetes mellitus (H[facility])     Surgeries: Procedure(s): INTRAMEDULLARY (IM) NAIL TIBIAL on 08/21/2018   Consultants (if any):   Discharged Condition: Improved  Hospital Course: Steven Crane is an 58 y.o. male who was admitted 08/21/2018 with a diagnosis of Displaced oblique fracture of shaft of left tibia, initial encounter for closed fracture and went to the operating room on 08/21/2018 and underwent the above named procedures.    He was given perioperative antibiotics:  Anti-infectives (From admission, onward)   Start     Dose/Rate Route Frequency Ordered Stop   08/21/18 1700  ceFAZolin (ANCEF) IVPB 2g/100 mL  premix     2 g 200 mL/hr over 30 Minutes Intravenous Every 6 hours 08/21/18 1511 08/22/18 0531   08/21/18 0700  ceFAZolin (ANCEF) 3 g in dextrose 5 % 50 mL IVPB     3 g 100 mL/hr over 30 Minutes Intravenous On call to O.R. 08/21/18 4098 08/21/18 1127    .  50% weight bearing left leg with a walker.  He was given sequential compression devices, early ambulation, and lovenox for DVT prophylaxis.  He benefited maximally from the hospital stay and there were no complications.    Recent vital signs:  Vitals:   08/22/18 1057 08/22/18 1058  BP: (!) 161/84 (!) 158/80  Pulse: 84 81  Resp: 16   Temp: 98.3 F (36.8 C)   SpO2: 97% 95%    Recent laboratory studies:  Lab Results  Component Value Date   HGB 11.6 (L) 08/22/2018   HGB 12.8 (L) 08/21/2018   HGB 11.2 (L) 08/28/2013   Lab Results  Component Value Date   WBC 6.9 08/22/2018   PLT 225 08/22/2018   Lab Results  Component Value Date   INR 0.9 08/21/2018   Lab Results  Component Value Date   NA 137 08/22/2018   K 4.2 08/22/2018   CL 103 08/22/2018   CO2 26 08/22/2018   BUN 12 08/22/2018   CREATININE 0.74 08/22/2018   GLUCOSE 110 (H) 08/22/2018  Discharge Medications:   Allergies as of 08/22/2018      Reactions   Adhesive [tape] Rash      Medication List    STOP taking these medications   ibuprofen 600 MG tablet Commonly known as:  ADVIL   oxyCODONE-acetaminophen 5-325 MG tablet Commonly known as:  PERCOCET/ROXICET     TAKE these medications   acetaminophen 500 MG tablet Commonly known as:  TYLENOL Take 1,000 mg by mouth every 8 (eight) hours as needed for moderate pain.   albuterol 108 (90 Base) MCG/ACT inhaler Commonly known as:  VENTOLIN HFA Inhale 2 puffs into the lungs every 6 (six) hours as needed for wheezing or shortness of breath.   ALPRAZolam 0.5 MG tablet Commonly known as:  XANAX TAKE ONE-HALF TO ONE TABLET BY MOUTH ONCE DAILY AS NEEDED What changed:    how much to take  how to  take this  when to take this  reasons to take this   amLODipine 5 MG tablet Commonly known as:  NORVASC Take 1 tablet (5 mg total) by mouth daily.   aspirin EC 81 MG tablet Take 1 tablet (81 mg total) by mouth daily.   atorvastatin 40 MG tablet Commonly known as:  LIPITOR Take 1 tablet (40 mg total) by mouth daily.   blood glucose meter kit and supplies Test glucose once daily. ICD 10 E11.9   docusate sodium 100 MG capsule Commonly known as:  Colace Take 1 capsule (100 mg total) by mouth 2 (two) times daily.   enalapril 20 MG tablet Commonly known as:  VASOTEC Take 1 tablet (20 mg total) by mouth 2 (two) times daily.   enoxaparin 40 MG/0.4ML injection Commonly known as:  LOVENOX Inject 1.4 mLs (140 mg total) into the skin 2 (two) times daily.   fenofibrate 160 MG tablet Take 1 tablet (160 mg total) by mouth daily.   glyBURIDE-metformin 5-500 MG tablet Commonly known as:  GLUCOVANCE Take 2 tablets by mouth 2 (two) times daily.   insulin detemir 100 UNIT/ML injection Commonly known as:  Levemir INJECT 55 units BID   metoprolol succinate 50 MG 24 hr tablet Commonly known as:  TOPROL-XL Take 1 tablet (50 mg total) by mouth daily. Take with or immediately following a meal.   nitroGLYCERIN 0.4 MG SL tablet Commonly known as:  NITROSTAT Place 1 tablet (0.4 mg total) under the tongue every 5 (five) minutes as needed for chest pain. If no relief call 911.   ondansetron 4 MG tablet Commonly known as:  Zofran Take 1 tablet (4 mg total) by mouth every 8 (eight) hours as needed for nausea or vomiting.   oxyCODONE 5 MG immediate release tablet Commonly known as:  Roxicodone Take 2 tablets (10 mg total) by mouth every 4 (four) hours as needed for severe pain.   pantoprazole 40 MG tablet Commonly known as:  PROTONIX Take one tablet every day What changed:    how much to take  how to take this  when to take this   senna 8.6 MG Tabs tablet Commonly known as:   SENOKOT Take 2 tablets (17.2 mg total) by mouth at bedtime.            Discharge Care Instructions  (From admission, onward)         Start     Ordered   08/21/18 0000  Partial weight bearing    Question Answer Comment  % Body Weight 50   Laterality left   Extremity Lower  08/21/18 2116          Diagnostic Studies: Dg Knee 1-2 Views Left  Result Date: 08/19/2018 CLINICAL DATA:  Fall after stepping in hole. EXAM: LEFT KNEE - 1-2 VIEW COMPARISON:  None. FINDINGS: Exam demonstrates moderate degenerative change over the lateral compartment and to lesser extent at over the medial compartment and patellofemoral joints. There is a displaced oblique fracture of the proximal fibular diaphysis as well as partially visualized displaced oblique fracture of the distal tibial diaphysis. 3 cm focus of calcification superior to the patella which may represent a loose body. IMPRESSION: Minimally displaced oblique fracture of the proximal fibular diaphysis and partially visualized fracture of the distal tibial diaphysis. Osteoarthritis. Electronically Signed   By: Elberta Fortis M.D.   On: 08/19/2018 13:17   Dg Tibia/fibula Left  Result Date: 08/21/2018 CLINICAL DATA:  Intraoperative fixation of left tibia. EXAM: LEFT TIBIA AND FIBULA - 2 VIEW fluoroscopic time 1 minutes and 11 seconds. COMPARISON:  August 19, 2018 FINDINGS: There is been interval insertion of a rod in the tibia fixated by 2 screws in the proximal tibia and 2 screws in the distal tibia. No gross malalignment is identified. Fracture of the fibula is unchanged. IMPRESSION: Fixation of left tibia as described.  No malalignment is seen. Electronically Signed   By: Sherian Rein M.D.   On: 08/21/2018 13:47   Dg Ankle Complete Left  Result Date: 08/19/2018 CLINICAL DATA:  Left ankle pain after the patient stepped in a hole and fell. EXAM: LEFT ANKLE COMPLETE - 3+ VIEW COMPARISON:  None. FINDINGS: There is a slightly displaced spiral  fracture of the distal tibial shaft. The fracture does not appear to extend into the ankle joint. There is slight arthritis at the tibiotalar joint. 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 The distal fibula is intact. IMPRESSION: Slightly displaced spiral fracture of the distal left tibial shaft. Electronically Signed   By: Francene Boyers M.D.   On: 08/19/2018 12:59   Dg Tibia/fibula Left Port  Result Date: 08/21/2018 CLINICAL DATA:  Postop for fixation of left tibial fracture. EXAM: PORTABLE LEFT TIBIA AND FIBULA - 2 VIEW COMPARISON:  Intraoperative film Aug 21, 2018 left tibia. FINDINGS: Patient status post fixation of fracture of distal tibia with a tibial rod fixated proximally by 2 screws and distally by 2 screws. No gross malalignment is identified. Fracture of the proximal fibula is unchanged. IMPRESSION: Status post surgical fixation of left tibial fracture with insertion of left tibial rod without gross malalignment. Electronically Signed   By: Sherian Rein M.D.   On: 08/21/2018 14:27   Dg C-arm 1-60 Min-no Report  Result Date: 08/21/2018 Fluoroscopy was utilized by the requesting physician.  No radiographic interpretation.    Disposition:   Discharge Instructions    Call MD / Call 911   Complete by:  As directed    If you experience chest pain or shortness of breath, CALL 911 and be transported to the hospital emergency room.  If you develope a fever above 101 F, pus (white drainage) or increased drainage or redness at the wound, or calf pain, call your surgeon's office.   Constipation Prevention   Complete by:  As directed    Drink plenty of fluids.  Prune juice may be helpful.  You may use a stool softener, such as Colace (over the counter) 100 mg twice a day.  Use MiraLax (over the counter) for constipation as needed.   Diet -  low sodium heart healthy   Complete by:  As directed    Partial weight bearing   Complete by:  As directed    %  Body Weight:  50   Laterality:  left   Extremity:  Lower      Follow-up Information    Numair Masden, Arlys John, MD. Schedule an appointment as soon as possible for a visit in 2 weeks.   Specialty:  Orthopedic Surgery Why:  For suture removal, For wound re-check Contact information: [address] [place] [address] [phone]        [facility] Follow up.   Why:  Physical Therapy-agency will call to arrange visit Contact information: [phone]           Signed: Iline Oven Leni Pankonin 08/23/2018, 10:38 PM

## 2018-08-21 NOTE — Anesthesia Procedure Notes (Signed)
Procedure Name: Intubation Date/Time: 08/21/2018 11:16 AM Performed by: Montel Clock, CRNA Pre-anesthesia Checklist: Patient identified, Emergency Drugs available, Suction available, Patient being monitored and Timeout performed Patient Re-evaluated:Patient Re-evaluated prior to induction Oxygen Delivery Method: Circle system utilized Preoxygenation: Pre-oxygenation with 100% oxygen Induction Type: IV induction and Rapid sequence Laryngoscope Size: Mac and 4 Grade View: Grade I Tube type: Oral Tube size: 7.5 mm Number of attempts: 1 Airway Equipment and Method: Stylet Placement Confirmation: ETT inserted through vocal cords under direct vision,  positive ETCO2 and breath sounds checked- equal and bilateral Secured at: 23 cm Tube secured with: Tape Dental Injury: Teeth and Oropharynx as per pre-operative assessment

## 2018-08-21 NOTE — H&P (Signed)
PREOPERATIVE H&P  Chief Complaint: left tibia fracture  HPI: Steven Crane is a 58 y.o. male who presents for preoperative history and physical with a diagnosis of left tibia fracture. DOI 08/20/18.  Seen in ED, where he was splinted and discharged home. I saw him yesterday in the office.  This is significantly impairing activities of daily living.  He was indicated for surgical management.  L great toe paresthesias improved from yesterday.  Past Medical History:  Diagnosis Date  . Anxiety   . Arthritis    "hands, back, knees" (08/27/2013)  . Asthma   . CHF (congestive heart failure) ([place]) 02/2010  . Chronic bronchitis ([place])    "get it q year"  . Chronic lower back pain   . Closed head injury 1975  . Coronary artery disease May 2015   s/p successful PTCA/DES x 1 to distal RCA and PTCA/DES x 1 to OM-20 Aug 2013  . Depression   . DVT (deep venous thrombosis) ([place]) 2008   "LLE"  . GERD (gastroesophageal reflux disease)   . Headache    hs of but no longer has problems   . History of kidney stones   . Hyperlipemia   . Hypertension   . Hypertriglyceridemia   . LV dysfunction    LVEF 40% at time of cardiac cath May 2015  . Morbid obesity ([place])   . Myocardial infarction ([place]) 02/2010  . Psoriasis   . Type II diabetes mellitus ([place])    Past Surgical History:  Procedure Laterality Date  . COLONOSCOPY  01/06/2012   Procedure: COLONOSCOPY;  Surgeon: [name], MD;  Location[facility];  Service: Endoscopy;  Laterality: N/A;  7:30 AM  . CORONARY ANGIOPLASTY WITH STENT PLACEMENT  02/2010; 08/27/2013   "1 + 3"   . KNEE ARTHROSCOPY Left 1981  . LEFT HEART CATHETERIZATION WITH CORONARY ANGIOGRAM N/A 08/27/2013   Procedure: LEFT HEART CATHETERIZATION WITH CORONARY ANGIOGRAM;  Surgeon: [name], MD;  Location: [facility] CATH LAB;  Service: Cardiovascular;  Laterality: N/A;  . right wrist fracture surgery      Social History   Socioeconomic History  . Marital status: Single     Spouse name: Not on file  . Number of children: Not on file  . Years of education: Not on file  . Highest education level: Not on file  Occupational History  . Not on file  Social Needs  . Financial resource strain: Not on file  . Food insecurity:    Worry: Not on file    Inability: Not on file  . Transportation needs:    Medical: Not on file    Non-medical: Not on file  Tobacco Use  . Smoking status: Never Smoker  . Smokeless tobacco: Current User    Types: Snuff, Chew  . Tobacco comment: "started dipping @ age 26; quit for 5 1/2 yrs at one time; hadn't chewed in awhile"  Substance and Sexual Activity  . Alcohol use: Yes    Alcohol/week: 0.0 standard drinks    Comment: occ   . Drug use: No  . Sexual activity: Yes  Lifestyle  . Physical activity:    Days per week: Not on file    Minutes per session: Not on file  . Stress: Not on file  Relationships  . Social connections:    Talks on phone: Not on file    Gets together: Not on file    Attends religious service: Not on file  Active member of club or organization: Not on file    Attends meetings of clubs or organizations: Not on file    Relationship status: Not on file  Other Topics Concern  . Not on file  Social History Narrative  . Not on file   Family History  Problem Relation Age of Onset  . Coronary artery disease Father   . Diabetes type II Father   . Diabetes Father   . Heart attack Father   . Hypertension Mother    Allergies  Allergen Reactions  . Adhesive [Tape] Rash   Prior to Admission medications   Medication Sig Start Date End Date Taking? Authorizing Provider  acetaminophen (TYLENOL) 500 MG tablet Take 1,000 mg by mouth every 8 (eight) hours as needed for moderate pain.    Yes [provider]  amLODipine (NORVASC) 5 MG tablet Take 1 tablet (5 mg total) by mouth daily. 07/15/18  Yes [name], MD  aspirin EC 81 MG tablet Take 1 tablet (81 mg total) by mouth daily. 04/10/18  Yes  Herminio Commons, MD  atorvastatin (LIPITOR) 40 MG tablet Take 1 tablet (40 mg total) by mouth daily. 04/10/18 08/20/18 Yes Herminio Commons, MD  blood glucose meter kit and supplies Test glucose once daily. ICD 10 E11.9 09/26/17  Yes [name], MD  enalapril (VASOTEC) 20 MG tablet Take 1 tablet (20 mg total) by mouth 2 (two) times daily. 07/15/18  Yes [name], MD  fenofibrate 160 MG tablet Take 1 tablet (160 mg total) by mouth daily. 07/15/18  Yes [name], MD  glyBURIDE-metformin (GLUCOVANCE) 5-500 MG tablet Take 2 tablets by mouth 2 (two) times daily. 07/15/18  Yes [name], MD  ibuprofen (ADVIL) 600 MG tablet Take 1 tablet (600 mg total) by mouth every 6 (six) hours as needed. Patient taking differently: Take 600 mg by mouth every 6 (six) hours as needed (pain).  08/19/18  Yes Virgel Manifold, MD  insulin detemir (LEVEMIR) 100 UNIT/ML injection INJECT 55 units BID 07/15/18  Yes [name], MD  metoprolol succinate (TOPROL-XL) 50 MG 24 hr tablet Take 1 tablet (50 mg total) by mouth daily. Take with or immediately following a meal. 07/15/18  Yes [name], MD  oxyCODONE-acetaminophen (PERCOCET/ROXICET) 5-325 MG tablet Take 2 tablets by mouth every 6 (six) hours as needed for severe pain. 08/19/18  Yes Virgel Manifold, MD  pantoprazole (PROTONIX) 40 MG tablet Take one tablet every day Patient taking differently: Take 40 mg by mouth daily. Take one tablet every day 07/15/18  Yes [name], MD  albuterol (PROVENTIL HFA;VENTOLIN HFA) 108 (90 Base) MCG/ACT inhaler Inhale 2 puffs into the lungs every 6 (six) hours as needed for wheezing or shortness of breath. 01/28/17   Kathyrn Drown, MD  ALPRAZolam (XANAX) 0.5 MG tablet TAKE ONE-HALF TO ONE TABLET BY MOUTH ONCE DAILY AS NEEDED Patient taking differently: Take 0.25-0.5 mg by mouth daily as needed for anxiety. TAKE ONE-HALF TO ONE TABLET BY MOUTH ONCE DAILY AS NEEDED 04/09/18   [name], MD   nitroGLYCERIN (NITROSTAT) 0.4 MG SL tablet Place 1 tablet (0.4 mg total) under the tongue every 5 (five) minutes as needed for chest pain. If no relief call 911. 04/09/18   [name], MD     Positive ROS: All other systems have been reviewed and were otherwise negative with the exception of those mentioned in the HPI and as above.  Physical Exam:  General: Alert, no acute distress Cardiovascular: No pedal edema Respiratory: No cyanosis, no use of accessory musculature GI: No organomegaly, abdomen is soft and non-tender Skin: No lesions in the area of chief complaint Neurologic: Sensation intact distally Psychiatric: Patient is competent for consent with normal mood and affect Lymphatic: No axillary or cervical lymphadenopathy  MUSCULOSKELETAL: LLE skin intact. Moderate swelling. Compartments soft and compressible.Subjective sensory change great toe. (+) active TA/GS/EHL without increased pain.   Assessment: left tibia fracture  Plan: Plan for Procedure(s): INTRAMEDULLARY (IM) NAIL TIBIAL  The risks benefits and alternatives were discussed with the patient including but not limited to the risks of nonoperative treatment, versus surgical intervention including infection, bleeding, nerve injury, blood clots, malunion, nonunion, hardware failure,  cardiopulmonary complications, morbidity, mortality, among others, and they were willing to proceed.   Bertram Savin, MD Cell [phone]   08/21/2018 10:13 AM

## 2018-08-21 NOTE — Progress Notes (Signed)
Inpatient Diabetes Program Recommendations  AACE/ADA: New Consensus Statement on Inpatient Glycemic Control (2015)  Target Ranges:  Prepandial:   less than 140 mg/dL      Peak postprandial:   less than 180 mg/dL (1-2 hours)      Critically ill patients:  140 - 180 mg/dL   Results for Steven Crane, Steven Crane (MRN [id number]) as of 08/21/2018 15:17  Ref. Range 08/21/2018 07:04 08/21/2018 13:40  Glucose-Capillary Latest Ref Range: 70 - 99 mg/dL 185 (H) 190 (H)   Results for Steven Crane, Steven Crane (MRN [id number]) as of 08/21/2018 15:17  Ref. Range 09/26/2017 08:12 01/02/2018 08:18 04/09/2018 08:29 07/15/2018 09:47 08/21/2018 07:20  Hemoglobin A1C Latest Ref Range: 4.8 - 5.6 % 9.5 (A) 7.6 (A) 9.2 (A) 7.9 (A) 8.9 (H)    Admit with: L tibia fracture  History: DM, CHF  Home DM Meds: Glucovance 5/500 mg--2 tablets BID       Levemir 55 units BID  Current Orders: Glucovance 5/500 mg--2 tablets BID      Levemir 55 units BID      Novolog Moderate Correction Scale/ SSI (0-15 units) TID AC      Underwent Surgery today (Intramedullary fixation of left tibia. Closed treatment of fibula shaft fracture).  Home DM Meds to start this afternoon.  Hemoglobin A1c levels have ranged from 7.6% to 9.5% over the last 9 months.  Current A1c= 8.9%.  No recommendations yet since home meds to start today.     --Will follow patient during hospitalization--  Wyn Quaker RN, MSN, CDE Diabetes Coordinator Inpatient Glycemic Control Team Team Pager: [phone] (8a-5p)

## 2018-08-21 NOTE — Transfer of Care (Signed)
Immediate Anesthesia Transfer of Care Note  Patient: Steven Crane  Procedure(s) Performed: INTRAMEDULLARY (IM) NAIL TIBIAL (Left Knee)  Patient Location: PACU  Anesthesia Type:General  Level of Consciousness: drowsy and patient cooperative  Airway & Oxygen Therapy: Patient Spontanous Breathing and Patient connected to face mask oxygen  Post-op Assessment: Report given to RN and Post -op Vital signs reviewed and stable  Post vital signs: Reviewed and stable  Last Vitals:  Vitals Value Taken Time  BP 165/85 08/21/2018  1:36 PM  Temp    Pulse 80 08/21/2018  1:40 PM  Resp 20 08/21/2018  1:40 PM  SpO2 97 % 08/21/2018  1:40 PM  Vitals shown include unvalidated device data.  Last Pain:  Vitals:   08/21/18 0725  TempSrc:   PainSc: 6          Complications: No apparent anesthesia complications

## 2018-08-21 NOTE — Discharge Instructions (Signed)
50% weightbearing left lower extremity with walker. Elevate toes above nose to reduce pain and swelling. Apply ice as directed to fracture site. Keep dressing clean and dry.  Do not remove. Call [phone] as soon as possible to schedule a follow-up appointment in 2 weeks.

## 2018-08-21 NOTE — Anesthesia Postprocedure Evaluation (Signed)
Anesthesia Post Note  Patient: Steven Crane  Procedure(s) Performed: INTRAMEDULLARY (IM) NAIL TIBIAL (Left Knee)     Patient location during evaluation: PACU Anesthesia Type: General Level of consciousness: awake and alert Pain management: pain level controlled Vital Signs Assessment: post-procedure vital signs reviewed and stable Respiratory status: spontaneous breathing, nonlabored ventilation, respiratory function stable and patient connected to nasal cannula oxygen Cardiovascular status: blood pressure returned to baseline and stable Postop Assessment: no apparent nausea or vomiting Anesthetic complications: no    Last Vitals:  Vitals:   08/21/18 1615 08/21/18 1709  BP: (!) 158/80 (!) 150/72  Pulse: 65 70  Resp: 16 16  Temp: 37.1 C 37 C  SpO2: 98% 100%    Last Pain:  Vitals:   08/21/18 1709  TempSrc: Oral  PainSc:                  Taylia Berber COKER

## 2018-08-21 NOTE — Op Note (Signed)
OPERATIVE REPORT   08/21/2018  1:18 PM  PATIENT:  Steven Crane   SURGEON:  Bertram Savin, MD  ASSISTANT:  Staff.   PREOPERATIVE DIAGNOSIS: Closed left spiral tibia fracture. Oblique proximal fibula fracture.  POSTOPERATIVE DIAGNOSIS:  Same.  PROCEDURE: Intramedullary fixation of left tibia. Closed treatment of fibula shaft fracture.  ANESTHESIA:   GETA.  ANTIBIOTICS: 3 g Ancef..  IMPLANTS: Biomet Ace tibial nail size 11 x 375 mm. 5.0 mm distal interlocking screw x2. 5.0 mm proximal interlocking screw x2.  SPECIMENS: None.  COMPLICATIONS: None.  DISPOSITION: Stable to PACU.  SURGICAL INDICATIONS:  Steven Crane is a 58 y.o. 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  He was working in his yard, when he stepped in a ditch on 08/20/2018.  He had left lower extremity pain and inability to weight-bear.  He was brought to the emergency department in [facility], where x-rays revealed left tib-fib fracture.  He was placed into a splint and discharged home.  I saw him in the office yesterday.  He was indicated for intramedullary fixation of the left tibia.  The risks, benefits, and alternatives were discussed with the patient preoperatively including but not limited to the risks of infection, bleeding, nerve / blood vessel injury, malunion, nonunion, cardiopulmonary complications, the need for repeat surgery, among others, and the patient was willing to proceed.  PROCEDURE IN DETAIL: The patient was identified in the holding area using 2 identifiers.  The surgical site was marked by myself.  He was taken to the operating room, and general anesthesia was induced on the operating room table.  All bony prominences were well-padded.  He was positioned on a bone foam positioner.  The left lower extremity was prepped and draped in the normal sterile surgical fashion.  Timeout was called, verifying site and site of  surgery.  He did receive IV antibiotics within 60 minutes of beginning the procedure.  I examined his left knee.  He had a previous oblique scar over the patella.  Using a #10 blade I ellipsed out this previous scar.  I carried the incision distally in the midline over the center of the tibial tubercle.  I carried the incision a little proximally.  Anterior incision was made to facilitate further knee replacement down the road.  I then created a limited lateral peripatellar arthrotomy.  Blunt digital dissection was used to develop the plane between the prepatellar fat pad and the patellar tendon.  The kneecap was very arthritic and difficult to mobilize.  I carried the arthrotomy a little proximally.  I was able to palpate a loose body within the suprapatellar pouch, which I removed and passed off to the back table.  Using live AP and lateral fluoroscopy views, I determine the standard starting point for a tibial nail using a guidepin.  Opening reamer was used, taking care to protect the patellar tendon.  Guide wire was then placed to the level of the fracture.  The fracture was able to be reduced anatomically with closed means using traction and rotation.  I passed the guidewire to the physeal scar of the ankle.  Reduction of the fracture and placement of the guidewire were confirmed on AP and lateral fluoroscopy views.  I sequentially reamed up to a 12 mm reamer with minimal chatter.  I measured the length of the guidewire using a lateral x-ray of the knee.  The real nail was opened and  inserted onto the jig.  The knee was impacted into place over the guidewire without any difficulty.  The guidewire was removed.  The nail was seated to the appropriate depth using fluoroscopy.  The nail was statically locked proximally with a medial to lateral 5.0 millimeter screw that was placed through the jig.  I then placed a screw from anterolateral to posterior medial to avoid the peroneal nerve.  I then turned my  attention distally.  Using perfect circle technique, I placed a total of 2 medial to lateral distal interlocking screws.  I removed the jig from the nail.  Final AP, lateral, and oblique views of the ankle, fracture site, and knee were obtained.  The fracture was in anatomic alignment.  All hardware appropriately placed.  The wounds were copiously irrigated with normal saline.  I closed the arthrotomy with a combination of #1 Vicryl and #1 strata fix.  Deep dermal layer was closed with 2-0 Monocryl, and skin was closed with staples.  Dermabond was applied to the skin.  Once the glue was fully dried, sterile bulky Jones dressing was applied.  The patient was then extubated, and taken to the PACU in stable condition.  Sponge, needle, and instrument counts were correct in the end of the case x2.  There were no known complications.  At the end of the case, the lower extremity was mild to moderately swollen.  Compartments soft and compressible.  Palpable pulses present.  POSTOPERATIVE PLAN: Postoperatively, the patient be admitted to the hospital.  50% weightbearing left lower extremity with a walker.  Begin Lovenox for DVT prophylaxis.  He will work with physical therapy.  We will plan for discharge home tomorrow.  He will return to the office in 2 weeks for routine postoperative care.

## 2018-08-22 LAB — BASIC METABOLIC PANEL
Anion gap: 8 (ref 5–15)
BUN: 12 mg/dL (ref 6–20)
CO2: 26 mmol/L (ref 22–32)
Calcium: 8.3 mg/dL — ABNORMAL LOW (ref 8.9–10.3)
Chloride: 103 mmol/L (ref 98–111)
Creatinine, Ser: 0.74 mg/dL (ref 0.61–1.24)
GFR calc Af Amer: >60 mL/min (ref >60)
GFR calc non Af Amer: >60 mL/min (ref >60)
Glucose, Bld: 110 mg/dL — ABNORMAL HIGH (ref 70–99)
Potassium: 4.2 mmol/L (ref 3.5–5.1)
Sodium: 137 mmol/L (ref 135–145)

## 2018-08-22 LAB — CBC
HCT: 36.3 % — ABNORMAL LOW (ref 39.0–52.0)
Hemoglobin: 11.6 g/dL — ABNORMAL LOW (ref 13.0–17.0)
MCH: 28 pg (ref 26.0–34.0)
MCHC: 32 g/dL (ref 30.0–36.0)
MCV: 87.7 fL (ref 80.0–100.0)
Platelets: 225 10*3/uL (ref 150–400)
RBC: 4.14 MIL/uL — ABNORMAL LOW (ref 4.22–5.81)
RDW: 13.2 % (ref 11.5–15.5)
WBC: 6.9 10*3/uL (ref 4.0–10.5)
nRBC: 0 % (ref 0.0–0.2)

## 2018-08-22 LAB — GLUCOSE, CAPILLARY
Glucose-Capillary: 115 mg/dL — ABNORMAL HIGH (ref 70–99)
Glucose-Capillary: 180 mg/dL — ABNORMAL HIGH (ref 70–99)

## 2018-08-22 NOTE — TOC Progression Note (Signed)
     Home health agencies that serve [phone]. Your favorite home health agencies  [place] of Patient Care Rating Patient Survey Summary Rating  [place]  [phone] 3 out of 5 stars 4 out of [place]  [phone] 3  out of 5 stars 4 out of [place]  [phone] 4 out of 5 stars 4 out of [place]  [phone] 3 out of 5 stars 5 out of [place]  [phone] 4  out of 5 stars 3 out of [place]  [phone]) [phone] 4 out of 5 stars 4 out of [place]  [phone] 4 out of 5 stars 4 out of [place]  [phone] 4 out of 5 stars 4 out of [place] AGE  [phone] 3 out of 5 stars 3 out of [place]  [phone] 3  out of 5 stars 4 out of [place]  [phone] 3 out of 5 stars 4 out of 5 stars  [facility]  [phone] 3  out of 5 stars 3 out of [place]  [phone] 3  out of 5 stars Not Available  [place]  [phone] 4  out of 5 stars 3 out of [place] number Footnote as displayed on [place]  1 This agency provides services under a federal waiver program to non-traditional, chronic long term population.  2 This agency provides services to a special needs population.  3 Not Available.  4 The number of patient episodes for this measure is too small to report.  5 This measure currently does not have data or provider has been certified/recertified for less than 6 months.  6 The national average for this measure is not provided because of state-to-state differences in data collection.  7  Medicare is not displaying rates for this measure for any home health agency, because of an issue with the data.  8 There were problems with the data and they are being corrected.  9 Zero, or very few, patients met the survey's rules for inclusion. The scores shown, if any, reflect a very small number of surveys and may not accurately tell how an agency is doing.  10 Survey results are based on less than 12 months of data.  11 Fewer than 70 patients completed the survey. Use the scores shown, if any, with caution as the number of surveys may be too low to accurately tell how an agency is doing.  12 No survey results are available for this period.  13 Data suppressed by CMS for one or more quarters.

## 2018-08-22 NOTE — Evaluation (Signed)
Physical Therapy Evaluation Patient Details Name: Steven Crane MRN: [id number] DOB: 10/05/1960 Today's Date: 08/22/2018   History of Present Illness  58 y.o. male admitted with L distal tibia and proximal fibula fx. s/p IM nail to tibia. PWB 50%  Clinical Impression  Pt ambulated 86' with crutches with supervision for safety, mild loss of balance x 1 which pt was able to self correct, good adherence to 50% PWB LLE. Instructed pt in LLE strengthening exercises.      Follow Up Recommendations Home health PT    Equipment Recommendations  None recommended by PT(pt has needed DME, he has crutches, 3 in 1 and WC, declined RW)    Recommendations for Other Services       Precautions / Restrictions Precautions Precautions: Fall Restrictions LLE Weight Bearing: Partial weight bearing LLE Partial Weight Bearing Percentage or Pounds: 50%      Mobility  Bed Mobility Overal bed mobility: Modified Independent             General bed mobility comments: used rail  Transfers Overall transfer level: Needs assistance Equipment used: None Transfers: Sit to/from Stand Sit to Stand: Supervision            Ambulation/Gait Ambulation/Gait assistance: Supervision Gait Distance (Feet): 70 Feet Assistive device: Crutches Gait Pattern/deviations: Step-to pattern;Decreased stride length Gait velocity: decr   General Gait Details: Instructed pt in use of crutches, mild LOB x 1 but pt able to self correct, good adherence to PWB, 3/10 pain walking  Stairs            Wheelchair Mobility    Modified Rankin (Stroke Patients Only)       Balance Overall balance assessment: Modified Independent                                           Pertinent Vitals/Pain Pain Assessment: 0-10 Pain Score: 3  Pain Location: LLE Pain Descriptors / Indicators: Cramping Pain Intervention(s): Limited activity within patient's tolerance;Monitored during session;Premedicated  before session    Home Living Family/patient expects to be discharged to:: Private residence Living Arrangements: Spouse/significant other;Children Available Help at Discharge: Available 24 hours/day;Family   Home Access: Ramped entrance     Home Layout: One level Home Equipment: Wheelchair - manual;Crutches;Bedside commode      Prior Function Level of Independence: Independent         Comments: was doing yard work     Journalist, newspaper        Extremity/Trunk Assessment   Upper Extremity Assessment Upper Extremity Assessment: Overall WFL for tasks assessed    Lower Extremity Assessment Lower Extremity Assessment: LLE deficits/detail LLE Deficits / Details: SLR 3/5, toes swollen but can wiggle toes and actively PF/DF ankle (ROM limited by ace wrap), knee ext -3/5 LLE Sensation: decreased light touch(great toe tingly)    Cervical / Trunk Assessment Cervical / Trunk Assessment: Normal  Communication   Communication: No difficulties  Cognition Arousal/Alertness: Awake/alert Behavior During Therapy: WFL for tasks assessed/performed Overall Cognitive Status: Within Functional Limits for tasks assessed                                        General Comments      Exercises General Exercises - Lower Extremity Ankle Circles/Pumps: AROM;Both;5 reps Long Arc Quad:  AROM;Left;5 reps;Seated Straight Leg Raises: AROM;Left;5 reps;Supine   Assessment/Plan    PT Assessment Patent does not need any further PT services  PT Problem List         PT Treatment Interventions      PT Goals (Current goals can be found in the Care Plan section)  Acute Rehab PT Goals PT Goal Formulation: All assessment and education complete, DC therapy    Frequency     Barriers to discharge        Co-evaluation               AM-PAC PT "6 Clicks" Mobility  Outcome Measure Help needed turning from your back to your side while in a flat bed without using bedrails?:  None Help needed moving from lying on your back to sitting on the side of a flat bed without using bedrails?: None Help needed moving to and from a bed to a chair (including a wheelchair)?: None Help needed standing up from a chair using your arms (e.g., wheelchair or bedside chair)?: None Help needed to walk in hospital room?: A Little Help needed climbing 3-5 steps with a railing? : A Little 6 Click Score: 22    End of Session Equipment Utilized During Treatment: Gait belt Activity Tolerance: Patient tolerated treatment well Patient left: in chair;with call bell/phone within reach;with chair alarm set Nurse Communication: Mobility status      Time: 2863-8177 PT Time Calculation (min) (ACUTE ONLY): 18 min   Charges:   PT Evaluation $PT Eval Low Complexity: 1 Low         Philomena Doheny PT 08/22/2018  Acute Rehabilitation Services Pager [phone] Office [phone]

## 2018-08-22 NOTE — Progress Notes (Signed)
Subjective: 1 Day Post-Op Procedure(s) (LRB): INTRAMEDULLARY (IM) NAIL TIBIAL (Left) Patient reports pain as 0 on 0-10 scale. O-Pain. Doing very well. Circulation intact. Will DC   Objective: Vital signs in last 24 hours: Temp:  [98 F (36.7 C)-99.3 F (37.4 C)] 98.1 F (36.7 C) (05/02 0452) Pulse Rate:  [65-82] 80 (05/02 0452) Resp:  [13-20] 20 (05/02 0452) BP: (125-165)/(68-85) 155/77 (05/02 0452) SpO2:  [93 %-100 %] 96 % (05/02 0452)  Intake/Output from previous day: 05/01 0701 - 05/02 0700 In: 2746.7 [P.O.:360; I.V.:2176.7; IV Piggyback:210] Out: 2650 [Urine:2500; Blood:150] Intake/Output this shift: Total I/O In: -  Out: 250 [Urine:250]  Recent Labs    08/21/18 0720 08/22/18 0337  HGB 12.8* 11.6*   Recent Labs    08/21/18 0720 08/22/18 0337  WBC 7.5 6.9  RBC 4.52 4.14*  HCT 39.3 36.3*  PLT 250 225   Recent Labs    08/21/18 0720 08/22/18 0337  NA 135 137  K 4.1 4.2  CL 105 103  CO2 23 26  BUN 15 12  CREATININE 0.68 0.74  GLUCOSE 188* 110*  CALCIUM 8.6* 8.3*   Recent Labs    08/21/18 0720  INR 0.9    Neurologically intact   Assessment/Plan: 1 Day Post-Op Procedure(s) (LRB): INTRAMEDULLARY (IM) NAIL TIBIAL (Left) Up with therapy and then DC.       Latanya Maudlin 08/22/2018, 8:04 AM

## 2018-08-22 NOTE — TOC Initial Note (Addendum)
Transition of Care [facility]) - Initial/Assessment Note    Patient Details  Name: Steven Crane MRN: [id number] Date of Birth: [birth year]  [facility] CM/SW Contact:    Erenest Rasher, RN Phone Number: 08/22/2018, 11:16 AM  Clinical Narrative:                 Spoke to pt and states his son and daughter will be in home to assist with care. Offered choice pt requested [place].   Expected Discharge Plan: [place] Barriers to Discharge: No Barriers Identified   Patient Goals and CMS Choice Patient states their goals for this hospitalization and ongoing recovery are:: be able to ambulate  CMS Medicare.gov Compare Post Acute Care list provided to:: Patient Choice offered to / list presented to : Patient  Expected Discharge Plan and Services Expected Discharge Plan: [place] In-house Referral: NA Discharge Planning Services: CM Consult Post Acute Care Choice: Durable Medical Equipment, Home Health Living arrangements for the past 2 months: Mobile Home Expected Discharge Date: 08/22/18               DME Arranged: [place]ford Rile rolling DME Agency: [employer] Date DME Agency Contacted: 08/22/18 Time DME Agency Contacted: 12 Representative spoke with at DME Agency: [place] Arranged: PT [place] Agency: [place] ([place]) Date HH Agency Contacted: 08/22/18 Time HH Agency Contacted: 1 Representative spoke with at [place]: Corene Cornea   Prior Living Arrangements/Services Living arrangements for the past 2 months: Mobile Home Lives with:: Adult Children, Significant Other Patient language and need for interpreter reviewed:: Yes Do you feel safe going back to the place where you live?: Yes      Need for Family Participation in Patient Care: Yes (Comment) Care giver support system in place?: Yes (comment) Current home services: (cane, wheelchair, crutches, bedside commode) Criminal Activity/Legal Involvement  Pertinent to Current Situation/Hospitalization: No - Comment as needed  Activities of Daily Living Home Assistive Devices/Equipment: Crutches, Wheelchair ADL Screening (condition at time of admission) Patient's cognitive ability adequate to safely complete daily activities?: Yes Is the patient deaf or have difficulty hearing?: No Does the patient have difficulty seeing, even when wearing glasses/contacts?: No Does the patient have difficulty concentrating, remembering, or making decisions?: No Patient able to express need for assistance with ADLs?: Yes Does the patient have difficulty dressing or bathing?: No Independently performs ADLs?: Yes (appropriate for developmental age) Does the patient have difficulty walking or climbing stairs?: No Weakness of Legs: Left Weakness of Arms/Hands: None  Permission Sought/Granted Permission sought to share information with : Case Manager, PCP, Family Supports Permission granted to share information with : Yes, Verbal Permission Granted  Share Information with NAME: Corliss Marcus  Permission granted to share info w AGENCY: [place], [place] granted to share info w Relationship: son  Permission granted to share info w Contact Information: [phone]  Emotional Assessment   Attitude/Demeanor/Rapport: Engaged Affect (typically observed): Accepting Orientation: : Oriented to Self, Oriented to Place, Oriented to  Time, Oriented to Situation   Psych Involvement: No (comment)  Admission diagnosis:  left tibia fracture Patient Active Problem List   Diagnosis Date Noted  . Displaced oblique fracture of shaft of left tibia, initial encounter for closed fracture 08/21/2018  . Displaced comminuted fracture of shaft of left tibia, initial encounter for closed fracture 08/21/2018  . Unstable angina ([place]) 08/27/2013  . CAD S/P percutaneous coronary angioplasty  2011 08/06/2013  . Chest pain 08/06/2013  . Kidney  stone 04/05/2013  . 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 Hyperlipemia 09/25/2012  . Diabetes mellitus without complication ([place]) 11/10/5748  . Atherosclerotic heart disease 09/25/2012  . Essential hypertension, benign 09/25/2012  . Morbid obesity ([place]) 09/25/2012  . Psoriasis 09/25/2012  . History of DVT (deep vein thrombosis) 09/25/2012   PCP:  [name], MD Pharmacy:   [facility] ([place]) [address], [place] [address] Phone: [phone] Fax: [phone]     Social Determinants of Health (SDOH) Interventions    Readmission Risk Interventions No flowsheet data found.

## 2018-08-24 ENCOUNTER — Encounter (HOSPITAL_COMMUNITY): Payer: Self-pay | Admitting: Orthopedic Surgery

## 2018-08-25 ENCOUNTER — Encounter (HOSPITAL_COMMUNITY): Payer: Self-pay | Admitting: Orthopedic Surgery

## 2018-09-17 ENCOUNTER — Telehealth: Payer: Self-pay | Admitting: Family Medicine

## 2018-09-17 NOTE — Telephone Encounter (Signed)
Form in dr steve's folder 

## 2018-09-17 NOTE — Telephone Encounter (Signed)
Pt dropped off wellness form to be completed  Please call pt when done   In FORMS basket at nurses station

## 2018-12-01 ENCOUNTER — Telehealth: Payer: Self-pay | Admitting: Family Medicine

## 2018-12-01 DIAGNOSIS — Z79899 Other long term (current) drug therapy: Secondary | ICD-10-CM

## 2018-12-01 DIAGNOSIS — E119 Type 2 diabetes mellitus without complications: Secondary | ICD-10-CM

## 2018-12-01 DIAGNOSIS — I10 Essential (primary) hypertension: Secondary | ICD-10-CM

## 2018-12-01 NOTE — Telephone Encounter (Signed)
Last labs on 07/10/2018; Urine micro, PSA, BMET, hepatic and lipid. Please advise. Thank you

## 2018-12-01 NOTE — Telephone Encounter (Signed)
Lip liv A1c 

## 2018-12-01 NOTE — Telephone Encounter (Signed)
Lab orders placed. Pt is aware

## 2018-12-01 NOTE — Telephone Encounter (Signed)
Pt has virtual 6 month follow up on 9/23. Does pt need lab work done before appt?

## 2019-01-09 LAB — HEPATIC FUNCTION PANEL
ALT: 39 IU/L (ref 0–44)
AST: 20 IU/L (ref 0–40)
Albumin: 4.1 g/dL (ref 3.8–4.9)
Alkaline Phosphatase: 56 IU/L (ref 39–117)
Bilirubin Total: 0.3 mg/dL (ref 0.0–1.2)
Bilirubin, Direct: 0.12 mg/dL (ref 0.00–0.40)
Total Protein: 6.5 g/dL (ref 6.0–8.5)

## 2019-01-09 LAB — LIPID PANEL
Chol/HDL Ratio: 4 ratio (ref 0.0–5.0)
Cholesterol, Total: 121 mg/dL (ref 100–199)
HDL: 30 mg/dL — ABNORMAL LOW (ref >39)
LDL Chol Calc (NIH): 69 mg/dL (ref 0–99)
Triglycerides: 123 mg/dL (ref 0–149)
VLDL Cholesterol Cal: 22 mg/dL (ref 5–40)

## 2019-01-09 LAB — HEMOGLOBIN A1C
Est. average glucose Bld gHb Est-mCnc: 209 mg/dL
Hgb A1c MFr Bld: 8.9 % — ABNORMAL HIGH (ref 4.8–5.6)

## 2019-01-13 ENCOUNTER — Ambulatory Visit (INDEPENDENT_AMBULATORY_CARE_PROVIDER_SITE_OTHER): Payer: BC Managed Care – PPO | Admitting: Family Medicine

## 2019-01-13 ENCOUNTER — Encounter: Payer: Self-pay | Admitting: Family Medicine

## 2019-01-13 DIAGNOSIS — N486 Induration penis plastica: Secondary | ICD-10-CM | POA: Diagnosis not present

## 2019-01-13 DIAGNOSIS — E119 Type 2 diabetes mellitus without complications: Secondary | ICD-10-CM

## 2019-01-13 DIAGNOSIS — E785 Hyperlipidemia, unspecified: Secondary | ICD-10-CM | POA: Diagnosis not present

## 2019-01-13 DIAGNOSIS — I10 Essential (primary) hypertension: Secondary | ICD-10-CM

## 2019-01-13 MED ORDER — INSULIN DETEMIR 100 UNIT/ML ~~LOC~~ SOLN: SUBCUTANEOUS | 5 refills | Status: DC

## 2019-01-13 NOTE — Progress Notes (Signed)
   Subjective:  Audio only patient calls with numerous concerns  Patient ID: Steven Crane, male    DOB: June 21, 1960, 58 y.o.   MRN: [id number]  Diabetes He presents for his follow-up diabetic visit. He has type 2 diabetes mellitus. There are no hypoglycemic associated symptoms. (Pt states he has some stomach tightness around hernia in abdomen area ) There are no hypoglycemic complications. There are no diabetic complications. He is compliant with treatment some of the time (pt takes med daily but does not check sugars often ). He does not see a podiatrist.Eye exam is current.    Virtual Visit via Video Note  I connected with Steven Crane on 01/13/19 at  8:30 AM EDT by a video enabled telemedicine application and verified that I am speaking with the correct person using two identifiers.  Location: Patient: home Provider: office   I discussed the limitations of evaluation and management by telemedicine and the availability of in person appointments. The patient expressed understanding and agreed to proceed.  History of Present Illness:    Observations/Objective:   Assessment and Plan:   Follow Up Instructions:    I discussed the assessment and treatment plan with the patient. The patient was provided an opportunity to ask questions and all were answered. The patient agreed with the plan and demonstrated an understanding of the instructions.   The patient was advised to call back or seek an in-person evaluation if the symptoms worsen or if the condition fails to improve as anticipated.  I provided 27 minutes of non-face-to-face time during this encounter.   Vicente Males, LPN Patient claims compliance with diabetes medication. No obvious side effects. Reports no substantial low sugar spells. Most numbers are generally in good range when checked fasting. Generally does not miss a dose of medication. Watching diabetic diet closely  Patient continues to take lipid medication  regularly. No obvious side effects from it. Generally does not miss a dose. Prior blood work results are reviewed with patient. Patient continues to work on fat intake in diet  Blood pressure medicine and blood pressure levels reviewed today with patient. Compliant with blood pressure medicine. States does not miss a dose. No obvious side effects. Blood pressure generally good when checked elsewhere. Watching salt intake.   Patient notes abnormal curvature to his penis during erections.  Very concerned about this.  Has noticed on TV and advertisement for this concern.  No longer wants his penis look like a curved fruit, as advertised.  Review of Systems No headache, no major weight loss or weight gain, no chest pain no back pain abdominal pain no change in bowel habits complete ROS otherwise negative     Objective:   Physical Exam  Virtual      Assessment & Plan:  Impression.  Type II diet diabetes.  A1c unacceptable discussed we will increase insulin from 65 to 75 units twice daily.  Patient to tighten up his diet.  2.  Hypertension.  Blood pressure good when checked elsewhere to maintain same dose salt intake discussed  3.  Hyperlipidemia.  Good control discussed maintain same meds  4.  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  Discussed.  Potential interventions discussed.  Patient would like to go on see urologist we will work on this  Flu shot to be obtained at work this fall diet exercise discussed medications refilled follow-up in 6 months for wellness plus chronic

## 2019-01-15 ENCOUNTER — Ambulatory Visit: Payer: BC Managed Care – PPO | Admitting: Family Medicine

## 2019-01-16 ENCOUNTER — Other Ambulatory Visit: Payer: Self-pay | Admitting: Family Medicine

## 2019-01-18 ENCOUNTER — Other Ambulatory Visit: Payer: Self-pay

## 2019-01-18 MED ORDER — GLYBURIDE-METFORMIN 5-500 MG PO TABS: 2.0000 | ORAL_TABLET | Freq: Two times a day (BID) | ORAL | 0 refills | Status: DC

## 2019-02-09 ENCOUNTER — Encounter: Payer: Self-pay | Admitting: Family Medicine

## 2019-03-15 ENCOUNTER — Other Ambulatory Visit: Payer: Self-pay

## 2019-03-15 ENCOUNTER — Ambulatory Visit (INDEPENDENT_AMBULATORY_CARE_PROVIDER_SITE_OTHER): Payer: BC Managed Care – PPO | Admitting: Family Medicine

## 2019-03-15 DIAGNOSIS — U071 COVID-19: Secondary | ICD-10-CM | POA: Diagnosis not present

## 2019-03-15 MED ORDER — AMOXICILLIN-POT CLAVULANATE 875-125 MG PO TABS: 1.0000 | ORAL_TABLET | Freq: Two times a day (BID) | ORAL | 0 refills | Status: DC

## 2019-03-15 MED ORDER — ALBUTEROL SULFATE HFA 108 (90 BASE) MCG/ACT IN AERS: 2.0000 | INHALATION_SPRAY | Freq: Four times a day (QID) | RESPIRATORY_TRACT | 0 refills | Status: DC | PRN

## 2019-03-15 NOTE — Progress Notes (Signed)
   Subjective:  Audio only  Patient ID: Steven Crane, male    DOB: [birth year], 58 y.o.   MRN: [id number]  HPI bilateral ear pain and pressure about 2 weeks ago. Then started getting sinus drainage. Now having cough and chest congestion. No fever now but one day had temp of 99. No sob. Tried tylenol. Pt states 4 or 5 coworkers have had covid and his granddaughter has covid and she came to his house last week.   Virtual Visit via Telephone Note  I connected with Steven Crane on 03/15/19 at  3:00 PM EST by telephone and verified that I am speaking with the correct person using two identifiers.  Location: Patient: home Provider: office   I discussed the limitations, risks, security and privacy concerns of performing an evaluation and management service by telephone and the availability of in person appointments. I also discussed with the patient that there may be a patient responsible charge related to this service. The patient expressed understanding and agreed to proceed.   History of Present Illness:    Observations/Objective:   Assessment and Plan:   Follow Up Instructions:    I discussed the assessment and treatment plan with the patient. The patient was provided an opportunity to ask questions and all were answered. The patient agreed with the plan and demonstrated an understanding of the instructions.   The patient was advised to call back or seek an in-person evaluation if the symptoms worsen or if the condition fails to improve as anticipated.  I provided 20 minutes of non-face-to-face time during this encounter.       Review of Systems No headache no chest pain no shortness of breath    Objective:   Physical Exam  Virtual      Assessment & Plan:  Impression probable COVID-19.  Rationale discussed.  Known exposure.  Classic symptoms.  Albuterol as needed for the wheezing.  Symptom care discussed.  Appropriate testing and pending test returned positive.  Others  within the household had symptoms, they were not our patients we advised a get tested also

## 2019-03-16 ENCOUNTER — Other Ambulatory Visit: Payer: Self-pay

## 2019-03-16 DIAGNOSIS — Z20822 Contact with and (suspected) exposure to covid-19: Secondary | ICD-10-CM

## 2019-03-17 LAB — NOVEL CORONAVIRUS, NAA: SARS-CoV-2, NAA: DETECTED — AB

## 2019-03-19 ENCOUNTER — Telehealth: Payer: Self-pay | Admitting: Family Medicine

## 2019-03-19 ENCOUNTER — Telehealth: Payer: Self-pay

## 2019-03-19 NOTE — Telephone Encounter (Signed)
Left message to return call 

## 2019-03-19 NOTE — Telephone Encounter (Signed)
Pt had COVID testing done on 03/16/2019 and results are in. Please advise. Thank you

## 2019-03-19 NOTE — Telephone Encounter (Signed)
Pt notified of positive COVID-19 test results. Pt verbalized understanding. Pt reports that he doesn't have any symptoms.Pt advised to remain in self quarantine until at least 10 days since symptom onset And 3 consecutive days fever free without antipyretics And improvement in respiratory symptoms. Patient advised to utilize over the counter medications to treat symptoms. Pt advised to seek treatment in the ED if respiratory issues/distress develops.Pt advised they should only leave home to seek and medical care and must wear a mask in public. Pt instructed to limit contact with family members or caregivers in the home. Pt advised to practice social distancing and to continue to use good preventative care measures such has frequent hand washing, staying out of crowds and cleaning hard surfaces frequently touched in the home.Pt informed that the health department will likely follow up and may have additional recommendations. Will notify [facility] Department.

## 2019-03-19 NOTE — Telephone Encounter (Signed)
Call pt, positive. How is he doing ?(tons of risk factors) how is his girlfriend doing (not our patient but fragile post stroke) disc warning signs, let him know h dept should contact, quarantine for ten d following the start of symtoms

## 2019-03-22 NOTE — Telephone Encounter (Signed)
Discussed with pt. Pt states he is doing fine. He states he just has cold symptoms. A little tightness in his chest here and there but nothing that has worried him. No chest pain, no fever, no sob. He has not heard from health dept. I told him they will be contacting him. Nurse from testing site states she would notify them. I also sent a fax to health dept with test results.  Warning signs discussed and pt verbalzied understanding. He states his girlfriends test was negative and she is doing well.

## 2019-03-22 NOTE — Telephone Encounter (Signed)
thx

## 2019-03-29 ENCOUNTER — Other Ambulatory Visit: Payer: Self-pay | Admitting: Family Medicine

## 2019-04-01 ENCOUNTER — Other Ambulatory Visit: Payer: Self-pay

## 2019-04-01 MED ORDER — ENALAPRIL MALEATE 20 MG PO TABS: 20.0000 mg | ORAL_TABLET | Freq: Two times a day (BID) | ORAL | 1 refills | Status: DC

## 2019-04-01 NOTE — Telephone Encounter (Signed)
Six mo worth 

## 2019-04-01 NOTE — Telephone Encounter (Signed)
Pt is checking on status of refill. He only has 2 pills left.

## 2019-04-01 NOTE — Telephone Encounter (Signed)
Refills sent to pharmacy. Pt contacted and verbalized understanding

## 2019-04-05 ENCOUNTER — Telehealth: Payer: Self-pay | Admitting: Family Medicine

## 2019-04-05 NOTE — Telephone Encounter (Signed)
Pt's work is requesting documentation that patient was positive for COVID.

## 2019-04-05 NOTE — Telephone Encounter (Signed)
Pt last seen 03/15/2019 and tested on 03/16/2019 and was positive. Please advise. Thank you

## 2019-04-06 NOTE — Telephone Encounter (Signed)
Copy of lab results at front for pickup. Left message to return call to notify pt.

## 2019-04-06 NOTE — Telephone Encounter (Signed)
Ok wis on a prescription pad, or alternatively can do a copy of the pos test

## 2019-04-08 NOTE — Telephone Encounter (Signed)
Pt returned call and verbalized understanding  

## 2019-04-09 ENCOUNTER — Telehealth: Payer: Self-pay | Admitting: Family Medicine

## 2019-04-09 DIAGNOSIS — R1310 Dysphagia, unspecified: Secondary | ICD-10-CM

## 2019-04-09 NOTE — Telephone Encounter (Signed)
Definitely needs referral for dyspagia, tell pt they will surely do upper endocscopy and pt likely has a web of scar tissue from chronic reflux that needs dilating, on occasion it can be something more seripus, so do it plz

## 2019-04-09 NOTE — Telephone Encounter (Signed)
Pt states that each time he eats, food feels like it stops in chest . Sometimes he can take a few bites and it will happen, then sometimes he can eat a whole meal. Pt began to hiccup and then did vomit. Pt has to use inhaler after wards because he cant breathe. Pt states this has been going on a while but is getting worse. Pt states he has a hernia at his lower abdominal pain. Pt is losing weight. Pt states this happens with any kind of food. Sometimes water will wash it down, sometimes he will have to vomit the food up. Pt has taken Tums, Protonix to try to help; mild relief.   Pt states that he is having some bumps pop up under skin. Pt states that started with the blood thinner shots. Pt states he is no longer taking those shots but he has those bumps at this time.   Please advise. Thank you.

## 2019-04-09 NOTE — Telephone Encounter (Signed)
Referral placed and pt is aware. 

## 2019-04-09 NOTE — Telephone Encounter (Signed)
Pt having trouble eating, states feels that food get stuck in throat, wonders what he should do?  Hiccups until he vomits sometimes - been going on for a while, getting worse last few months  Hernia still there   Please advise

## 2019-04-12 ENCOUNTER — Encounter: Payer: Self-pay | Admitting: Family Medicine

## 2019-04-13 ENCOUNTER — Other Ambulatory Visit: Payer: Self-pay | Admitting: Family Medicine

## 2019-04-13 ENCOUNTER — Encounter: Payer: Self-pay | Admitting: Internal Medicine

## 2019-04-28 ENCOUNTER — Other Ambulatory Visit: Payer: Self-pay | Admitting: Family Medicine

## 2019-04-29 ENCOUNTER — Other Ambulatory Visit: Payer: Self-pay | Admitting: Family Medicine

## 2019-05-05 ENCOUNTER — Encounter: Payer: Self-pay | Admitting: Gastroenterology

## 2019-05-05 ENCOUNTER — Telehealth: Payer: Self-pay | Admitting: *Deleted

## 2019-05-05 ENCOUNTER — Other Ambulatory Visit: Payer: Self-pay

## 2019-05-05 ENCOUNTER — Telehealth: Payer: Self-pay

## 2019-05-05 ENCOUNTER — Encounter: Payer: Self-pay | Admitting: *Deleted

## 2019-05-05 ENCOUNTER — Ambulatory Visit: Payer: BC Managed Care – PPO | Admitting: Gastroenterology

## 2019-05-05 ENCOUNTER — Other Ambulatory Visit: Payer: Self-pay | Admitting: *Deleted

## 2019-05-05 DIAGNOSIS — K219 Gastro-esophageal reflux disease without esophagitis: Secondary | ICD-10-CM | POA: Diagnosis not present

## 2019-05-05 DIAGNOSIS — R1310 Dysphagia, unspecified: Secondary | ICD-10-CM | POA: Insufficient documentation

## 2019-05-05 DIAGNOSIS — R1319 Other dysphagia: Secondary | ICD-10-CM

## 2019-05-05 MED ORDER — PANTOPRAZOLE SODIUM 40 MG PO TBEC: 40.0000 mg | DELAYED_RELEASE_TABLET | Freq: Two times a day (BID) | ORAL | 1 refills | Status: DC

## 2019-05-05 NOTE — Patient Instructions (Signed)
1. Increase pantoprazole to 40 mg twice daily before a meal. 2. Take your time eating, consume only soft foods until your endoscopy. 3. Upper endoscopy as scheduled.  Please see separate instructions.   Soft-Food Eating Plan A soft-food eating plan includes foods that are safe and easy to chew and swallow. Your health care provider or dietitian can help you find foods and flavors that fit into this plan. Follow this plan until your health care provider or dietitian says it is safe to start eating other foods and food textures. What are tips for following this plan? General guidelines   Take small bites of food, or cut food into pieces about  inch or smaller. Bite-sized pieces of food are easier to chew and swallow.  Eat moist foods. Avoid overly dry foods.  Avoid foods that: ? Are difficult to swallow, such as dry, chunky, crispy, or sticky foods. ? Are difficult to chew, such as hard, tough, or stringy foods. ? Contain nuts, seeds, or fruits.  Follow instructions from your dietitian about the types of liquids that are safe for you to swallow. You may be allowed to have: ? Thick liquids only. This includes only liquids that are thicker than honey. ? Thin and thick liquids. This includes all beverages and foods that become liquid at room temperature.  To make thick liquids: ? Purchase a commercial liquid thickening powder. These are available at grocery stores and pharmacies. ? Mix the thickener into liquids according to instructions on the label. ? Purchase ready-made thickened liquids. ? Thicken soup by pureeing, straining to remove chunks, and adding flour, potato flakes, or corn starch. ? Add commercial thickener to foods that become liquid at room temperature, such as milk shakes, yogurt, ice cream, gelatin, and sherbet.  Ask your health care provider whether you need to take a fiber supplement. Cooking  Cook meats so they stay tender and moist. Use methods like braising,  stewing, or baking in liquid.  Cook vegetables and fruit until they are soft enough to be mashed with a fork.  Peel soft, fresh fruits such as peaches, nectarines, and melons.  When making soup, make sure chunks of meat and vegetables are smaller than  inch.  Reheat leftover foods slowly so that a tough crust does not form. What foods are allowed? The items listed below may not be a complete list. Talk with your dietitian about what dietary choices are best for you. Grains Breads, muffins, pancakes, or waffles moistened with syrup, jelly, or butter. Dry cereals well-moistened with milk. Moist, cooked cereals. Well-cooked pasta and rice. Vegetables All soft-cooked vegetables. Shredded lettuce. Fruits All canned and cooked fruits. Soft, peeled fresh fruits. Strawberries. Dairy Milk. Cream. Yogurt. Cottage cheese. Soft cheese without the rind. Meats and other protein foods Tender, moist ground meat, poultry, or fish. Meat cooked in gravy or sauces. Eggs. Sweets and desserts Ice cream. Milk shakes. Sherbet. Pudding. Fats and oils Butter. Margarine. Olive, canola, sunflower, and grapeseed oil. Smooth salad dressing. Smooth cream cheese. Mayonnaise. Gravy. What foods are not allowed? The items listed bemay not be a complete list. Talk with your dietitian about what dietary choices are best for you. Grains Coarse or dry cereals, such as bran, granola, and shredded wheat. Tough or chewy crusty breads, such as [place] bread or baguettes. Breads with nuts, seeds, or fruit. Vegetables All raw vegetables. Cooked corn. Cooked vegetables that are tough or stringy. Tough, crisp, fried potatoes and potato skins. Fruits Fresh fruits with skins or seeds, or  both, such as apples, pears, and grapes. Stringy, high-pulp fruits, such as papaya, pineapple, coconut, and mango. Fruit leather and all dried fruit. Dairy Yogurt with nuts or coconut. Meats and other protein foods Hard, dry sausages. Dry meat,  poultry, or fish. Meats with gristle. Fish with bones. Fried meat or fish. Lunch meat and hotdogs. Nuts and seeds. Chunky peanut butter or other nut butters. Sweets and desserts Cakes or cookies that are very dry or chewy. Desserts with dried fruit, nuts, or coconut. Fried pastries. Very rich pastries. Fats and oils Cream cheese with fruit or nuts. Salad dressings with seeds or chunks. Summary  A soft-food eating plan includes foods that are safe and easy to swallow. Generally, the foods should be soft enough to be mashed with a fork.  Avoid foods that are dry, hard to chew, crunchy, sticky, stringy, or crispy.  Ask your health care provider whether you need to thicken your liquids and if you need to take a fiber supplement. This information is not intended to replace advice given to you by your health care provider. Make sure you discuss any questions you have with your health care provider. Document Revised: 07/30/2018 Document Reviewed: 06/11/2016 Elsevier Patient Education  [place].

## 2019-05-05 NOTE — Assessment & Plan Note (Signed)
Pleasant 59 year old gentleman with solid food dysphagia progressive over the past several months.  Refractory reflux.  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  Recommend EGD with esophageal dilation with deep sedation given multiple comorbidities and anxiolytics.  I have discussed the risks, alternatives, benefits with regards to but not limited to the risk of reaction to medication, bleeding, infection, perforation and the patient is agreeable to proceed. Written consent to be obtained.  We will increase pantoprazole to 40 mg twice daily before meals.  Recommend soft mechanical diet until endoscopy.  Chew food thoroughly.  Use plenty liquids with meals.

## 2019-05-05 NOTE — Telephone Encounter (Signed)
FYI VM received, pt wanted to mention to LSL that he gets hiccups after eating. Pt states he forgot to mention this at his apt today 05/05/2019.Marland Kitchen

## 2019-05-05 NOTE — Progress Notes (Signed)
Primary Care Physician:  [name], MD  Primary Gastroenterologist:  Garfield Cornea, MD   Chief Complaint  Patient presents with  . Dysphagia    sometimes has to vomit  . Gastroesophageal Reflux  . belching    HPI:  Steven Crane is a 59 y.o. male here for solid food dysphagia at the request of Dr. Wolfgang Phoenix.  Food gets stuck. Sometimes able to wash down. Other times has to vomit to get relief. Progressed last several months. No issue with pills/liquids. Having progressive heartburn as well. Takes pantoprazole every day. TUMS on top of it due to refractory issues.  Believes omeprazole controls his symptoms better in the past.  States that it was stopped at some time and switched to pantoprazole.  He believes it was because he was also on Plavix at the time.  Bowel movements are okay.  No melena or rectal bleeding.  He has lost about 5 pounds with progressive dysphagia.  He has intermittent abdominal discomfort related umbilical hernia.  States he saw surgeon years ago but surgery not offered.  COVID positive 03/16/2019.    Current Outpatient Medications  Medication Sig Dispense Refill  . acetaminophen (TYLENOL) 500 MG tablet Take 1,000 mg by mouth every 8 (eight) hours as needed for moderate pain.     Marland Kitchen albuterol (PROVENTIL HFA;VENTOLIN HFA) 108 (90 Base) MCG/ACT inhaler Inhale 2 puffs into the lungs every 6 (six) hours as needed for wheezing or shortness of breath. 1 Inhaler 2  . albuterol (VENTOLIN HFA) 108 (90 Base) MCG/ACT inhaler INHALE 2 PUFFS BY MOUTH EVERY 6 HOURS AS NEEDED FOR WHEEZING FOR SHORTNESS OF BREATH 18 g 0  . ALPRAZolam (XANAX) 0.5 MG tablet TAKE ONE-HALF TO ONE TABLET BY MOUTH ONCE DAILY AS NEEDED (Patient taking differently: Take 0.25-0.5 mg by mouth daily as needed for anxiety. TAKE ONE-HALF TO ONE TABLET BY MOUTH ONCE DAILY AS NEEDED) 30 tablet 5  . amLODipine (NORVASC) 5 MG tablet Take 1 tablet by mouth once daily 90 tablet 0  . aspirin EC 81 MG tablet Take 1  tablet (81 mg total) by mouth daily. 90 tablet 3  . atorvastatin (LIPITOR) 40 MG tablet Take 1 tablet (40 mg total) by mouth daily. 90 tablet 3  . blood glucose meter kit and supplies Test glucose once daily. ICD 10 E11.9 1 each 5  . enalapril (VASOTEC) 20 MG tablet Take 1 tablet (20 mg total) by mouth 2 (two) times daily. 180 tablet 1  . fenofibrate 160 MG tablet Take 1 tablet by mouth once daily 90 tablet 0  . glyBURIDE-metformin (GLUCOVANCE) 5-500 MG tablet Take 2 tablets by mouth 2 (two) times daily. 360 tablet 0  . insulin detemir (LEVEMIR) 100 UNIT/ML injection INJECT 70 units BID 3 mL 5  . metoprolol succinate (TOPROL-XL) 50 MG 24 hr tablet Take 1 tablet (50 mg total) by mouth daily. Take with or immediately following a meal. 90 tablet 1  . pantoprazole (PROTONIX) 40 MG tablet Take one tablet every day (Patient taking differently: Take 40 mg by mouth daily. Take one tablet every day) 90 tablet 1   No current facility-administered medications for this visit.    Allergies as of 05/05/2019 - Review Complete 05/05/2019  Allergen Reaction Noted  . Adhesive [tape] Rash 12/24/2011    Past Medical History:  Diagnosis Date  . Anxiety   . Arthritis    "hands, back, knees" (08/27/2013)  . Asthma   . CHF (congestive heart failure) ([place]) 02/2010  .  Chronic bronchitis ([place])    "get it q year"  . Chronic lower back pain   . Closed head injury 1975  . Coronary artery disease May 2015   s/p successful PTCA/DES x 1 to distal RCA and PTCA/DES x 1 to OM-20 Aug 2013  . Depression   . DVT (deep venous thrombosis) ([place]) 2008   "LLE"  . GERD (gastroesophageal reflux disease)   . Headache    hs of but no longer has problems   . History of kidney stones   . Hyperlipemia   . Hypertension   . Hypertriglyceridemia   . LV dysfunction    LVEF 40% at time of cardiac cath May 2015  . Morbid obesity ([place])   . Myocardial infarction ([place]) 02/2010  . Psoriasis   . Type II diabetes mellitus ([place])      Past Surgical History:  Procedure Laterality Date  . COLONOSCOPY  01/06/2012   Dr. [name]: suboptimal prep. normal exam. next colonoscopy in 10 years.   . CORONARY ANGIOPLASTY WITH STENT PLACEMENT  02/2010; 08/27/2013   "1 + 3"   . KNEE ARTHROSCOPY Left 1981  . LEFT HEART CATHETERIZATION WITH CORONARY ANGIOGRAM N/A 08/27/2013   Procedure: LEFT HEART CATHETERIZATION WITH CORONARY ANGIOGRAM;  Surgeon: [name], MD;  Location: [facility] CATH LAB;  Service: Cardiovascular;  Laterality: N/A;  . right wrist fracture surgery     . TIBIA IM NAIL INSERTION Left 08/21/2018   Procedure: INTRAMEDULLARY (IM) NAIL TIBIAL;  Surgeon: Rod Can, MD;  Location: [facility] ORS;  Service: Orthopedics;  Laterality: Left;    Family History  Problem Relation Age of Onset  . Coronary artery disease Father   . Diabetes type II Father   . Diabetes Father   . Heart attack Father   . Hypertension Mother     Social History   Socioeconomic History  . Marital status: Single    Spouse name: Not on file  . Number of children: Not on file  . Years of education: Not on file  . Highest education level: Not on file  Occupational History  . Not on file  Tobacco Use  . Smoking status: Never Smoker  . Smokeless tobacco: Current User    Types: Snuff, Chew  . Tobacco comment: "started dipping @ age 12; quit for 5 1/2 yrs at one time; hadn't chewed in awhile"  Substance and Sexual Activity  . Alcohol use: Yes    Alcohol/week: 0.0 standard drinks    Comment: occ   . Drug use: No  . Sexual activity: Yes  Other Topics Concern  . Not on file  Social History Narrative  . Not on file   Social Determinants of Health   Financial Resource Strain:   . Difficulty of Paying Living Expenses: Not on file  Food Insecurity:   . Worried About Charity fundraiser in the Last Year: Not on file  . Ran Out of Food in the Last Year: Not on file  Transportation Needs:   . Lack of Transportation (Medical): Not on file  .  Lack of Transportation (Non-Medical): Not on file  Physical Activity:   . Days of Exercise per Week: Not on file  . Minutes of Exercise per Session: Not on file  Stress:   . Feeling of Stress : Not on file  Social Connections:   . Frequency of Communication with Friends and Family: Not on file  . Frequency of Social Gatherings with Friends and Family: Not on file  .  Attends Religious Services: Not on file  . Active Member of Clubs or Organizations: Not on file  . Attends Archivist Meetings: Not on file  . Marital Status: Not on file  Intimate Partner Violence:   . Fear of Current or Ex-Partner: Not on file  . Emotionally Abused: Not on file  . Physically Abused: Not on file  . Sexually Abused: Not on file      ROS:  General: Negative for anorexia, weight loss, fever, chills, fatigue, weakness. Eyes: Negative for vision changes.  ENT: Negative for hoarseness, nasal congestion. See hpi CV: Negative for chest pain, angina, palpitations, dyspnea on exertion, peripheral edema.  Respiratory: Negative for dyspnea at rest, dyspnea on exertion, cough, sputum, wheezing.  GI: See history of present illness. GU:  Negative for dysuria, hematuria, urinary incontinence, urinary frequency, nocturnal urination.  MS: Negative for joint pain, low back pain.  Derm: Negative for rash or itching.  Neuro: Negative for weakness, abnormal sensation, seizure, frequent headaches, memory loss, confusion.  Psych: Negative for anxiety, depression, suicidal ideation, hallucinations.  Endo: Negative for unusual weight change.  Heme: Negative for bruising or bleeding. Allergy: Negative for rash or hives.    Physical Examination:  BP (!) 148/65   Pulse 77   Temp (!) 97.3 F (36.3 C) (Temporal)   Ht _0  (1.88 m)   Wt (!) 301 lb 3.2 oz (136.6 kg)   BMI 38.67 kg/m    General: Well-nourished, well-developed in no acute distress.  Head: Normocephalic, atraumatic.   Eyes: Conjunctiva pink,  no icterus. Mouth: masked Neck: Supple without thyromegaly, masses, or lymphadenopathy.  Lungs: Clear to auscultation bilaterally.  Heart: Regular rate and rhythm, no murmurs rubs or gallops.  Abdomen: Bowel sounds are normal, nontender, nondistended, no hepatosplenomegaly or masses, no abdominal bruits or    hernia , no rebound or guarding.   Rectal: not performed Extremities: No lower extremity edema. No clubbing or deformities.  Neuro: Alert and oriented x 4 , grossly normal neurologically.  Skin: Warm and dry, no rash or jaundice.   Psych: Alert and cooperative, normal mood and affect.  Labs: Lab Results  Component Value Date   HGBA1C 8.9 (H) 01/08/2019   Lab Results  Component Value Date   CREATININE 0.74 08/22/2018   BUN 12 08/22/2018   NA 137 08/22/2018   K 4.2 08/22/2018   CL 103 08/22/2018   CO2 26 08/22/2018   Lab Results  Component Value Date   ALT 39 01/08/2019   AST 20 01/08/2019   ALKPHOS 56 01/08/2019   BILITOT 0.3 01/08/2019   Lab Results  Component Value Date   WBC 6.9 08/22/2018   HGB 11.6 (L) 08/22/2018   HCT 36.3 (L) 08/22/2018   MCV 87.7 08/22/2018   PLT 225 08/22/2018     Imaging Studies: No results found.

## 2019-05-05 NOTE — Telephone Encounter (Signed)
Called pt and he is scheduled for procedure 06/17/2019 at 11:15am. Patient aware if covid test is needed I will mail this him with his pre-op appt. Confirmed mailing address. Orders entered.

## 2019-05-06 NOTE — Progress Notes (Signed)
Cc'ed to pcp °

## 2019-05-06 NOTE — Telephone Encounter (Signed)
Noted  

## 2019-05-06 NOTE — Telephone Encounter (Signed)
Noted. Likely associated with cause of his dysphagia. To evaluate at upcoming [place].

## 2019-05-12 LAB — HM DIABETES EYE EXAM

## 2019-05-14 ENCOUNTER — Encounter: Payer: Self-pay | Admitting: Family Medicine

## 2019-05-26 ENCOUNTER — Encounter: Payer: Self-pay | Admitting: Family Medicine

## 2019-06-01 ENCOUNTER — Telehealth (INDEPENDENT_AMBULATORY_CARE_PROVIDER_SITE_OTHER): Payer: BC Managed Care – PPO | Admitting: Cardiovascular Disease

## 2019-06-01 ENCOUNTER — Encounter: Payer: Self-pay | Admitting: Cardiovascular Disease

## 2019-06-01 VITALS — Ht 74.0 in | Wt 300.0 lb

## 2019-06-01 DIAGNOSIS — R1310 Dysphagia, unspecified: Secondary | ICD-10-CM | POA: Diagnosis not present

## 2019-06-01 DIAGNOSIS — I25118 Atherosclerotic heart disease of native coronary artery with other forms of angina pectoris: Secondary | ICD-10-CM

## 2019-06-01 DIAGNOSIS — R1319 Other dysphagia: Secondary | ICD-10-CM

## 2019-06-01 DIAGNOSIS — K219 Gastro-esophageal reflux disease without esophagitis: Secondary | ICD-10-CM

## 2019-06-01 DIAGNOSIS — E785 Hyperlipidemia, unspecified: Secondary | ICD-10-CM

## 2019-06-01 DIAGNOSIS — I10 Essential (primary) hypertension: Secondary | ICD-10-CM

## 2019-06-01 DIAGNOSIS — Z955 Presence of coronary angioplasty implant and graft: Secondary | ICD-10-CM

## 2019-06-01 NOTE — Patient Instructions (Signed)

## 2019-06-01 NOTE — Progress Notes (Signed)
Virtual Visit via Telephone Note   This visit type was conducted due to national recommendations for restrictions regarding the COVID-19 Pandemic (e.g. social distancing) in an effort to limit this patient's exposure and mitigate transmission in our community.  Due to his co-morbid illnesses, this patient is at least at moderate risk for complications without adequate follow up.  This format is felt to be most appropriate for this patient at this time.  The patient did not have access to video technology/had technical difficulties with video requiring transitioning to audio format only (telephone).  All issues noted in this document were discussed and addressed.  No physical exam could be performed with this format.  Please refer to the patient's chart for his  consent to telehealth for [facility].   Date:  06/01/2019   ID:  Steven Crane, DOB 04-Aug-1960, MRN [id number]  Patient Location: Home Provider Location: Office  PCP:  [name], MD  Cardiologist:  Kate Sable, MD  Electrophysiologist:  None   Evaluation Performed:  Follow-Up Visit  Chief Complaint:  CAD  History of Present Illness:    Steven Crane is a 59 y.o. male with a history of coronary artery disease.  He underwent drug-eluting stent placement to the proximal first obtuse marginal branch as well as the distal RCA in 08/2013.  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   He's been having abdominal pain due to a hernia. He follows with GI.  He has dysphagia for solids and has had nausea and vomiting due to this. He has constipation. He is being scheduled for an EGD.   He has chest pain associated with this. It is non-exertional and only occurs after eating.  He was also diagnosed with COVID.    Past Medical History:  Diagnosis Date  . Anxiety   . Arthritis    "hands, back, knees" (08/27/2013)  . Asthma   . CHF (congestive heart failure) ([place]) 02/2010  .  Chronic bronchitis ([place])    "get it q year"  . Chronic lower back pain   . Closed head injury 1975  . Coronary artery disease May 2015   s/p successful PTCA/DES x 1 to distal RCA and PTCA/DES x 1 to OM-20 Aug 2013  . Depression   . DVT (deep venous thrombosis) ([place]) 2008   "LLE"  . GERD (gastroesophageal reflux disease)   . Headache    hs of but no longer has problems   . History of kidney stones   . Hyperlipemia   . Hypertension   . Hypertriglyceridemia   . LV dysfunction    LVEF 40% at time of cardiac cath May 2015  . Morbid obesity ([place])   . Myocardial infarction ([place]) 02/2010  . Psoriasis   . Type II diabetes mellitus ([place])    Past Surgical History:  Procedure Laterality Date  . COLONOSCOPY  01/06/2012   Dr. [name]: suboptimal prep. normal exam. next colonoscopy in 10 years.   . CORONARY ANGIOPLASTY WITH STENT PLACEMENT  02/2010; 08/27/2013   "1 + 3"   . KNEE ARTHROSCOPY Left 1981  . LEFT HEART CATHETERIZATION WITH CORONARY ANGIOGRAM N/A 08/27/2013   Procedure: LEFT HEART CATHETERIZATION WITH CORONARY ANGIOGRAM;  Surgeon: [name], MD;  Location: [facility] CATH LAB;  Service: Cardiovascular;  Laterality: N/A;  . right wrist fracture surgery     . TIBIA IM NAIL INSERTION Left 08/21/2018   Procedure: INTRAMEDULLARY (IM)  NAIL TIBIAL;  Surgeon: Rod Can, MD;  Location: [facility] ORS;  Service: Orthopedics;  Laterality: Left;     Current Meds  Medication Sig  . acetaminophen (TYLENOL) 500 MG tablet Take 1,000 mg by mouth every 8 (eight) hours as needed for moderate pain.   Marland Kitchen albuterol (PROVENTIL HFA;VENTOLIN HFA) 108 (90 Base) MCG/ACT inhaler Inhale 2 puffs into the lungs every 6 (six) hours as needed for wheezing or shortness of breath.  Marland Kitchen albuterol (VENTOLIN HFA) 108 (90 Base) MCG/ACT inhaler INHALE 2 PUFFS BY MOUTH EVERY 6 HOURS AS NEEDED FOR WHEEZING FOR SHORTNESS OF BREATH  . ALPRAZolam (XANAX) 0.5 MG tablet TAKE ONE-HALF TO ONE TABLET BY MOUTH ONCE DAILY AS NEEDED  (Patient taking differently: Take 0.25-0.5 mg by mouth daily as needed for anxiety. TAKE ONE-HALF TO ONE TABLET BY MOUTH ONCE DAILY AS NEEDED)  . amLODipine (NORVASC) 5 MG tablet Take 1 tablet by mouth once daily  . aspirin EC 81 MG tablet Take 1 tablet (81 mg total) by mouth daily.  Marland Kitchen atorvastatin (LIPITOR) 40 MG tablet Take 1 tablet (40 mg total) by mouth daily.  . blood glucose meter kit and supplies Test glucose once daily. ICD 10 E11.9  . enalapril (VASOTEC) 20 MG tablet Take 1 tablet (20 mg total) by mouth 2 (two) times daily.  . fenofibrate 160 MG tablet Take 1 tablet by mouth once daily  . glyBURIDE-metformin (GLUCOVANCE) 5-500 MG tablet Take 2 tablets by mouth 2 (two) times daily.  . insulin detemir (LEVEMIR) 100 UNIT/ML injection INJECT 70 units BID  . metoprolol succinate (TOPROL-XL) 50 MG 24 hr tablet Take 1 tablet (50 mg total) by mouth daily. Take with or immediately following a meal.  . pantoprazole (PROTONIX) 40 MG tablet Take 1 tablet (40 mg total) by mouth 2 (two) times daily before a meal.     Allergies:   Adhesive [tape]   Social History   Tobacco Use  . Smoking status: Never Smoker  . Smokeless tobacco: Current User    Types: Snuff, Chew  . Tobacco comment: "started dipping @ age 13; quit for 5 1/2 yrs at one time; hadn't chewed in awhile"  Substance Use Topics  . Alcohol use: Yes    Alcohol/week: 0.0 standard drinks    Comment: occ   . Drug use: No     Family Hx: The patient's family history includes Coronary artery disease in his father; Diabetes in his father; Diabetes type II in his father; Heart attack in his father; Hypertension in his mother.  ROS:   Please see the history of present illness.     All other systems reviewed and are negative.   Prior CV studies:   The following studies were reviewed today:  NA  Labs/Other Tests and Data Reviewed:    EKG:  No ECG reviewed.  Recent Labs: 08/22/2018: BUN 12; Creatinine, Ser 0.74; Hemoglobin 11.6;  Platelets 225; Potassium 4.2; Sodium 137 01/08/2019: ALT 39   Recent Lipid Panel Lab Results  Component Value Date/Time   CHOL 121 01/08/2019 08:24 AM   TRIG 123 01/08/2019 08:24 AM   HDL 30 (L) 01/08/2019 08:24 AM   CHOLHDL 4.0 01/08/2019 08:24 AM   CHOLHDL 4.6 01/13/2014 08:28 AM   LDLCALC 69 01/08/2019 08:24 AM    Wt Readings from Last 3 Encounters:  06/01/19 300 lb (136.1 kg)  05/05/19 (!) 301 lb 3.2 oz (136.6 kg)  08/21/18 (!) 306 lb (138.8 kg)     Objective:    Vital  Signs:  Ht _0  (1.88 m)   Wt 300 lb (136.1 kg)   BMI 38.52 kg/m    VITAL SIGNS:  reviewed  ASSESSMENT & PLAN:    1.  Coronary artery disease: History of drug-eluting stent placement to the proximal OM1 and distal RCA in May 2015.  Continue aspirin, statin, and beta-blocker.  2.  Hypertension: No change to therapy.  3. Hyperlipidemia: Continue statin.  Lipids reviewed.  LDL 69.  4.  Morbid obesity: Weight loss encouraged.  5. GERD: Takes Protonix. Being scheduled for EGD due to dysphagia for solids and associated chest pain.    COVID-19 Education: The signs and symptoms of COVID-19 were discussed with the patient and how to seek care for testing (follow up with PCP or arrange E-visit).  The importance of social distancing was discussed today.  Time:   Today, I have spent 15 minutes with the patient with telehealth technology discussing the above problems.     Medication Adjustments/Labs and Tests Ordered: Current medicines are reviewed at length with the patient today.  Concerns regarding medicines are outlined above.   Tests Ordered: No orders of the defined types were placed in this encounter.   Medication Changes: No orders of the defined types were placed in this encounter.   Follow Up:  In Person in 1 year(s)  Signed, Kate Sable, MD  06/01/2019 8:50 AM    [place]

## 2019-06-14 NOTE — Patient Instructions (Signed)
Steven Crane  06/14/2019     @PREFPERIOPPHARMACY @   Your procedure is scheduled on  06/17/2019 .  Report to [facility] at  [place].M.  Call this number if you have problems the morning of surgery:  [phone]   Remember:  Follow the diet instructions given to you by Dr Roseanne Kaufman office.                        Take these medicines the morning of surgery with A SIP OF WATER  Xanax(if needed), amlodipine, vasotec, metoprolol, protonix. Take 1/2 of your usual night time insulin the night before your procedure. DO NOT take any medication for diabetes the morning of your procedure.    Do not wear jewelry, make-up or nail polish.  Do not wear lotions, powders, or perfumes. Please wear deodorant and brush your teeth.  Do not shave 48 hours prior to surgery.  Men may shave face and neck.  Do not bring valuables to the hospital.  [facility] is not responsible for any belongings or valuables.  Contacts, dentures or bridgework may not be worn into surgery.  Leave your suitcase in the car.  After surgery it may be brought to your room.  For patients admitted to the hospital, discharge time will be determined by your treatment team.  Patients discharged the day of surgery will not be allowed to drive home.   Name and phone number of your driver:   family Special instructions:  DO NOT smoke the morning of your procedure.  Please read over the following fact sheets that you were given. Anesthesia Post-op Instructions and Care and Recovery After Surgery       Upper Endoscopy, Adult, Care After This sheet gives you information about how to care for yourself after your procedure. Your health care provider may also give you more specific instructions. If you have problems or questions, contact your health care provider. What can I expect after the procedure? After the procedure, it is common to have:  A sore throat.  Mild stomach pain or  discomfort.  Bloating.  Nausea. Follow these instructions at home:   Follow instructions from your health care provider about what to eat or drink after your procedure.  Return to your normal activities as told by your health care provider. Ask your health care provider what activities are safe for you.  Take over-the-counter and prescription medicines only as told by your health care provider.  Do not drive for 24 hours if you were given a sedative during your procedure.  Keep all follow-up visits as told by your health care provider. This is important. Contact a health care provider if you have:  A sore throat that lasts longer than one day.  Trouble swallowing. Get help right away if:  You vomit blood or your vomit looks like coffee grounds.  You have: ? A fever. ? Bloody, black, or tarry stools. ? A severe sore throat or you cannot swallow. ? Difficulty breathing. ? Severe pain in your chest or abdomen. Summary  After the procedure, it is common to have a sore throat, mild stomach discomfort, bloating, and nausea.  Do not drive for 24 hours if you were given a sedative during the procedure.  Follow instructions from your health care provider about what to eat or drink after your procedure.  Return to your normal activities as told by your health care provider.  This information is not intended to replace advice given to you by your health care provider. Make sure you discuss any questions you have with your health care provider. Document Revised: 09/30/2017 Document Reviewed: 09/08/2017 Elsevier Patient Education  [place].  Esophageal Dilatation Esophageal dilatation, also called esophageal dilation, is a procedure to widen or open (dilate) a blocked or narrowed part of the esophagus. The esophagus is the part of the body that moves food and liquid from the mouth to the stomach. You may need this procedure if:  You have a buildup of scar tissue in your  esophagus that makes it difficult, painful, or impossible to swallow. This can be caused by gastroesophageal reflux disease (GERD).  You have cancer of the esophagus.  There is a problem with how food moves through your esophagus. In some cases, you may need this procedure repeated at a later time to dilate the esophagus gradually. Tell a health care provider about:  Any allergies you have.  All medicines you are taking, including vitamins, herbs, eye drops, creams, and over-the-counter medicines.  Any problems you or family members have had with anesthetic medicines.  Any blood disorders you have.  Any surgeries you have had.  Any medical conditions you have.  Any antibiotic medicines you are required to take before dental procedures.  Whether you are pregnant or may be pregnant. What are the risks? Generally, this is a safe procedure. However, problems may occur, including:  Bleeding due to a tear in the lining of the esophagus.  A hole (perforation) in the esophagus. What happens before the procedure?  Follow instructions from your health care provider about eating or drinking restrictions.  Ask your health care provider about changing or stopping your regular medicines. This is especially important if you are taking diabetes medicines or blood thinners.  Plan to have someone take you home from the hospital or clinic.  Plan to have a responsible adult care for you for at least 24 hours after you leave the hospital or clinic. This is important. What happens during the procedure?  You may be given a medicine to help you relax (sedative).  A numbing medicine may be sprayed into the back of your throat, or you may gargle the medicine.  Your health care provider may perform the dilatation using various surgical instruments, such as: ? Simple dilators. This instrument is carefully placed in the esophagus to stretch it. ? Guided wire bougies. This involves using an endoscope  to insert a wire into the esophagus. A dilator is passed over this wire to enlarge the esophagus. Then the wire is removed. ? Balloon dilators. An endoscope with a small balloon at the end is inserted into the esophagus. The balloon is inflated to stretch the esophagus and open it up. The procedure may vary among health care providers and hospitals. What happens after the procedure?  Your blood pressure, heart rate, breathing rate, and blood oxygen level will be monitored until the medicines you were given have worn off.  Your throat may feel slightly sore and numb. This will improve slowly over time.  You will not be allowed to eat or drink until your throat is no longer numb.  When you are able to drink, urinate, and sit on the edge of the bed without nausea or dizziness, you may be able to return home. Follow these instructions at home:  Take over-the-counter and prescription medicines only as told by your health care provider.  Do not drive  for 24 hours if you were given a sedative during your procedure.  You should have a responsible adult with you for 24 hours after the procedure.  Follow instructions from your health care provider about any eating or drinking restrictions.  Do not use any products that contain nicotine or tobacco, such as cigarettes and e-cigarettes. If you need help quitting, ask your health care provider.  Keep all follow-up visits as told by your health care provider. This is important. Get help right away if you:  Have a fever.  Have chest pain.  Have pain that is not relieved by medication.  Have trouble breathing.  Have trouble swallowing.  Vomit blood. Summary  Esophageal dilatation, also called esophageal dilation, is a procedure to widen or open (dilate) a blocked or narrowed part of the esophagus.  Plan to have someone take you home from the hospital or clinic.  For this procedure, a numbing medicine may be sprayed into the back of your  throat, or you may gargle the medicine.  Do not drive for 24 hours if you were given a sedative during your procedure. This information is not intended to replace advice given to you by your health care provider. Make sure you discuss any questions you have with your health care provider. Document Revised: 02/03/2019 Document Reviewed: 02/11/2017 Elsevier Patient Education  2020 [place] After These instructions provide you with information about caring for yourself after your procedure. Your health care provider may also give you more specific instructions. Your treatment has been planned according to current medical practices, but problems sometimes occur. Call your health care provider if you have any problems or questions after your procedure. What can I expect after the procedure? After your procedure, you may:  Feel sleepy for several hours.  Feel clumsy and have poor balance for several hours.  Feel forgetful about what happened after the procedure.  Have poor judgment for several hours.  Feel nauseous or vomit.  Have a sore throat if you had a breathing tube during the procedure. Follow these instructions at home: For at least 24 hours after the procedure:      Have a responsible adult stay with you. It is important to have someone help care for you until you are awake and alert.  Rest as needed.  Do not: ? Participate in activities in which you could fall or become injured. ? Drive. ? Use heavy machinery. ? Drink alcohol. ? Take sleeping pills or medicines that cause drowsiness. ? Make important decisions or sign legal documents. ? Take care of children on your own. Eating and drinking  Follow the diet that is recommended by your health care provider.  If you vomit, drink water, juice, or soup when you can drink without vomiting.  Make sure you have little or no nausea before eating solid foods. General instructions  Take  over-the-counter and prescription medicines only as told by your health care provider.  If you have sleep apnea, surgery and certain medicines can increase your risk for breathing problems. Follow instructions from your health care provider about wearing your sleep device: ? Anytime you are sleeping, including during daytime naps. ? While taking prescription pain medicines, sleeping medicines, or medicines that make you drowsy.  If you smoke, do not smoke without supervision.  Keep all follow-up visits as told by your health care provider. This is important. Contact a health care provider if:  You keep feeling nauseous or you keep vomiting.  You feel light-headed.  You develop a rash.  You have a fever. Get help right away if:  You have trouble breathing. Summary  For several hours after your procedure, you may feel sleepy and have poor judgment.  Have a responsible adult stay with you for at least 24 hours or until you are awake and alert. This information is not intended to replace advice given to you by your health care provider. Make sure you discuss any questions you have with your health care provider. Document Revised: 07/07/2017 Document Reviewed: 07/30/2015 Elsevier Patient Education  [place].

## 2019-06-15 ENCOUNTER — Encounter (HOSPITAL_COMMUNITY): Payer: Self-pay

## 2019-06-15 ENCOUNTER — Encounter (HOSPITAL_COMMUNITY)
Admission: RE | Admit: 2019-06-15 | Discharge: 2019-06-15 | Disposition: A | Discharge status: Home / Self Care | Payer: BC Managed Care – PPO | Source: Ambulatory Visit | Attending: Internal Medicine | Admitting: Internal Medicine

## 2019-06-15 ENCOUNTER — Other Ambulatory Visit (HOSPITAL_COMMUNITY)
Admission: RE | Admit: 2019-06-15 | Discharge: 2019-06-15 | Disposition: A | Discharge status: Home / Self Care | Payer: BC Managed Care – PPO | Source: Ambulatory Visit | Attending: Internal Medicine | Admitting: Internal Medicine

## 2019-06-15 ENCOUNTER — Other Ambulatory Visit: Payer: Self-pay

## 2019-06-15 DIAGNOSIS — Z01812 Encounter for preprocedural laboratory examination: Secondary | ICD-10-CM | POA: Insufficient documentation

## 2019-06-15 DIAGNOSIS — Z20822 Contact with and (suspected) exposure to covid-19: Secondary | ICD-10-CM | POA: Insufficient documentation

## 2019-06-15 LAB — BASIC METABOLIC PANEL
Anion gap: 6 (ref 5–15)
BUN: 9 mg/dL (ref 6–20)
CO2: 26 mmol/L (ref 22–32)
Calcium: 8.6 mg/dL — ABNORMAL LOW (ref 8.9–10.3)
Chloride: 101 mmol/L (ref 98–111)
Creatinine, Ser: 0.7 mg/dL (ref 0.61–1.24)
GFR calc Af Amer: >60 mL/min (ref >60)
GFR calc non Af Amer: >60 mL/min (ref >60)
Glucose, Bld: 178 mg/dL — ABNORMAL HIGH (ref 70–99)
Potassium: 4.3 mmol/L (ref 3.5–5.1)
Sodium: 133 mmol/L — ABNORMAL LOW (ref 135–145)

## 2019-06-16 LAB — SARS CORONAVIRUS 2 (TAT 6-24 HRS): SARS Coronavirus 2: NEGATIVE

## 2019-06-17 ENCOUNTER — Ambulatory Visit (HOSPITAL_COMMUNITY)
Admission: RE | Admit: 2019-06-17 | Discharge: 2019-06-17 | Disposition: A | Discharge status: Home / Self Care | Payer: BC Managed Care – PPO | Attending: Internal Medicine | Admitting: Internal Medicine

## 2019-06-17 ENCOUNTER — Telehealth: Payer: Self-pay | Admitting: *Deleted

## 2019-06-17 ENCOUNTER — Telehealth: Payer: Self-pay

## 2019-06-17 ENCOUNTER — Ambulatory Visit (HOSPITAL_COMMUNITY): Payer: BC Managed Care – PPO | Admitting: Anesthesiology

## 2019-06-17 ENCOUNTER — Encounter (HOSPITAL_COMMUNITY)
Admission: RE | Disposition: A | Discharge status: Home / Self Care | Payer: Self-pay | Source: Home / Self Care | Attending: Internal Medicine

## 2019-06-17 DIAGNOSIS — I2510 Atherosclerotic heart disease of native coronary artery without angina pectoris: Secondary | ICD-10-CM | POA: Diagnosis not present

## 2019-06-17 DIAGNOSIS — I252 Old myocardial infarction: Secondary | ICD-10-CM | POA: Diagnosis not present

## 2019-06-17 DIAGNOSIS — F419 Anxiety disorder, unspecified: Secondary | ICD-10-CM | POA: Diagnosis not present

## 2019-06-17 DIAGNOSIS — J45909 Unspecified asthma, uncomplicated: Secondary | ICD-10-CM | POA: Diagnosis not present

## 2019-06-17 DIAGNOSIS — Z6838 Body mass index (BMI) 38.0-38.9, adult: Secondary | ICD-10-CM | POA: Insufficient documentation

## 2019-06-17 DIAGNOSIS — K219 Gastro-esophageal reflux disease without esophagitis: Secondary | ICD-10-CM | POA: Diagnosis not present

## 2019-06-17 DIAGNOSIS — R1310 Dysphagia, unspecified: Secondary | ICD-10-CM

## 2019-06-17 DIAGNOSIS — Z7982 Long term (current) use of aspirin: Secondary | ICD-10-CM | POA: Insufficient documentation

## 2019-06-17 DIAGNOSIS — F17290 Nicotine dependence, other tobacco product, uncomplicated: Secondary | ICD-10-CM | POA: Diagnosis not present

## 2019-06-17 DIAGNOSIS — I509 Heart failure, unspecified: Secondary | ICD-10-CM | POA: Diagnosis not present

## 2019-06-17 DIAGNOSIS — R12 Heartburn: Secondary | ICD-10-CM | POA: Diagnosis not present

## 2019-06-17 DIAGNOSIS — E781 Pure hyperglyceridemia: Secondary | ICD-10-CM | POA: Insufficient documentation

## 2019-06-17 DIAGNOSIS — Z955 Presence of coronary angioplasty implant and graft: Secondary | ICD-10-CM | POA: Insufficient documentation

## 2019-06-17 DIAGNOSIS — K2289 Other specified disease of esophagus: Secondary | ICD-10-CM

## 2019-06-17 DIAGNOSIS — Z86718 Personal history of other venous thrombosis and embolism: Secondary | ICD-10-CM | POA: Diagnosis not present

## 2019-06-17 DIAGNOSIS — E785 Hyperlipidemia, unspecified: Secondary | ICD-10-CM | POA: Insufficient documentation

## 2019-06-17 DIAGNOSIS — I110 Hypertensive heart disease with heart failure: Secondary | ICD-10-CM | POA: Diagnosis not present

## 2019-06-17 DIAGNOSIS — Z79899 Other long term (current) drug therapy: Secondary | ICD-10-CM | POA: Insufficient documentation

## 2019-06-17 DIAGNOSIS — E119 Type 2 diabetes mellitus without complications: Secondary | ICD-10-CM | POA: Insufficient documentation

## 2019-06-17 DIAGNOSIS — C155 Malignant neoplasm of lower third of esophagus: Secondary | ICD-10-CM | POA: Diagnosis not present

## 2019-06-17 DIAGNOSIS — F329 Major depressive disorder, single episode, unspecified: Secondary | ICD-10-CM | POA: Insufficient documentation

## 2019-06-17 DIAGNOSIS — R1319 Other dysphagia: Secondary | ICD-10-CM

## 2019-06-17 DIAGNOSIS — Z794 Long term (current) use of insulin: Secondary | ICD-10-CM | POA: Diagnosis not present

## 2019-06-17 DIAGNOSIS — K228 Other specified diseases of esophagus: Secondary | ICD-10-CM

## 2019-06-17 HISTORY — PX: BIOPSY: SHX5522

## 2019-06-17 HISTORY — PX: ESOPHAGOGASTRODUODENOSCOPY (EGD) WITH PROPOFOL: SHX5813

## 2019-06-17 LAB — GLUCOSE, CAPILLARY: Glucose-Capillary: 113 mg/dL — ABNORMAL HIGH (ref 70–99)

## 2019-06-17 SURGERY — ESOPHAGOGASTRODUODENOSCOPY (EGD) WITH PROPOFOL
Anesthesia: General

## 2019-06-17 MED ORDER — CHLORHEXIDINE GLUCONATE CLOTH 2 % EX PADS: 6.0000 | MEDICATED_PAD | Freq: Once | CUTANEOUS | Status: DC

## 2019-06-17 MED ORDER — PROMETHAZINE HCL 25 MG/ML IJ SOLN: 6.2500 mg | INTRAMUSCULAR | Status: DC | PRN

## 2019-06-17 MED ORDER — PROPOFOL 500 MG/50ML IV EMUL
INTRAVENOUS | Status: DC | PRN
  Administered 2019-06-17: 150 ug/kg/min via INTRAVENOUS

## 2019-06-17 MED ORDER — KETAMINE HCL 50 MG/5ML IJ SOSY
PREFILLED_SYRINGE | INTRAMUSCULAR | Status: AC
  Filled 2019-06-17: qty 5

## 2019-06-17 MED ORDER — GLYCOPYRROLATE PF 0.2 MG/ML IJ SOSY
PREFILLED_SYRINGE | INTRAMUSCULAR | Status: AC
  Filled 2019-06-17: qty 1

## 2019-06-17 MED ORDER — MIDAZOLAM HCL 2 MG/2ML IJ SOLN: 0.5000 mg | Freq: Once | INTRAMUSCULAR | Status: DC | PRN

## 2019-06-17 MED ORDER — HYDROCODONE-ACETAMINOPHEN 7.5-325 MG PO TABS: 1.0000 | ORAL_TABLET | Freq: Once | ORAL | Status: DC | PRN

## 2019-06-17 MED ORDER — PROPOFOL 10 MG/ML IV BOLUS
INTRAVENOUS | Status: AC
  Filled 2019-06-17: qty 20

## 2019-06-17 MED ORDER — LIDOCAINE HCL (CARDIAC) PF 50 MG/5ML IV SOSY
PREFILLED_SYRINGE | INTRAVENOUS | Status: DC | PRN
  Administered 2019-06-17: 60 mg via INTRAVENOUS

## 2019-06-17 MED ORDER — KETAMINE HCL 10 MG/ML IJ SOLN
INTRAMUSCULAR | Status: DC | PRN
  Administered 2019-06-17: 20 mg via INTRAVENOUS

## 2019-06-17 MED ORDER — LACTATED RINGERS IV SOLN: INTRAVENOUS | Status: DC

## 2019-06-17 MED ORDER — PROPOFOL 10 MG/ML IV BOLUS
INTRAVENOUS | Status: DC | PRN
  Administered 2019-06-17: 30 mg via INTRAVENOUS

## 2019-06-17 MED ORDER — GLYCOPYRROLATE 0.2 MG/ML IJ SOLN
INTRAMUSCULAR | Status: DC | PRN
  Administered 2019-06-17: .2 mg via INTRAVENOUS

## 2019-06-17 MED ORDER — HYDROMORPHONE HCL 1 MG/ML IJ SOLN: 0.2500 mg | INTRAMUSCULAR | Status: DC | PRN

## 2019-06-17 NOTE — Op Note (Addendum)
[facility] Patient Name: Steven Crane Procedure Date: 06/17/2019 10:27 AM MRN: [id number] Date of Birth: 31-May-1960 Attending MD: Gennette Pac , MD CSN: [id number] Age: 59 Admit Type: Outpatient Procedure:                Upper GI endoscopy Indications:              Dysphagia, Heartburn Providers:                Gennette Pac, MD, Jannett Celestine, RN, Burke Keels, Technician Referring MD:              Medicines:                Propofol per Anesthesia Complications:            No immediate complications. Estimated Blood Loss:     Estimated blood loss was minimal. Procedure:                Pre-Anesthesia Assessment:                           - Prior to the procedure, a History and Physical                            was performed, and patient medications and                            allergies were reviewed. The patient's tolerance of                            previous anesthesia was also reviewed. The risks                            and benefits of the procedure and the sedation                            options and risks were discussed with the patient.                            All questions were answered, and informed consent                            was obtained. Prior Anticoagulants: The patient has                            taken no previous anticoagulant or antiplatelet                            agents. ASA Grade Assessment: II - A patient with                            mild systemic disease. After reviewing the risks  and benefits, the patient was deemed in                            satisfactory condition to undergo the procedure.                           After obtaining informed consent, the endoscope was                            passed under direct vision. Throughout the                            procedure, the patient's blood pressure, pulse, and                            oxygen  saturations were monitored continuously. The                            [id number]-H190 ([id number]) scope was introduced through the                            mouth, and advanced to the second part of duodenum.                            The upper GI endoscopy was accomplished without                            difficulty. The patient tolerated the procedure                            well. 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 Seen on?face and retroflexed edges. Significant encroachment on       the esophageal lumen but lumen remains widely patent. I did not see       Barrett's epithelium. [place] otherwise appeared normal. Patent pylorus.       Normal first and second portion of the duodenum. Multiple biopsies of       the distal esophageal and cardia lesion taken for histology with a jumbo       biopsy forceps. 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 The following parameters were monitored: oxygen       saturation, heart rate, blood pressure, respiratory rate, EKG, adequacy  of pulmonary ventilation, and response to care. Recommendation:           - Patient has a contact number available for                            emergencies. The signs and symptoms of potential                            delayed complications were discussed with the                            patient. Return to normal activities tomorrow.                            Written discharge instructions were  provided to the                            patient.                           - Continue present medications. However, stop                            Protonix; trial of Dexilant 60 mg daily x3 weeks to                            see if this better manages his perceived reflux                            symptoms.                           - Full liquid /soft diet. Follow-up on pathology.                            Contrast CT of the chest, abdomen and pelvis.                            Further recommendations to follow Procedure Code(s):        --- Professional ---                           [phone], Esophagogastroduodenoscopy, flexible,                            transoral; diagnostic, including collection of                            specimen(s) by brushing or washing, when performed                            (separate procedure) Diagnosis Code(s):        --- Professional ---                           R13.10, Dysphagia, unspecified  R12, Heartburn CPT copyright 2019 American Medical Association. All rights reserved. The codes documented in this report are preliminary and upon coder review may  be revised to meet current compliance requirements. [name]. Cora Brierley, MD Gennette Pac, MD 06/17/2019 11:13:21 AM This report has been signed electronically. Number of Addenda: 1 Addendum Number: 1   Addendum Date: 06/18/2019 1:14:49 PM      "?" in text is erroneous and should not be there. [name]. [name], MD Gennette Pac, MD 06/18/2019 1:15:43 PM This report has been signed electronically.

## 2019-06-17 NOTE — Addendum Note (Signed)
Addended by: Hassan Rowan on: 06/17/2019 03:03 PM   Modules accepted: Orders

## 2019-06-17 NOTE — Telephone Encounter (Signed)
CT scans originally scheduled for 07/02/19 but after speaking to pt. Order changed to ASAP.  CT scans scheduled for 06/21/19 at 12:00pm, arrive at 11:45am. Pick up contrast. NPO 4 hours prior to test.  Pt aware of appt.

## 2019-06-17 NOTE — Transfer of Care (Signed)
Immediate Anesthesia Transfer of Care Note  Patient: Steven Crane  Procedure(s) Performed: ESOPHAGOGASTRODUODENOSCOPY (EGD) WITH PROPOFOL (N/A ) BIOPSY  Patient Location: PACU  Anesthesia Type:General  Level of Consciousness: awake, alert  and patient cooperative  Airway & Oxygen Therapy: Patient Spontanous Breathing  Post-op Assessment: Report given to RN and Post -op Vital signs reviewed and stable  Post vital signs: Reviewed and stable  Last Vitals:  Vitals Value Taken Time  BP    Temp 97.7   Pulse 76 06/17/19 1107  Resp    SpO2 96 % 06/17/19 1107  Vitals shown include unvalidated device data.  Last Pain:  Vitals:   06/17/19 0948  TempSrc: Oral  PainSc: 3       Patients Stated Pain Goal: 8 ([id number])  Complications: No apparent anesthesia complications

## 2019-06-17 NOTE — Telephone Encounter (Signed)
Dexilant 60mg , 3 weeks of samples placed at the front desk for pt to pick up with instructions to take one a day per RMR.

## 2019-06-17 NOTE — Addendum Note (Signed)
Addended by: Hassan Rowan on: 06/17/2019 02:55 PM   Modules accepted: Orders

## 2019-06-17 NOTE — Anesthesia Preprocedure Evaluation (Signed)
Anesthesia Evaluation  Patient identified by MRN, date of birth, ID band Patient awake    Reviewed: Allergy & Precautions, NPO status , Patient's Chart, lab work & pertinent test results, reviewed documented beta blocker date and time   Airway Mallampati: I  TM Distance: >3 FB Neck ROM: Full    Dental no notable dental hx. (+) Missing, Poor Dentition   Pulmonary asthma ,    Pulmonary exam normal breath sounds clear to auscultation       Cardiovascular Exercise Tolerance: Good hypertension, Pt. on medications and Pt. on home beta blockers + angina with exertion + CAD, + Past MI, + Cardiac Stents and +CHF  Normal cardiovascular examI Rhythm:Regular Rate:Normal  MI 2011- PTCA at that time  2DES ~2015 EF 40% at the time  Denies recent Cardiac CP Has GERD   Neuro/Psych  Headaches, PSYCHIATRIC DISORDERS Anxiety Depression    GI/Hepatic Neg liver ROS, GERD  Poorly Controlled and Medicated,  Endo/Other  negative endocrine ROSdiabetes  Renal/GU negative Renal ROS  negative genitourinary   Musculoskeletal  (+) Arthritis , Osteoarthritis,    Abdominal   Peds negative pediatric ROS (+)  Hematology negative hematology ROS (+)   Anesthesia Other Findings   Reproductive/Obstetrics negative OB ROS                             Anesthesia Physical Anesthesia Plan  ASA: III  Anesthesia Plan: General   Post-op Pain Management:    Induction: Intravenous  PONV Risk Score and Plan: 2 and Propofol infusion, TIVA and Treatment may vary due to age or medical condition  Airway Management Planned: Nasal Cannula and Simple Face Mask  Additional Equipment:   Intra-op Plan:   Post-operative Plan:   Informed Consent: I have reviewed the patients History and Physical, chart, labs and discussed the procedure including the risks, benefits and alternatives for the proposed anesthesia with the patient or  authorized representative who has indicated his/her understanding and acceptance.     Dental advisory given  Plan Discussed with: CRNA  Anesthesia Plan Comments: (Plan Full PPE use  Plan GA with GETA as needed d/w pt -WTP with same after Q&A)        Anesthesia Quick Evaluation

## 2019-06-17 NOTE — Anesthesia Procedure Notes (Signed)
Date/Time: 06/17/2019 10:42 AM Performed by: Vista Deck, CRNA Pre-anesthesia Checklist: Patient identified, Emergency Drugs available, Suction available, Timeout performed and Patient being monitored Patient Re-evaluated:Patient Re-evaluated prior to induction Oxygen Delivery Method: Non-rebreather mask

## 2019-06-17 NOTE — Discharge Instructions (Signed)
EGD Discharge instructions Please read the instructions outlined below and refer to this sheet in the next few weeks. These discharge instructions provide you with general information on caring for yourself after you leave the hospital. Your doctor may also give you specific instructions. While your treatment has been planned according to the most current medical practices available, unavoidable complications occasionally occur. If you have any problems or questions after discharge, please call your doctor. ACTIVITY  You may resume your regular activity but move at a slower pace for the next 24 hours.   Take frequent rest periods for the next 24 hours.   Walking will help expel (get rid of) the air and reduce the bloated feeling in your abdomen.   No driving for 24 hours (because of the anesthesia (medicine) used during the test).   You may shower.   Do not sign any important legal documents or operate any machinery for 24 hours (because of the anesthesia used during the test).  NUTRITION  Drink plenty of fluids.   You may resume your normal diet.   Begin with a light meal and progress to your normal diet.   Avoid alcoholic beverages for 24 hours or as instructed by your caregiver.  MEDICATIONS  You may resume your normal medications unless your caregiver tells you otherwise.  WHAT YOU CAN EXPECT TODAY  You may experience abdominal discomfort such as a feeling of fullness or "gas" pains.  FOLLOW-UP  Your doctor will discuss the results of your test with you.  SEEK IMMEDIATE MEDICAL ATTENTION IF ANY OF THE FOLLOWING OCCUR:  Excessive nausea (feeling sick to your stomach) and/or vomiting.   Severe abdominal pain and distention (swelling).   Trouble swallowing.   Temperature over 101 F (37.8 C).   Rectal bleeding or vomiting of blood.   Information on a full liquid diet.  Stay on a full liquid diet.  Stop Protonix; try Dexilant 60 mg once daily in the morning before  breakfast.  Go by my office and get a 3-week supply of free samples  Contrast CT of the chest, abdomen and pelvis.  Esophageal mass on EGD    OFFICE NOTIFIED. YOU WILL BE CONTACTED WITH INSTRUCTIONS REGARDING CT SCAN  Further recommendations to follow pending review of pathology report  At patient request, I spoke to [employer] at [phone] unior regarding findings.  Left message regarding finding of esophageal mass, biopsies performed CT ordered.     Full Liquid Diet A full liquid diet refers to fluids and foods that are liquid or will become liquid at room temperature. This diet should only be used for a short period of time to help you recover from illness or surgery. Your health care provider or dietitian will help you determine when it is safe to eat regular foods. What are tips for following this plan?     Reading food labels  Check food labels of nutrition shakes for the amount of protein. Look for nutrition shakes that have at least 8-10 grams of protein in each serving.  Look for drinks, such as milks and juices, that are "fortified" or "enriched." This means that vitamins and minerals have been added. Shopping  Buy premade nutritional shakes to keep on hand.  To vary your choices, buy different flavors of milks and shakes. Meal planning  Choose flavors and foods that you enjoy.  To make sure you get enough energy from food (calories): ? Eat 3 full liquid meals each day. Have a liquid  snack between each meal. ? Drink 6-8 ounces (177-237 ml) of a nutrition supplement shake with meals or as snacks. ? Add protein powder, powdered milk, milk, or yogurt to shakes to increase the amount of protein.  Drink at least one serving a day of citrus fruit juice or fruit juice that has vitamin C added. General guidelines  Before starting the full-liquid diet, check with your health care provider to know what foods you should avoid. These may include full-fat or high-fiber  liquids.  You may have any liquid or food that becomes a liquid at room temperature. The food is considered a liquid if it can be poured off a spoon at room temperature.  Do not drink alcohol unless approved by your health care provider.  This diet gives you most of the nutrients that you need for energy, but you may not get enough of certain vitamins, minerals, and fiber. Make sure to talk to your health care provider or dietitian about: ? How many calories you need to eat get day. ? How much fluid you should have each day. ? Taking a multivitamin or a nutritional supplement. What foods are allowed? The items listed may not be a complete list. Talk with your dietitian about what dietary choices are best for you. Grains Thin hot cereal, such as cream of wheat. Soft-cooked pasta or rice pured in soup. Vegetables Pulp-free tomato or vegetable juice. Vegetables pured in soup. Fruits Fruit juice without pulp. Strained fruit pures (seeds and skins removed). Meats and other protein foods Beef, chicken, and fish broths. Powdered protein supplements. Dairy Milk and milk-based beverages, including milk shakes and instant breakfast mixes. Smooth yogurt. Pured cottage cheese. Beverages Water. Coffee and tea (caffeinated or decaffeinated). Cocoa. Liquid nutritional supplements. Soft drinks. Nondairy milks, such as almond, coconut, rice, or soy milk. Fats and oils Melted margarine and butter. Cream. Canola, almond, avocado, corn, grapeseed, sunflower, and sesame oils. Gravy. Sweets and desserts Custard. Pudding. Flavored gelatin. Smooth ice cream (without nuts or candy pieces). Sherbet. Popsicles. [place] ice. Pudding pops. Seasoning and other foods Salt and pepper. Spices. Cocoa powder. Vinegar. Ketchup. Yellow mustard. Smooth sauces, such as Hollandaise, cheese sauce, or white sauce. Soy sauce. Cream soups. Strained soups. Syrup. Honey. Jelly (without fruit pieces). What foods are not  allowed? The items listed may not be a complete list. Talk with your dietitian about what dietary choices are best for you. Grains Whole grains. Pasta. Rice. Cold cereal. Bread. Crackers. Vegetables All whole fresh, frozen, or canned vegetables. Fruits All whole fresh, frozen, or canned fruits. Meats and other protein foods All cuts of meat, poultry, and fish. Eggs. Tofu and soy protein. Nuts and nut butters. Lunch meat. Sausage. Dairy Hard cheese. Yogurt with fruit chunks. Fats and oils Coconut oil. Palm oil. Lard. Cold butter. Sweets and desserts Ice cream or other frozen desserts that have any solids in them or on top, such as nuts, chocolate chips, and pieces of cookies. Cakes. Cookies. Candy. Seasoning and other foods Stone-ground mustards. Soups with chunks or pieces. Summary  A full liquid diet refers to fluids and foods that are liquid or will become liquid at room temperature.  This diet should only be used for a short period of time to help you recover from illness or surgery. Ask your health care provider or dietitian when it is safe for you to eat regular foods.  To make sure you get enough calories and nutrients, eat 3 meals each day with snacks between. Drink premade  nutrition supplement shakes or add protein powder to homemade shakes. Take a vitamin and mineral supplement as told by your health care provider. This information is not intended to replace advice given to you by your health care provider. Make sure you discuss any questions you have with your health care provider. Document Revised: 07/05/2017 Document Reviewed: 05/22/2016 Elsevier Patient Education  2020 [place] After These instructions provide you with information about caring for yourself after your procedure. Your health care provider may also give you more specific instructions. Your treatment has been planned according to current medical practices, but problems  sometimes occur. Call your health care provider if you have any problems or questions after your procedure. What can I expect after the procedure? After your procedure, you may:  Feel sleepy for several hours.  Feel clumsy and have poor balance for several hours.  Feel forgetful about what happened after the procedure.  Have poor judgment for several hours.  Feel nauseous or vomit.  Have a sore throat if you had a breathing tube during the procedure. Follow these instructions at home: For at least 24 hours after the procedure:      Have a responsible adult stay with you. It is important to have someone help care for you until you are awake and alert.  Rest as needed.  Do not: ? Participate in activities in which you could fall or become injured. ? Drive. ? Use heavy machinery. ? Drink alcohol. ? Take sleeping pills or medicines that cause drowsiness. ? Make important decisions or sign legal documents. ? Take care of children on your own. Eating and drinking  Follow the diet that is recommended by your health care provider.  If you vomit, drink water, juice, or soup when you can drink without vomiting.  Make sure you have little or no nausea before eating solid foods. General instructions  Take over-the-counter and prescription medicines only as told by your health care provider.  If you have sleep apnea, surgery and certain medicines can increase your risk for breathing problems. Follow instructions from your health care provider about wearing your sleep device: ? Anytime you are sleeping, including during daytime naps. ? While taking prescription pain medicines, sleeping medicines, or medicines that make you drowsy.  If you smoke, do not smoke without supervision.  Keep all follow-up visits as told by your health care provider. This is important. Contact a health care provider if:  You keep feeling nauseous or you keep vomiting.  You feel light-headed.  You  develop a rash.  You have a fever. Get help right away if:  You have trouble breathing. Summary  For several hours after your procedure, you may feel sleepy and have poor judgment.  Have a responsible adult stay with you for at least 24 hours or until you are awake and alert. This information is not intended to replace advice given to you by your health care provider. Make sure you discuss any questions you have with your health care provider. Document Revised: 07/07/2017 Document Reviewed: 07/30/2015 Elsevier Patient Education  [employer].

## 2019-06-17 NOTE — Telephone Encounter (Signed)
[facility] called and said pt needs contrast CT of Abd/Chest/Pelvis for esophageal mass.

## 2019-06-17 NOTE — H&P (Signed)
_0 @   Primary Care Physician:  [name], MD Primary Gastroenterologist:  Dr. [name]  Pre-Procedure History & Physical: HPI:  Steven Crane is a 59 y.o. male here for further evaluation of a longstanding GERD and now esophageal dysphagia. Omeprazole 40 mg daily previously worked very well to control his GERD.  He was switched to Protonix.  Even twice daily therapy has not controlled his reflux symptoms very well.  Describes esophageal dysphagia.  EGD with esophageal dilation as feasible/appropriate today per plan.  Past Medical History:  Diagnosis Date  . Anxiety   . Arthritis    "hands, back, knees" (08/27/2013)  . Asthma   . CHF (congestive heart failure) ([place]) 02/2010  . Chronic bronchitis ([place])    "get it q year"  . Chronic lower back pain   . Closed head injury 1975  . Coronary artery disease May 2015   s/p successful PTCA/DES x 1 to distal RCA and PTCA/DES x 1 to OM-20 Aug 2013  . Depression   . DVT (deep venous thrombosis) ([place]) 2008   "LLE"  . GERD (gastroesophageal reflux disease)   . Headache    hs of but no longer has problems   . History of kidney stones   . Hyperlipemia   . Hypertension   . Hypertriglyceridemia   . LV dysfunction    LVEF 40% at time of cardiac cath May 2015  . Morbid obesity ([place])   . Myocardial infarction ([place]) 02/2010  . Psoriasis   . Type II diabetes mellitus ([place])     Past Surgical History:  Procedure Laterality Date  . COLONOSCOPY  01/06/2012   Dr. [name]: suboptimal prep. normal exam. next colonoscopy in 10 years.   . CORONARY ANGIOPLASTY WITH STENT PLACEMENT  02/2010; 08/27/2013   "1 + 3"   . KNEE ARTHROSCOPY Left 1981  . LEFT HEART CATHETERIZATION WITH CORONARY ANGIOGRAM N/A 08/27/2013   Procedure: LEFT HEART CATHETERIZATION WITH CORONARY ANGIOGRAM;  Surgeon: [name], MD;  Location: [facility] C[facility] LAB;  Service: Cardiovascular;  Laterality: N/A;  . right wrist fracture surgery     . TIBIA IM NAIL INSERTION Left  08/21/2018   Procedure: INTRAMEDULLARY (IM) NAIL TIBIAL;  Surgeon: Rod Can, MD;  Location: [facility] ORS;  Service: Orthopedics;  Laterality: Left;    Prior to Admission medications   Medication Sig Start Date End Date Taking? Authorizing Provider  acetaminophen (TYLENOL) 500 MG tablet Take 1,000 mg by mouth every 8 (eight) hours as needed for moderate pain.    Yes [provider]  albuterol (VENTOLIN HFA) 108 (90 Base) MCG/ACT inhaler INHALE 2 PUFFS BY MOUTH EVERY 6 HOURS AS NEEDED FOR WHEEZING FOR SHORTNESS OF BREATH Patient taking differently: Inhale 2 puffs into the lungs every 6 (six) hours as needed for wheezing or shortness of breath.  04/29/19  Yes [name], MD  ALPRAZolam Duanne Moron) 0.5 MG tablet TAKE ONE-HALF TO ONE TABLET BY MOUTH ONCE DAILY AS NEEDED Patient taking differently: Take 0.25-0.5 mg by mouth daily as needed for anxiety. TAKE ONE-HALF TO ONE TABLET BY MOUTH ONCE DAILY AS NEEDED 04/09/18  Yes [name], MD  amLODipine (NORVASC) 5 MG tablet Take 1 tablet by mouth once daily Patient taking differently: Take 5 mg by mouth daily.  04/13/19  Yes [name], MD  aspirin EC 81 MG tablet Take 1 tablet (81 mg total) by mouth daily. 04/10/18  Yes Herminio Commons, MD  atorvastatin (LIPITOR) 40 MG tablet Take 1  tablet (40 mg total) by mouth daily. 04/10/18 05/31/20 Yes Herminio Commons, MD  enalapril (VASOTEC) 20 MG tablet Take 1 tablet (20 mg total) by mouth 2 (two) times daily. 04/01/19  Yes [name], MD  fenofibrate 160 MG tablet Take 1 tablet by mouth once daily 04/29/19  Yes Luking, Grace Bushy, MD  glyBURIDE-metformin (GLUCOVANCE) 5-500 MG tablet Take 2 tablets by mouth 2 (two) times daily. 01/18/19  Yes [name], MD  insulin detemir (LEVEMIR) 100 UNIT/ML injection INJECT 70 units BID Patient taking differently: Inject 70 Units into the skin 2 (two) times daily.  01/13/19  Yes [name], MD  metoprolol succinate (TOPROL-XL) 50  MG 24 hr tablet Take 1 tablet (50 mg total) by mouth daily. Take with or immediately following a meal. 07/15/18  Yes [name], MD  pantoprazole (PROTONIX) 40 MG tablet Take 1 tablet (40 mg total) by mouth 2 (two) times daily before a meal. 05/05/19  Yes Mahala Menghini, PA-C  albuterol (PROVENTIL HFA;VENTOLIN HFA) 108 (90 Base) MCG/ACT inhaler Inhale 2 puffs into the lungs every 6 (six) hours as needed for wheezing or shortness of breath. Patient not taking: Reported on 06/10/2019 01/28/17   Kathyrn Drown, MD  blood glucose meter kit and supplies Test glucose once daily. ICD 10 E11.9 09/26/17   [name], MD    Allergies as of 05/05/2019 - Review Complete 05/05/2019  Allergen Reaction Noted  . Adhesive [tape] Rash 12/24/2011    Family History  Problem Relation Age of Onset  . Coronary artery disease Father   . Diabetes type II Father   . Diabetes Father   . Heart attack Father   . Hypertension Mother     Social History   Socioeconomic History  . Marital status: Single    Spouse name: Not on file  . Number of children: Not on file  . Years of education: Not on file  . Highest education level: Not on file  Occupational History  . Not on file  Tobacco Use  . Smoking status: Never Smoker  . Smokeless tobacco: Current User    Types: Snuff, Chew  . Tobacco comment: "started dipping @ age 65; quit for 5 1/2 yrs at one time; hadn't chewed in awhile"  Substance and Sexual Activity  . Alcohol use: Yes    Alcohol/week: 0.0 standard drinks    Comment: occ   . Drug use: No  . Sexual activity: Yes  Other Topics Concern  . Not on file  Social History Narrative  . Not on file   Social Determinants of Health   Financial Resource Strain:   . Difficulty of Paying Living Expenses: Not on file  Food Insecurity:   . Worried About Charity fundraiser in the Last Year: Not on file  . Ran Out of Food in the Last Year: Not on file  Transportation Needs:   . Lack of  Transportation (Medical): Not on file  . Lack of Transportation (Non-Medical): Not on file  Physical Activity:   . Days of Exercise per Week: Not on file  . Minutes of Exercise per Session: Not on file  Stress:   . Feeling of Stress : Not on file  Social Connections:   . Frequency of Communication with Friends and Family: Not on file  . Frequency of Social Gatherings with Friends and Family: Not on file  . Attends Religious Services: Not on file  . Active Member of Clubs  or Organizations: Not on file  . Attends Archivist Meetings: Not on file  . Marital Status: Not on file  Intimate Partner Violence:   . Fear of Current or Ex-Partner: Not on file  . Emotionally Abused: Not on file  . Physically Abused: Not on file  . Sexually Abused: Not on file    Review of Systems: See HPI, otherwise negative ROS  Physical Exam: BP 132/76   Pulse 70   Resp 18   SpO2 98%  General:   Alert,  Well-developed, well-nourished, pleasant and cooperative in NAD Neck:  Supple; no masses or thyromegaly. No significant cervical adenopathy. Lungs:  Clear throughout to auscultation.   No wheezes, crackles, or rhonchi. No acute distress. Heart:  Regular rate and rhythm; no murmurs, clicks, rubs,  or gallops. Abdomen: Non-distended, normal bowel sounds.  Soft and nontender without appreciable mass or hepatosplenomegaly.  Pulses:  Normal pulses noted. Extremities:  Without clubbing or edema.  Impression/Plan: 59 year old gentleman longstanding GERD now poorly controlled and esophageal dysphagia.  EGD now being performed per plan.  The risks, benefits, limitations, alternatives and imponderables have been reviewed with the patient. Potential for esophageal dilation, biopsy, etc. have also been reviewed.  Questions have been answered. All parties agreeable.     Notice: This dictation was prepared with Dragon dictation along with smaller phrase technology. Any transcriptional errors that result  from this process are unintentional and may not be corrected upon review.

## 2019-06-17 NOTE — Anesthesia Postprocedure Evaluation (Signed)
Anesthesia Post Note  Patient: Steven Crane  Procedure(s) Performed: ESOPHAGOGASTRODUODENOSCOPY (EGD) WITH PROPOFOL (N/A ) BIOPSY  Patient location during evaluation: PACU Anesthesia Type: General Level of consciousness: awake and alert and patient cooperative Pain management: satisfactory to patient Respiratory status: spontaneous breathing Cardiovascular status: stable Postop Assessment: no apparent nausea or vomiting Anesthetic complications: no     Last Vitals:  Vitals:   06/17/19 0948 06/17/19 1109  BP: 132/76   Pulse: 70   Resp: 18   Temp:  36.5 C  SpO2: 98%     Last Pain:  Vitals:   06/17/19 1109  TempSrc:   PainSc: 0-No pain                 Secundino Ellithorpe

## 2019-06-18 ENCOUNTER — Telehealth: Payer: Self-pay

## 2019-06-18 LAB — SURGICAL PATHOLOGY

## 2019-06-18 NOTE — Telephone Encounter (Signed)
Heather informed of PA.

## 2019-06-18 NOTE — Telephone Encounter (Signed)
Heather at pre-service center contacted office. PA needed for CT chest/abd/pelvis.  PA submitted via [employer] website. Cases approved. PA for CT chest: [id number], valid 06/18/19-12/14/19. PA for CT abd/pelvis: [id number], valid 06/18/19-12/14/19.

## 2019-06-21 ENCOUNTER — Ambulatory Visit (HOSPITAL_COMMUNITY)
Admission: RE | Admit: 2019-06-21 | Discharge: 2019-06-21 | Disposition: A | Discharge status: Home / Self Care | Payer: BC Managed Care – PPO | Source: Ambulatory Visit | Attending: Internal Medicine | Admitting: Internal Medicine

## 2019-06-21 ENCOUNTER — Other Ambulatory Visit: Payer: Self-pay

## 2019-06-21 DIAGNOSIS — K228 Other specified diseases of esophagus: Secondary | ICD-10-CM | POA: Diagnosis not present

## 2019-06-21 DIAGNOSIS — K2289 Other specified disease of esophagus: Secondary | ICD-10-CM

## 2019-06-21 DIAGNOSIS — C159 Malignant neoplasm of esophagus, unspecified: Secondary | ICD-10-CM

## 2019-06-21 MED ORDER — IOHEXOL 300 MG/ML  SOLN
100.0000 mL | Freq: Once | INTRAMUSCULAR | Status: AC | PRN
  Administered 2019-06-21: 100 mL via INTRAVENOUS

## 2019-06-22 ENCOUNTER — Inpatient Hospital Stay (HOSPITAL_COMMUNITY): Payer: BC Managed Care – PPO | Attending: Hematology | Admitting: Hematology

## 2019-06-22 ENCOUNTER — Encounter (HOSPITAL_COMMUNITY): Payer: Self-pay | Admitting: Hematology

## 2019-06-22 ENCOUNTER — Inpatient Hospital Stay (HOSPITAL_COMMUNITY): Payer: BC Managed Care – PPO | Admitting: Hematology

## 2019-06-22 DIAGNOSIS — K760 Fatty (change of) liver, not elsewhere classified: Secondary | ICD-10-CM | POA: Diagnosis not present

## 2019-06-22 DIAGNOSIS — C160 Malignant neoplasm of cardia: Secondary | ICD-10-CM | POA: Diagnosis present

## 2019-06-22 DIAGNOSIS — Z79899 Other long term (current) drug therapy: Secondary | ICD-10-CM | POA: Diagnosis not present

## 2019-06-22 DIAGNOSIS — R1310 Dysphagia, unspecified: Secondary | ICD-10-CM | POA: Diagnosis not present

## 2019-06-22 DIAGNOSIS — R634 Abnormal weight loss: Secondary | ICD-10-CM | POA: Diagnosis not present

## 2019-06-22 NOTE — Assessment & Plan Note (Addendum)
1.  GE junction adenocarcinoma: -Patient with GERD symptoms for many years. -Recent onset dysphagia for the past 3 months with food getting stuck behind the sternum.  Weight loss of 30 pounds in the last 3 months. -EGD on 05/28/2019 by Dr. [name] showed mass in the distal esophagus extending from 33 cm down to 38 cm (GE junction) with an additional 3 cm component involving the proximal cardia of the stomach. -We reviewed results of CT chest, abdomen and pelvis from 06/21/2019.  Circumferential endoluminal mass of the lower esophagus and GE junction.  Prominent gastrohepatic lymph nodes, measuring 1.6 x 0.7 cm.  No other evidence of metastatic disease.  Hepatic steatosis seen. -I have recommended a PET CT scan to evaluate for metastatic disease. -We will also make referral to Dr. Kipp Brood for opinion regarding surgery. -Patient will also be referred for radiation oncology consultation. -I will see him back after the PET scan to discuss further options.  2.  Weight loss/dysphagia: -He lost 30 pounds in the last 3 months. -He does have dysphagia to solid foods.  He has been on liquid diet since his endoscopy. -I have suggested pured foods.  We will make a nutrition consultation.  3.  Family history: -Patient has extensive family history with one maternal aunt and 2 maternal uncles who died of cancer.  One paternal aunt died of lung cancer.  Multiple maternal cousins died of cancers.  Patient does not know exact types. -He will benefit from genetic evaluation and testing.  3.  Diabetes: -He is on Glucovance 2 tablets twice daily.  He also takes Levemir 70 units twice daily.

## 2019-06-22 NOTE — Patient Instructions (Addendum)
[place] at [facility] Discharge Instructions  You were seen today by Dr. Delton Coombes. He went over your recent history, family history and how you've been feeling lately. He will schedule you for a PET scan. He will schedule you for a visit with our nutritionist to go over nutrition. He will refer you to a surgeon in [place] for evaluation for surgery. He will refer you to radiation in [place]. He will see you back after your scan for follow up.   Thank you for choosing [place] at [facility] to provide your oncology and hematology care.  To afford each patient quality time with our provider, please arrive at least 15 minutes before your scheduled appointment time.   If you have a lab appointment with the [place] please come in thru the  Main Entrance and check in at the main information desk  You need to re-schedule your appointment should you arrive 10 or more minutes late.  We strive to give you quality time with our providers, and arriving late affects you and other patients whose appointments are after yours.  Also, if you no show three or more times for appointments you may be dismissed from the clinic at the providers discretion.     Again, thank you for choosing [facility].  Our hope is that these requests will decrease the amount of time that you wait before being seen by our physicians.       _____________________________________________________________  Should you have questions after your visit to [facility], please contact our office at [phone] between the hours of 8:00 a.m. and 4:30 p.m.  Voicemails left after 4:00 p.m. will not be returned until the following business day.  For prescription refill requests, have your pharmacy contact our office and allow 72 hours.    Cancer Center Support Programs:   > Cancer Support Group  2nd Tuesday of the month 1pm-2pm, Journey Room

## 2019-06-22 NOTE — Progress Notes (Signed)
[facility] NOTE  Patient Care Team: [name], MD as PCP - General (Family Medicine) Herminio Commons, MD as PCP - Cardiology (Cardiology)  CHIEF COMPLAINTS/PURPOSE OF CONSULTATION:  GE junction adenocarcinoma.  HISTORY OF PRESENTING ILLNESS:  Steven Crane 59 y.o. male is seen in consultation today at the request of Dr. [name] for GE junction adenocarcinoma.  Patient had GERD symptoms for many years.  For the last 3 months he has been having food getting stuck behind the retrosternal area.  He denies any difficulty swallowing.  He was evaluated by Dr. [name] and an endoscopy was done on 05/28/2019 which showed a mass in the distal esophagus extending from 33 cm down to 38 cm (GE junction) with additional 3 cm competent involving the proximal cardia of the stomach.  He has also lost 30 pounds in the last 3 months.  He denies any cigarette smoking but chews tobacco since age 31.  He drinks alcohol occasionally.  He works for [employer].  He had a history of MI in 2011 and stents were placed.  He has diabetes for which he is on Levemir 70 units twice daily.  He denies any tingling or numbness in extremities.  He is seen along with his son today.  Denies any new onset pains.  A CT scan of the chest, abdomen and pelvis was done on 06/21/2019.  He has been on liquid diet since the endoscopy.  Reports appetite and energy levels of 75%.  MEDICAL HISTORY:  Past Medical History:  Diagnosis Date  . Anxiety   . Arthritis    "hands, back, knees" (08/27/2013)  . Asthma   . CHF (congestive heart failure) ([place]) 02/2010  . Chronic bronchitis ([place])    "get it q year"  . Chronic lower back pain   . Closed head injury 1975  . Coronary artery disease May 2015   s/p successful PTCA/DES x 1 to distal RCA and PTCA/DES x 1 to OM-20 Aug 2013  . Depression   . DVT (deep venous thrombosis) ([place]) 2008   "LLE"  . GERD (gastroesophageal reflux disease)   . Headache     hs of but no longer has problems   . History of kidney stones   . Hyperlipemia   . Hypertension   . Hypertriglyceridemia   . LV dysfunction    LVEF 40% at time of cardiac cath May 2015  . Morbid obesity ([place])   . Myocardial infarction ([place]) 02/2010  . Psoriasis   . Type II diabetes mellitus ([place])     SURGICAL HISTORY: Past Surgical History:  Procedure Laterality Date  . BIOPSY  06/17/2019   Procedure: BIOPSY;  Surgeon: [name], MD;  [facility];  Service: Endoscopy;;  . COLONOSCOPY  01/06/2012   Dr. [name]: suboptimal prep. normal exam. next colonoscopy in 10 years.   . CORONARY ANGIOPLASTY WITH STENT PLACEMENT  02/2010; 08/27/2013   "1 + 3"   . ESOPHAGOGASTRODUODENOSCOPY (EGD) WITH PROPOFOL N/A 06/17/2019   Procedure: ESOPHAGOGASTRODUODENOSCOPY (EGD) WITH PROPOFOL;  Surgeon: [name], MD;  Location[facility];  Service: Endoscopy;  Laterality: N/A;  11:15am  . KNEE ARTHROSCOPY Left 1981  . LEFT HEART CATHETERIZATION WITH CORONARY ANGIOGRAM N/A 08/27/2013   Procedure: LEFT HEART CATHETERIZATION WITH CORONARY ANGIOGRAM;  Surgeon: [name], MD;  Location: [facility] CATH LAB;  Service: Cardiovascular;  Laterality: N/A;  . right wrist fracture surgery     . TIBIA IM  NAIL INSERTION Left 08/21/2018   Procedure: INTRAMEDULLARY (IM) NAIL TIBIAL;  Surgeon: Rod Can, MD;  Location: [facility] ORS;  Service: Orthopedics;  Laterality: Left;    SOCIAL HISTORY: Social History   Socioeconomic History  . Marital status: Single    Spouse name: Not on file  . Number of children: 3  . Years of education: Not on file  . Highest education level: Not on file  Occupational History  . Not on file  Tobacco Use  . Smoking status: Never Smoker  . Smokeless tobacco: Current User    Types: Snuff, Chew  . Tobacco comment: "started dipping @ age 77; quit for 5 1/2 yrs at one time; hadn't chewed in awhile"  Substance and Sexual Activity  . Alcohol use: Yes    Alcohol/week:  0.0 standard drinks    Comment: occ   . Drug use: No  . Sexual activity: Yes  Other Topics Concern  . Not on file  Social History Narrative  . Not on file   Social Determinants of Health   Financial Resource Strain: Low Risk   . Difficulty of Paying Living Expenses: Not hard at all  Food Insecurity: No Food Insecurity  . Worried About Charity fundraiser in the Last Year: Never true  . Ran Out of Food in the Last Year: Never true  Transportation Needs: No Transportation Needs  . Lack of Transportation (Medical): No  . Lack of Transportation (Non-Medical): No  Physical Activity: Insufficiently Active  . Days of Exercise per Week: 1 day  . Minutes of Exercise per Session: 20 min  Stress: No Stress Concern Present  . Feeling of Stress : Not at all  Social Connections: Slightly Isolated  . Frequency of Communication with Friends and Family: Three times a week  . Frequency of Social Gatherings with Friends and Family: Three times a week  . Attends Religious Services: 1 to 4 times per year  . Active Member of Clubs or Organizations: No  . Attends Archivist Meetings: Never  . Marital Status: Living with partner  Intimate Partner Violence: Not At Risk  . Fear of Current or Ex-Partner: No  . Emotionally Abused: No  . Physically Abused: No  . Sexually Abused: No    FAMILY HISTORY: Family History  Problem Relation Age of Onset  . Coronary artery disease Father   . Diabetes type II Father   . Diabetes Father   . Heart attack Father   . Hypertension Mother     ALLERGIES:  is allergic to adhesive [tape].  MEDICATIONS:  Current Outpatient Medications  Medication Sig Dispense Refill  . acetaminophen (TYLENOL) 500 MG tablet Take 1,000 mg by mouth every 8 (eight) hours as needed for moderate pain.     Marland Kitchen albuterol (PROVENTIL HFA;VENTOLIN HFA) 108 (90 Base) MCG/ACT inhaler Inhale 2 puffs into the lungs every 6 (six) hours as needed for wheezing or shortness of breath.  (Patient not taking: Reported on 06/10/2019) 1 Inhaler 2  . albuterol (VENTOLIN HFA) 108 (90 Base) MCG/ACT inhaler INHALE 2 PUFFS BY MOUTH EVERY 6 HOURS AS NEEDED FOR WHEEZING FOR SHORTNESS OF BREATH (Patient not taking: No sig reported) 18 g 0  . ALPRAZolam (XANAX) 0.5 MG tablet TAKE ONE-HALF TO ONE TABLET BY MOUTH ONCE DAILY AS NEEDED (Patient not taking: Reported on 06/22/2019) 30 tablet 5  . amLODipine (NORVASC) 5 MG tablet Take 1 tablet by mouth once daily (Patient taking differently: Take 5 mg by mouth daily. ) 90  tablet 0  . aspirin EC 81 MG tablet Take 1 tablet (81 mg total) by mouth daily. 90 tablet 3  . atorvastatin (LIPITOR) 40 MG tablet Take 1 tablet (40 mg total) by mouth daily. 90 tablet 3  . blood glucose meter kit and supplies Test glucose once daily. ICD 10 E11.9 1 each 5  . dexlansoprazole (DEXILANT) 60 MG capsule Take 60 mg by mouth daily.    . enalapril (VASOTEC) 20 MG tablet Take 1 tablet (20 mg total) by mouth 2 (two) times daily. 180 tablet 1  . fenofibrate 160 MG tablet Take 1 tablet by mouth once daily 90 tablet 0  . glyBURIDE-metformin (GLUCOVANCE) 5-500 MG tablet Take 2 tablets by mouth 2 (two) times daily. 360 tablet 0  . metoprolol succinate (TOPROL-XL) 50 MG 24 hr tablet Take 1 tablet (50 mg total) by mouth daily. Take with or immediately following a meal. 90 tablet 1   No current facility-administered medications for this visit.    REVIEW OF SYSTEMS:   Constitutional: Denies fevers, chills or abnormal night sweats Eyes: Denies blurriness of vision, double vision or watery eyes Ears, nose, mouth, throat, and face: Denies mucositis or sore throat Respiratory: Denies cough, dyspnea or wheezes Cardiovascular: Denies palpitation, chest discomfort or lower extremity swelling Gastrointestinal: Denies any nausea or vomiting.  Positive for 30 pound weight loss in the last 3 months.  Positive for food getting stuck behind the sternal area. Skin: Denies abnormal skin  rashes Lymphatics: Denies new lymphadenopathy or easy bruising Neurological:Denies numbness, tingling or new weaknesses Behavioral/Psych: Mood is stable, no new changes  All other systems were reviewed with the patient and are negative.  PHYSICAL EXAMINATION: ECOG PERFORMANCE STATUS: 0 - Asymptomatic  There were no vitals filed for this visit. There were no vitals filed for this visit.  GENERAL:alert, no distress and comfortable SKIN: skin color, texture, turgor are normal, no rashes or significant lesions EYES: normal, conjunctiva are pink and non-injected, sclera clear OROPHARYNX:no exudate, no erythema and lips, buccal mucosa, and tongue normal  NECK: supple, thyroid normal size, non-tender, without nodularity LYMPH:  no palpable lymphadenopathy in the cervical, axillary or inguinal LUNGS: clear to auscultation and percussion with normal breathing effort HEART: regular rate & rhythm and no murmurs and no lower extremity edema ABDOMEN:abdomen soft, non-tender and normal bowel sounds Musculoskeletal:no cyanosis of digits and no clubbing  PSYCH: alert & oriented x 3 with fluent speech NEURO: no focal motor/sensory deficits  LABORATORY DATA:  I have reviewed the data as listed Lab Results  Component Value Date   WBC 6.9 08/22/2018   HGB 11.6 (L) 08/22/2018   HCT 36.3 (L) 08/22/2018   MCV 87.7 08/22/2018   PLT 225 08/22/2018     Chemistry      Component Value Date/Time   NA 133 (L) 06/15/2019 1415   NA 136 07/10/2018 0808   K 4.3 06/15/2019 1415   CL 101 06/15/2019 1415   CO2 26 06/15/2019 1415   BUN 9 06/15/2019 1415   BUN 12 07/10/2018 0808   CREATININE 0.70 06/15/2019 1415   CREATININE 0.72 01/13/2014 0828      Component Value Date/Time   CALCIUM 8.6 (L) 06/15/2019 1415   ALKPHOS 56 01/08/2019 0824   AST 20 01/08/2019 0824   ALT 39 01/08/2019 0824   BILITOT 0.3 01/08/2019 0824       RADIOGRAPHIC STUDIES: I have personally reviewed the radiological images as  listed and agreed with the findings in the report. CT  CHEST W CONTRAST  Result Date: 06/21/2019 CLINICAL DATA:  Dysphagia, esophageal mass identified by endoscopy EXAM: CT CHEST, ABDOMEN, AND PELVIS WITH CONTRAST TECHNIQUE: Multidetector CT imaging of the chest, abdomen and pelvis was performed following the standard protocol during bolus administration of intravenous contrast. CONTRAST:  125m OMNIPAQUE IOHEXOL 300 MG/ML SOLN, additional oral enteric contrast COMPARISON:  04/04/2013 FINDINGS: CT CHEST FINDINGS Cardiovascular: No significant vascular findings. Normal heart size. Extensive 3 vessel coronary artery calcifications and/or stents. No pericardial effusion. Mediastinum/Nodes: No enlarged mediastinal, hilar, or axillary lymph nodes. There is a circumferential endoluminal mass of the lower esophagus and gastroesophageal junction measuring approximately 3.2 x 3.1 x 5.5 cm (series 2, image 47). There is minimal adjacent fat stranding (series 2, image 50). Thyroid gland and trachea demonstrate no significant findings. Lungs/Pleura: Lungs are clear. No pleural effusion or pneumothorax. Musculoskeletal: No chest wall mass or suspicious bone lesions identified. CT ABDOMEN PELVIS FINDINGS Hepatobiliary: No solid liver abnormality is seen. Hepatic steatosis. No gallstones, gallbladder wall thickening, or biliary dilatation. Pancreas: Unremarkable. No pancreatic ductal dilatation or surrounding inflammatory changes. Spleen: Normal in size without significant abnormality. Adrenals/Urinary Tract: Adrenal glands are unremarkable. Small nonobstructive bilateral renal calculi. No hydronephrosis. Bladder is unremarkable. Stomach/Bowel: Stomach is within normal limits. Appendix appears normal. No evidence of bowel wall thickening, distention, or inflammatory changes. Vascular/Lymphatic: Minimal aortic atherosclerosis. There are subtle, prominent gastrohepatic ligament nodes, which are enlarged compared to prior  examination and measure up to 1.6 x 0.7 cm (series 2, image 55). Reproductive: No mass or other abnormality. Other: Small, fat containing umbilical hernia. No abdominopelvic ascites. Musculoskeletal: No acute or significant osseous findings. IMPRESSION: 1. Circumferential endoluminal mass of the lower esophagus and gastroesophageal junction, with minimal adjacent fat stranding, in keeping with mass identified by endoscopy. 2. Subtle, prominent gastrohepatic ligament lymph nodes, which measure up to 1.6 x 0.7 cm. These are suspicious for nodal metastatic involvement given enlargement compared to prior examination. 3. No other evidence of metastatic disease in the chest, abdomen, or pelvis. 4. Hepatic steatosis. 5. Nonobstructing nephrolithiasis. 6. Coronary artery disease.  Aortic Atherosclerosis (ICD10-I70.0). Electronically Signed   By: AEddie CandleM.D.   On: 06/21/2019 13:01   CT Abdomen Pelvis W Contrast  Result Date: 06/21/2019 CLINICAL DATA:  Dysphagia, esophageal mass identified by endoscopy EXAM: CT CHEST, ABDOMEN, AND PELVIS WITH CONTRAST TECHNIQUE: Multidetector CT imaging of the chest, abdomen and pelvis was performed following the standard protocol during bolus administration of intravenous contrast. CONTRAST:  1069mOMNIPAQUE IOHEXOL 300 MG/ML SOLN, additional oral enteric contrast COMPARISON:  04/04/2013 FINDINGS: CT CHEST FINDINGS Cardiovascular: No significant vascular findings. Normal heart size. Extensive 3 vessel coronary artery calcifications and/or stents. No pericardial effusion. Mediastinum/Nodes: No enlarged mediastinal, hilar, or axillary lymph nodes. There is a circumferential endoluminal mass of the lower esophagus and gastroesophageal junction measuring approximately 3.2 x 3.1 x 5.5 cm (series 2, image 47). There is minimal adjacent fat stranding (series 2, image 50). Thyroid gland and trachea demonstrate no significant findings. Lungs/Pleura: Lungs are clear. No pleural effusion or  pneumothorax. Musculoskeletal: No chest wall mass or suspicious bone lesions identified. CT ABDOMEN PELVIS FINDINGS Hepatobiliary: No solid liver abnormality is seen. Hepatic steatosis. No gallstones, gallbladder wall thickening, or biliary dilatation. Pancreas: Unremarkable. No pancreatic ductal dilatation or surrounding inflammatory changes. Spleen: Normal in size without significant abnormality. Adrenals/Urinary Tract: Adrenal glands are unremarkable. Small nonobstructive bilateral renal calculi. No hydronephrosis. Bladder is unremarkable. Stomach/Bowel: Stomach is within normal limits. Appendix appears normal. No evidence of  bowel wall thickening, distention, or inflammatory changes. Vascular/Lymphatic: Minimal aortic atherosclerosis. There are subtle, prominent gastrohepatic ligament nodes, which are enlarged compared to prior examination and measure up to 1.6 x 0.7 cm (series 2, image 55). Reproductive: No mass or other abnormality. Other: Small, fat containing umbilical hernia. No abdominopelvic ascites. Musculoskeletal: No acute or significant osseous findings. IMPRESSION: 1. Circumferential endoluminal mass of the lower esophagus and gastroesophageal junction, with minimal adjacent fat stranding, in keeping with mass identified by endoscopy. 2. Subtle, prominent gastrohepatic ligament lymph nodes, which measure up to 1.6 x 0.7 cm. These are suspicious for nodal metastatic involvement given enlargement compared to prior examination. 3. No other evidence of metastatic disease in the chest, abdomen, or pelvis. 4. Hepatic steatosis. 5. Nonobstructing nephrolithiasis. 6. Coronary artery disease.  Aortic Atherosclerosis (ICD10-I70.0). Electronically Signed   By: Eddie Candle M.D.   On: 06/21/2019 13:01    ASSESSMENT & PLAN:  Primary adenocarcinoma of gastroesophageal junction (HCC) 1.  GE junction adenocarcinoma: -Patient with GERD symptoms for many years. -Recent onset dysphagia for the past 3 months with  food getting stuck behind the sternum.  Weight loss of 30 pounds in the last 3 months. -EGD on 05/28/2019 by Dr. [name] showed mass in the distal esophagus extending from 33 cm down to 38 cm (GE junction) with an additional 3 cm component involving the proximal cardia of the stomach. -We reviewed results of CT chest, abdomen and pelvis from 06/21/2019.  Circumferential endoluminal mass of the lower esophagus and GE junction.  Prominent gastrohepatic lymph nodes, measuring 1.6 x 0.7 cm.  No other evidence of metastatic disease.  Hepatic steatosis seen. -I have recommended a PET CT scan to evaluate for metastatic disease. -We will also make referral to Dr. Kipp Brood for opinion regarding surgery. -Patient will also be referred for radiation oncology consultation. -I will see him back after the PET scan to discuss further options.  2.  Weight loss/dysphagia: -He lost 30 pounds in the last 3 months. -He does have dysphagia to solid foods.  He has been on liquid diet since his endoscopy. -I have suggested pured foods.  We will make a nutrition consultation.  3.  Family history: -Patient has extensive family history with one maternal aunt and 2 maternal uncles who died of cancer.  One paternal aunt died of lung cancer.  Multiple maternal cousins died of cancers.  Patient does not know exact types. -He will benefit from genetic evaluation and testing.  3.  Diabetes: -He is on Glucovance 2 tablets twice daily.  He also takes Levemir 70 units twice daily.  No orders of the defined types were placed in this encounter.   All questions were answered. The patient knows to call the clinic with any problems, questions or concerns.      Derek Jack, MD 06/22/2019 1:13 PM

## 2019-06-22 NOTE — Progress Notes (Signed)
Erroneous entry

## 2019-07-02 ENCOUNTER — Ambulatory Visit (HOSPITAL_COMMUNITY): Payer: BC Managed Care – PPO

## 2019-07-05 ENCOUNTER — Ambulatory Visit (HOSPITAL_COMMUNITY): Payer: BC Managed Care – PPO

## 2019-07-06 ENCOUNTER — Ambulatory Visit (HOSPITAL_COMMUNITY): Payer: BC Managed Care – PPO | Admitting: Hematology

## 2019-07-09 ENCOUNTER — Encounter: Payer: Self-pay | Admitting: Thoracic Surgery (Cardiothoracic Vascular Surgery)

## 2019-07-09 ENCOUNTER — Institutional Professional Consult (permissible substitution): Payer: BC Managed Care – PPO | Admitting: Thoracic Surgery (Cardiothoracic Vascular Surgery)

## 2019-07-09 ENCOUNTER — Other Ambulatory Visit: Payer: Self-pay

## 2019-07-09 ENCOUNTER — Ambulatory Visit (HOSPITAL_COMMUNITY): Payer: BC Managed Care – PPO

## 2019-07-09 VITALS — BP 110/66 | HR 70 | Temp 97.8°F | Resp 20 | Ht 74.0 in | Wt 285.0 lb

## 2019-07-09 DIAGNOSIS — Z9861 Coronary angioplasty status: Secondary | ICD-10-CM

## 2019-07-09 DIAGNOSIS — C160 Malignant neoplasm of cardia: Secondary | ICD-10-CM

## 2019-07-09 DIAGNOSIS — I2510 Atherosclerotic heart disease of native coronary artery without angina pectoris: Secondary | ICD-10-CM

## 2019-07-09 NOTE — Progress Notes (Signed)
Nutrition  Referral from Dr. Anthony Sar patient for nutrition assessment.  No answer.  Left message with call back number.   Jadon Ressler B. Zenia Resides, [place], [place] Registered Dietitian [phone] (pager)

## 2019-07-09 NOTE — Progress Notes (Signed)
[address]       [place] [address]             [phone]                    Cayce R Heffernan [place] Medical Record #[id number] Date of Birth: Apr 15, 1961  Referring: Derek Jack, MD Primary Care: [name], MD Primary Cardiologist: Kate Sable, MD  Chief Complaint:    Chief Complaint  Patient presents with  . Esophageal Cancer    Surgical eval, PET Scan pending 07/16/19, C/A/P CT 06/21/19, Upper ENDO 06/17/19, EGD 06/17/19    History of Present Illness:    Steven Crane 59 y.o. male referred for surgical evaluation of a biopsy-proven adenocarcinoma of the GE junction.  He has a long history of gastroesophageal reflux disease over the last several months has developed progressive dysphagia and odynophagia.  He subsequently underwent an EGD which identified the GE junction tumor.  He has lost approximately 35 pounds over the last 2 to 3 months.  He also has a history of coronary artery disease and PCI but denies any anginal symptoms.  He has continued to work through this without significant symptoms.    Past Medical History:  Diagnosis Date  . Anxiety   . Arthritis    "hands, back, knees" (08/27/2013)  . Asthma   . CHF (congestive heart failure) ([place]) 02/2010  . Chronic bronchitis ([place])    "get it q year"  . Chronic lower back pain   . Closed head injury 1975  . Coronary artery disease May 2015   s/p successful PTCA/DES x 1 to distal RCA and PTCA/DES x 1 to OM-20 Aug 2013  . Depression   . DVT (deep venous thrombosis) ([place]) 2008   "LLE"  . GERD (gastroesophageal reflux disease)   . Headache    hs of but no longer has problems   . History of kidney stones   . Hyperlipemia   . Hypertension   . Hypertriglyceridemia   . LV dysfunction    LVEF 40% at time of cardiac cath May 2015  . Morbid obesity ([place])   . Myocardial infarction ([place]) 02/2010  . Psoriasis   . Type II diabetes mellitus ([place])     Past Surgical History:    Procedure Laterality Date  . BIOPSY  06/17/2019   Procedure: BIOPSY;  Surgeon: [name], MD;  Location: [facility];  Service: Endoscopy;;  . COLONOSCOPY  01/06/2012   Dr. [name]: suboptimal prep. normal exam. next colonoscopy in 10 years.   . CORONARY ANGIOPLASTY WITH STENT PLACEMENT  02/2010; 08/27/2013   "1 + 3"   . ESOPHAGOGASTRODUODENOSCOPY (EGD) WITH PROPOFOL N/A 06/17/2019   Procedure: ESOPHAGOGASTRODUODENOSCOPY (EGD) WITH PROPOFOL;  Surgeon: [name], MD;  Location: [facility];  Service: Endoscopy;  Laterality: N/A;  11:15am  . KNEE ARTHROSCOPY Left 1981  . LEFT HEART CATHETERIZATION WITH CORONARY ANGIOGRAM N/A 08/27/2013   Procedure: LEFT HEART CATHETERIZATION WITH CORONARY ANGIOGRAM;  Surgeon: [name], MD;  Location: [facility] CATH LAB;  Service: Cardiovascular;  Laterality: N/A;  . right wrist fracture surgery     . TIBIA IM NAIL INSERTION Left 08/21/2018   Procedure: INTRAMEDULLARY (IM) NAIL TIBIAL;  Surgeon: Rod Can, MD;  Location: [facility] ORS;  Service: Orthopedics;  Laterality: Left;    Family History  Problem Relation Age of Onset  . Coronary artery disease Father   . Diabetes type  II Father   . Diabetes Father   . Heart attack Father   . Hypertension Mother      Social History   Tobacco Use  Smoking Status Never Smoker  Smokeless Tobacco Current User  . Types: Snuff, Chew  Tobacco Comment   "started dipping @ age 48; quit for 5 1/2 yrs at one time; hadn't chewed in awhile"    Social History   Substance and Sexual Activity  Alcohol Use Yes  . Alcohol/week: 0.0 standard drinks   Comment: occ      Allergies  Allergen Reactions  . Adhesive [Tape] Rash    Current Outpatient Medications  Medication Sig Dispense Refill  . acetaminophen (TYLENOL) 500 MG tablet Take 1,000 mg by mouth every 8 (eight) hours as needed for moderate pain.     Marland Kitchen albuterol (PROVENTIL HFA;VENTOLIN HFA) 108 (90 Base) MCG/ACT inhaler Inhale 2 puffs into the  lungs every 6 (six) hours as needed for wheezing or shortness of breath. 1 Inhaler 2  . albuterol (VENTOLIN HFA) 108 (90 Base) MCG/ACT inhaler INHALE 2 PUFFS BY MOUTH EVERY 6 HOURS AS NEEDED FOR WHEEZING FOR SHORTNESS OF BREATH 18 g 0  . ALPRAZolam (XANAX) 0.5 MG tablet TAKE ONE-HALF TO ONE TABLET BY MOUTH ONCE DAILY AS NEEDED 30 tablet 5  . amLODipine (NORVASC) 5 MG tablet Take 1 tablet by mouth once daily (Patient taking differently: Take 5 mg by mouth daily. ) 90 tablet 0  . aspirin EC 81 MG tablet Take 1 tablet (81 mg total) by mouth daily. 90 tablet 3  . atorvastatin (LIPITOR) 40 MG tablet Take 1 tablet (40 mg total) by mouth daily. 90 tablet 3  . blood glucose meter kit and supplies Test glucose once daily. ICD 10 E11.9 1 each 5  . dexlansoprazole (DEXILANT) 60 MG capsule Take 60 mg by mouth daily.    . enalapril (VASOTEC) 20 MG tablet Take 1 tablet (20 mg total) by mouth 2 (two) times daily. 180 tablet 1  . fenofibrate 160 MG tablet Take 1 tablet by mouth once daily 90 tablet 0  . glyBURIDE-metformin (GLUCOVANCE) 5-500 MG tablet Take 2 tablets by mouth 2 (two) times daily. 360 tablet 0  . metoprolol succinate (TOPROL-XL) 50 MG 24 hr tablet Take 1 tablet (50 mg total) by mouth daily. Take with or immediately following a meal. 90 tablet 1  . insulin detemir (LEVEMIR) 100 UNIT/ML injection Inject 70 Units into the skin 2 (two) times daily. Holding     No current facility-administered medications for this visit.    Review of Systems  Constitutional: Positive for weight loss. Negative for malaise/fatigue.  Respiratory: Negative.   Cardiovascular: Positive for chest pain.  Gastrointestinal: Positive for abdominal pain and heartburn.  Musculoskeletal: Negative.   Neurological: Negative.     PHYSICAL EXAMINATION: BP 110/66   Pulse 70   Temp 97.8 F (36.6 C) (Skin)   Resp 20   Ht '6\' 2"'  (1.88 m)   Wt 285 lb (129.3 kg)   SpO2 97% Comment: RA  BMI 36.59 kg/m   Physical Exam   Constitutional: He is oriented to person, place, and time and well-developed, well-nourished, and in no distress. No distress.  Obese  HENT:  Head: Normocephalic and atraumatic.  Eyes: Conjunctivae are normal. No scleral icterus.  Neck: No tracheal deviation present.  Cardiovascular: Normal rate.  No murmur heard. Pulmonary/Chest: Effort normal. No respiratory distress.  Abdominal: Soft. He exhibits no distension.  Musculoskeletal:  General: Normal range of motion.     Cervical back: Normal range of motion.  Lymphadenopathy:    He has no cervical adenopathy.  Neurological: He is alert and oriented to person, place, and time.  Skin: Skin is warm and dry. He is not diaphoretic.     Diagnostic Studies & Laboratory data:     Recent Radiology Findings:   CT CHEST W CONTRAST  Result Date: 06/21/2019 CLINICAL DATA:  Dysphagia, esophageal mass identified by endoscopy EXAM: CT CHEST, ABDOMEN, AND PELVIS WITH CONTRAST TECHNIQUE: Multidetector CT imaging of the chest, abdomen and pelvis was performed following the standard protocol during bolus administration of intravenous contrast. CONTRAST:  171m OMNIPAQUE IOHEXOL 300 MG/ML SOLN, additional oral enteric contrast COMPARISON:  04/04/2013 FINDINGS: CT CHEST FINDINGS Cardiovascular: No significant vascular findings. Normal heart size. Extensive 3 vessel coronary artery calcifications and/or stents. No pericardial effusion. Mediastinum/Nodes: No enlarged mediastinal, hilar, or axillary lymph nodes. There is a circumferential endoluminal mass of the lower esophagus and gastroesophageal junction measuring approximately 3.2 x 3.1 x 5.5 cm (series 2, image 47). There is minimal adjacent fat stranding (series 2, image 50). Thyroid gland and trachea demonstrate no significant findings. Lungs/Pleura: Lungs are clear. No pleural effusion or pneumothorax. Musculoskeletal: No chest wall mass or suspicious bone lesions identified. CT ABDOMEN PELVIS FINDINGS  Hepatobiliary: No solid liver abnormality is seen. Hepatic steatosis. No gallstones, gallbladder wall thickening, or biliary dilatation. Pancreas: Unremarkable. No pancreatic ductal dilatation or surrounding inflammatory changes. Spleen: Normal in size without significant abnormality. Adrenals/Urinary Tract: Adrenal glands are unremarkable. Small nonobstructive bilateral renal calculi. No hydronephrosis. Bladder is unremarkable. Stomach/Bowel: Stomach is within normal limits. Appendix appears normal. No evidence of bowel wall thickening, distention, or inflammatory changes. Vascular/Lymphatic: Minimal aortic atherosclerosis. There are subtle, prominent gastrohepatic ligament nodes, which are enlarged compared to prior examination and measure up to 1.6 x 0.7 cm (series 2, image 55). Reproductive: No mass or other abnormality. Other: Small, fat containing umbilical hernia. No abdominopelvic ascites. Musculoskeletal: No acute or significant osseous findings. IMPRESSION: 1. Circumferential endoluminal mass of the lower esophagus and gastroesophageal junction, with minimal adjacent fat stranding, in keeping with mass identified by endoscopy. 2. Subtle, prominent gastrohepatic ligament lymph nodes, which measure up to 1.6 x 0.7 cm. These are suspicious for nodal metastatic involvement given enlargement compared to prior examination. 3. No other evidence of metastatic disease in the chest, abdomen, or pelvis. 4. Hepatic steatosis. 5. Nonobstructing nephrolithiasis. 6. Coronary artery disease.  Aortic Atherosclerosis (ICD10-I70.0). Electronically Signed   By: AEddie CandleM.D.   On: 06/21/2019 13:01   CT Abdomen Pelvis W Contrast  Result Date: 06/21/2019 CLINICAL DATA:  Dysphagia, esophageal mass identified by endoscopy EXAM: CT CHEST, ABDOMEN, AND PELVIS WITH CONTRAST TECHNIQUE: Multidetector CT imaging of the chest, abdomen and pelvis was performed following the standard protocol during bolus administration of  intravenous contrast. CONTRAST:  101mOMNIPAQUE IOHEXOL 300 MG/ML SOLN, additional oral enteric contrast COMPARISON:  04/04/2013 FINDINGS: CT CHEST FINDINGS Cardiovascular: No significant vascular findings. Normal heart size. Extensive 3 vessel coronary artery calcifications and/or stents. No pericardial effusion. Mediastinum/Nodes: No enlarged mediastinal, hilar, or axillary lymph nodes. There is a circumferential endoluminal mass of the lower esophagus and gastroesophageal junction measuring approximately 3.2 x 3.1 x 5.5 cm (series 2, image 47). There is minimal adjacent fat stranding (series 2, image 50). Thyroid gland and trachea demonstrate no significant findings. Lungs/Pleura: Lungs are clear. No pleural effusion or pneumothorax. Musculoskeletal: No chest wall mass or suspicious bone lesions  identified. CT ABDOMEN PELVIS FINDINGS Hepatobiliary: No solid liver abnormality is seen. Hepatic steatosis. No gallstones, gallbladder wall thickening, or biliary dilatation. Pancreas: Unremarkable. No pancreatic ductal dilatation or surrounding inflammatory changes. Spleen: Normal in size without significant abnormality. Adrenals/Urinary Tract: Adrenal glands are unremarkable. Small nonobstructive bilateral renal calculi. No hydronephrosis. Bladder is unremarkable. Stomach/Bowel: Stomach is within normal limits. Appendix appears normal. No evidence of bowel wall thickening, distention, or inflammatory changes. Vascular/Lymphatic: Minimal aortic atherosclerosis. There are subtle, prominent gastrohepatic ligament nodes, which are enlarged compared to prior examination and measure up to 1.6 x 0.7 cm (series 2, image 55). Reproductive: No mass or other abnormality. Other: Small, fat containing umbilical hernia. No abdominopelvic ascites. Musculoskeletal: No acute or significant osseous findings. IMPRESSION: 1. Circumferential endoluminal mass of the lower esophagus and gastroesophageal junction, with minimal adjacent fat  stranding, in keeping with mass identified by endoscopy. 2. Subtle, prominent gastrohepatic ligament lymph nodes, which measure up to 1.6 x 0.7 cm. These are suspicious for nodal metastatic involvement given enlargement compared to prior examination. 3. No other evidence of metastatic disease in the chest, abdomen, or pelvis. 4. Hepatic steatosis. 5. Nonobstructing nephrolithiasis. 6. Coronary artery disease.  Aortic Atherosclerosis (ICD10-I70.0). Electronically Signed   By: Eddie Candle M.D.   On: 06/21/2019 13:01       I have independently reviewed the above radiology studies  and reviewed the findings with the patient.   Recent Lab Findings: Lab Results  Component Value Date   WBC 6.9 08/22/2018   HGB 11.6 (L) 08/22/2018   HCT 36.3 (L) 08/22/2018   PLT 225 08/22/2018   GLUCOSE 178 (H) 06/15/2019   CHOL 121 01/08/2019   TRIG 123 01/08/2019   HDL 30 (L) 01/08/2019   LDLCALC 69 01/08/2019   ALT 39 01/08/2019   AST 20 01/08/2019   NA 133 (L) 06/15/2019   K 4.3 06/15/2019   CL 101 06/15/2019   CREATININE 0.70 06/15/2019   BUN 9 06/15/2019   CO2 26 06/15/2019   INR 0.9 08/21/2018   HGBA1C 8.9 (H) 01/08/2019        Problem List: - Adenocarcinoma of the GE junction.  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  He does not lift head endoscopic ultrasound, working PET scan for staging.  There does appear to be some nodal disease gastrohepatic ligament.  He has already been scheduled for neoadjuvant therapy with Dr. Delton Coombes.  Due to his dysphagia he has been placed on a liquid diet and has already lost 35 pounds.  Today his weight was  285 pounds and for a successful surgery he cannot drop below 256 pounds during his treatment.  He will undergo nutritional consult to help ensure that we are able to maintain his weight.  He is scheduled to undergo a PET CT on the 26th of this month and is imperative that he have this for appropriate staging.  I will follow-up with him with a biweekly basis during his treatment course to ensure that he stays on track to meet our surgical goals.      Lajuana Matte 07/09/2019 3:02 PM

## 2019-07-12 ENCOUNTER — Encounter (HOSPITAL_COMMUNITY): Payer: BC Managed Care – PPO

## 2019-07-13 ENCOUNTER — Ambulatory Visit (HOSPITAL_COMMUNITY): Payer: BC Managed Care – PPO | Admitting: Hematology

## 2019-07-14 ENCOUNTER — Other Ambulatory Visit: Payer: Self-pay | Admitting: Family Medicine

## 2019-07-14 ENCOUNTER — Other Ambulatory Visit: Payer: Self-pay | Admitting: Cardiovascular Disease

## 2019-07-16 ENCOUNTER — Encounter (HOSPITAL_COMMUNITY): Payer: Self-pay | Admitting: *Deleted

## 2019-07-16 ENCOUNTER — Telehealth (HOSPITAL_COMMUNITY): Payer: Self-pay

## 2019-07-16 ENCOUNTER — Encounter (HOSPITAL_COMMUNITY): Payer: BC Managed Care – PPO

## 2019-07-16 NOTE — Telephone Encounter (Signed)
Nutrition Assessment   Reason for Assessment:   Referral from Dr. Raliegh Ip.    ASSESSMENT:  59 year old male with new diagnosis of esophageal cancer.  Past medical history of CHF, asthma, depression, morbid obesity, DM, MI, CAD, DVT.  Patient pending PET scan.    Patient never returned RD's call left on Friday, March 19.    RD called patient again this am.  Patient reports that he has mostly been eating pudding, applesauce, broth, pinto bean juice, mixing egg in broth, protein shakes (shamrock, premier protein, fairlife shakes).  Reports that he has had issues with constipation.  Taking miralax and took enema.  Getting tired of drinking liquids.  Reports blood glucose has been doing well.    Frustrated with time it is taking to get PET scan done.  Has spoken with insurance and others at [facility] about frustration.     Medications: dexilant, glyburide-metformin, levemir   Labs: reviewed   Anthropometrics:   Height: 74 inches Weight: 285 lb on 3/19 UBW: 306 lb BMI:  7% weight loss in the last few months Estimated Energy Needs  Kcals: 2800-3225 Protein: 140-161 g Fluid: > 2.8 L   NUTRITION DIAGNOSIS: Inadequate oral intake related to esophageal cancer as evidenced by 7% weight loss in the last few months and on liquid diet   INTERVENTION:  Encouraged patient to try high calorie shakes (350 calories).  Will leave samples at registration.   Discussed daily protein goals.   Phone disconnected and unable to provide further suggestions.  RD called patient back times 3 and left 2 messages with call back number but was not able to reach patient.     MONITORING, EVALUATION, GOAL: Patient will consume adequate calories and protein to prevent weight loss    Next Visit: to be determined  Laurent Cargile B. Zenia Resides, [place], [place] Registered Dietitian [phone] (pager)

## 2019-07-16 NOTE — Progress Notes (Unsigned)
Just got notification from our Prior Auth Specialist that the PET scan got approved late this evening. Prior Auth # [id number]

## 2019-07-16 NOTE — Telephone Encounter (Addendum)
Nutrition  Patient returned RD's call. States that he lost connection on his mobile phone.    Reports that blood glucose has been running 68-70s in the am and 100s in the pm.  Has not been taking insulin but other oral medications.    Has also tried whey protein powders.    Intervention: Patient will pick up samples of higher calorie shakes this afternoon.   Discussed ways to utilize protein powders and provided patient with daily protein goal.   Discussed importance of grinding, pureeing foods using blender, nutribullet.  Patient reports that he has a magic bullet.   Discussed ways to add calories and protein.  Encourage patient to call MD who prescribe DM medication and touch base regarding blood glucose levels and not taking insulin.   Patient has contact information.  Next visit: phone f/u April 16  Steven Crane, Steven Crane, Steven Crane Registered Dietitian [phone] (pager)

## 2019-07-19 ENCOUNTER — Ambulatory Visit (HOSPITAL_COMMUNITY): Payer: BC Managed Care – PPO | Admitting: Hematology

## 2019-07-20 ENCOUNTER — Telehealth: Payer: Self-pay | Admitting: Family Medicine

## 2019-07-20 ENCOUNTER — Encounter
Admission: RE | Admit: 2019-07-20 | Discharge: 2019-07-20 | Disposition: A | Discharge status: Home / Self Care | Payer: BC Managed Care – PPO | Source: Ambulatory Visit | Attending: Hematology | Admitting: Hematology

## 2019-07-20 ENCOUNTER — Other Ambulatory Visit: Payer: Self-pay

## 2019-07-20 DIAGNOSIS — E785 Hyperlipidemia, unspecified: Secondary | ICD-10-CM

## 2019-07-20 DIAGNOSIS — I10 Essential (primary) hypertension: Secondary | ICD-10-CM

## 2019-07-20 DIAGNOSIS — E119 Type 2 diabetes mellitus without complications: Secondary | ICD-10-CM

## 2019-07-20 DIAGNOSIS — C160 Malignant neoplasm of cardia: Secondary | ICD-10-CM | POA: Diagnosis present

## 2019-07-20 DIAGNOSIS — Z79899 Other long term (current) drug therapy: Secondary | ICD-10-CM

## 2019-07-20 DIAGNOSIS — Z125 Encounter for screening for malignant neoplasm of prostate: Secondary | ICD-10-CM

## 2019-07-20 LAB — GLUCOSE, CAPILLARY: Glucose-Capillary: 118 mg/dL — ABNORMAL HIGH (ref 70–99)

## 2019-07-20 MED ORDER — FLUDEOXYGLUCOSE F - 18 (FDG) INJECTION
15.5700 | Freq: Once | INTRAVENOUS | Status: AC | PRN
  Administered 2019-07-20: 15.57 via INTRAVENOUS

## 2019-07-20 MED ORDER — GLYBURIDE-METFORMIN 5-500 MG PO TABS: 2.0000 | ORAL_TABLET | Freq: Two times a day (BID) | ORAL | 0 refills | Status: DC

## 2019-07-20 NOTE — Telephone Encounter (Signed)
Pt has office visit on 4/6 to review his medications and insulin. He would like to know if Dr. Richardson Landry wants him to have lab work done. He starts chemo on Monday.

## 2019-07-20 NOTE — Telephone Encounter (Signed)
Blood work ordered in Epic. Left message to return call to notify patient. 

## 2019-07-20 NOTE — Addendum Note (Signed)
Addended by: Vicente Males on: 07/20/2019 01:52 PM   Modules accepted: Orders

## 2019-07-20 NOTE — Telephone Encounter (Signed)
Same plus psa met7 and urine protien

## 2019-07-20 NOTE — Telephone Encounter (Signed)
Pt returned call and verbalized understanding  

## 2019-07-20 NOTE — Telephone Encounter (Signed)
Last labs completed on 01/08/2019 were A1C, Hepatic and Lipid. Please advise. Thank you

## 2019-07-21 ENCOUNTER — Inpatient Hospital Stay (HOSPITAL_COMMUNITY): Payer: BC Managed Care – PPO | Admitting: Hematology

## 2019-07-21 ENCOUNTER — Encounter (HOSPITAL_COMMUNITY): Payer: Self-pay | Admitting: Hematology

## 2019-07-21 VITALS — BP 137/68 | HR 79 | Temp 96.9°F | Resp 18 | Wt 281.8 lb

## 2019-07-21 DIAGNOSIS — C160 Malignant neoplasm of cardia: Secondary | ICD-10-CM

## 2019-07-21 MED ORDER — PROCHLORPERAZINE MALEATE 10 MG PO TABS: 10.0000 mg | ORAL_TABLET | Freq: Four times a day (QID) | ORAL | 2 refills | Status: DC | PRN

## 2019-07-21 MED ORDER — LIDOCAINE-PRILOCAINE 2.5-2.5 % EX CREA: TOPICAL_CREAM | CUTANEOUS | 3 refills | Status: DC

## 2019-07-21 NOTE — Patient Instructions (Signed)
[place] at [facility] Discharge Instructions  You were seen today by Dr. Delton Coombes. He went over your recent PET results, it showed no spread of your cancer. You will get weekly Carbo/Taxol once a week. He will see you back in 1 week for labs, treatment and follow up.   Thank you for choosing [place] at [facility] to provide your oncology and hematology care.  To afford each patient quality time with our provider, please arrive at least 15 minutes before your scheduled appointment time.   If you have a lab appointment with the [facility] please come in thru the  Main Entrance and check in at the main information desk  You need to re-schedule your appointment should you arrive 10 or more minutes late.  We strive to give you quality time with our providers, and arriving late affects you and other patients whose appointments are after yours.  Also, if you no show three or more times for appointments you may be dismissed from the clinic at the providers discretion.     Again, thank you for choosing [facility].  Our hope is that these requests will decrease the amount of time that you wait before being seen by our physicians.       _____________________________________________________________  Should you have questions after your visit to [facility], please contact our office at [phone] between the hours of 8:00 a.m. and 4:30 p.m.  Voicemails left after 4:00 p.m. will not be returned until the following business day.  For prescription refill requests, have your pharmacy contact our office and allow 72 hours.    Cancer Center Support Programs:   > Cancer Support Group  2nd Tuesday of the month 1pm-2pm, Journey Room

## 2019-07-21 NOTE — Assessment & Plan Note (Signed)
1.  GE junction adenocarcinoma: -Patient with GERD symptoms for many years. -Recent onset dysphagia for the past 3 months with food getting stuck behind the sternum.  Weight loss of 30 pounds in the last 3 months. -EGD on 05/28/2019 by Dr. [name] showed mass in the distal esophagus extending from 33 cm down to 38 cm (GE junction) with an additional 3 cm component involving the proximal cardia of the stomach. -CT CAP from 06/21/2019 showed circumferential endoluminal mass of the lower esophagus and GE junction.  Prominent gastrohepatic lymph nodes, measuring 1.6 x 0.7 cm.  No other evidence of metastatic disease.  Hepatic steatosis seen. -We reviewed the PET scan from 07/20/2019.  Gastrohepatic ligament lymph nodes are not hypermetabolic.  No metastatic disease was seen. -He already met with Dr.Yanagihara and will have simulation scans tomorrow. -We will reach out to Dr. Kipp Brood to see if he needs T staging with EUS. -We talked about chemoradiation therapy with carboplatin and paclitaxel weekly.  We talked about side effects in detail.  2.  Weight loss/dysphagia: -He lost 37 pounds in the last 3 months. -This includes 7 pound loss in the last 3 weeks.  He has dysphagia to solid foods. -He is drinking 2 protein shakes per day.  He tried to drink 4-5 protein shakes per day which caused constipation. -He is eating soft foods like eggs and corn bread, gravy, grits and mashed potatoes. -He was instructed to closely follow-up with our dietitian Joli.  3.  Family history: -Patient has extensive family history with one maternal aunt and 2 maternal uncles who died of cancer.  One paternal aunt died of lung cancer.  Multiple maternal cousins died of cancers.  Patient does not know exact types. -He will benefit from genetic evaluation and testing.  #4 diabetes: -He will continue Glucovance 2 tablets twice daily and Levemir 70 units twice daily.

## 2019-07-21 NOTE — Progress Notes (Signed)
[address] [place], [place] [address]   CLINIC:  Medical Oncology/Hematology  PCP:  [name], [place] [address] [phone]   REASON FOR VISIT:  Follow-up for GE junction adenocarcinoma.  CURRENT THERAPY: Chemoradiation therapy.    INTERVAL HISTORY:  Mr. Deyoung 59 y.o. male seen for follow-up of PET CT scan results.  He lost about 7 pounds in the last 3 weeks.  He is able to eat soft foods like eggs, grits, gravy, Conrad and mashed potatoes.  He is drinking about 2 cans of protein shakes per day.  When he tried to drink more protein shakes, it resulted in constipation.  Denies any tingling or numbness in extremities.  Appetite and energy levels are 100%.    REVIEW OF SYSTEMS:  Review of Systems  Gastrointestinal: Positive for constipation.  All other systems reviewed and are negative.    PAST MEDICAL/SURGICAL HISTORY:  Past Medical History:  Diagnosis Date  . Anxiety   . Arthritis    "hands, back, knees" (08/27/2013)  . Asthma   . CHF (congestive heart failure) ([place]) 02/2010  . Chronic bronchitis ([place])    "get it q year"  . Chronic lower back pain   . Closed head injury 1975  . Coronary artery disease May 2015   s/p successful PTCA/DES x 1 to distal RCA and PTCA/DES x 1 to OM-20 Aug 2013  . Depression   . DVT (deep venous thrombosis) ([place]) 2008   "LLE"  . GERD (gastroesophageal reflux disease)   . Headache    hs of but no longer has problems   . History of kidney stones   . Hyperlipemia   . Hypertension   . Hypertriglyceridemia   . LV dysfunction    LVEF 40% at time of cardiac cath May 2015  . Morbid obesity ([place])   . Myocardial infarction ([place]) 02/2010  . Psoriasis   . Type II diabetes mellitus ([place])    Past Surgical History:  Procedure Laterality Date  . BIOPSY  06/17/2019   Procedure: BIOPSY;  Surgeon: [name], MD;  Location: [facility];  Service: Endoscopy;;  . COLONOSCOPY   01/06/2012   Dr. [name]: suboptimal prep. normal exam. next colonoscopy in 10 years.   . CORONARY ANGIOPLASTY WITH STENT PLACEMENT  02/2010; 08/27/2013   "1 + 3"   . ESOPHAGOGASTRODUODENOSCOPY (EGD) WITH PROPOFOL N/A 06/17/2019   Procedure: ESOPHAGOGASTRODUODENOSCOPY (EGD) WITH PROPOFOL;  Surgeon: [name], MD;  Location: [facility];  Service: Endoscopy;  Laterality: N/A;  11:15am  . KNEE ARTHROSCOPY Left 1981  . LEFT HEART CATHETERIZATION WITH CORONARY ANGIOGRAM N/A 08/27/2013   Procedure: LEFT HEART CATHETERIZATION WITH CORONARY ANGIOGRAM;  Surgeon: [name], MD;  Location: [facility] CATH LAB;  Service: Cardiovascular;  Laterality: N/A;  . right wrist fracture surgery     . TIBIA IM NAIL INSERTION Left 08/21/2018   Procedure: INTRAMEDULLARY (IM) NAIL TIBIAL;  Surgeon: Rod Can, MD;  Location: [facility];  Service: Orthopedics;  Laterality: Left;     SOCIAL HISTORY:  Social History   Socioeconomic History  . Marital status: Single    Spouse name: Not on file  . Number of children: 3  . Years of education: Not on file  . Highest education level: Not on file  Occupational History  . Not on file  Tobacco Use  . Smoking status: Never Smoker  . Smokeless tobacco: Current User  Types: Snuff, Chew  . Tobacco comment: "started dipping @ age 36; quit for 5 1/2 yrs at one time; hadn't chewed in awhile"  Substance and Sexual Activity  . Alcohol use: Yes    Alcohol/week: 0.0 standard drinks    Comment: occ   . Drug use: No  . Sexual activity: Yes  Other Topics Concern  . Not on file  Social History Narrative  . Not on file   Social Determinants of Health   Financial Resource Strain: Low Risk   . Difficulty of Paying Living Expenses: Not hard at all  Food Insecurity: No Food Insecurity  . Worried About Charity fundraiser in the Last Year: Never true  . Ran Out of Food in the Last Year: Never true  Transportation Needs: No Transportation Needs  . Lack of  Transportation (Medical): No  . Lack of Transportation (Non-Medical): No  Physical Activity: Insufficiently Active  . Days of Exercise per Week: 1 day  . Minutes of Exercise per Session: 20 min  Stress: No Stress Concern Present  . Feeling of Stress : Not at all  Social Connections: Slightly Isolated  . Frequency of Communication with Friends and Family: Three times a week  . Frequency of Social Gatherings with Friends and Family: Three times a week  . Attends Religious Services: 1 to 4 times per year  . Active Member of Clubs or Organizations: No  . Attends Archivist Meetings: Never  . Marital Status: Living with partner  Intimate Partner Violence: Not At Risk  . Fear of Current or Ex-Partner: No  . Emotionally Abused: No  . Physically Abused: No  . Sexually Abused: No    FAMILY HISTORY:  Family History  Problem Relation Age of Onset  . Coronary artery disease Father   . Diabetes type II Father   . Diabetes Father   . Heart attack Father   . Hypertension Mother     CURRENT MEDICATIONS:  Outpatient Encounter Medications as of 07/21/2019  Medication Sig  . amLODipine (NORVASC) 5 MG tablet Take 1 tablet by mouth once daily  . aspirin EC 81 MG tablet Take 1 tablet (81 mg total) by mouth daily.  Marland Kitchen atorvastatin (LIPITOR) 40 MG tablet Take 1 tablet by mouth once daily  . blood glucose meter kit and supplies Test glucose once daily. ICD 10 E11.9  . dexlansoprazole (DEXILANT) 60 MG capsule Take 60 mg by mouth daily.  . enalapril (VASOTEC) 20 MG tablet Take 1 tablet (20 mg total) by mouth 2 (two) times daily.  . fenofibrate 160 MG tablet Take 1 tablet by mouth once daily  . glyBURIDE-metformin (GLUCOVANCE) 5-500 MG tablet Take 2 tablets by mouth 2 (two) times daily.  . metoprolol succinate (TOPROL-XL) 50 MG 24 hr tablet TAKE 1 TABLET BY MOUTH ONCE DAILY WITH  OR  IMMEDIATELY  FOLLOWING  A  MEAL  . acetaminophen (TYLENOL) 500 MG tablet Take 1,000 mg by mouth every 8 (eight)  hours as needed for moderate pain.   Marland Kitchen albuterol (PROVENTIL HFA;VENTOLIN HFA) 108 (90 Base) MCG/ACT inhaler Inhale 2 puffs into the lungs every 6 (six) hours as needed for wheezing or shortness of breath. (Patient not taking: Reported on 07/21/2019)  . albuterol (VENTOLIN HFA) 108 (90 Base) MCG/ACT inhaler INHALE 2 PUFFS BY MOUTH EVERY 6 HOURS AS NEEDED FOR WHEEZING FOR SHORTNESS OF BREATH (Patient not taking: Reported on 07/21/2019)  . ALPRAZolam (XANAX) 0.5 MG tablet TAKE ONE-HALF TO ONE TABLET BY MOUTH ONCE  DAILY AS NEEDED (Patient not taking: Reported on 07/21/2019)  . insulin detemir (LEVEMIR) 100 UNIT/ML injection Inject 70 Units into the skin 2 (two) times daily. Holding   No facility-administered encounter medications on file as of 07/21/2019.    ALLERGIES:  Allergies  Allergen Reactions  . Adhesive [Tape] Rash     PHYSICAL EXAM:  ECOG Performance status: 1  Vitals:   07/21/19 1004  BP: 137/68  Pulse: 79  Resp: 18  Temp: (!) 96.9 F (36.1 C)  SpO2: 98%   Filed Weights   07/21/19 1004  Weight: 281 lb 12.8 oz (127.8 kg)    Physical Exam Vitals reviewed.  Constitutional:      Appearance: Normal appearance.  Cardiovascular:     Rate and Rhythm: Normal rate and regular rhythm.     Heart sounds: Normal heart sounds.  Pulmonary:     Effort: Pulmonary effort is normal.     Breath sounds: Normal breath sounds.  Abdominal:     General: There is no distension.     Palpations: Abdomen is soft. There is no mass.  Skin:    General: Skin is warm.  Neurological:     General: No focal deficit present.     Mental Status: He is alert and oriented to person, place, and time.  Psychiatric:        Mood and Affect: Mood normal.        Behavior: Behavior normal.      LABORATORY DATA:  I have reviewed the labs as listed.  CBC    Component Value Date/Time   WBC 6.9 08/22/2018 0337   RBC 4.14 (L) 08/22/2018 0337   HGB 11.6 (L) 08/22/2018 0337   HCT 36.3 (L) 08/22/2018 0337     PLT 225 08/22/2018 0337   MCV 87.7 08/22/2018 0337   MCH 28.0 08/22/2018 0337   MCHC 32.0 08/22/2018 0337   RDW 13.2 08/22/2018 0337   LYMPHSABS 1.5 08/06/2013 1730   MONOABS 0.6 08/06/2013 1730   EOSABS 0.1 08/06/2013 1730   BASOSABS 0.0 08/06/2013 1730   CMP Latest Ref Rng & Units 06/15/2019 01/08/2019 08/22/2018  Glucose 70 - 99 mg/dL 178(H) - 110(H)  BUN 6 - 20 mg/dL 9 - 12  Creatinine 0.61 - 1.24 mg/dL 0.70 - 0.74  Sodium 135 - 145 mmol/L 133(L) - 137  Potassium 3.5 - 5.1 mmol/L 4.3 - 4.2  Chloride 98 - 111 mmol/L 101 - 103  CO2 22 - 32 mmol/L 26 - 26  Calcium 8.9 - 10.3 mg/dL 8.6(L) - 8.3(L)  Total Protein 6.0 - 8.5 g/dL - 6.5 -  Total Bilirubin 0.0 - 1.2 mg/dL - 0.3 -  Alkaline Phos 39 - 117 IU/L - 56 -  AST 0 - 40 IU/L - 20 -  ALT 0 - 44 IU/L - 39 -       DIAGNOSTIC IMAGING:  I have independently reviewed the scans and discussed with the patient.     ASSESSMENT & PLAN:   Primary adenocarcinoma of gastroesophageal junction (HCC) 1.  GE junction adenocarcinoma: -Patient with GERD symptoms for many years. -Recent onset dysphagia for the past 3 months with food getting stuck behind the sternum.  Weight loss of 30 pounds in the last 3 months. -EGD on 05/28/2019 by Dr. [name] showed mass in the distal esophagus extending from 33 cm down to 38 cm (GE junction) with an additional 3 cm component involving the proximal cardia of the stomach. -CT CAP from 06/21/2019 showed circumferential endoluminal  mass of the lower esophagus and GE junction.  Prominent gastrohepatic lymph nodes, measuring 1.6 x 0.7 cm.  No other evidence of metastatic disease.  Hepatic steatosis seen. -We reviewed the PET scan from 07/20/2019.  Gastrohepatic ligament lymph nodes are not hypermetabolic.  No metastatic disease was seen. -He already met with Dr.Yanagihara and will have simulation scans tomorrow. -We will reach out to Dr. Kipp Brood to see if he needs T staging with EUS. -We talked about  chemoradiation therapy with carboplatin and paclitaxel weekly.  We talked about side effects in detail.  2.  Weight loss/dysphagia: -He lost 37 pounds in the last 3 months. -This includes 7 pound loss in the last 3 weeks.  He has dysphagia to solid foods. -He is drinking 2 protein shakes per day.  He tried to drink 4-5 protein shakes per day which caused constipation. -He is eating soft foods like eggs and corn bread, gravy, grits and mashed potatoes. -He was instructed to closely follow-up with our dietitian Joli.  3.  Family history: -Patient has extensive family history with one maternal aunt and 2 maternal uncles who died of cancer.  One paternal aunt died of lung cancer.  Multiple maternal cousins died of cancers.  Patient does not know exact types. -He will benefit from genetic evaluation and testing.  #4 diabetes: -He will continue Glucovance 2 tablets twice daily and Levemir 70 units twice daily.      Orders placed this encounter:  Orders Placed This Encounter  Procedures  . CBC with Differential/Platelet  . Comprehensive metabolic panel  . Magnesium   Total time spent is 40 minutes with more than 50% of the time spent face-to-face discussing and reviewing scan, treatment plan, counseling and coordination of care.   Derek Jack, MD [place] [phone]

## 2019-07-21 NOTE — Patient Instructions (Addendum)
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  You will be treated weekly for 6 weeks in the clinic (in conjunction with radiation therapy) with two chemotherapy drugs - paclitaxel (Taxol) and carboplatin.  The intent of treatment is to cure your disease.  You will see the doctor regularly throughout treatment.  We will obtain blood work from you prior to every treatment and monitor your results to make sure it is safe to give your treatment. The doctor monitors your response to treatment by the way you are feeling, your blood work, and by obtaining scans periodically. There will be wait times while you are here for treatment.  It will take about 30 minutes to 1 hour for your lab work to result. Then there will be wait times while pharmacy mixes your medications.    Medications you will receive in the clinic prior to your chemotherapy medications:  Aloxi:  ALOXI is used in adults to help prevent the nausea and vomiting that happens with certain chemotherapy drugs.  Aloxi is a long acting medication, and will remain in your system for about 2 days.   Emend:  This is an anti-nausea medication that is used with Aloxi to help prevent nausea and vomiting caused by chemotherapy.  Dexamethasone:  This is a steroid given prior to chemotherapy to help prevent allergic reactions; it may also help prevent and control nausea and diarrhea.   Pepcid:  This medication is a histamine blocker that helps prevent and allergic reaction to your chemotherapy.   Benadryl:  This is a histamine blocker (different from the Pepcid) that helps prevent allergic/infusion reactions to your chemotherapy. This medication may cause dizziness/drowsiness.    Paclitaxel (Taxol)  About This Drug  Paclitaxel is a drug used to treat cancer. It is given in the vein (IV).  This will take 1 hour to infuse.  This first infusion will take longer because it is  increased slowly to monitor for reactions.  The nurse will be in the room with you for the first 15 minutes of the first infusion.  Possible Side Effects  . Hair loss. Hair loss is often temporary, although with certain medicine, hair loss can sometimes be permanent. Hair loss may happen suddenly or gradually. If you lose hair, you may lose it from your head, face, armpits, pubic area, chest, and/or legs. You may also notice your hair getting thin.  . Swelling of your legs, ankles and/or feet (edema)  . Flushing  . Nausea and throwing up (vomiting)  . Loose bowel movements (diarrhea)  . Bone marrow depression. This is a decrease in the number of white blood cells, red blood cells, and platelets. This may raise your risk of infection, make you tired and weak (fatigue), and raise your risk of bleeding.  . Effects on the nerves are called peripheral neuropathy. You may feel numbness, tingling, or pain in your hands and feet. It may be hard for you to button your clothes, open jars, or walk as usual. The effect on the nerves may get worse with more doses of the drug. These effects get better in some people after the drug is stopped but it does not get better in all people.  . Changes in your liver function  . Bone, joint and muscle pain  . Abnormal EKG  . Allergic reaction: Allergic reactions, including anaphylaxis are rare but may happen in some patients. Signs of allergic reaction to this drug  may be swelling of the face, feeling like your tongue or throat are swelling, trouble breathing, rash, itching, fever, chills, feeling dizzy, and/or feeling that your heart is beating in a fast or not normal way. If this happens, do not take another dose of this drug. You should get urgent medical treatment.  . Infection  . Changes in your kidney function.  Note: Each of the side effects above was reported in 20% or greater of patients treated with paclitaxel. Not all possible side effects are  included above.   Warnings and Precautions  . Severe allergic reactions  . Severe bone marrow depression   Treating Side Effects  . To help with hair loss, wash with a mild shampoo and avoid washing your hair every day.  . Avoid rubbing your scalp, instead, pat your hair or scalp dry  . Avoid coloring your hair  . Limit your use of hair spray, electric curlers, blow dryers, and curling irons.  . If you are interested in getting a wig, talk to your nurse. You can also call the [facility] at [phone]-2345 to find out information about the "Look Good, Feel Better" program close to where you live. It is a free program where women getting chemotherapy can learn about wigs, turbans and scarves as well as makeup techniques and skin and nail care.  . Ask your doctor or nurse about medicines that are available to help stop or lessen diarrhea and/or nausea.  . To help with nausea and vomiting, eat small, frequent meals instead of three large meals a day. Choose foods and drinks that are at room temperature. Ask your nurse or doctor about other helpful tips and medicine that is available to help or stop lessen these symptoms.  . If you get diarrhea, eat low-fiber foods that are high in protein and calories and avoid foods that can irritate your digestive tracts or lead to cramping. Ask your nurse or doctor about medicine that can lessen or stop your diarrhea.  . Mouth care is very important. Your mouth care should consist of routine, gentle cleaning of your teeth or dentures and rinsing your mouth with a mixture of 1/2 teaspoon of salt in 8 ounces of water or  teaspoon of baking soda in 8 ounces of water. This should be done at least after each meal and at bedtime.  . If you have mouth sores, avoid mouthwash that has alcohol. Also avoid alcohol and smoking because they can bother your mouth and throat.  . Drink plenty of fluids (a minimum of eight glasses per day is  recommended).  . Take your temperature as your doctor or nurse tells you, and whenever you feel like you may have a fever.  . Talk to your doctor or nurse about precautions you can take to avoid infections and bleeding.  . Be careful when cooking, walking, and handling sharp objects and hot liquids.  Food and Drug Interactions  . There are no known interactions of paclitaxel with food.  . This drug may interact with other medicines. Tell your doctor and pharmacist about all the medicines and dietary supplements (vitamins, minerals, herbs and others) that you are taking at this time.  . The safety and use of dietary supplements and alternative diets are often not known. Using these might affect your cancer or interfere with your treatment. Until more is known, you should not use dietary supplements or alternative diets without your cancer doctor's help.  When to Call the Doctor  Call your doctor or nurse if you have any of the following symptoms and/or any new or unusual symptoms:  . Fever of 100.4 F (38 C) or above  . Chills  . Redness, pain, warmth, or swelling at the IV site during the infusion  . Signs of allergic reaction: swelling of the face, feeling like your tongue or throat are swelling, trouble breathing, rash, itching, fever, chills, feeling dizzy, and/or feeling that your heart is beating in a fast or not normal way  . Feeling that your heart is beating in a fast or not normal way (palpitations)  . Weight gain of 5 pounds in one week (fluid retention)  . Decreased urine or very dark urine  . Signs of liver problems: dark urine, pale bowel movements, bad stomach pain, feeling very tired and weak, unusual  itching, or yellowing of the eyes or skin  . Heavy menstrual period that lasts longer than normal  . Easy bruising or bleeding  . Nausea that stops you from eating or drinking, and/or that is not relieved by prescribed medicines.  . Loose bowel movements  (diarrhea) more than 4 times a day or diarrhea with weakness or lightheadedness  . Pain in your mouth or throat that makes it hard to eat or drink  . Lasting loss of appetite or rapid weight loss of five pounds in a week  . Signs of peripheral neuropathy: numbness, tingling, or decreased feeling in fingers or toes; trouble walking or changes in the way you walk; or feeling clumsy when buttoning clothes, opening jars, or other routine activities  . Joint and muscle pain that is not relieved by prescribed medicines  . Extreme fatigue that interferes with normal activities  . While you are getting this drug, please tell your nurse right away if you have any pain, redness, or swelling at the site of the IV infusion.  . If you think you are pregnant.  Reproduction Warnings  . Pregnancy warning: This drug may have harmful effects on the unborn child, it is recommended that effective methods of birth control should be used during your cancer treatment. Let your doctor know right away if you think you may be pregnant.  . Breast feeding warning: Women should not breast feed during treatment because this drug could enter the breastmilk and cause harm to a breast feeding baby.   Carboplatin (Paraplatin, CBDCA)  About This Drug  Carboplatin is used to treat cancer. It is given in the vein through your port a cath.  It will take 30 minutes to infuse. You will receive this medication every 3 weeks.   Possible Side Effects  . Bone marrow suppression. This is a decrease in the number of white blood cells, red blood cells, and platelets. This may raise your risk of infection, make you tired and weak (fatigue), and raise your risk of bleeding.  . Nausea and vomiting (throwing up)  . Weakness  . Changes in your liver function  . Changes in your kidney function  . Electrolyte changes  . Pain  Note: Each of the side effects above was reported in 20% or greater of patients treated with  carboplatin. Not all possible side effects are included above.   Warnings and Precautions  . Severe bone marrow suppression  . Allergic reactions, including anaphylaxis are rare but may happen in some patients. Signs of allergic reaction to this drug may be swelling of the face, feeling like your tongue or throat are swelling, trouble breathing, rash,  itching, fever, chills, feeling dizzy, and/or feeling that your heart is beating in a fast or not normal way. If this happens, do not take another dose of this drug. You should get urgent medical treatment.  . Severe nausea and vomiting  . Effects on the nerves are called peripheral neuropathy. This risk is increased if you are over the age of 48 or if you have received other medicine with risk of peripheral neuropathy. You may feel numbness, tingling, or pain in your hands and feet. It may be hard for you to button your clothes, open jars, or walk as usual. The effect on the nerves may get worse with more doses of the drug. These effects get better in some people after the drug is stopped but it does not get better in all people.  Marland Kitchen Blurred vision, loss of vision or other changes in eyesight  . Decreased hearing  . Skin and tissue irritation including redness, pain, warmth, or swelling at the IV site if the drug leaks out of the vein and into nearby tissue.  . Severe changes in your kidney function, which can cause kidney failure  . Severe changes in your liver function, which can cause liver failure  Note: Some of the side effects above are very rare. If you have concerns and/or questions, please discuss them with your medical team.  Important Information  . This drug may be present in the saliva, tears, sweat, urine, stool, vomit, semen, and vaginal secretions. Talk to your doctor and/or your nurse about the necessary precautions to take during this time.  Treating Side Effects  . Manage tiredness by pacing your activities for the  day.  . Be sure to include periods of rest between energy-draining activities.  . To decrease the risk of infection, wash your hands regularly.  . Avoid close contact with people who have a cold, the flu, or other infections.  . Take your temperature as your doctor or nurse tells you, and whenever you feel like you may have a fever.  . To help decrease the risk of bleeding, use a soft toothbrush. Check with your nurse before using dental floss.  . Be very careful when using knives or tools.  . Use an electric shaver instead of a razor.  . Drink plenty of fluids (a minimum of eight glasses per day is recommended).  . If you throw up or have loose bowel movements, you should drink more fluids so that you do not become dehydrated (lack of water in the body from losing too much fluid).  . To help with nausea and vomiting, eat small, frequent meals instead of three large meals a day.  Choose foods and drinks that are at room temperature. Ask your nurse or doctor about other helpful tips and medicine that is available to help stop or lessen these symptoms.  . If you have numbness and tingling in your hands and feet, be careful when cooking, walking, and handling sharp objects and hot liquids.  Marland Kitchen Keeping your pain under control is important to your well-being. Please tell your doctor or nurse if you are experiencing pain.  Food and Drug Interactions  . There are no known interactions of carboplatin with food.  . This drug may interact with other medicines. Tell your doctor and pharmacist about all the prescription and over-the-counter medicines and dietary supplements (vitamins, minerals, herbs and others) that you are taking at this time. Also, check with your doctor or pharmacist before starting any new  prescription or over-the-counter medicines, or dietary supplements to make sure that there are no interactions.  When to Call the Doctor  Call your doctor or nurse if you have any of  these symptoms and/or any new or unusual symptoms:  . Fever of 100.4 F (38 C) or higher  . Chills  . Tiredness that interferes with your daily activities  . Feeling dizzy or lightheaded  . Easy bleeding or bruising  . Nausea that stops you from eating or drinking and/or is not relieved by prescribed medicines  . Throwing up/vomiting  . Blurred vision or other changes in eyesight  . Decrease in hearing or ringing in the ear  . Signs of allergic reaction: swelling of the face, feeling like your tongue or throat are swelling, trouble breathing, rash, itching, fever, chills, feeling dizzy, and/or feeling that your heart is beating in a fast or not normal way. If this happens, call 911 for emergency care.  . While you are getting this drug, please tell your nurse right away if you have any pain, redness, or swelling at the site of the IV infusion  . Signs of possible liver problems: dark urine, pale bowel movements, bad stomach pain, feeling very tired and weak, unusual itching, or yellowing of the eyes or skin  . Decreased urine, or very dark urine  . Numbness, tingling, or pain in your hands and feet  . Pain that does not go away or is not relieved by prescribed medicine  . If you think you may be pregnant  Reproduction Warnings  . Pregnancy warning: This drug may have harmful effects on the unborn baby. Women of child bearing potential should use effective methods of birth control during your cancer treatment. Let your doctor know right away if you think you may be pregnant.  . Breastfeeding warning: It is not known if this drug passes into breast milk. For this reason, women should not breastfeed during treatment because this drug could enter the breast milk and cause harm to a breastfeeding baby.  . Fertility warning: Human fertility studies have not been done with this drug. Talk with your doctor or nurse if you plan to have children. Ask for information on sperm or egg  banking.  SELF CARE ACTIVITIES WHILE RECEIVING CHEMOTHERAPY:  Hydration Increase your fluid intake 48 hours prior to treatment and drink at least 8 to 12 cups (64 ounces) of water/decaffeinated beverages per day after treatment. You can still have your cup of coffee or soda but these beverages do not count as part of your 8 to 12 cups that you need to drink daily. No alcohol intake.  Medications Continue taking your normal prescription medication as prescribed.  If you start any new herbal or new supplements please let us know first to make sure it is safe.  Mouth Care Have teeth cleaned professionally before starting treatment. Keep dentures and partial plates clean. Use soft toothbrush and do not use mouthwashes that contain alcohol. Biotene is a good mouthwash that is available at most pharmacies or may be ordered by calling [phone]. Use warm salt water gargles (1 teaspoon salt per 1 quart warm water) before and after meals and at bedtime. If you need dental work, please let the doctor know before you go for your appointment so that we can coordinate the best possible time for you in regards to your chemo regimen. You need to also let your dentist know that you are actively taking chemo. We may need to  do labs prior to your dental appointment.  Skin Care Always use sunscreen that has not expired and with SPF (Sun Protection Factor) of 50 or higher. Wear hats to protect your head from the sun. Remember to use sunscreen on your hands, ears, face, & feet.  Use good moisturizing lotions such as udder cream, eucerin, or even Vaseline. Some chemotherapies can cause dry skin, color changes in your skin and nails.    . Avoid long, hot showers or baths. . Use gentle, fragrance-free soaps and laundry detergent. . Use moisturizers, preferably creams or ointments rather than lotions because the thicker consistency is better at preventing skin dehydration. Apply the cream or ointment within 15 minutes  of showering. Reapply moisturizer at night, and moisturize your hands every time after you wash them.  Hair Loss (if your doctor says your hair will fall out)  . If your doctor says that your hair is likely to fall out, decide before you begin chemo whether you want to wear a wig. You may want to shop before treatment to match your hair color. . Hats, turbans, and scarves can also camouflage hair loss, although some people prefer to leave their heads uncovered. If you go bare-headed outdoors, be sure to use sunscreen on your scalp. . Cut your hair short. It eases the inconvenience of shedding lots of hair, but it also can reduce the emotional impact of watching your hair fall out. . Don't perm or color your hair during chemotherapy. Those chemical treatments are already damaging to hair and can enhance hair loss. Once your chemo treatments are done and your hair has grown back, it's OK to resume dyeing or perming hair.  With chemotherapy, hair loss is almost always temporary. But when it grows back, it may be a different color or texture. In older adults who still had hair color before chemotherapy, the new growth may be completely gray.  Often, new hair is very fine and soft.  Infection Prevention Please wash your hands for at least 30 seconds using warm soapy water. Handwashing is the #1 way to prevent the spread of germs. Stay away from sick people or people who are getting over a cold. If you develop respiratory systems such as green/yellow mucus production or productive cough or persistent cough let us know and we will see if you need an antibiotic. It is a good idea to keep a pair of gloves on when going into grocery stores/[employer] to decrease your risk of coming into contact with germs on the carts, etc. Carry alcohol hand gel with you at all times and use it frequently if out in public. If your temperature reaches 100.5 or higher please call the clinic and let us know.  If it is after hours or on  the weekend please go to the ER if your temperature is over 100.5.  Please have your own personal thermometer at home to use.    Sex and bodily fluids If you are going to have sex, a condom must be used to protect the person that isn't taking chemotherapy. Chemo can decrease your libido (sex drive). For a few days after chemotherapy, chemotherapy can be excreted through your bodily fluids.  When using the toilet please close the lid and flush the toilet twice.  Do this for a few day after you have had chemotherapy.   Effects of chemotherapy on your sex life Some changes are simple and won't last long. They won't affect your sex life permanently.  Sometimes  you may feel: . too tired . not strong enough to be very active . sick or sore  . not in the mood . anxious or low Your anxiety might not seem related to sex. For example, you may be worried about the cancer and how your treatment is going. Or you may be worried about money, or about how you family are coping with your illness.  These things can cause stress, which can affect your interest in sex. It's important to talk to your partner about how you feel.  Remember - the changes to your sex life don't usually last long. There's usually no medical reason to stop having sex during chemo. The drugs won't have any long term physical effects on your performance or enjoyment of sex. Cancer can't be passed on to your partner during sex  Contraception It's important to use reliable contraception during treatment. Avoid getting pregnant while you or your partner are having chemotherapy. This is because the drugs may harm the baby. Sometimes chemotherapy drugs can leave a man or woman infertile.  This means you would not be able to have children in the future. You might want to talk to someone about permanent infertility. It can be very difficult to learn that you may no longer be able to have children. Some people find counselling helpful. There might be  ways to preserve your fertility, although this is easier for men than for women. You may want to speak to a fertility expert. You can talk about sperm banking or harvesting your eggs. You can also ask about other fertility options, such as donor eggs. If you have or have had breast cancer, your doctor might advise you not to take the contraceptive pill. This is because the hormones in it might affect the cancer. It is not known for sure whether or not chemotherapy drugs can be passed on through semen or secretions from the vagina. Because of this some doctors advise people to use a barrier method if you have sex during treatment. This applies to vaginal, anal or oral sex. Generally, doctors advise a barrier method only for the time you are actually having the treatment and for about a week after your treatment. Advice like this can be worrying, but this does not mean that you have to avoid being intimate with your partner. You can still have close contact with your partner and continue to enjoy sex.  Animals If you have cats or birds we just ask that you not change the litter or change the cage.  Please have someone else do this for you while you are on chemotherapy.   Food Safety During and After Cancer Treatment Food safety is important for people both during and after cancer treatment. Cancer and cancer treatments, such as chemotherapy, radiation therapy, and stem cell/bone marrow transplantation, often weaken the immune system. This makes it harder for your body to protect itself from foodborne illness, also called food poisoning. Foodborne illness is caused by eating food that contains harmful bacteria, parasites, or viruses.  Foods to avoid Some foods have a higher risk of becoming tainted with bacteria. These include: Marland Kitchen Unwashed fresh fruit and vegetables, especially leafy vegetables that can hide dirt and other contaminants . Raw sprouts, such as alfalfa sprouts . Raw or undercooked beef,  especially ground beef, or other raw or undercooked meat and poultry . Fatty, fried, or spicy foods immediately before or after treatment.  These can sit heavy on your stomach and make you feel  nauseous. . Raw or undercooked shellfish, such as oysters. . Sushi and sashimi, which often contain raw fish.  . Unpasteurized beverages, such as unpasteurized fruit juices, raw milk, raw yogurt, or cider . Undercooked eggs, such as soft boiled, over easy, and poached; raw, unpasteurized eggs; or foods made with raw egg, such as homemade raw cookie dough and homemade mayonnaise  Simple steps for food safety  Shop smart. . Do not buy food stored or displayed in an unclean area. . Do not buy bruised or damaged fruits or vegetables. . Do not buy cans that have cracks, dents, or bulges. . Pick up foods that can spoil at the end of your shopping trip and store them in a cooler on the way home.  Prepare and clean up foods carefully. . Rinse all fresh fruits and vegetables under running water, and dry them with a clean towel or paper towel. . Clean the top of cans before opening them. . After preparing food, wash your hands for 20 seconds with hot water and soap. Pay special attention to areas between fingers and under nails. . Clean your utensils and dishes with hot water and soap. Marland Kitchen Disinfect your kitchen and cutting boards using 1 teaspoon of liquid, unscented bleach mixed into 1 quart of water.    Dispose of old food. . Eat canned and packaged food before its expiration date (the "use by" or "best before" date). . Consume refrigerated leftovers within 3 to 4 days. After that time, throw out the food. Even if the food does not smell or look spoiled, it still may be unsafe. Some bacteria, such as Listeria, can grow even on foods stored in the refrigerator if they are kept for too long.  Take precautions when eating out. . At restaurants, avoid buffets and salad bars where food sits out for a long time  and comes in contact with many people. Food can become contaminated when someone with a virus, often a norovirus, or another "bug" handles it. . Put any leftover food in a "to-go" container yourself, rather than having the server do it. And, refrigerate leftovers as soon as you get home. . Choose restaurants that are clean and that are willing to prepare your food as you order it cooked.   AT HOME MEDICATIONS:                                                                                                                                                                Compazine/Prochlorperazine 10mg  tablet. Take 1 tablet every 6 hours as needed for nausea/vomiting. (This can make you sleepy)   EMLA cream. Apply a quarter size amount to port site 1 hour prior to chemo. Do not rub in. Cover with plastic wrap.    Diarrhea Sheet  If you are having loose stools/diarrhea, please purchase Imodium and begin taking as outlined:  At the first sign of poorly formed or loose stools you should begin taking Imodium (loperamide) 2 mg capsules.  Take two tablets (4mg ) followed by one tablet (2mg ) every 2 hours - DO NOT EXCEED 8 tablets in 24 hours.  If it is bedtime and you are having loose stools, take 2 tablets at bedtime, then 2 tablets every 4 hours until morning.   Always call the [facility] if you are having loose stools/diarrhea that you can't get under control.  Loose stools/diarrhea leads to dehydration (loss of water) in your body.  We have other options of trying to get the loose stools/diarrhea to stop but you must let us know!   Constipation Sheet  Colace - 100 mg capsules - take 2 capsules daily.  If this doesn't help then you can increase to 2 capsules twice daily.  Please call if the above does not work for you. Do not go more than 2 days without a bowel movement.  It is very important that you do not become constipated.  It will make you feel sick to your stomach (nausea) and can cause  abdominal pain and vomiting.  Nausea Sheet   Compazine/Prochlorperazine 10mg  tablet. Take 1 tablet every 6 hours as needed for nausea/vomiting (This can make you drowsy).  If you are having persistent nausea (nausea that does not stop) please call the [facility] and let us know the amount of nausea that you are experiencing.  If you begin to vomit, you need to call the [facility] and if it is the weekend and you have vomited more than one time and can't get it to stop-go to the Emergency Room.  Persistent nausea/vomiting can lead to dehydration (loss of fluid in your body) and will make you feel very weak and unwell. Ice chips, sips of clear liquids, foods that are at room temperature, crackers, and toast tend to be better tolerated.   SYMPTOMS TO REPORT AS SOON AS POSSIBLE AFTER TREATMENT:  FEVER GREATER THAN 100.5 F  CHILLS WITH OR WITHOUT FEVER  NAUSEA AND VOMITING THAT IS NOT CONTROLLED WITH YOUR NAUSEA MEDICATION  UNUSUAL SHORTNESS OF BREATH  UNUSUAL BRUISING OR BLEEDING  TENDERNESS IN MOUTH AND THROAT WITH OR WITHOUT   PRESENCE OF ULCERS  URINARY PROBLEMS  BOWEL PROBLEMS  UNUSUAL RASH      Wear comfortable clothing and clothing appropriate for easy access to any Portacath or PICC line. Let us know if there is anything that we can do to make your therapy better!    What to do if you need assistance after hours or on the weekends: CALL [phone].  HOLD on the line, do not hang up.  You will hear multiple messages but at the end you will be connected with a nurse triage line.  They will contact the doctor if necessary.  Most of the time they will be able to assist you.  Do not call the hospital operator.      I have been informed and understand all of the instructions given to me and have received a copy. I have been instructed to call the clinic [phone] or my family physician as soon as possible for continued medical care, if indicated. I do not have any  more questions at this time but understand that I may call the [facility] or the Patient Navigator at [phone] during office hours should I have  questions or need assistance in obtaining follow-up care.

## 2019-07-22 ENCOUNTER — Other Ambulatory Visit: Payer: Self-pay

## 2019-07-22 ENCOUNTER — Inpatient Hospital Stay (HOSPITAL_COMMUNITY): Payer: BC Managed Care – PPO | Attending: Hematology

## 2019-07-22 DIAGNOSIS — R634 Abnormal weight loss: Secondary | ICD-10-CM | POA: Insufficient documentation

## 2019-07-22 DIAGNOSIS — C160 Malignant neoplasm of cardia: Secondary | ICD-10-CM

## 2019-07-22 DIAGNOSIS — K760 Fatty (change of) liver, not elsewhere classified: Secondary | ICD-10-CM | POA: Insufficient documentation

## 2019-07-22 DIAGNOSIS — Z5111 Encounter for antineoplastic chemotherapy: Secondary | ICD-10-CM | POA: Insufficient documentation

## 2019-07-22 DIAGNOSIS — R1310 Dysphagia, unspecified: Secondary | ICD-10-CM | POA: Insufficient documentation

## 2019-07-22 DIAGNOSIS — Z79899 Other long term (current) drug therapy: Secondary | ICD-10-CM | POA: Insufficient documentation

## 2019-07-22 NOTE — Progress Notes (Signed)

## 2019-07-22 NOTE — Progress Notes (Signed)
START ON PATHWAY REGIMEN - Gastroesophageal     Administer weekly during RT:     Paclitaxel      Carboplatin   **Always confirm dose/schedule in your pharmacy ordering system**  Patient Characteristics: Esophageal & GE Junction, Adenocarcinoma, Preoperative or Nonsurgical Candidate (Clinical Staging), cT2 or Higher or cN+, Surgical Candidate (Up to cT4a) - Preoperative Therapy, GE Junction Histology: Adenocarcinoma Disease Classification: GE Junction Therapeutic Status: Preoperative or Nonsurgical Candidate (Clinical Staging) AJCC Grade: GX AJCC 8 Stage Grouping: IIB AJCC T Category: cT2 AJCC N Category: cN0 AJCC M Category: cM0 Intent of Therapy: Curative Intent, Discussed with Patient

## 2019-07-23 LAB — BASIC METABOLIC PANEL
BUN/Creatinine Ratio: 16 (ref 9–20)
BUN: 11 mg/dL (ref 6–24)
CO2: 21 mmol/L (ref 20–29)
Calcium: 9.3 mg/dL (ref 8.7–10.2)
Chloride: 105 mmol/L (ref 96–106)
Creatinine, Ser: 0.69 mg/dL — ABNORMAL LOW (ref 0.76–1.27)
GFR calc Af Amer: 121 mL/min/{1.73_m2} (ref >59)
GFR calc non Af Amer: 105 mL/min/{1.73_m2} (ref >59)
Glucose: 134 mg/dL — ABNORMAL HIGH (ref 65–99)
Potassium: 5.2 mmol/L (ref 3.5–5.2)
Sodium: 141 mmol/L (ref 134–144)

## 2019-07-23 LAB — LIPID PANEL
Chol/HDL Ratio: 3.4 ratio (ref 0.0–5.0)
Cholesterol, Total: 117 mg/dL (ref 100–199)
HDL: 34 mg/dL — ABNORMAL LOW (ref >39)
LDL Chol Calc (NIH): 62 mg/dL (ref 0–99)
Triglycerides: 115 mg/dL (ref 0–149)
VLDL Cholesterol Cal: 21 mg/dL (ref 5–40)

## 2019-07-23 LAB — HEPATIC FUNCTION PANEL
ALT: 22 IU/L (ref 0–44)
AST: 14 IU/L (ref 0–40)
Albumin: 4.2 g/dL (ref 3.8–4.9)
Alkaline Phosphatase: 49 IU/L (ref 39–117)
Bilirubin Total: 0.2 mg/dL (ref 0.0–1.2)
Bilirubin, Direct: 0.09 mg/dL (ref 0.00–0.40)
Total Protein: 6.8 g/dL (ref 6.0–8.5)

## 2019-07-23 LAB — PSA: Prostate Specific Ag, Serum: 0.3 ng/mL (ref 0.0–4.0)

## 2019-07-23 LAB — MICROALBUMIN / CREATININE URINE RATIO
Creatinine, Urine: 132.2 mg/dL
Microalb/Creat Ratio: 5 mg/g creat (ref 0–29)
Microalbumin, Urine: 7.1 ug/mL

## 2019-07-23 LAB — HEMOGLOBIN A1C
Est. average glucose Bld gHb Est-mCnc: 163 mg/dL
Hgb A1c MFr Bld: 7.3 % — ABNORMAL HIGH (ref 4.8–5.6)

## 2019-07-26 ENCOUNTER — Inpatient Hospital Stay (HOSPITAL_COMMUNITY): Payer: BC Managed Care – PPO

## 2019-07-26 ENCOUNTER — Other Ambulatory Visit: Payer: Self-pay

## 2019-07-26 ENCOUNTER — Encounter (HOSPITAL_COMMUNITY): Payer: Self-pay | Admitting: Hematology

## 2019-07-26 ENCOUNTER — Inpatient Hospital Stay (HOSPITAL_BASED_OUTPATIENT_CLINIC_OR_DEPARTMENT_OTHER): Payer: BC Managed Care – PPO | Admitting: Hematology

## 2019-07-26 VITALS — BP 126/69 | HR 68 | Temp 97.1°F | Resp 18

## 2019-07-26 DIAGNOSIS — Z79899 Other long term (current) drug therapy: Secondary | ICD-10-CM | POA: Diagnosis not present

## 2019-07-26 DIAGNOSIS — K760 Fatty (change of) liver, not elsewhere classified: Secondary | ICD-10-CM | POA: Diagnosis not present

## 2019-07-26 DIAGNOSIS — C160 Malignant neoplasm of cardia: Secondary | ICD-10-CM | POA: Diagnosis present

## 2019-07-26 DIAGNOSIS — R634 Abnormal weight loss: Secondary | ICD-10-CM | POA: Diagnosis not present

## 2019-07-26 DIAGNOSIS — Z5111 Encounter for antineoplastic chemotherapy: Secondary | ICD-10-CM | POA: Diagnosis not present

## 2019-07-26 DIAGNOSIS — R1310 Dysphagia, unspecified: Secondary | ICD-10-CM | POA: Diagnosis not present

## 2019-07-26 LAB — CBC WITH DIFFERENTIAL/PLATELET
Abs Immature Granulocytes: 0.02 10*3/uL (ref 0.00–0.07)
Basophils Absolute: 0 10*3/uL (ref 0.0–0.1)
Basophils Relative: 1 %
Eosinophils Absolute: 0.1 10*3/uL (ref 0.0–0.5)
Eosinophils Relative: 1 %
HCT: 39.9 % (ref 39.0–52.0)
Hemoglobin: 12.9 g/dL — ABNORMAL LOW (ref 13.0–17.0)
Immature Granulocytes: 0 %
Lymphocytes Relative: 19 %
Lymphs Abs: 1.2 10*3/uL (ref 0.7–4.0)
MCH: 28 pg (ref 26.0–34.0)
MCHC: 32.3 g/dL (ref 30.0–36.0)
MCV: 86.6 fL (ref 80.0–100.0)
Monocytes Absolute: 0.4 10*3/uL (ref 0.1–1.0)
Monocytes Relative: 7 %
Neutro Abs: 4.4 10*3/uL (ref 1.7–7.7)
Neutrophils Relative %: 72 %
Platelets: 297 10*3/uL (ref 150–400)
RBC: 4.61 MIL/uL (ref 4.22–5.81)
RDW: 13.8 % (ref 11.5–15.5)
WBC: 6.1 10*3/uL (ref 4.0–10.5)
nRBC: 0 % (ref 0.0–0.2)

## 2019-07-26 LAB — COMPREHENSIVE METABOLIC PANEL
ALT: 25 U/L (ref 0–44)
AST: 19 U/L (ref 15–41)
Albumin: 3.8 g/dL (ref 3.5–5.0)
Alkaline Phosphatase: 38 U/L (ref 38–126)
Anion gap: 8 (ref 5–15)
BUN: 11 mg/dL (ref 6–20)
CO2: 25 mmol/L (ref 22–32)
Calcium: 8.8 mg/dL — ABNORMAL LOW (ref 8.9–10.3)
Chloride: 102 mmol/L (ref 98–111)
Creatinine, Ser: 0.75 mg/dL (ref 0.61–1.24)
GFR calc Af Amer: >60 mL/min (ref >60)
GFR calc non Af Amer: >60 mL/min (ref >60)
Glucose, Bld: 243 mg/dL — ABNORMAL HIGH (ref 70–99)
Potassium: 4.7 mmol/L (ref 3.5–5.1)
Sodium: 135 mmol/L (ref 135–145)
Total Bilirubin: 0.6 mg/dL (ref 0.3–1.2)
Total Protein: 6.7 g/dL (ref 6.5–8.1)

## 2019-07-26 LAB — MAGNESIUM: Magnesium: 1.9 mg/dL (ref 1.7–2.4)

## 2019-07-26 MED ORDER — PALONOSETRON HCL INJECTION 0.25 MG/5ML
0.2500 mg | Freq: Once | INTRAVENOUS | Status: AC
  Administered 2019-07-26: 11:00:00 0.25 mg via INTRAVENOUS
  Filled 2019-07-26: qty 5

## 2019-07-26 MED ORDER — SODIUM CHLORIDE 0.9 % IV SOLN
20.0000 mg | Freq: Once | INTRAVENOUS | Status: AC
  Administered 2019-07-26: 12:00:00 20 mg via INTRAVENOUS
  Filled 2019-07-26: qty 20

## 2019-07-26 MED ORDER — SODIUM CHLORIDE 0.9 % IV SOLN
50.0000 mg/m2 | Freq: Once | INTRAVENOUS | Status: AC
  Administered 2019-07-26: 13:00:00 132 mg via INTRAVENOUS
  Filled 2019-07-26: qty 22

## 2019-07-26 MED ORDER — SODIUM CHLORIDE 0.9 % IV SOLN: Freq: Once | INTRAVENOUS | Status: AC

## 2019-07-26 MED ORDER — SODIUM CHLORIDE 0.9 % IV SOLN
300.0000 mg | Freq: Once | INTRAVENOUS | Status: AC
  Administered 2019-07-26: 14:00:00 300 mg via INTRAVENOUS
  Filled 2019-07-26: qty 30

## 2019-07-26 MED ORDER — SODIUM CHLORIDE 0.9% FLUSH: 10.0000 mL | INTRAVENOUS | Status: DC | PRN

## 2019-07-26 MED ORDER — HEPARIN SOD (PORK) LOCK FLUSH 100 UNIT/ML IV SOLN: 500.0000 [IU] | Freq: Once | INTRAVENOUS | Status: DC | PRN

## 2019-07-26 MED ORDER — FULVESTRANT 250 MG/5ML IM SOLN
INTRAMUSCULAR | Status: AC
  Filled 2019-07-26: qty 10

## 2019-07-26 MED ORDER — FAMOTIDINE IN NACL 20-0.9 MG/50ML-% IV SOLN
20.0000 mg | Freq: Once | INTRAVENOUS | Status: AC
  Administered 2019-07-26: 20 mg via INTRAVENOUS
  Filled 2019-07-26: qty 50

## 2019-07-26 MED ORDER — DIPHENHYDRAMINE HCL 50 MG/ML IJ SOLN
50.0000 mg | Freq: Once | INTRAMUSCULAR | Status: AC
  Administered 2019-07-26: 12:00:00 50 mg via INTRAVENOUS
  Filled 2019-07-26: qty 1

## 2019-07-26 NOTE — Progress Notes (Signed)
.   Pharmacist Chemotherapy Monitoring - Initial Assessment    Anticipated start date: 07/26/19   Regimen:  . Are orders appropriate based on the patient's diagnosis, regimen, and cycle? Yes . Does the plan date match the patient's scheduled date? Yes . Is the sequencing of drugs appropriate? Yes . Are the premedications appropriate for the patient's regimen? Yes . Prior Authorization for treatment is: Approved o If applicable, is the correct biosimilar selected based on the patient's insurance? not applicable  Organ Function and Labs: Marland Kitchen Are dose adjustments needed based on the patient's renal function, hepatic function, or hematologic function? No . Are appropriate labs ordered prior to the start of patient's treatment? Yes . Other organ system assessment, if indicated: N/A . The following baseline labs, if indicated, have been ordered: N/A  Dose Assessment: . Are the drug doses appropriate? Yes . Are the following correct: o Drug concentrations Yes o IV fluid compatible with drug Yes o Administration routes Yes o Timing of therapy Yes . If applicable, does the patient have documented access for treatment and/or plans for port-a-cath placement? not applicable . If applicable, have lifetime cumulative doses been properly documented and assessed? not applicable Lifetime Dose Tracking  No doses have been documented on this patient for the following tracked chemicals: Doxorubicin, Epirubicin, Idarubicin, Daunorubicin, Mitoxantrone, Bleomycin, Oxaliplatin, Carboplatin, Liposomal Doxorubicin  o   Toxicity Monitoring/Prevention: . The patient has the following take home antiemetics prescribed: Prochlorperazine . The patient has the following take home medications prescribed: N/A . Medication allergies and previous infusion related reactions, if applicable, have been reviewed and addressed. Yes . The patient's current medication list has been assessed for drug-drug interactions with their  chemotherapy regimen. no significant drug-drug interactions were identified on review.  Order Review: . Are the treatment plan orders signed? Yes . Is the patient scheduled to see a provider prior to their treatment? Yes  I verify that I have reviewed each item in the above checklist and answered each question accordingly.  Steven Crane 07/26/2019 10:59 AM

## 2019-07-26 NOTE — Progress Notes (Signed)
Patient has been assessed, vital signs and labs have been reviewed by Dr. Katragadda. ANC, Creatinine, LFTs, and Platelets are within treatment parameters per Dr. Katragadda. The patient is good to proceed with treatment at this time.  

## 2019-07-26 NOTE — Assessment & Plan Note (Signed)
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 -EGD on 05/28/2019 showed distal esophageal mass extending from 33 cm to 38 cm (GE junction) with an additional 3 cm competent involving the proximal cardia of the stomach. -PET scan on 07/20/2019 shows uptake at the mass.  Gastrohepatic ligament lymph nodes are not hypermetabolic.  No metastatic disease. -He was also evaluated by Dr. Kipp Brood.  I have discussed with Dr. Kipp Brood.  We do not need any T staging at this time. -He started radiation therapy today.  We discussed starting him on chemotherapy with weekly carboplatin and paclitaxel.  We discussed the side effects in detail. -I have reviewed his labs today including LFTs which are grossly within normal limits. -He will proceed with first treatment today.  I will reevaluate him in 1 week with labs.  2.  Weight loss/dysphagia: -He lost 30+ pounds in the last 3 months. -He lost 3 pounds in the last 1 week. -He is drinking 2 protein shakes per day.  I have told him to increase the protein shakes to 3 to 4/day.  He will take stool softener twice daily for constipation.  He is able to eat soft foods.  3.  Family history: -Maternal aunt and 2 maternal uncles who died of cancer.  One paternal aunt died of lung cancer.  Multiple maternal cousins died of cancers.  Patient does not know exact types. -He will benefit from genetic evaluation and testing.  4.  Diabetes: -He will continue Glucovance 2 tablets twice daily and Levemir 70 units twice daily.

## 2019-07-26 NOTE — Progress Notes (Signed)
[place] reviewed with and pt seen by Dr. Delton Coombes and pt approved for Taxol and Carboplatin infusions today per MD           Desiree Hane Jarvis tolerated chemo tx well without complaints or incident. Peripheral IV checked by 2 RN's with positive blood return noted prior to and after each infusion. VSS upon discharge. Pt discharged self ambulatory in satisfactory condition accompanied by family member

## 2019-07-26 NOTE — Progress Notes (Signed)
Steven Crane, [place] [address]   CLINIC:  Medical Oncology/Hematology  PCP:  [name], [place] [address] [phone]   REASON FOR VISIT:  Follow-up for GE junction adenocarcinoma.  CURRENT THERAPY: Chemoradiation therapy.    INTERVAL HISTORY:  [name] 59 y.o. male seen for follow-up of GE junction adenocarcinoma and treatment initiation.  Denies any tingling or numbness next 20s.  Appetite and energy levels are 75%.  Lost 3 pounds in the last 1 week.  He is able to eat soft foods.  He is drinking 2 protein shakes per day.  He is also taking Colace 1 to 2 pills/day.    REVIEW OF SYSTEMS:  Review of Systems  Gastrointestinal: Positive for constipation.  All other systems reviewed and are negative.    PAST MEDICAL/SURGICAL HISTORY:  Past Medical History:  Diagnosis Date  . Anxiety   . Arthritis    "hands, back, knees" (08/27/2013)  . Asthma   . CHF (congestive heart failure) ([facility]) 02/2010  . Chronic bronchitis ([facility])    "get it q year"  . Chronic lower back pain   . Closed head injury 1975  . Coronary artery disease May 2015   s/p successful PTCA/DES x 1 to distal RCA and PTCA/DES x 1 to OM-20 Aug 2013  . Depression   . DVT (deep venous thrombosis) ([facility]) 2008   "LLE"  . GERD (gastroesophageal reflux disease)   . Headache    hs of but no longer has problems   . History of kidney stones   . Hyperlipemia   . Hypertension   . Hypertriglyceridemia   . LV dysfunction    LVEF 40% at time of cardiac cath May 2015  . Morbid obesity ([facility])   . Myocardial infarction ([facility]) 02/2010  . Psoriasis   . Type II diabetes mellitus ([facility])    Past Surgical History:  Procedure Laterality Date  . BIOPSY  06/17/2019   Procedure: BIOPSY;  Surgeon: [name], MD;  Location: [facility];  Service: Endoscopy;;  . COLONOSCOPY  01/06/2012   Dr. [name]: suboptimal prep. normal exam. next colonoscopy in 10  years.   . CORONARY ANGIOPLASTY WITH STENT PLACEMENT  02/2010; 08/27/2013   "1 + 3"   . ESOPHAGOGASTRODUODENOSCOPY (EGD) WITH PROPOFOL N/A 06/17/2019   Procedure: ESOPHAGOGASTRODUODENOSCOPY (EGD) WITH PROPOFOL;  Surgeon: [name], MD;  Location: [facility];  Service: Endoscopy;  Laterality: N/A;  11:15am  . KNEE ARTHROSCOPY Left 1981  . LEFT HEART CATHETERIZATION WITH CORONARY ANGIOGRAM N/A 08/27/2013   Procedure: LEFT HEART CATHETERIZATION WITH CORONARY ANGIOGRAM;  Surgeon: [name], MD;  Location: [facility] CATH LAB;  Service: Cardiovascular;  Laterality: N/A;  . right wrist fracture surgery     . TIBIA IM NAIL INSERTION Left 08/21/2018   Procedure: INTRAMEDULLARY (IM) NAIL TIBIAL;  Surgeon: Rod Can, MD;  Location: [facility] ORS;  Service: Orthopedics;  Laterality: Left;     SOCIAL HISTORY:  Social History   Socioeconomic History  . Marital status: Single    Spouse name: Not on file  . Number of children: 3  . Years of education: Not on file  . Highest education level: Not on file  Occupational History  . Not on file  Tobacco Use  . Smoking status: Never Smoker  . Smokeless tobacco: Current User    Types: Snuff, Chew  . Tobacco comment: "started dipping @ age 20; quit for  5 1/2 yrs at one time; hadn't chewed in awhile"  Substance and Sexual Activity  . Alcohol use: Yes    Alcohol/week: 0.0 standard drinks    Comment: occ   . Drug use: No  . Sexual activity: Yes  Other Topics Concern  . Not on file  Social History Narrative  . Not on file   Social Determinants of Health   Financial Resource Strain: Low Risk   . Difficulty of Paying Living Expenses: Not hard at all  Food Insecurity: No Food Insecurity  . Worried About Charity fundraiser in the Last Year: Never true  . Ran Out of Food in the Last Year: Never true  Transportation Needs: No Transportation Needs  . Lack of Transportation (Medical): No  . Lack of Transportation (Non-Medical): No  Physical  Activity: Insufficiently Active  . Days of Exercise per Week: 1 day  . Minutes of Exercise per Session: 20 min  Stress: No Stress Concern Present  . Feeling of Stress : Not at all  Social Connections: Slightly Isolated  . Frequency of Communication with Friends and Family: Three times a week  . Frequency of Social Gatherings with Friends and Family: Three times a week  . Attends Religious Services: 1 to 4 times per year  . Active Member of Clubs or Organizations: No  . Attends Archivist Meetings: Never  . Marital Status: Living with partner  Intimate Partner Violence: Not At Risk  . Fear of Current or Ex-Partner: No  . Emotionally Abused: No  . Physically Abused: No  . Sexually Abused: No    FAMILY HISTORY:  Family History  Problem Relation Age of Onset  . Coronary artery disease Father   . Diabetes type II Father   . Diabetes Father   . Heart attack Father   . Hypertension Mother     CURRENT MEDICATIONS:  Outpatient Encounter Medications as of 07/26/2019  Medication Sig Note  . acetaminophen (TYLENOL) 500 MG tablet Take 1,000 mg by mouth in the morning and at bedtime.    Marland Kitchen albuterol (PROVENTIL HFA;VENTOLIN HFA) 108 (90 Base) MCG/ACT inhaler Inhale 2 puffs into the lungs every 6 (six) hours as needed for wheezing or shortness of breath.   Marland Kitchen albuterol (VENTOLIN HFA) 108 (90 Base) MCG/ACT inhaler INHALE 2 PUFFS BY MOUTH EVERY 6 HOURS AS NEEDED FOR WHEEZING FOR SHORTNESS OF BREATH (Patient not taking: Reported on 07/21/2019)   . ALPRAZolam (XANAX) 0.5 MG tablet TAKE ONE-HALF TO ONE TABLET BY MOUTH ONCE DAILY AS NEEDED (Patient taking differently: Take 0.5-0.75 mg by mouth daily as needed for anxiety. )   . amLODipine (NORVASC) 5 MG tablet Take 1 tablet by mouth once daily (Patient taking differently: Take 5 mg by mouth daily. )   . aspirin EC 81 MG tablet Take 1 tablet (81 mg total) by mouth daily.   Marland Kitchen atorvastatin (LIPITOR) 40 MG tablet Take 1 tablet by mouth once daily  (Patient taking differently: Take 40 mg by mouth at bedtime. )   . blood glucose meter kit and supplies Test glucose once daily. ICD 10 E11.9   . dexlansoprazole (DEXILANT) 60 MG capsule Take 60 mg by mouth daily as needed (reflux).    . enalapril (VASOTEC) 20 MG tablet Take 1 tablet (20 mg total) by mouth 2 (two) times daily.   . fenofibrate 160 MG tablet Take 1 tablet by mouth once daily (Patient taking differently: Take 160 mg by mouth at bedtime. )   .  glyBURIDE-metformin (GLUCOVANCE) 5-500 MG tablet Take 2 tablets by mouth 2 (two) times daily.   . insulin detemir (LEVEMIR) 100 UNIT/ML injection Inject 70 Units into the skin 2 (two) times daily.  07/23/2019: Oh hold  . lidocaine-prilocaine (EMLA) cream Apply a small amount to port a cath site and cover with plastic wrap one hour prior to infusion appointments 07/23/2019: Have not started yet  . metoprolol succinate (TOPROL-XL) 50 MG 24 hr tablet TAKE 1 TABLET BY MOUTH ONCE DAILY WITH  OR  IMMEDIATELY  FOLLOWING  A  MEAL (Patient taking differently: Take 50 mg by mouth daily. )   . PACLITAXEL IV Inject 50 mg/m2 into the vein once a week.  07/23/2019: Have not started   . prochlorperazine (COMPAZINE) 10 MG tablet Take 1 tablet (10 mg total) by mouth every 6 (six) hours as needed for nausea or vomiting. 07/23/2019: Have not started yet   No facility-administered encounter medications on file as of 07/26/2019.    ALLERGIES:  Allergies  Allergen Reactions  . Adhesive [Tape] Rash     PHYSICAL EXAM:  ECOG Performance status: 1  Vitals:   07/26/19 1000  BP: 137/68  Pulse: 71  Resp: 16  Temp: (!) 97.3 F (36.3 C)  SpO2: 99%   Filed Weights   07/26/19 1000  Weight: 278 lb 3.2 oz (126.2 kg)    Physical Exam Vitals reviewed.  Constitutional:      Appearance: Normal appearance.  Cardiovascular:     Rate and Rhythm: Normal rate and regular rhythm.     Heart sounds: Normal heart sounds.  Pulmonary:     Effort: Pulmonary effort is normal.      Breath sounds: Normal breath sounds.  Abdominal:     General: There is no distension.     Palpations: Abdomen is soft. There is no mass.  Skin:    General: Skin is warm.  Neurological:     General: No focal deficit present.     Mental Status: He is alert and oriented to person, place, and time.  Psychiatric:        Mood and Affect: Mood normal.        Behavior: Behavior normal.      LABORATORY DATA:  I have reviewed the labs as listed.  CBC    Component Value Date/Time   WBC 6.1 07/26/2019 0937   RBC 4.61 07/26/2019 0937   HGB 12.9 (L) 07/26/2019 0937   HCT 39.9 07/26/2019 0937   PLT 297 07/26/2019 0937   MCV 86.6 07/26/2019 0937   MCH 28.0 07/26/2019 0937   MCHC 32.3 07/26/2019 0937   RDW 13.8 07/26/2019 0937   LYMPHSABS 1.2 07/26/2019 0937   MONOABS 0.4 07/26/2019 0937   EOSABS 0.1 07/26/2019 0937   BASOSABS 0.0 07/26/2019 0937   CMP Latest Ref Rng & Units 07/26/2019 07/22/2019 06/15/2019  Glucose 70 - 99 mg/dL 243(H) 134(H) 178(H)  BUN 6 - 20 mg/dL '11 11 9  ' Creatinine 0.61 - 1.24 mg/dL 0.75 0.69(L) 0.70  Sodium 135 - 145 mmol/L 135 141 133(L)  Potassium 3.5 - 5.1 mmol/L 4.7 5.2 4.3  Chloride 98 - 111 mmol/L 102 105 101  CO2 22 - 32 mmol/L '25 21 26  ' Calcium 8.9 - 10.3 mg/dL 8.8(L) 9.3 8.6(L)  Total Protein 6.5 - 8.1 g/dL 6.7 6.8 -  Total Bilirubin 0.3 - 1.2 mg/dL 0.6 0.2 -  Alkaline Phos 38 - 126 U/L 38 49 -  AST 15 - 41 U/L 19 14 -  ALT 0 - 44 U/L 25 22 -       DIAGNOSTIC IMAGING:  I have reviewed scans.     ASSESSMENT & PLAN:   Primary adenocarcinoma of gastroesophageal junction (HCC) 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 -EGD on 05/28/2019 showed distal esophageal mass extending from 33 cm to 38 cm (GE junction) with an additional 3 cm competent involving the proximal cardia of the stomach. -PET scan on 07/20/2019 shows uptake at the mass.  Gastrohepatic ligament lymph nodes are not  hypermetabolic.  No metastatic disease. -He was also evaluated by Dr. Kipp Brood.  I have discussed with Dr. Kipp Brood.  We do not need any T staging at this time. -He started radiation therapy today.  We discussed starting him on chemotherapy with weekly carboplatin and paclitaxel.  We discussed the side effects in detail. -I have reviewed his labs today including LFTs which are grossly within normal limits. -He will proceed with first treatment today.  I will reevaluate him in 1 week with labs.  2.  Weight loss/dysphagia: -He lost 30+ pounds in the last 3 months. -He lost 3 pounds in the last 1 week. -He is drinking 2 protein shakes per day.  I have told him to increase the protein shakes to 3 to 4/day.  He will take stool softener twice daily for constipation.  He is able to eat soft foods.  3.  Family history: -Maternal aunt and 2 maternal uncles who died of cancer.  One paternal aunt died of lung cancer.  Multiple maternal cousins died of cancers.  Patient does not know exact types. -He will benefit from genetic evaluation and testing.  4.  Diabetes: -He will continue Glucovance 2 tablets twice daily and Levemir 70 units twice daily.      Orders placed this encounter:  No orders of the defined types were placed in this encounter.    Derek Jack, MD [place] [phone]

## 2019-07-26 NOTE — Patient Instructions (Signed)
[facility] Discharge Instructions for Patients Receiving Chemotherapy   Beginning January 23rd 2017 lab work for the Cancer Center will be done in the  Main lab at [facility] on 1st floor. If you have a lab appointment with the Cancer Center please come in thru the  Main Entrance and check in at the main information desk   Today you received the following chemotherapy agents Taxol and Carboplatin. Follow-up as scheduled. Call clinic for any questions or concerns  To help prevent nausea and vomiting after your treatment, we encourage you to take your nausea medication.   If you develop nausea and vomiting, or diarrhea that is not controlled by your medication, call the clinic.  The clinic phone number is [phone]. Office hours are Monday-Friday 8:30am-5:00pm.  BELOW ARE SYMPTOMS THAT SHOULD BE REPORTED IMMEDIATELY:  *FEVER GREATER THAN 101.0 F  *CHILLS WITH OR WITHOUT FEVER  NAUSEA AND VOMITING THAT IS NOT CONTROLLED WITH YOUR NAUSEA MEDICATION  *UNUSUAL SHORTNESS OF BREATH  *UNUSUAL BRUISING OR BLEEDING  TENDERNESS IN MOUTH AND THROAT WITH OR WITHOUT PRESENCE OF ULCERS  *URINARY PROBLEMS  *BOWEL PROBLEMS  UNUSUAL RASH Items with * indicate a potential emergency and should be followed up as soon as possible. If you have an emergency after office hours please contact your primary care physician or go to the nearest emergency department.  Please call the clinic during office hours if you have any questions or concerns.   You may also contact the Patient Navigator at [phone] should you have any questions or need assistance in obtaining follow up care.      Resources For Cancer Patients and their Caregivers ? American Cancer Society: Can assist with transportation, wigs, general needs, runs Look Good Feel Better.        1-888-227-6333 ? Cancer Care: Provides financial assistance, online support groups, medication/co-pay assistance.   1-800-813-HOPE (4673) ? [facility] Assists [place] cancer patients and their families through emotional , educational and financial support.  336-427-4357 ? [place] DSS Where to apply for food stamps, Medicaid and utility assistance. 336-342-1394 ? [facility]: Transportation to medical appointments. 336-347-2287 ? Social Security Administration: May apply for disability if have a Stage IV cancer. 336-342-7796 1-800-772-1213 ? [place] Aging, Disability and Transit Services: Assists with nutrition, care and transit needs. 336-349-2343         

## 2019-07-26 NOTE — Patient Instructions (Addendum)
[place] at [facility] Discharge Instructions  You were seen today by Dr. Delton Coombes. He went over your recent lab results. Dr. Delton Coombes wants you to increase your protein drinks to at least 3 per day because of weight loss.  He will see you back in one week for labs and follow up.   Thank you for choosing [place] at [facility] to provide your oncology and hematology care.  To afford each patient quality time with our provider, please arrive at least 15 minutes before your scheduled appointment time.   If you have a lab appointment with the [place] please come in thru the  Main Entrance and check in at the main information desk  You need to re-schedule your appointment should you arrive 10 or more minutes late.  We strive to give you quality time with our providers, and arriving late affects you and other patients whose appointments are after yours.  Also, if you no show three or more times for appointments you may be dismissed from the clinic at the providers discretion.     Again, thank you for choosing [facility].  Our hope is that these requests will decrease the amount of time that you wait before being seen by our physicians.       _____________________________________________________________  Should you have questions after your visit to [facility], please contact our office at [phone] between the hours of 8:00 a.m. and 4:30 p.m.  Voicemails left after 4:00 p.m. will not be returned until the following business day.  For prescription refill requests, have your pharmacy contact our office and allow 72 hours.    Cancer Center Support Programs:   > Cancer Support Group  2nd Tuesday of the month 1pm-2pm, Journey Room

## 2019-07-27 ENCOUNTER — Telehealth (HOSPITAL_COMMUNITY): Payer: Self-pay

## 2019-07-27 ENCOUNTER — Ambulatory Visit: Payer: BC Managed Care – PPO | Admitting: Family Medicine

## 2019-07-27 VITALS — BP 120/66

## 2019-07-27 DIAGNOSIS — E785 Hyperlipidemia, unspecified: Secondary | ICD-10-CM

## 2019-07-27 DIAGNOSIS — I10 Essential (primary) hypertension: Secondary | ICD-10-CM

## 2019-07-27 DIAGNOSIS — E119 Type 2 diabetes mellitus without complications: Secondary | ICD-10-CM

## 2019-07-27 DIAGNOSIS — C155 Malignant neoplasm of lower third of esophagus: Secondary | ICD-10-CM | POA: Diagnosis not present

## 2019-07-27 MED ORDER — METOPROLOL SUCCINATE ER 50 MG PO TB24: ORAL_TABLET | ORAL | 1 refills | Status: DC

## 2019-07-27 MED ORDER — GLYBURIDE-METFORMIN 5-500 MG PO TABS: 2.0000 | ORAL_TABLET | Freq: Two times a day (BID) | ORAL | 1 refills | Status: DC

## 2019-07-27 MED ORDER — FENOFIBRATE 160 MG PO TABS: 160.0000 mg | ORAL_TABLET | Freq: Every day | ORAL | 1 refills | Status: DC

## 2019-07-27 MED ORDER — AMLODIPINE BESYLATE 5 MG PO TABS: 5.0000 mg | ORAL_TABLET | Freq: Every day | ORAL | 1 refills | Status: DC

## 2019-07-27 MED ORDER — ALPRAZOLAM 0.5 MG PO TABS: ORAL_TABLET | ORAL | 5 refills | Status: DC

## 2019-07-27 MED ORDER — ENALAPRIL MALEATE 20 MG PO TABS: 20.0000 mg | ORAL_TABLET | Freq: Two times a day (BID) | ORAL | 1 refills | Status: DC

## 2019-07-27 MED ORDER — DEXILANT 60 MG PO CPDR: 60.0000 mg | DELAYED_RELEASE_CAPSULE | Freq: Every day | ORAL | 1 refills | Status: DC | PRN

## 2019-07-27 NOTE — Progress Notes (Signed)
Subjective:    Patient ID: Steven Crane, male    DOB: 1960-11-12, 59 y.o.   MRN: [id number]  HPI Patient comes in today for medication follow up.  Patient states he is doing well but would like to discuss his insulin.  Patient has started chemo treatments and will have the cath inserted on Friday.  Results for orders placed or performed in visit on 07/26/19  CBC with Differential/Platelet  Result Value Ref Range   WBC 6.1 4.0 - 10.5 K/uL   RBC 4.61 4.22 - 5.81 MIL/uL   Hemoglobin 12.9 (L) 13.0 - 17.0 g/dL   HCT 39.9 39.0 - 52.0 %   MCV 86.6 80.0 - 100.0 fL   MCH 28.0 26.0 - 34.0 pg   MCHC 32.3 30.0 - 36.0 g/dL   RDW 13.8 11.5 - 15.5 %   Platelets 297 150 - 400 K/uL   nRBC 0.0 0.0 - 0.2 %   Neutrophils Relative % 72 %   Neutro Abs 4.4 1.7 - 7.7 K/uL   Lymphocytes Relative 19 %   Lymphs Abs 1.2 0.7 - 4.0 K/uL   Monocytes Relative 7 %   Monocytes Absolute 0.4 0.1 - 1.0 K/uL   Eosinophils Relative 1 %   Eosinophils Absolute 0.1 0.0 - 0.5 K/uL   Basophils Relative 1 %   Basophils Absolute 0.0 0.0 - 0.1 K/uL   Immature Granulocytes 0 %   Abs Immature Granulocytes 0.02 0.00 - 0.07 K/uL  Comprehensive metabolic panel  Result Value Ref Range   Sodium 135 135 - 145 mmol/L   Potassium 4.7 3.5 - 5.1 mmol/L   Chloride 102 98 - 111 mmol/L   CO2 25 22 - 32 mmol/L   Glucose, Bld 243 (H) 70 - 99 mg/dL   BUN 11 6 - 20 mg/dL   Creatinine, Ser 0.75 0.61 - 1.24 mg/dL   Calcium 8.8 (L) 8.9 - 10.3 mg/dL   Total Protein 6.7 6.5 - 8.1 g/dL   Albumin 3.8 3.5 - 5.0 g/dL   AST 19 15 - 41 U/L   ALT 25 0 - 44 U/L   Alkaline Phosphatase 38 38 - 126 U/L   Total Bilirubin 0.6 0.3 - 1.2 mg/dL   GFR calc non Af Amer >60 >60 mL/min   GFR calc Af Amer >60 >60 mL/min   Anion gap 8 5 - 15  Magnesium  Result Value Ref Range   Magnesium 1.9 1.7 - 2.4 mg/dL   Recent Results (from the past 2160 hour(s))  HM DIABETES EYE EXAM     Status: None   Collection Time: 05/12/19  3:54 PM  Result Value Ref  Range   HM Diabetic Eye Exam No Retinopathy No Retinopathy  SARS CORONAVIRUS 2 (TAT 6-24 HRS) Nasopharyngeal Nasopharyngeal Swab     Status: None   Collection Time: 06/15/19  6:51 AM   Specimen: Nasopharyngeal Swab  Result Value Ref Range   SARS Coronavirus 2 NEGATIVE NEGATIVE    Comment: (NOTE) SARS-CoV-2 target nucleic acids are NOT DETECTED. The SARS-CoV-2 RNA is generally detectable in upper and lower respiratory specimens during the acute phase of infection. Negative results do not preclude SARS-CoV-2 infection, do not rule out co-infections with other pathogens, and should not be used as the sole basis for treatment or other patient management decisions. Negative results must be combined with clinical observations, patient history, and epidemiological information. The expected result is Negative. Fact Sheet for Patients: SugarRoll.be Fact Sheet for Healthcare Providers: https://www.woods-mathews.com/ This test  is not yet approved or cleared by the [place] and  has been authorized for detection and/or diagnosis of SARS-CoV-2 by FDA under an Emergency Use Authorization (EUA). This EUA will remain  in effect (meaning this test can be used) for the duration of the COVID-19 declaration under Section 56 4(b)(1) of the Act, 21 U.S.C. section 360bbb-3(b)(1), unless the authorization is terminated or revoked sooner. Performed at [facility], [place] [address] [place], [place] [id number]   Basic metabolic panel     Status: Abnormal   Collection Time: 06/15/19  2:15 PM  Result Value Ref Range   Sodium 133 (L) 135 - 145 mmol/L   Potassium 4.3 3.5 - 5.1 mmol/L   Chloride 101 98 - 111 mmol/L   CO2 26 22 - 32 mmol/L   Glucose, Bld 178 (H) 70 - 99 mg/dL    Comment: Glucose reference range applies only to samples taken after fasting for at least 8 hours.   BUN 9 6 - 20 mg/dL   Creatinine, Ser 0.70 0.61 - 1.24 mg/dL   Calcium 8.6 (L)  8.9 - 10.3 mg/dL   GFR calc non Af Amer >60 >60 mL/min   GFR calc Af Amer >60 >60 mL/min   Anion gap 6 5 - 15    Comment: Performed at [facility], [address], [place], [place] [address]  Glucose, capillary     Status: Abnormal   Collection Time: 06/17/19  9:59 AM  Result Value Ref Range   Glucose-Capillary 113 (H) 70 - 99 mg/dL    Comment: Glucose reference range applies only to samples taken after fasting for at least 8 hours.  Surgical pathology     Status: None   Collection Time: 06/17/19 10:51 AM  Result Value Ref Range   SURGICAL PATHOLOGY      SURGICAL PATHOLOGY CASE: [id number] PATIENT: Mertha Baars Surgical Pathology Report     Clinical History: dysphagia, GERD     FINAL MICROSCOPIC DIAGNOSIS:  A. ESOPHAGUS, TUMOR, BIOPSY: - Adenocarcinoma with signet ring cells. - See comment.  COMMENT: Dr. Tresa Moore agrees.  Call to Dr. [name] on 06/18/2019.   GROSS DESCRIPTION: Received in formalin are tan, soft tissue fragments that are submitted in toto. Number: Multiple size: Average 0.2 cm blocks: 1 (AK 06/17/2019)  Final Diagnosis performed by Claudette Laws, MD.   Electronically signed 06/18/2019 Technical component performed at [facility], [place] [address], [place], [place] [address].  Professional component performed at [facility], [place] [address], [place], [place] [address].  Immunohistochemistry Technical component (if applicable) was performed at [facility] [address] [place], [place], [place] [address].   IMMUNOHISTOCHEMISTRY DISCLAI MER (if applicable): Some of these immunohistochemical stains may have been developed and the performance characteristics determine by [facility]. Some may not have been cleared or approved by the U.S. Food and Drug Administration. The FDA has determined that such clearance or approval is not necessary. This test is used for clinical purposes. It  should not be regarded as investigational or for research. This laboratory is certified under the [place] (CLIA-88) as qualified to perform high complexity clinical laboratory testing.  The controls stained appropriately.   Glucose, capillary     Status: Abnormal   Collection Time: 07/20/19 12:04 PM  Result Value Ref Range   Glucose-Capillary 118 (H) 70 - 99 mg/dL    Comment: Glucose reference range applies only to samples taken  after fasting for at least 8 hours.  Lipid panel     Status: Abnormal   Collection Time: 07/22/19  8:07 AM  Result Value Ref Range   Cholesterol, Total 117 100 - 199 mg/dL   Triglycerides 115 0 - 149 mg/dL   HDL 34 (L) >39 mg/dL   VLDL Cholesterol Cal 21 5 - 40 mg/dL   LDL Chol Calc (NIH) 62 0 - 99 mg/dL   Chol/HDL Ratio 3.4 0.0 - 5.0 ratio    Comment:                                   T. Chol/HDL Ratio                                             Men  Women                               1/2 Avg.Risk  3.4    3.3                                   Avg.Risk  5.0    4.4                                2X Avg.Risk  9.6    7.1                                3X Avg.Risk 23.4   11.0   Hepatic function panel     Status: None   Collection Time: 07/22/19  8:07 AM  Result Value Ref Range   Total Protein 6.8 6.0 - 8.5 g/dL   Albumin 4.2 3.8 - 4.9 g/dL   Bilirubin Total 0.2 0.0 - 1.2 mg/dL   Bilirubin, Direct 0.09 0.00 - 0.40 mg/dL   Alkaline Phosphatase 49 39 - 117 IU/L   AST 14 0 - 40 IU/L   ALT 22 0 - 44 IU/L  Hemoglobin A1c     Status: Abnormal   Collection Time: 07/22/19  8:07 AM  Result Value Ref Range   Hgb A1c MFr Bld 7.3 (H) 4.8 - 5.6 %    Comment:          Prediabetes: 5.7 - 6.4          Diabetes: >6.4          Glycemic control for adults with diabetes: <7.0    Est. average glucose Bld gHb Est-mCnc 163 mg/dL  Basic metabolic panel     Status: Abnormal   Collection Time: 07/22/19  8:07 AM  Result Value Ref  Range   Glucose 134 (H) 65 - 99 mg/dL   BUN 11 6 - 24 mg/dL   Creatinine, Ser 0.69 (L) 0.76 - 1.27 mg/dL   GFR calc non Af Amer 105 >59 mL/min/1.73   GFR calc Af Amer 121 >59 mL/min/1.73   BUN/Creatinine Ratio 16 9 - 20   Sodium 141 134 - 144 mmol/L   Potassium 5.2 3.5 - 5.2 mmol/L   Chloride 105 96 - 106  mmol/L   CO2 21 20 - 29 mmol/L   Calcium 9.3 8.7 - 10.2 mg/dL  PSA     Status: None   Collection Time: 07/22/19  8:07 AM  Result Value Ref Range   Prostate Specific Ag, Serum 0.3 0.0 - 4.0 ng/mL    Comment: Roche ECLIA methodology. According to the American Urological Association, Serum PSA should decrease and remain at undetectable levels after radical prostatectomy. The AUA defines biochemical recurrence as an initial PSA value 0.2 ng/mL or greater followed by a subsequent confirmatory PSA value 0.2 ng/mL or greater. Values obtained with different assay methods or kits cannot be used interchangeably. Results cannot be interpreted as absolute evidence of the presence or absence of malignant disease.   Microalbumin / creatinine urine ratio     Status: None   Collection Time: 07/22/19  8:07 AM  Result Value Ref Range   Creatinine, Urine 132.2 Not Estab. mg/dL   Microalbumin, Urine 7.1 Not Estab. ug/mL   Microalb/Creat Ratio 5 0 - 29 mg/g creat    Comment:                        Normal:                0 -  29                        Moderately increased: 30 - 300                        Severely increased:       >300   CBC with Differential/Platelet     Status: Abnormal   Collection Time: 07/26/19  9:37 AM  Result Value Ref Range   WBC 6.1 4.0 - 10.5 K/uL   RBC 4.61 4.22 - 5.81 MIL/uL   Hemoglobin 12.9 (L) 13.0 - 17.0 g/dL   HCT 39.9 39.0 - 52.0 %   MCV 86.6 80.0 - 100.0 fL   MCH 28.0 26.0 - 34.0 pg   MCHC 32.3 30.0 - 36.0 g/dL   RDW 13.8 11.5 - 15.5 %   Platelets 297 150 - 400 K/uL   nRBC 0.0 0.0 - 0.2 %   Neutrophils Relative % 72 %   Neutro Abs 4.4 1.7 - 7.7 K/uL    Lymphocytes Relative 19 %   Lymphs Abs 1.2 0.7 - 4.0 K/uL   Monocytes Relative 7 %   Monocytes Absolute 0.4 0.1 - 1.0 K/uL   Eosinophils Relative 1 %   Eosinophils Absolute 0.1 0.0 - 0.5 K/uL   Basophils Relative 1 %   Basophils Absolute 0.0 0.0 - 0.1 K/uL   Immature Granulocytes 0 %   Abs Immature Granulocytes 0.02 0.00 - 0.07 K/uL    Comment: Performed at [facility], [address] [place], [place] [address]  Comprehensive metabolic panel     Status: Abnormal   Collection Time: 07/26/19  9:37 AM  Result Value Ref Range   Sodium 135 135 - 145 mmol/L   Potassium 4.7 3.5 - 5.1 mmol/L   Chloride 102 98 - 111 mmol/L   CO2 25 22 - 32 mmol/L   Glucose, Bld 243 (H) 70 - 99 mg/dL    Comment: Glucose reference range applies only to samples taken after fasting for at least 8 hours.   BUN 11 6 - 20 mg/dL   Creatinine, Ser 0.75 0.61 - 1.24 mg/dL  Calcium 8.8 (L) 8.9 - 10.3 mg/dL   Total Protein 6.7 6.5 - 8.1 g/dL   Albumin 3.8 3.5 - 5.0 g/dL   AST 19 15 - 41 U/L   ALT 25 0 - 44 U/L   Alkaline Phosphatase 38 38 - 126 U/L   Total Bilirubin 0.6 0.3 - 1.2 mg/dL   GFR calc non Af Amer >60 >60 mL/min   GFR calc Af Amer >60 >60 mL/min   Anion gap 8 5 - 15    Comment: Performed at [facility], [address] [place], [place] [address]  Magnesium     Status: None   Collection Time: 07/26/19  9:37 AM  Result Value Ref Range   Magnesium 1.9 1.7 - 2.4 mg/dL    Comment: Performed at [facility], [address] [place], [place] [address]   Patient continues to take lipid medication regularly. No obvious side effects from it. Generally does not miss a dose. Prior blood work results are reviewed with patient. Patient continues to work on fat intake in diet  Blood pressure medicine and blood pressure levels reviewed today with patient. Compliant with blood pressure medicine. States does not miss a dose. No obvious side effects. Blood pressure generally good when checked elsewhere. Watching salt  intake.   Patient claims compliance with diabetes medication. No obvious side effects. Reports no substantial low sugar spells. Most numbers are generally in good range when checked fasting. Generally does not miss a dose of medication. Watching diabetic diet closely  Unfortunately patient has recently been diagnosed with esophageal cancer.  Already chemotherapy has been initiated.  He is facing surgery.  Would either lose a segment of his esophagus or its entire esophagus.  Likely looking at a temporary feeding tube during that time  Patient continues to work full-time  Review of Systems No headache no chest pain no shortness of breath.    Objective:   Physical Exam  Alert.  Active no acute distress HEENT normal lungs no wheezes no crackles no tachypnea heart regular rate and rhythm ankles without edema.  Feet pulses good sensation intact      Assessment & Plan:  Impression 1 type 2 diabetes.  Control good.  A1c 7.2.  Was 8 point 9/2-year ago.  Now off insulin yet most fasting sugars still between 90 and 110.  Maintain oral meds hold insulin rationale discussed  2.  Hypertension.  Good control blood pressure excellent on repeat  3.  Hyperlipidemia.  Excellent control.  LDL below 70 which is important with known coronary artery disease.  4.  Coronary artery disease  Esophageal cancer manage by the specialist  Diet exercise discussed.  Medications refilled.  Follow-up in 6 months.  General questions answered.

## 2019-07-27 NOTE — Telephone Encounter (Signed)
24 hr Chemo F/U call : Spoke with pt who is doing well after receiving his first Taxol and Carboplatin infusions yesterday. Pt slept well last night and denies any pain,N+V,fever or chills at this time. Pt encouraged to call for any questions or concerns and verbalized undersatnding

## 2019-07-27 NOTE — Progress Notes (Signed)

## 2019-07-28 ENCOUNTER — Ambulatory Visit: Payer: BC Managed Care – PPO | Admitting: Urology

## 2019-07-28 ENCOUNTER — Other Ambulatory Visit: Payer: Self-pay

## 2019-07-28 ENCOUNTER — Encounter: Payer: Self-pay | Admitting: Urology

## 2019-07-28 VITALS — BP 119/70 | HR 76 | Temp 96.8°F | Ht 74.0 in | Wt 278.2 lb

## 2019-07-28 DIAGNOSIS — N486 Induration penis plastica: Secondary | ICD-10-CM

## 2019-07-28 LAB — POCT URINALYSIS DIPSTICK
Blood, UA: NEGATIVE
Glucose, UA: POSITIVE — AB
Leukocytes, UA: NEGATIVE
Nitrite, UA: NEGATIVE
Protein, UA: NEGATIVE
Spec Grav, UA: >=1.03 — AB (ref 1.010–1.025)
Urobilinogen, UA: NEGATIVE E.U./dL — AB
pH, UA: 5 (ref 5.0–8.0)

## 2019-07-28 MED ORDER — PENTOXIFYLLINE ER 400 MG PO TBCR: 400.0000 mg | EXTENDED_RELEASE_TABLET | Freq: Three times a day (TID) | ORAL | 1 refills | Status: DC

## 2019-07-28 NOTE — Progress Notes (Signed)
Urological Symptom Review  Patient is experiencing the following symptoms: Frequent urination Get up at night to urinate Erection problems (male only) Kidney stones  Review of Systems  Gastrointestinal (upper)  : Negative for upper GI symptoms  Gastrointestinal (lower) : Negative for lower GI symptoms  Constitutional : Negative for symptoms  Skin: Negative for skin symptoms  Eyes: Negative for eye symptoms  Ear/Nose/Throat : Negative for Ear/Nose/Throat symptoms  Hematologic/Lymphatic: Negative for Hematologic/Lymphatic symptoms  Cardiovascular : Leg swelling  Respiratory : Negative for respiratory symptoms  Endocrine: Negative for endocrine symptoms  Musculoskeletal: Negative for musculoskeletal symptoms  Neurological: Negative for neurological symptoms  Psychologic: Negative for psychiatric symptoms

## 2019-07-28 NOTE — Progress Notes (Signed)
07/28/2019 11:47 AM   Steven Crane 1960/11/05 [id number]  Referring provider: [name], MD [place],  [place] [address]  Penile curvature  HPI: [name] is a 59yo seen today for evaluation of Peyronie's disease. Starting 1 year ago he noted dorsal curvature. He has tried to manually bend his penis straight when he gets an erection. He has pain while straightening his erection. He does not have an issue getting or maintaining an erection. He has pain with intercourse. He has a soft area on his penile shaft at the area of curvature. He cannot palpate a knot. No narrowing.    PMH: Past Medical History:  Diagnosis Date  . Anxiety   . Arthritis    "hands, back, knees" (08/27/2013)  . Asthma   . CHF (congestive heart failure) ([place]) 02/2010  . Chronic bronchitis ([place])    "get it q year"  . Chronic lower back pain   . Closed head injury 1975  . Coronary artery disease May 2015   s/p successful PTCA/DES x 1 to distal RCA and PTCA/DES x 1 to OM-20 Aug 2013  . Depression   . DVT (deep venous thrombosis) ([place]) 2008   "LLE"  . Esophageal cancer ([place])   . GERD (gastroesophageal reflux disease)   . Headache    hs of but no longer has problems   . History of kidney stones   . Hyperlipemia   . Hypertension   . Hypertriglyceridemia   . LV dysfunction    LVEF 40% at time of cardiac cath May 2015  . Morbid obesity ([place])   . Myocardial infarction ([place]) 02/2010  . Psoriasis   . Type II diabetes mellitus ([place])     Surgical History: Past Surgical History:  Procedure Laterality Date  . BIOPSY  06/17/2019   Procedure: BIOPSY;  Surgeon: [name], MD;  Location: [facility];  Service: Endoscopy;;  . COLONOSCOPY  01/06/2012   Dr. [name]: suboptimal prep. normal exam. next colonoscopy in 10 years.   . CORONARY ANGIOPLASTY WITH STENT PLACEMENT  02/2010; 08/27/2013   "1 + 3"   . ESOPHAGOGASTRODUODENOSCOPY (EGD) WITH PROPOFOL N/A 06/17/2019   Procedure:  ESOPHAGOGASTRODUODENOSCOPY (EGD) WITH PROPOFOL;  Surgeon: [name], MD;  Location: [facility];  Service: Endoscopy;  Laterality: N/A;  11:15am  . KNEE ARTHROSCOPY Left 1981  . LEFT HEART CATHETERIZATION WITH CORONARY ANGIOGRAM N/A 08/27/2013   Procedure: LEFT HEART CATHETERIZATION WITH CORONARY ANGIOGRAM;  Surgeon: [name], MD;  Location: [facility] CATH LAB;  Service: Cardiovascular;  Laterality: N/A;  . right wrist fracture surgery     . TIBIA IM NAIL INSERTION Left 08/21/2018   Procedure: INTRAMEDULLARY (IM) NAIL TIBIAL;  Surgeon: Rod Can, MD;  Location: [facility] ORS;  Service: Orthopedics;  Laterality: Left;    Home Medications:  Allergies as of 07/28/2019      Reactions   Adhesive [tape] Rash      Medication List       Accurate as of July 28, 2019 11:47 AM. If you have any questions, ask your nurse or doctor.        acetaminophen 500 MG tablet Commonly known as: TYLENOL Take 1,000 mg by mouth in the morning and at bedtime.   albuterol 108 (90 Base) MCG/ACT inhaler Commonly known as: VENTOLIN HFA Inhale 2 puffs into the lungs every 6 (six) hours as needed for wheezing or shortness of breath.   albuterol 108 (90 Base) MCG/ACT inhaler  Commonly known as: VENTOLIN HFA INHALE 2 PUFFS BY MOUTH EVERY 6 HOURS AS NEEDED FOR WHEEZING FOR SHORTNESS OF BREATH   ALPRAZolam 0.5 MG tablet Commonly known as: XANAX TAKE ONE-HALF TO ONE TABLET BY MOUTH ONCE DAILY AS NEEDED   amLODipine 5 MG tablet Commonly known as: NORVASC Take 1 tablet (5 mg total) by mouth daily.   aspirin EC 81 MG tablet Take 1 tablet (81 mg total) by mouth daily.   atorvastatin 40 MG tablet Commonly known as: LIPITOR Take 1 tablet by mouth once daily What changed: when to take this   blood glucose meter kit and supplies Test glucose once daily. ICD 10 E11.9   CARBOPLATIN IV Inject into the vein once a week.   Dexilant 60 MG capsule Generic drug: dexlansoprazole Take 1 capsule (60 mg  total) by mouth daily as needed (reflux).   enalapril 20 MG tablet Commonly known as: VASOTEC Take 1 tablet (20 mg total) by mouth 2 (two) times daily.   fenofibrate 160 MG tablet Take 1 tablet (160 mg total) by mouth daily.   glyBURIDE-metformin 5-500 MG tablet Commonly known as: GLUCOVANCE Take 2 tablets by mouth 2 (two) times daily.   lidocaine-prilocaine cream Commonly known as: EMLA Apply a small amount to port a cath site and cover with plastic wrap one hour prior to infusion appointments   metoprolol succinate 50 MG 24 hr tablet Commonly known as: TOPROL-XL TAKE 1 TABLET BY MOUTH ONCE DAILY WITH OR IMMEDIATELY FOLLOWING A MEAL   PACLITAXEL IV Inject 50 mg/m2 into the vein once a week.   prochlorperazine 10 MG tablet Commonly known as: COMPAZINE Take 1 tablet (10 mg total) by mouth every 6 (six) hours as needed for nausea or vomiting.       Allergies:  Allergies  Allergen Reactions  . Adhesive [Tape] Rash    Family History: Family History  Problem Relation Age of Onset  . Coronary artery disease Father   . Diabetes type II Father   . Diabetes Father   . Heart attack Father   . Hypertension Mother     Social History:  reports that he has never smoked. His smokeless tobacco use includes snuff and chew. He reports current alcohol use. He reports that he does not use drugs.  ROS: All other review of systems were reviewed and are negative except what is noted above in HPI  Physical Exam: BP 119/70   Pulse 76   Temp (!) 96.8 F (36 C)   Ht 6' 2" (1.88 m)   Wt 278 lb 3.2 oz (126.2 kg)   BMI 35.72 kg/m   Constitutional:  Alert and oriented, No acute distress. HEENT: [place] AT, moist mucus membranes.  Trachea midline, no masses. Cardiovascular: No clubbing, cyanosis, or edema. Respiratory: Normal respiratory effort, no increased work of breathing. GI: Abdomen is soft, nontender, nondistended, no abdominal masses GU: No CVA tenderness. Circumcised phallus. No  masses/lesions on penis, testis, scrotum. Dorsal peyronies plaque. Prostate 40g smooth no nodules no induration.  Lymph: No cervical or inguinal lymphadenopathy. Skin: No rashes, bruises or suspicious lesions. Neurologic: Grossly intact, no focal deficits, moving all 4 extremities. Psychiatric: Normal mood and affect.  Laboratory Data: Lab Results  Component Value Date   WBC 6.1 07/26/2019   HGB 12.9 (L) 07/26/2019   HCT 39.9 07/26/2019   MCV 86.6 07/26/2019   PLT 297 07/26/2019    Lab Results  Component Value Date   CREATININE 0.75 07/26/2019    Lab Results  Component Value Date   PSA 0.24 01/13/2014   PSA 0.24 05/24/2013    No results found for: TESTOSTERONE  Lab Results  Component Value Date   HGBA1C 7.3 (H) 07/22/2019    Urinalysis    Component Value Date/Time   COLORURINE YELLOW 04/04/2013 1232   APPEARANCEUR CLEAR 04/04/2013 1232   LABSPEC 1.020 04/04/2013 1232   PHURINE 5.5 04/04/2013 1232   GLUCOSEU >1000 (A) 04/04/2013 1232   HGBUR LARGE (A) 04/04/2013 1232   BILIRUBINUR small 07/28/2019 1137   KETONESUR NEGATIVE 04/04/2013 1232   PROTEINUR Negative 07/28/2019 1137   PROTEINUR NEGATIVE 04/04/2013 1232   UROBILINOGEN negative (A) 07/28/2019 1137   UROBILINOGEN 0.2 04/04/2013 1232   NITRITE neg 07/28/2019 1137   NITRITE NEGATIVE 04/04/2013 1232   LEUKOCYTESUR Negative 07/28/2019 1137    Lab Results  Component Value Date   LABMICR 7.1 07/22/2019    Pertinent Imaging:  No results found for this or any previous visit. No results found for this or any previous visit. No results found for this or any previous visit. No results found for this or any previous visit. No results found for this or any previous visit. No results found for this or any previous visit. No results found for this or any previous visit. No results found for this or any previous visit.  Assessment & Plan:    1. Peyronie's disease -We discussed the natural hx of peyronies  disease and the various treatment options including medical therapy, penile plication, verapamil therapy, and xiaflex therapy.  We will start trental 477m TID and he will return with pictures of his erections - POCT urinalysis dipstick   No follow-ups on file.  PNicolette Bang MD  [facility]

## 2019-07-28 NOTE — Patient Instructions (Signed)
Collagenase injection (Dupuytren's Contracture/Peyronie's Disease) What is this medicine? COLLAGENASE (kohl LAH jen ace) is used to treat Dupuytren's contracture. This medicine may help straighten a bent finger by breaking up hard tissue. It is also used for Peyronie's disease by breaking up the hard tissue plaque that causes the curvature in the penis. This medicine may be used for other purposes; ask your health care provider or pharmacist if you have questions. COMMON BRAND NAME(S): Xiaflex What should I tell my health care provider before I take this medicine? They need to know if you have any of these conditions:  hemophilia  low platelet counts  take medicines that treat or prevent blood clots  an unusual or allergic reaction to collagenase, other medicines, foods, dyes, or preservatives  pregnant or trying to get pregnant  breast-feeding How should I use this medicine? This medicine is for injection into the hand or penis. It is given by a health care professional in a hospital or clinic setting. A special MedGuide will be given to you by the pharmacist with each prescription and refill. Be sure to read this information carefully each time. Talk to your pediatrician regarding the use of this medicine in children. Special care may be needed. Overdosage: If you think you have taken too much of this medicine contact a poison control center or emergency room at once. NOTE: This medicine is only for you. Do not share this medicine with others. What if I miss a dose? It is important not to miss your dose. Call your doctor or health care professional if you are unable to keep an appointment. What may interact with this medicine?  aspirin and aspirin-like medicines  certain medicines that treat or prevent blood clots like warfarin, enoxaparin, and dalteparin This list may not describe all possible interactions. Give your health care provider a list of all the medicines, herbs,  non-prescription drugs, or dietary supplements you use. Also tell them if you smoke, drink alcohol, or use illegal drugs. Some items may interact with your medicine. What should I watch for while using this medicine? Your condition will be monitored carefully while you are receiving this medicine. If being treated for Dupuytren's contracture, return to your healthcare provider the day after your hand is injected. In the meantime, do not flex or extend the fingers of your hand that was injected. Do not touch your finger that was injected, and elevate your hand until bedtime. Do not perform activity with the injected hand until you are told that it is OK. Follow any instructions about wearing a splint or performing finger exercises. Also, call your healthcare provider if you get increasing redness or swelling in the hand, if you have numbness or tingling in the treated finger, or if you have trouble bending the finger after the swelling goes down. If being treated for Peyronie's disease, you will need to return to your healthcare provider for a manual procedure that will stretch and help straighten your penis. Also, your healthcare provider will show you how to gently stretch your penis at home. Do not resume sexual activity until you are told that it is okay. Follow instructions on when to return for follow-up visits. Immediately call your doctor if you have trouble stretching or straightening your penis, or if you have pain or other concerns. Immediately call your healthcare provider if you get a fever or chills. What side effects may I notice from receiving this medicine? Side effects that you should report to your doctor or health   care professional as soon as possible:  allergic reactions like skin rash, itching or hives, swelling of the face, lips, or tongue  breathing problems  chest pain or palpitations  pain in your penis  pain when urinating  red or dark-brown urine  sudden loss of the  ability to maintain an erection  swelling of the injected hand  unusual swelling or bruising of the penis Side effects that usually do not require medical attention (report to your doctor or health care professional if they continue or are bothersome):  irritation at site where injected  pain at site where injected  unusual bleeding or bruising This list may not describe all possible side effects. Call your doctor for medical advice about side effects. You may report side effects to FDA at 1-800-FDA-1088. Where should I keep my medicine? This drug is given in a hospital or clinic and will not be stored at home. NOTE: This sheet is a summary. It may not cover all possible information. If you have questions about this medicine, talk to your doctor, pharmacist, or health care provider.  2020 Elsevier/Gold Standard (2015-05-11 09:34:13)  

## 2019-07-29 ENCOUNTER — Ambulatory Visit (HOSPITAL_COMMUNITY): Admission: RE | Admit: 2019-07-29 | Payer: BC Managed Care – PPO | Source: Ambulatory Visit

## 2019-07-30 ENCOUNTER — Other Ambulatory Visit: Payer: Self-pay

## 2019-07-30 ENCOUNTER — Telehealth (INDEPENDENT_AMBULATORY_CARE_PROVIDER_SITE_OTHER): Payer: BC Managed Care – PPO | Admitting: Thoracic Surgery (Cardiothoracic Vascular Surgery)

## 2019-07-30 DIAGNOSIS — C160 Malignant neoplasm of cardia: Secondary | ICD-10-CM

## 2019-07-30 NOTE — Progress Notes (Signed)
     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  TxNxM0 Dx date: 05/28/19 Dx Weight: 285 Chemo start Date: 07/27/19  Radiation Start Date: pending  Current Weight: 278 Plan:  Having difficulty with protein shakes due to constipation.  I recommended that he supplements his diet with Metamucil and MiraLAX.  He is able to tolerate a soft mechanical diet.  We will check up with him in 2 weeks for another weight check and determine the accurate start date for his radiation.

## 2019-08-02 ENCOUNTER — Other Ambulatory Visit: Payer: Self-pay | Admitting: Student

## 2019-08-02 ENCOUNTER — Other Ambulatory Visit (HOSPITAL_COMMUNITY): Payer: BC Managed Care – PPO

## 2019-08-02 ENCOUNTER — Other Ambulatory Visit: Payer: Self-pay

## 2019-08-02 ENCOUNTER — Inpatient Hospital Stay (HOSPITAL_COMMUNITY): Payer: BC Managed Care – PPO

## 2019-08-02 ENCOUNTER — Inpatient Hospital Stay (HOSPITAL_BASED_OUTPATIENT_CLINIC_OR_DEPARTMENT_OTHER): Payer: BC Managed Care – PPO | Admitting: Hematology

## 2019-08-02 VITALS — BP 120/69 | HR 73 | Temp 98.0°F | Resp 18

## 2019-08-02 DIAGNOSIS — C160 Malignant neoplasm of cardia: Secondary | ICD-10-CM

## 2019-08-02 LAB — CBC WITH DIFFERENTIAL/PLATELET
Abs Immature Granulocytes: 0.02 10*3/uL (ref 0.00–0.07)
Basophils Absolute: 0 10*3/uL (ref 0.0–0.1)
Basophils Relative: 1 %
Eosinophils Absolute: 0.1 10*3/uL (ref 0.0–0.5)
Eosinophils Relative: 2 %
HCT: 38.2 % — ABNORMAL LOW (ref 39.0–52.0)
Hemoglobin: 12.5 g/dL — ABNORMAL LOW (ref 13.0–17.0)
Immature Granulocytes: 1 %
Lymphocytes Relative: 15 %
Lymphs Abs: 0.5 10*3/uL — ABNORMAL LOW (ref 0.7–4.0)
MCH: 28.4 pg (ref 26.0–34.0)
MCHC: 32.7 g/dL (ref 30.0–36.0)
MCV: 86.8 fL (ref 80.0–100.0)
Monocytes Absolute: 0.3 10*3/uL (ref 0.1–1.0)
Monocytes Relative: 8 %
Neutro Abs: 2.5 10*3/uL (ref 1.7–7.7)
Neutrophils Relative %: 73 %
Platelets: 237 10*3/uL (ref 150–400)
RBC: 4.4 MIL/uL (ref 4.22–5.81)
RDW: 13.6 % (ref 11.5–15.5)
WBC: 3.3 10*3/uL — ABNORMAL LOW (ref 4.0–10.5)
nRBC: 0 % (ref 0.0–0.2)

## 2019-08-02 LAB — COMPREHENSIVE METABOLIC PANEL
ALT: 29 U/L (ref 0–44)
AST: 21 U/L (ref 15–41)
Albumin: 3.8 g/dL (ref 3.5–5.0)
Alkaline Phosphatase: 40 U/L (ref 38–126)
Anion gap: 9 (ref 5–15)
BUN: 14 mg/dL (ref 6–20)
CO2: 23 mmol/L (ref 22–32)
Calcium: 8.9 mg/dL (ref 8.9–10.3)
Chloride: 102 mmol/L (ref 98–111)
Creatinine, Ser: 0.65 mg/dL (ref 0.61–1.24)
GFR calc Af Amer: >60 mL/min (ref >60)
GFR calc non Af Amer: >60 mL/min (ref >60)
Glucose, Bld: 158 mg/dL — ABNORMAL HIGH (ref 70–99)
Potassium: 4.6 mmol/L (ref 3.5–5.1)
Sodium: 134 mmol/L — ABNORMAL LOW (ref 135–145)
Total Bilirubin: 0.7 mg/dL (ref 0.3–1.2)
Total Protein: 6.9 g/dL (ref 6.5–8.1)

## 2019-08-02 LAB — MAGNESIUM: Magnesium: 1.9 mg/dL (ref 1.7–2.4)

## 2019-08-02 MED ORDER — SODIUM CHLORIDE 0.9 % IV SOLN
20.0000 mg | Freq: Once | INTRAVENOUS | Status: AC
  Administered 2019-08-02: 20 mg via INTRAVENOUS
  Filled 2019-08-02: qty 20

## 2019-08-02 MED ORDER — FAMOTIDINE IN NACL 20-0.9 MG/50ML-% IV SOLN
20.0000 mg | Freq: Once | INTRAVENOUS | Status: AC
  Administered 2019-08-02: 20 mg via INTRAVENOUS
  Filled 2019-08-02: qty 50

## 2019-08-02 MED ORDER — PALONOSETRON HCL INJECTION 0.25 MG/5ML
0.2500 mg | Freq: Once | INTRAVENOUS | Status: AC
  Administered 2019-08-02: 0.25 mg via INTRAVENOUS
  Filled 2019-08-02: qty 5

## 2019-08-02 MED ORDER — SODIUM CHLORIDE 0.9 % IV SOLN
50.0000 mg/m2 | Freq: Once | INTRAVENOUS | Status: AC
  Administered 2019-08-02: 132 mg via INTRAVENOUS
  Filled 2019-08-02: qty 22

## 2019-08-02 MED ORDER — SODIUM CHLORIDE 0.9 % IV SOLN
300.0000 mg | Freq: Once | INTRAVENOUS | Status: AC
  Administered 2019-08-02: 13:00:00 300 mg via INTRAVENOUS
  Filled 2019-08-02: qty 30

## 2019-08-02 MED ORDER — SODIUM CHLORIDE 0.9 % IV SOLN: Freq: Once | INTRAVENOUS | Status: AC

## 2019-08-02 MED ORDER — DIPHENHYDRAMINE HCL 50 MG/ML IJ SOLN
50.0000 mg | Freq: Once | INTRAMUSCULAR | Status: AC
  Administered 2019-08-02: 50 mg via INTRAVENOUS
  Filled 2019-08-02: qty 1

## 2019-08-02 NOTE — Patient Instructions (Addendum)
[place] at [facility] Discharge Instructions  You were seen today by Dr. Delton Coombes. He went over your recent results. Please try to drink at least 3 Ensure a day. He will see you back in 1 week for labs, treatment and follow up.   Thank you for choosing [place] at [facility] to provide your oncology and hematology care.  To afford each patient quality time with our provider, please arrive at least 15 minutes before your scheduled appointment time.   If you have a lab appointment with the [facility] please come in thru the  Main Entrance and check in at the main information desk  You need to re-schedule your appointment should you arrive 10 or more minutes late.  We strive to give you quality time with our providers, and arriving late affects you and other patients whose appointments are after yours.  Also, if you no show three or more times for appointments you may be dismissed from the clinic at the providers discretion.     Again, thank you for choosing [facility].  Our hope is that these requests will decrease the amount of time that you wait before being seen by our physicians.       _____________________________________________________________  Should you have questions after your visit to [facility], please contact our office at [phone] between the hours of 8:00 a.m. and 4:30 p.m.  Voicemails left after 4:00 p.m. will not be returned until the following business day.  For prescription refill requests, have your pharmacy contact our office and allow 72 hours.    Cancer Center Support Programs:   > Cancer Support Group  2nd Tuesday of the month 1pm-2pm, Journey Room

## 2019-08-02 NOTE — Progress Notes (Signed)
Steven Crane, [place] [address]   CLINIC:  Medical Oncology/Hematology  PCP:  [name], [place] [address] [phone]   REASON FOR VISIT:  Follow-up for GE junction adenocarcinoma.  CURRENT THERAPY: Chemoradiation therapy.    INTERVAL HISTORY:  Steven Crane 59 y.o. male seen for follow-up of GE junction adenocarcinoma.  First cycle of chemotherapy was on 07/26/2019.  Did not experience any major nausea or vomiting.  No diarrhea reported.  He has constipation which gets worse by drinking protein shakes.  Appetite and energy levels are 75%.  Lost about 3 pounds since last week.  He is taking stool softeners and MiraLAX.  Denies any tingling or numbness in extremities.    REVIEW OF SYSTEMS:  Review of Systems  Gastrointestinal: Positive for constipation.  All other systems reviewed and are negative.    PAST MEDICAL/SURGICAL HISTORY:  Past Medical History:  Diagnosis Date  . Anxiety   . Arthritis    "hands, back, knees" (08/27/2013)  . Asthma   . CHF (congestive heart failure) ([place]) 02/2010  . Chronic bronchitis ([place])    "get it q year"  . Chronic lower back pain   . Closed head injury 1975  . Coronary artery disease May 2015   s/p successful PTCA/DES x 1 to distal RCA and PTCA/DES x 1 to OM-20 Aug 2013  . Depression   . DVT (deep venous thrombosis) ([place]) 2008   "LLE"  . Esophageal cancer ([place])   . GERD (gastroesophageal reflux disease)   . Headache    hs of but no longer has problems   . History of kidney stones   . Hyperlipemia   . Hypertension   . Hypertriglyceridemia   . LV dysfunction    LVEF 40% at time of cardiac cath May 2015  . Morbid obesity ([place])   . Myocardial infarction ([place]) 02/2010  . Psoriasis   . Type II diabetes mellitus ([place])    Past Surgical History:  Procedure Laterality Date  . BIOPSY  06/17/2019   Procedure: BIOPSY;  Surgeon: [name], MD;  Location: [facility];  Service: Endoscopy;;  . COLONOSCOPY  01/06/2012   Dr. [name]: suboptimal prep. normal exam. next colonoscopy in 10 years.   . CORONARY ANGIOPLASTY WITH STENT PLACEMENT  02/2010; 08/27/2013   "1 + 3"   . ESOPHAGOGASTRODUODENOSCOPY (EGD) WITH PROPOFOL N/A 06/17/2019   Procedure: ESOPHAGOGASTRODUODENOSCOPY (EGD) WITH PROPOFOL;  Surgeon: [name], MD;  Location: [facility] SU[facility];  Service: Endoscopy;  Laterality: N/A;  11:15am  . KNEE ARTHROSCOPY Left 1981  . LEFT HEART CATHETERIZATION WITH CORONARY ANGIOGRAM N/A 08/27/2013   Procedure: LEFT HEART CATHETERIZATION WITH CORONARY ANGIOGRAM;  Surgeon: [name], MD;  Location: [facility] CATH LAB;  Service: Cardiovascular;  Laterality: N/A;  . right wrist fracture surgery     . TIBIA IM NAIL INSERTION Left 08/21/2018   Procedure: INTRAMEDULLARY (IM) NAIL TIBIAL;  Surgeon: Rod Can, MD;  Location: [facility] ORS;  Service: Orthopedics;  Laterality: Left;     SOCIAL HISTORY:  Social History   Socioeconomic History  . Marital status: Single    Spouse name: Not on file  . Number of children: 3  . Years of education: Not on file  . Highest education level: Not on file  Occupational History  . Occupation: [employer]  Tobacco Use  . Smoking status: Never Smoker  . Smokeless tobacco: Current User  Types: Snuff, Chew  . Tobacco comment: "started dipping @ age 41; quit for 5 1/2 yrs at one time; hadn't chewed in awhile"  Substance and Sexual Activity  . Alcohol use: Yes    Alcohol/week: 0.0 standard drinks    Comment: occ   . Drug use: No  . Sexual activity: Yes  Other Topics Concern  . Not on file  Social History Narrative  . Not on file   Social Determinants of Health   Financial Resource Strain: Low Risk   . Difficulty of Paying Living Expenses: Not hard at all  Food Insecurity: No Food Insecurity  . Worried About Charity fundraiser in the Last Year: Never true  . Ran Out of Food in the Last Year: Never true  Transportation  Needs: No Transportation Needs  . Lack of Transportation (Medical): No  . Lack of Transportation (Non-Medical): No  Physical Activity: Insufficiently Active  . Days of Exercise per Week: 1 day  . Minutes of Exercise per Session: 20 min  Stress: No Stress Concern Present  . Feeling of Stress : Not at all  Social Connections: Slightly Isolated  . Frequency of Communication with Friends and Family: Three times a week  . Frequency of Social Gatherings with Friends and Family: Three times a week  . Attends Religious Services: 1 to 4 times per year  . Active Member of Clubs or Organizations: No  . Attends Archivist Meetings: Never  . Marital Status: Living with partner  Intimate Partner Violence: Not At Risk  . Fear of Current or Ex-Partner: No  . Emotionally Abused: No  . Physically Abused: No  . Sexually Abused: No    FAMILY HISTORY:  Family History  Problem Relation Age of Onset  . Coronary artery disease Father   . Diabetes type II Father   . Diabetes Father   . Heart attack Father   . Hypertension Mother     CURRENT MEDICATIONS:  Outpatient Encounter Medications as of 08/02/2019  Medication Sig Note  . acetaminophen (TYLENOL) 500 MG tablet Take 1,000 mg by mouth in the morning and at bedtime.    Marland Kitchen amLODipine (NORVASC) 5 MG tablet Take 1 tablet (5 mg total) by mouth daily.   Marland Kitchen aspirin EC 81 MG tablet Take 1 tablet (81 mg total) by mouth daily.   Marland Kitchen atorvastatin (LIPITOR) 40 MG tablet Take 1 tablet by mouth once daily (Patient taking differently: Take 40 mg by mouth at bedtime. )   . blood glucose meter kit and supplies Test glucose once daily. ICD 10 E11.9   . enalapril (VASOTEC) 20 MG tablet Take 1 tablet (20 mg total) by mouth 2 (two) times daily.   . fenofibrate 160 MG tablet Take 1 tablet (160 mg total) by mouth daily.   Marland Kitchen glyBURIDE-metformin (GLUCOVANCE) 5-500 MG tablet Take 2 tablets by mouth 2 (two) times daily.   . metoprolol succinate (TOPROL-XL) 50 MG 24 hr  tablet TAKE 1 TABLET BY MOUTH ONCE DAILY WITH OR IMMEDIATELY FOLLOWING A MEAL   . PACLITAXEL IV Inject 50 mg/m2 into the vein once a week.    . pentoxifylline (TRENTAL) 400 MG CR tablet Take 1 tablet (400 mg total) by mouth 3 (three) times daily with meals.   Marland Kitchen albuterol (PROVENTIL HFA;VENTOLIN HFA) 108 (90 Base) MCG/ACT inhaler Inhale 2 puffs into the lungs every 6 (six) hours as needed for wheezing or shortness of breath. (Patient not taking: Reported on 08/02/2019)   . albuterol (VENTOLIN  HFA) 108 (90 Base) MCG/ACT inhaler INHALE 2 PUFFS BY MOUTH EVERY 6 HOURS AS NEEDED FOR WHEEZING FOR SHORTNESS OF BREATH (Patient not taking: Reported on 08/02/2019)   . ALPRAZolam (XANAX) 0.5 MG tablet TAKE ONE-HALF TO ONE TABLET BY MOUTH ONCE DAILY AS NEEDED (Patient not taking: Reported on 08/02/2019)   . dexlansoprazole (DEXILANT) 60 MG capsule Take 1 capsule (60 mg total) by mouth daily as needed (reflux). (Patient not taking: Reported on 08/02/2019)   . lidocaine-prilocaine (EMLA) cream Apply a small amount to port a cath site and cover with plastic wrap one hour prior to infusion appointments (Patient not taking: Reported on 08/02/2019) 07/23/2019: Have not started yet  . prochlorperazine (COMPAZINE) 10 MG tablet Take 1 tablet (10 mg total) by mouth every 6 (six) hours as needed for nausea or vomiting. (Patient not taking: Reported on 08/02/2019) 07/23/2019: Have not started yet   No facility-administered encounter medications on file as of 08/02/2019.    ALLERGIES:  Allergies  Allergen Reactions  . Adhesive [Tape] Rash     PHYSICAL EXAM:  ECOG Performance status: 1  Vitals:   08/02/19 1006  BP: 124/63  Pulse: 74  Resp: 18  Temp: (!) 96.9 F (36.1 C)  SpO2: 99%   Filed Weights   08/02/19 1006  Weight: 275 lb 3.2 oz (124.8 kg)    Physical Exam Vitals reviewed.  Constitutional:      Appearance: Normal appearance.  Cardiovascular:     Rate and Rhythm: Normal rate and regular rhythm.     Heart  sounds: Normal heart sounds.  Pulmonary:     Effort: Pulmonary effort is normal.     Breath sounds: Normal breath sounds.  Abdominal:     General: There is no distension.     Palpations: Abdomen is soft. There is no mass.  Skin:    General: Skin is warm.  Neurological:     General: No focal deficit present.     Mental Status: He is alert and oriented to person, place, and time.  Psychiatric:        Mood and Affect: Mood normal.        Behavior: Behavior normal.      LABORATORY DATA:  I have reviewed the labs as listed.  CBC    Component Value Date/Time   WBC 3.3 (L) 08/02/2019 0945   RBC 4.40 08/02/2019 0945   HGB 12.5 (L) 08/02/2019 0945   HCT 38.2 (L) 08/02/2019 0945   PLT 237 08/02/2019 0945   MCV 86.8 08/02/2019 0945   MCH 28.4 08/02/2019 0945   MCHC 32.7 08/02/2019 0945   RDW 13.6 08/02/2019 0945   LYMPHSABS 0.5 (L) 08/02/2019 0945   MONOABS 0.3 08/02/2019 0945   EOSABS 0.1 08/02/2019 0945   BASOSABS 0.0 08/02/2019 0945   CMP Latest Ref Rng & Units 08/02/2019 07/26/2019 07/22/2019  Glucose 70 - 99 mg/dL 158(H) 243(H) 134(H)  BUN 6 - 20 mg/dL '14 11 11  ' Creatinine 0.61 - 1.24 mg/dL 0.65 0.75 0.69(L)  Sodium 135 - 145 mmol/L 134(L) 135 141  Potassium 3.5 - 5.1 mmol/L 4.6 4.7 5.2  Chloride 98 - 111 mmol/L 102 102 105  CO2 22 - 32 mmol/L '23 25 21  ' Calcium 8.9 - 10.3 mg/dL 8.9 8.8(L) 9.3  Total Protein 6.5 - 8.1 g/dL 6.9 6.7 6.8  Total Bilirubin 0.3 - 1.2 mg/dL 0.7 0.6 0.2  Alkaline Phos 38 - 126 U/L 40 38 49  AST 15 - 41 U/L 21 19 14  ALT 0 - 44 U/L '29 25 22       ' DIAGNOSTIC IMAGING:  I have reviewed the scans.     ASSESSMENT & PLAN:   Primary adenocarcinoma of gastroesophageal junction (HCC) 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 -EGD on 05/28/2019 shows distal esophageal mass extending from 33-38 cm (GE junction) with an additional 3 cm competent involving the proximal cardia of the  stomach. -PET scan on 07/20/2019 shows uptake in the mass.  Gastrohepatic lymph nodes are not hypermetabolic.  No metastatic disease. -He was evaluated by Dr. Kipp Brood.  He is a potential resection candidate after chemoradiation. -Week 1 of chemoradiation therapy on 07/26/2019. -He did not have any major side effects with chemotherapy.  We reviewed his labs.  White count is 3.3 with 73% neutrophils.  Platelet count is 237.  LFTs are normal. -He will proceed with week 2 of treatment today.  2.  Weight loss/dysphagia: -He lost 3 pounds in the last 1 week. -He is drinking 3 Premier protein shakes per day.  Anything above will cause serious constipation.  He is also eating soft foods. -We have given samples of boost 350 cal and asked him to try it.  3.  Constipation: -He is using stool softeners.  He is also using MiraLAX twice daily. -He will start eating prunes.  We will consider lactulose if no improvement.  4.  Diabetes: -He will continue Glucovance 2 tablets twice daily and Levemir 30 units twice daily.  5.  Family history: -Maternal aunt and 2 maternal uncles died of cancer.  One paternal aunt died of lung cancer.  Multiple maternal cousins died of cancers.  Patient does not know exact types.  He will benefit from genetic evaluation and testing.      Orders placed this encounter:  No orders of the defined types were placed in this encounter.    Derek Jack, MD [place] [phone]

## 2019-08-02 NOTE — Progress Notes (Signed)
Patient has been assessed, vital signs and labs have been reviewed by Dr. Katragadda. ANC, Creatinine, LFTs, and Platelets are within treatment parameters per Dr. Katragadda. The patient is good to proceed with treatment at this time.  

## 2019-08-02 NOTE — Assessment & Plan Note (Signed)
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 -EGD on 05/28/2019 shows distal esophageal mass extending from 33-38 cm (GE junction) with an additional 3 cm competent involving the proximal cardia of the stomach. -PET scan on 07/20/2019 shows uptake in the mass.  Gastrohepatic lymph nodes are not hypermetabolic.  No metastatic disease. -He was evaluated by Dr. Kipp Brood.  He is a potential resection candidate after chemoradiation. -Week 1 of chemoradiation therapy on 07/26/2019. -He did not have any major side effects with chemotherapy.  We reviewed his labs.  White count is 3.3 with 73% neutrophils.  Platelet count is 237.  LFTs are normal. -He will proceed with week 2 of treatment today.  2.  Weight loss/dysphagia: -He lost 3 pounds in the last 1 week. -He is drinking 3 Premier protein shakes per day.  Anything above will cause serious constipation.  He is also eating soft foods. -We have given samples of boost 350 cal and asked him to try it.  3.  Constipation: -He is using stool softeners.  He is also using MiraLAX twice daily. -He will start eating prunes.  We will consider lactulose if no improvement.  4.  Diabetes: -He will continue Glucovance 2 tablets twice daily and Levemir 30 units twice daily.  5.  Family history: -Maternal aunt and 2 maternal uncles died of cancer.  One paternal aunt died of lung cancer.  Multiple maternal cousins died of cancers.  Patient does not know exact types.  He will benefit from genetic evaluation and testing.

## 2019-08-02 NOTE — Progress Notes (Signed)
1030 Labs reviewed with and pt seen by Dr. Delton Coombes and pt approved for Taxol and Carboplatin infusions today per MD           Steven Crane tolerated chemo tx well without complaints or incident. Peripheral IV site checked by 2 RN's prior to each chemo infusion with positive blood return noted. VSS upon discharge. Pt discharged self ambulatory in satisfactory condition

## 2019-08-02 NOTE — Patient Instructions (Signed)
[facility] Discharge Instructions for Patients Receiving Chemotherapy   Beginning January 23rd 2017 lab work for the Cancer Center will be done in the  Main lab at [facility] on 1st floor. If you have a lab appointment with the Cancer Center please come in thru the  Main Entrance and check in at the main information desk   Today you received the following chemotherapy agents Taxol and Carboplatin. Follow-up as scheduled. Call clinic for any questions or concerns  To help prevent nausea and vomiting after your treatment, we encourage you to take your nausea medication.   If you develop nausea and vomiting, or diarrhea that is not controlled by your medication, call the clinic.  The clinic phone number is [phone]. Office hours are Monday-Friday 8:30am-5:00pm.  BELOW ARE SYMPTOMS THAT SHOULD BE REPORTED IMMEDIATELY:  *FEVER GREATER THAN 101.0 F  *CHILLS WITH OR WITHOUT FEVER  NAUSEA AND VOMITING THAT IS NOT CONTROLLED WITH YOUR NAUSEA MEDICATION  *UNUSUAL SHORTNESS OF BREATH  *UNUSUAL BRUISING OR BLEEDING  TENDERNESS IN MOUTH AND THROAT WITH OR WITHOUT PRESENCE OF ULCERS  *URINARY PROBLEMS  *BOWEL PROBLEMS  UNUSUAL RASH Items with * indicate a potential emergency and should be followed up as soon as possible. If you have an emergency after office hours please contact your primary care physician or go to the nearest emergency department.  Please call the clinic during office hours if you have any questions or concerns.   You may also contact the Patient Navigator at [phone] should you have any questions or need assistance in obtaining follow up care.      Resources For Cancer Patients and their Caregivers ? American Cancer Society: Can assist with transportation, wigs, general needs, runs Look Good Feel Better.        1-888-227-6333 ? Cancer Care: Provides financial assistance, online support groups, medication/co-pay assistance.   1-800-813-HOPE (4673) ? [facility] Assists [place] cancer patients and their families through emotional , educational and financial support.  336-427-4357 ? [place] DSS Where to apply for food stamps, Medicaid and utility assistance. 336-342-1394 ? [facility]: Transportation to medical appointments. 336-347-2287 ? Social Security Administration: May apply for disability if have a Stage IV cancer. 336-342-7796 1-800-772-1213 ? [place] Aging, Disability and Transit Services: Assists with nutrition, care and transit needs. 336-349-2343         

## 2019-08-03 ENCOUNTER — Ambulatory Visit (HOSPITAL_COMMUNITY)
Admission: RE | Admit: 2019-08-03 | Discharge: 2019-08-03 | Disposition: A | Discharge status: Home / Self Care | Payer: BC Managed Care – PPO | Source: Ambulatory Visit | Attending: Hematology | Admitting: Hematology

## 2019-08-03 ENCOUNTER — Other Ambulatory Visit: Payer: Self-pay

## 2019-08-03 DIAGNOSIS — Z86718 Personal history of other venous thrombosis and embolism: Secondary | ICD-10-CM | POA: Diagnosis not present

## 2019-08-03 DIAGNOSIS — J45909 Unspecified asthma, uncomplicated: Secondary | ICD-10-CM | POA: Insufficient documentation

## 2019-08-03 DIAGNOSIS — E785 Hyperlipidemia, unspecified: Secondary | ICD-10-CM | POA: Insufficient documentation

## 2019-08-03 DIAGNOSIS — F17290 Nicotine dependence, other tobacco product, uncomplicated: Secondary | ICD-10-CM | POA: Insufficient documentation

## 2019-08-03 DIAGNOSIS — I110 Hypertensive heart disease with heart failure: Secondary | ICD-10-CM | POA: Insufficient documentation

## 2019-08-03 DIAGNOSIS — F329 Major depressive disorder, single episode, unspecified: Secondary | ICD-10-CM | POA: Insufficient documentation

## 2019-08-03 DIAGNOSIS — Z7982 Long term (current) use of aspirin: Secondary | ICD-10-CM | POA: Insufficient documentation

## 2019-08-03 DIAGNOSIS — C160 Malignant neoplasm of cardia: Secondary | ICD-10-CM | POA: Insufficient documentation

## 2019-08-03 DIAGNOSIS — F419 Anxiety disorder, unspecified: Secondary | ICD-10-CM | POA: Diagnosis not present

## 2019-08-03 DIAGNOSIS — I509 Heart failure, unspecified: Secondary | ICD-10-CM | POA: Diagnosis not present

## 2019-08-03 DIAGNOSIS — I252 Old myocardial infarction: Secondary | ICD-10-CM | POA: Insufficient documentation

## 2019-08-03 DIAGNOSIS — I2510 Atherosclerotic heart disease of native coronary artery without angina pectoris: Secondary | ICD-10-CM | POA: Diagnosis not present

## 2019-08-03 DIAGNOSIS — Z79899 Other long term (current) drug therapy: Secondary | ICD-10-CM | POA: Insufficient documentation

## 2019-08-03 DIAGNOSIS — E119 Type 2 diabetes mellitus without complications: Secondary | ICD-10-CM | POA: Insufficient documentation

## 2019-08-03 DIAGNOSIS — K219 Gastro-esophageal reflux disease without esophagitis: Secondary | ICD-10-CM | POA: Insufficient documentation

## 2019-08-03 DIAGNOSIS — Z6835 Body mass index (BMI) 35.0-35.9, adult: Secondary | ICD-10-CM | POA: Insufficient documentation

## 2019-08-03 HISTORY — PX: IR IMAGING GUIDED PORT INSERTION: IMG5740

## 2019-08-03 LAB — GLUCOSE, CAPILLARY: Glucose-Capillary: 168 mg/dL — ABNORMAL HIGH (ref 70–99)

## 2019-08-03 MED ORDER — LIDOCAINE HCL (PF) 1 % IJ SOLN
INTRAMUSCULAR | Status: AC | PRN
  Administered 2019-08-03: 10 mL

## 2019-08-03 MED ORDER — MIDAZOLAM HCL 2 MG/2ML IJ SOLN
INTRAMUSCULAR | Status: AC
  Filled 2019-08-03: qty 2

## 2019-08-03 MED ORDER — SODIUM CHLORIDE 0.9 % IV SOLN: INTRAVENOUS | Status: DC

## 2019-08-03 MED ORDER — FENTANYL CITRATE (PF) 100 MCG/2ML IJ SOLN
INTRAMUSCULAR | Status: AC
  Filled 2019-08-03: qty 2

## 2019-08-03 MED ORDER — CEFAZOLIN SODIUM-DEXTROSE 2-4 GM/100ML-% IV SOLN
INTRAVENOUS | Status: AC
  Filled 2019-08-03: qty 100

## 2019-08-03 MED ORDER — HEPARIN SOD (PORK) LOCK FLUSH 100 UNIT/ML IV SOLN
INTRAVENOUS | Status: AC
  Filled 2019-08-03: qty 5

## 2019-08-03 MED ORDER — LIDOCAINE-EPINEPHRINE (PF) 1 %-1:200000 IJ SOLN
INTRAMUSCULAR | Status: AC | PRN
  Administered 2019-08-03: 10 mL

## 2019-08-03 MED ORDER — LIDOCAINE HCL 1 % IJ SOLN
INTRAMUSCULAR | Status: AC
  Filled 2019-08-03: qty 20

## 2019-08-03 MED ORDER — FENTANYL CITRATE (PF) 100 MCG/2ML IJ SOLN
INTRAMUSCULAR | Status: AC | PRN
  Administered 2019-08-03 (×2): 50 ug via INTRAVENOUS

## 2019-08-03 MED ORDER — HEPARIN SOD (PORK) LOCK FLUSH 100 UNIT/ML IV SOLN
INTRAVENOUS | Status: AC | PRN
  Administered 2019-08-03: 500 [IU] via INTRAVENOUS

## 2019-08-03 MED ORDER — MIDAZOLAM HCL 2 MG/2ML IJ SOLN
INTRAMUSCULAR | Status: AC | PRN
  Administered 2019-08-03 (×2): 1 mg via INTRAVENOUS

## 2019-08-03 MED ORDER — LIDOCAINE-EPINEPHRINE 1 %-1:100000 IJ SOLN
INTRAMUSCULAR | Status: AC
  Filled 2019-08-03: qty 1

## 2019-08-03 MED ORDER — CEFAZOLIN SODIUM-DEXTROSE 2-4 GM/100ML-% IV SOLN
2.0000 g | Freq: Once | INTRAVENOUS | Status: AC
  Administered 2019-08-03: 13:00:00 2 g via INTRAVENOUS

## 2019-08-03 NOTE — H&P (Signed)
Chief Complaint: Patient was seen in consultation today for adenocarcinoma of gastroesophageal junction/Port-a-cath placement.  Referring Physician(s): Firefighter  Supervising Physician: Markus Daft  Patient Status: [facility] - Out-pt  History of Present Illness: Steven Crane is a 59 y.o. male with a past medical history of hypertension, hyperlipidemia/hypertriglyeridemia, MI 2011, HF, CAD, chronic bronchitis, asthma, GERD, cancer of gastroesophageal junction, nephrolithiasis, diabetes mellitus type II, arthritis, morbid obesity, depression, and anxiety. He was unfortunately diagnosed with adenocarcinoma of gastroesophageal junction in 06/2019. His cancer is managed by Dr. Delton Coombes. He has completed 1 week of chemoradiation at this time.  IR requested by Dr. Delton Coombes for possible image-guided Port-a-cath placement. Patient awake and alert sitting in bed watching TV with no complaints at this time. Denies fever, chills, chest pain, dyspnea, abdominal pain, or headache.   Past Medical History:  Diagnosis Date  . Anxiety   . Arthritis    "hands, back, knees" (08/27/2013)  . Asthma   . CHF (congestive heart failure) ([place]) 02/2010  . Chronic bronchitis ([place])    "get it q year"  . Chronic lower back pain   . Closed head injury 1975  . Coronary artery disease May 2015   s/p successful PTCA/DES x 1 to distal RCA and PTCA/DES x 1 to OM-20 Aug 2013  . Depression   . DVT (deep venous thrombosis) ([place]) 2008   "LLE"  . Esophageal cancer ([place])   . GERD (gastroesophageal reflux disease)   . Headache    hs of but no longer has problems   . History of kidney stones   . Hyperlipemia   . Hypertension   . Hypertriglyceridemia   . LV dysfunction    LVEF 40% at time of cardiac cath May 2015  . Morbid obesity ([place])   . Myocardial infarction ([place]) 02/2010  . Psoriasis   . Type II diabetes mellitus ([place])     Past Surgical History:  Procedure Laterality Date  . BIOPSY  06/17/2019   Procedure: BIOPSY;  Surgeon: [name], MD;  Location: [facility] ENDO SUITE;  Service: Endoscopy;;  . COLONOSCOPY  01/06/2012   Dr. [name]: suboptimal prep. normal exam. next colonoscopy in 10 years.   . CORONARY ANGIOPLASTY WITH STENT PLACEMENT  02/2010; 08/27/2013   "1 + 3"   . ESOPHAGOGASTRODUODENOSCOPY (EGD) WITH PROPOFOL N/A 06/17/2019   Procedure: ESOPHAGOGASTRODUODENOSCOPY (EGD) WITH PROPOFOL;  Surgeon: [name], MD;  Location: [facility] ENDO SUITE;  Service: Endoscopy;  Laterality: N/A;  11:15am  . KNEE ARTHROSCOPY Left 1981  . LEFT HEART CATHETERIZATION WITH CORONARY ANGIOGRAM N/A 08/27/2013   Procedure: LEFT HEART CATHETERIZATION WITH CORONARY ANGIOGRAM;  Surgeon: [name], MD;  Location: [facility] CATH LAB;  Service: Cardiovascular;  Laterality: N/A;  . right wrist fracture surgery     . TIBIA IM NAIL INSERTION Left 08/21/2018   Procedure: INTRAMEDULLARY (IM) NAIL TIBIAL;  Surgeon: Rod Can, MD;  Location: [facility] ORS;  Service: Orthopedics;  Laterality: Left;    Allergies: Adhesive [tape]  Medications: Prior to Admission medications   Medication Sig Start Date End Date Taking? Authorizing Provider  acetaminophen (TYLENOL) 500 MG tablet Take 1,000 mg by mouth in the morning and at bedtime.    Yes [provider]  albuterol (PROVENTIL HFA;VENTOLIN HFA) 108 (90 Base) MCG/ACT inhaler Inhale 2 puffs into the lungs every 6 (six) hours as needed for wheezing or shortness of breath. 01/28/17  Yes Luking, Elayne Snare, MD  albuterol (VENTOLIN HFA) 108 (90 Base) MCG/ACT inhaler INHALE 2 PUFFS  BY MOUTH EVERY 6 HOURS AS NEEDED FOR WHEEZING FOR SHORTNESS OF BREATH 04/29/19  Yes [name], MD  amLODipine (NORVASC) 5 MG tablet Take 1 tablet (5 mg total) by mouth daily. 07/27/19  Yes [name], MD  aspirin EC 81 MG tablet Take 1 tablet (81 mg total) by mouth daily. 04/10/18  Yes Herminio Commons, MD  atorvastatin (LIPITOR) 40 MG tablet Take 1 tablet by mouth once  daily Patient taking differently: Take 40 mg by mouth at bedtime.  07/15/19  Yes Herminio Commons, MD  dexlansoprazole (DEXILANT) 60 MG capsule Take 1 capsule (60 mg total) by mouth daily as needed (reflux). 07/27/19  Yes [name], MD  enalapril (VASOTEC) 20 MG tablet Take 1 tablet (20 mg total) by mouth 2 (two) times daily. 07/27/19  Yes [name], MD  fenofibrate 160 MG tablet Take 1 tablet (160 mg total) by mouth daily. 07/27/19  Yes [name], MD  glyBURIDE-metformin (GLUCOVANCE) 5-500 MG tablet Take 2 tablets by mouth 2 (two) times daily. 07/27/19  Yes [name], MD  metoprolol succinate (TOPROL-XL) 50 MG 24 hr tablet TAKE 1 TABLET BY MOUTH ONCE DAILY WITH OR IMMEDIATELY FOLLOWING A MEAL 07/27/19  Yes [name], MD  PACLITAXEL IV Inject 50 mg/m2 into the vein once a week.  07/26/19  Yes [provider]  pentoxifylline (TRENTAL) 400 MG CR tablet Take 1 tablet (400 mg total) by mouth 3 (three) times daily with meals. 07/28/19  Yes McKenzie, Candee Furbish, MD  prochlorperazine (COMPAZINE) 10 MG tablet Take 1 tablet (10 mg total) by mouth every 6 (six) hours as needed for nausea or vomiting. 07/21/19  Yes Derek Jack, MD  ALPRAZolam Duanne Moron) 0.5 MG tablet TAKE ONE-HALF TO ONE TABLET BY MOUTH ONCE DAILY AS NEEDED Patient not taking: Reported on 08/02/2019 07/27/19   [name], MD  blood glucose meter kit and supplies Test glucose once daily. ICD 10 E11.9 09/26/17   [name], MD  lidocaine-prilocaine (EMLA) cream Apply a small amount to port a cath site and cover with plastic wrap one hour prior to infusion appointments Patient not taking: Reported on 08/02/2019 07/21/19   Derek Jack, MD     Family History  Problem Relation Age of Onset  . Coronary artery disease Father   . Diabetes type II Father   . Diabetes Father   . Heart attack Father   . Hypertension Mother     Social History   Socioeconomic History  . Marital status:  Single    Spouse name: Not on file  . Number of children: 3  . Years of education: Not on file  . Highest education level: Not on file  Occupational History  . Occupation: [employer]  Tobacco Use  . Smoking status: Never Smoker  . Smokeless tobacco: Current User    Types: Snuff, Chew  . Tobacco comment: "started dipping @ age 34; quit for 5 1/2 yrs at one time; hadn't chewed in awhile"  Substance and Sexual Activity  . Alcohol use: Yes    Alcohol/week: 0.0 standard drinks    Comment: occ   . Drug use: No  . Sexual activity: Yes  Other Topics Concern  . Not on file  Social History Narrative  . Not on file   Social Determinants of Health   Financial Resource Strain: Low Risk   . Difficulty of Paying Living Expenses: Not hard at all  Food Insecurity: No Food Insecurity  .  Worried About Charity fundraiser in the Last Year: Never true  . Ran Out of Food in the Last Year: Never true  Transportation Needs: No Transportation Needs  . Lack of Transportation (Medical): No  . Lack of Transportation (Non-Medical): No  Physical Activity: Insufficiently Active  . Days of Exercise per Week: 1 day  . Minutes of Exercise per Session: 20 min  Stress: No Stress Concern Present  . Feeling of Stress : Not at all  Social Connections: Slightly Isolated  . Frequency of Communication with Friends and Family: Three times a week  . Frequency of Social Gatherings with Friends and Family: Three times a week  . Attends Religious Services: 1 to 4 times per year  . Active Member of Clubs or Organizations: No  . Attends Archivist Meetings: Never  . Marital Status: Living with partner     Review of Systems: A 12 point ROS discussed and pertinent positives are indicated in the HPI above.  All other systems are negative.  Review of Systems  Constitutional: Negative for chills and fever.  Respiratory: Negative for shortness of breath and wheezing.   Cardiovascular: Negative for chest pain and  palpitations.  Gastrointestinal: Negative for abdominal pain.  Neurological: Negative for headaches.  Psychiatric/Behavioral: Negative for behavioral problems and confusion.    Vital Signs: BP 118/66   Pulse 75   Temp 98.4 F (36.9 C) (Skin)   Resp 17   Ht _0  (1.88 m)   Wt 274 lb (124.3 kg)   SpO2 95%   BMI 35.18 kg/m   Physical Exam Vitals and nursing note reviewed.  Constitutional:      General: He is not in acute distress.    Appearance: Normal appearance.  Cardiovascular:     Rate and Rhythm: Normal rate and regular rhythm.     Heart sounds: Normal heart sounds. No murmur.  Pulmonary:     Effort: Pulmonary effort is normal. No respiratory distress.     Breath sounds: Normal breath sounds. No wheezing.  Skin:    General: Skin is warm and dry.  Neurological:     Mental Status: He is alert and oriented to person, place, and time.  Psychiatric:        Mood and Affect: Mood normal.        Behavior: Behavior normal.      MD Evaluation Airway: WNL Heart: WNL Abdomen: WNL Chest/ Lungs: WNL ASA  Classification: 3 Mallampati/Airway Score: One   Imaging: NM PET Image Initial (PI) Skull Base To Thigh  Result Date: 07/21/2019 CLINICAL DATA:  Initial treatment strategy for esophageal adenocarcinoma. EXAM: NUCLEAR MEDICINE PET SKULL BASE TO THIGH TECHNIQUE: 15.57 mCi F-18 FDG was injected intravenously. Full-ring PET imaging was performed from the skull base to thigh after the radiotracer. CT data was obtained and used for attenuation correction and anatomic localization. Fasting blood glucose: 118 mg/dl COMPARISON:  CTs of the chest, abdomen and pelvis 06/21/2019 FINDINGS: Mediastinal blood pool activity: SUV max 2.4 NECK: No hypermetabolic cervical lymph nodes are identified.There are no lesions of the pharyngeal mucosal space. The cervical esophagus appears unremarkable. Incidental CT findings: none CHEST: There is long segment hypermetabolic activity involving the  distal esophagus, extending into the gastric cardia. This extends over approximately 9 cm in length and has an SUV max of 37.4. There are no hypermetabolic mediastinal, hilar or axillary lymph nodes. There is no hypermetabolic pulmonary activity or suspicious pulmonary nodularity. Incidental CT findings: Extensive three-vessel coronary artery  atherosclerosis. ABDOMEN/PELVIS: There is no hypermetabolic activity within the liver, adrenal glands, spleen or pancreas. There is no hypermetabolic nodal activity. Specifically, there is no hypermetabolic activity within the small lymph nodes in the gastrohepatic ligament. Incidental CT findings: Hepatic steatosis, nonobstructing bilateral renal calculi and a broad-based umbilical hernia containing only fat are again noted. There is mild aortic and branch vessel atherosclerosis. SKELETON: There is no hypermetabolic activity to suggest osseous metastatic disease. Incidental CT findings: none IMPRESSION: 1. The known adenocarcinoma involving the distal esophagus is markedly hypermetabolic. 2. The adjacent small lymph nodes in the gastrohepatic ligament seen on CT are not hypermetabolic. No distant metastases. 3. Stable incidental findings including hepatic steatosis and extensive three-vessel coronary artery atherosclerosis. Electronically Signed   By: Richardean Sale M.D.   On: 07/21/2019 08:13    Labs:  CBC: Recent Labs    08/21/18 0720 08/22/18 0337 07/26/19 0937 08/02/19 0945  WBC 7.5 6.9 6.1 3.3*  HGB 12.8* 11.6* 12.9* 12.5*  HCT 39.3 36.3* 39.9 38.2*  PLT 250 225 297 237    COAGS: Recent Labs    08/21/18 0720  INR 0.9  APTT 38*    BMP: Recent Labs    06/15/19 1415 07/22/19 0807 07/26/19 0937 08/02/19 0945  NA 133* 141 135 134*  K 4.3 5.2 4.7 4.6  CL 101 105 102 102  CO2 _0 GLUCOSE 178* 134* 243* 158*  BUN _1 CALCIUM 8.6* 9.3 8.8* 8.9  CREATININE 0.70 0.69* 0.75 0.65  GFRNONAA >60 105 >60 >60  GFRAA >60 121 >60  >60    LIVER FUNCTION TESTS: Recent Labs    01/08/19 0824 07/22/19 0807 07/26/19 0937 08/02/19 0945  BILITOT 0.3 0.2 0.6 0.7  AST _2 ALT 39 _3 ALKPHOS 56 49 38 40  PROT 6.5 6.8 6.7 6.9  ALBUMIN 4.1 4.2 3.8 3.8     Assessment and Plan:  Adenocarcinoma of gastroesophageal junction, currently undergoing chemoradiation therapy as management. Plan for image-guided Port-a-cath placement today in IR. Patient is NPO. Afebrile.  Risks and benefits of image-guided Port-a-catheter placement were discussed with the patient including, but not limited to bleeding, infection, pneumothorax, or fibrin sheath development and need for additional procedures. All of the patient's questions were answered, patient is agreeable to proceed. Consent signed and in chart.   Thank you for this interesting consult.  I greatly enjoyed meeting Steven Crane and look forward to participating in their care.  A copy of this report was sent to the requesting provider on this date.  Electronically Signed: Earley Abide, PA-C 08/03/2019, 11:23 AM   I spent a total of 30 Minutes in face to face in clinical consultation, greater than 50% of which was counseling/coordinating care for adenocarcinoma of gastroesophageal junction/Port-a-cath placement.

## 2019-08-03 NOTE — Procedures (Signed)
Interventional Radiology Procedure:   Indications: GE junction adenocarcinoma.  Procedure: Port placement  Findings: Right jugular port, tip at SVC/RA junction  Complications: None     EBL: Minimal, less than 10 ml  Plan: Discharge in one hour.  Keep port site and incisions dry for at least 24 hours.     Maryalice Pasley R. Anselm Pancoast, MD  Pager: [phone]

## 2019-08-03 NOTE — Discharge Instructions (Signed)
Implanted Port Home Guide An implanted port is a device that is placed under the skin. It is usually placed in the chest. The device can be used to give IV medicine, to take blood, or for dialysis. You may have an implanted port if:  You need IV medicine that would be irritating to the small veins in your hands or arms.  You need IV medicines, such as antibiotics, for a long period of time.  You need IV nutrition for a long period of time.  You need dialysis. Having a port means that your health care provider will not need to use the veins in your arms for these procedures. You may have fewer limitations when using a port than you would if you used other types of long-term IVs, and you will likely be able to return to normal activities after your incision heals. An implanted port has two main parts:  Reservoir. The reservoir is the part where a needle is inserted to give medicines or draw blood. The reservoir is round. After it is placed, it appears as a small, raised area under your skin.  Catheter. The catheter is a thin, flexible tube that connects the reservoir to a vein. Medicine that is inserted into the reservoir goes into the catheter and then into the vein. How is my port accessed? To access your port:  A numbing cream may be placed on the skin over the port site.  Your health care provider will put on a mask and sterile gloves.  The skin over your port will be cleaned carefully with a germ-killing soap and allowed to dry.  Your health care provider will gently pinch the port and insert a needle into it.  Your health care provider will check for a blood return to make sure the port is in the vein and is not clogged.  If your port needs to remain accessed to get medicine continuously (constant infusion), your health care provider will place a clear bandage (dressing) over the needle site. The dressing and needle will need to be changed every week, or as told by your health care  provider. What is flushing? Flushing helps keep the port from getting clogged. Follow instructions from your health care provider about how and when to flush the port. Ports are usually flushed with saline solution or a medicine called heparin. The need for flushing will depend on how the port is used:  If the port is only used from time to time to give medicines or draw blood, the port may need to be flushed: ? Before and after medicines have been given. ? Before and after blood has been drawn. ? As part of routine maintenance. Flushing may be recommended every 4-6 weeks.  If a constant infusion is running, the port may not need to be flushed.  Throw away any syringes in a disposal container that is meant for sharp items (sharps container). You can buy a sharps container from a pharmacy, or you can make one by using an empty hard plastic bottle with a cover. How long will my port stay implanted? The port can stay in for as long as your health care provider thinks it is needed. When it is time for the port to come out, a surgery will be done to remove it. The surgery will be similar to the procedure that was done to put the port in. Follow these instructions at home:   Flush your port as told by your health care provider.    If you need an infusion over several days, follow instructions from your health care provider about how to take care of your port site. Make sure you: ? Wash your hands with soap and water before you change your dressing. If soap and water are not available, use alcohol-based hand sanitizer. ? Change your dressing as told by your health care provider. ? Place any used dressings or infusion bags into a plastic bag. Throw that bag in the trash. ? Keep the dressing that covers the needle clean and dry. Do not get it wet. ? Do not use scissors or sharp objects near the tube. ? Keep the tube clamped, unless it is being used.  Check your port site every day for signs of  infection. Check for: ? Redness, swelling, or pain. ? Fluid or blood. ? Pus or a bad smell.  Protect the skin around the port site. ? Avoid wearing bra straps that rub or irritate the site. ? Protect the skin around your port from seat belts. Place a soft pad over your chest if needed.  Bathe or shower as told by your health care provider. The site may get wet as long as you are not actively receiving an infusion.  Return to your normal activities as told by your health care provider. Ask your health care provider what activities are safe for you.  Carry a medical alert card or wear a medical alert bracelet at all times. This will let health care providers know that you have an implanted port in case of an emergency. Get help right away if:  You have redness, swelling, or pain at the port site.  You have fluid or blood coming from your port site.  You have pus or a bad smell coming from the port site.  You have a fever. Summary  Implanted ports are usually placed in the chest for long-term IV access.  Follow instructions from your health care provider about flushing the port and changing bandages (dressings).  Take care of the area around your port by avoiding clothing that puts pressure on the area, and by watching for signs of infection.  Protect the skin around your port from seat belts. Place a soft pad over your chest if needed.  Get help right away if you have a fever or you have redness, swelling, pain, drainage, or a bad smell at the port site. This information is not intended to replace advice given to you by your health care provider. Make sure you discuss any questions you have with your health care provider. Document Revised: 07/31/2018 Document Reviewed: 05/11/2016 Elsevier Patient Education  2020 Elsevier Inc.   Moderate Conscious Sedation, Adult, Care After These instructions provide you with information about caring for yourself after your procedure. Your  health care provider may also give you more specific instructions. Your treatment has been planned according to current medical practices, but problems sometimes occur. Call your health care provider if you have any problems or questions after your procedure. What can I expect after the procedure? After your procedure, it is common:  To feel sleepy for several hours.  To feel clumsy and have poor balance for several hours.  To have poor judgment for several hours.  To vomit if you eat too soon. Follow these instructions at home: For at least 24 hours after the procedure:   Do not: ? Participate in activities where you could fall or become injured. ? Drive. ? Use heavy machinery. ? Drink alcohol. ?   Take sleeping pills or medicines that cause drowsiness. ? Make important decisions or sign legal documents. ? Take care of children on your own.  Rest. Eating and drinking  Follow the diet recommended by your health care provider.  If you vomit: ? Drink water, juice, or soup when you can drink without vomiting. ? Make sure you have little or no nausea before eating solid foods. General instructions  Have a responsible adult stay with you until you are awake and alert.  Take over-the-counter and prescription medicines only as told by your health care provider.  If you smoke, do not smoke without supervision.  Keep all follow-up visits as told by your health care provider. This is important. Contact a health care provider if:  You keep feeling nauseous or you keep vomiting.  You feel light-headed.  You develop a rash.  You have a fever. Get help right away if:  You have trouble breathing. This information is not intended to replace advice given to you by your health care provider. Make sure you discuss any questions you have with your health care provider. Document Revised: 03/21/2017 Document Reviewed: 07/29/2015 Elsevier Patient Education  2020 Elsevier Inc.   

## 2019-08-04 NOTE — Progress Notes (Signed)

## 2019-08-06 ENCOUNTER — Ambulatory Visit (HOSPITAL_COMMUNITY): Payer: BC Managed Care – PPO

## 2019-08-06 NOTE — Progress Notes (Signed)
Nutrition Follow-up:  Patient with new diagnosis of esophageal cancer.  Patient has started radiation and chemotherapy (4/5 first treatment).    Called patient for nutrition follow-up.  Patient reports that he is getting ready to eat oatmeal and 2 eggs this am after radiation.  Last night was able to eat pork chop and 6-8 chicken fries.  Lunch was 2 orders of mashed potatoes, butter beans and lima beans and macaroni and cheese.  Reports drank 1 shake yesterday, had stopped drinking them due to constipation issues.  Tried higher calorie shakes but did not like them.  "I am going to have to mix some ice cream or something in them.  Has been trying to drink water especially after drinking protein shakes.    Reports that he had bowel movement yesterday and one this am.  Continues to take miralax and stool softners.     Medications: reviewed  Labs: glucose 158  Anthropometrics:   Weight 274 lb on 4/13 decreased from 285 lb on 3/19  UBW 306 lb  4% weight loss in the last month, concerning   NUTRITION DIAGNOSIS: Inadequate oral intake continues   INTERVENTION:  Stressed importance of continuing bowel regimen to improve constipation.   Encouraged patient to try adding back protein shakes with bowels moving and continuing to take bowel regimen. Discussed ways to mix oral nutrition supplements to improve flavor.   Discussed ways to add calories and protein in current eating pattern (mix oatmeal with whole milk, use butter, sauces, gravies, etc).   Patient has contact information    MONITORING, EVALUATION, GOAL: Patient will consume adequate calories and protein to prevent weight loss.    NEXT VISIT: phone f/u April 23  Lettie Czarnecki B. Zenia Resides, [place], [place] Registered Dietitian [phone] (pager)

## 2019-08-09 ENCOUNTER — Inpatient Hospital Stay (HOSPITAL_BASED_OUTPATIENT_CLINIC_OR_DEPARTMENT_OTHER): Payer: BC Managed Care – PPO | Admitting: Hematology

## 2019-08-09 ENCOUNTER — Inpatient Hospital Stay (HOSPITAL_COMMUNITY): Payer: BC Managed Care – PPO

## 2019-08-09 ENCOUNTER — Encounter (HOSPITAL_COMMUNITY): Payer: Self-pay | Admitting: Hematology

## 2019-08-09 ENCOUNTER — Other Ambulatory Visit (HOSPITAL_COMMUNITY): Payer: BC Managed Care – PPO

## 2019-08-09 ENCOUNTER — Other Ambulatory Visit: Payer: Self-pay

## 2019-08-09 VITALS — BP 127/66 | HR 68 | Temp 98.1°F | Resp 18

## 2019-08-09 DIAGNOSIS — C160 Malignant neoplasm of cardia: Secondary | ICD-10-CM

## 2019-08-09 LAB — CBC WITH DIFFERENTIAL/PLATELET
Abs Immature Granulocytes: 0.01 10*3/uL (ref 0.00–0.07)
Basophils Absolute: 0 10*3/uL (ref 0.0–0.1)
Basophils Relative: 1 %
Eosinophils Absolute: 0.1 10*3/uL (ref 0.0–0.5)
Eosinophils Relative: 2 %
HCT: 36.2 % — ABNORMAL LOW (ref 39.0–52.0)
Hemoglobin: 12 g/dL — ABNORMAL LOW (ref 13.0–17.0)
Immature Granulocytes: 1 %
Lymphocytes Relative: 13 %
Lymphs Abs: 0.3 10*3/uL — ABNORMAL LOW (ref 0.7–4.0)
MCH: 28.7 pg (ref 26.0–34.0)
MCHC: 33.1 g/dL (ref 30.0–36.0)
MCV: 86.6 fL (ref 80.0–100.0)
Monocytes Absolute: 0.2 10*3/uL (ref 0.1–1.0)
Monocytes Relative: 10 %
Neutro Abs: 1.6 10*3/uL — ABNORMAL LOW (ref 1.7–7.7)
Neutrophils Relative %: 73 %
Platelets: 192 10*3/uL (ref 150–400)
RBC: 4.18 MIL/uL — ABNORMAL LOW (ref 4.22–5.81)
RDW: 13.7 % (ref 11.5–15.5)
WBC: 2.2 10*3/uL — ABNORMAL LOW (ref 4.0–10.5)
nRBC: 0 % (ref 0.0–0.2)

## 2019-08-09 LAB — COMPREHENSIVE METABOLIC PANEL
ALT: 25 U/L (ref 0–44)
AST: 17 U/L (ref 15–41)
Albumin: 3.4 g/dL — ABNORMAL LOW (ref 3.5–5.0)
Alkaline Phosphatase: 34 U/L — ABNORMAL LOW (ref 38–126)
Anion gap: 10 (ref 5–15)
BUN: 8 mg/dL (ref 6–20)
CO2: 23 mmol/L (ref 22–32)
Calcium: 8.7 mg/dL — ABNORMAL LOW (ref 8.9–10.3)
Chloride: 103 mmol/L (ref 98–111)
Creatinine, Ser: 0.47 mg/dL — ABNORMAL LOW (ref 0.61–1.24)
GFR calc Af Amer: >60 mL/min (ref >60)
GFR calc non Af Amer: >60 mL/min (ref >60)
Glucose, Bld: 140 mg/dL — ABNORMAL HIGH (ref 70–99)
Potassium: 4.4 mmol/L (ref 3.5–5.1)
Sodium: 136 mmol/L (ref 135–145)
Total Bilirubin: 0.3 mg/dL (ref 0.3–1.2)
Total Protein: 6.2 g/dL — ABNORMAL LOW (ref 6.5–8.1)

## 2019-08-09 LAB — MAGNESIUM: Magnesium: 2 mg/dL (ref 1.7–2.4)

## 2019-08-09 MED ORDER — SODIUM CHLORIDE 0.9 % IV SOLN: Freq: Once | INTRAVENOUS | Status: AC

## 2019-08-09 MED ORDER — SODIUM CHLORIDE 0.9 % IV SOLN
300.0000 mg | Freq: Once | INTRAVENOUS | Status: AC
  Administered 2019-08-09: 300 mg via INTRAVENOUS
  Filled 2019-08-09: qty 30

## 2019-08-09 MED ORDER — LACTULOSE 20 GM/30ML PO SOLN: 30.0000 mL | Freq: Every day | ORAL | 2 refills | Status: DC

## 2019-08-09 MED ORDER — PALONOSETRON HCL INJECTION 0.25 MG/5ML
0.2500 mg | Freq: Once | INTRAVENOUS | Status: AC
  Administered 2019-08-09: 0.25 mg via INTRAVENOUS
  Filled 2019-08-09: qty 5

## 2019-08-09 MED ORDER — SODIUM CHLORIDE 0.9 % IV SOLN
20.0000 mg | Freq: Once | INTRAVENOUS | Status: AC
  Administered 2019-08-09: 12:00:00 20 mg via INTRAVENOUS
  Filled 2019-08-09: qty 20

## 2019-08-09 MED ORDER — HEPARIN SOD (PORK) LOCK FLUSH 100 UNIT/ML IV SOLN
500.0000 [IU] | Freq: Once | INTRAVENOUS | Status: AC | PRN
  Administered 2019-08-09: 14:00:00 500 [IU]

## 2019-08-09 MED ORDER — SODIUM CHLORIDE 0.9 % IV SOLN
50.0000 mg/m2 | Freq: Once | INTRAVENOUS | Status: AC
  Administered 2019-08-09: 132 mg via INTRAVENOUS
  Filled 2019-08-09: qty 22

## 2019-08-09 MED ORDER — DIPHENHYDRAMINE HCL 50 MG/ML IJ SOLN
50.0000 mg | Freq: Once | INTRAMUSCULAR | Status: AC
  Administered 2019-08-09: 50 mg via INTRAVENOUS
  Filled 2019-08-09: qty 1

## 2019-08-09 MED ORDER — FAMOTIDINE IN NACL 20-0.9 MG/50ML-% IV SOLN
20.0000 mg | Freq: Once | INTRAVENOUS | Status: AC
  Administered 2019-08-09: 20 mg via INTRAVENOUS
  Filled 2019-08-09: qty 50

## 2019-08-09 MED ORDER — SODIUM CHLORIDE 0.9% FLUSH
10.0000 mL | INTRAVENOUS | Status: DC | PRN
  Administered 2019-08-09: 10 mL

## 2019-08-09 NOTE — Progress Notes (Signed)
Steven Crane, [place] [address]   CLINIC:  Medical Oncology/Hematology  PCP:  [name], [place] [address] [phone]   REASON FOR VISIT:  Follow-up for GE junction adenocarcinoma.  CURRENT THERAPY: Chemoradiation therapy.    INTERVAL HISTORY:  Steven Crane 59 y.o. male seen for follow-up of GE junction adenocarcinoma and toxicity assessment prior to week 3 of chemotherapy.  Lost about 3 pounds.  Appetite and energy levels are 50%.  Reports constipation.  Denies tingling or numbness in extremities.  No nausea or vomiting.  Taking stool softener and MiraLAX for constipation which is not helping.  Drinking about 2 protein shakes per day.    REVIEW OF SYSTEMS:  Review of Systems  Gastrointestinal: Positive for constipation.  All other systems reviewed and are negative.    PAST MEDICAL/SURGICAL HISTORY:  Past Medical History:  Diagnosis Date  . Anxiety   . Arthritis    "hands, back, knees" (08/27/2013)  . Asthma   . CHF (congestive heart failure) ([facility]) 02/2010  . Chronic bronchitis ([facility])    "get it q year"  . Chronic lower back pain   . Closed head injury 1975  . Coronary artery disease May 2015   s/p successful PTCA/DES x 1 to distal RCA and PTCA/DES x 1 to OM-20 Aug 2013  . Depression   . DVT (deep venous thrombosis) ([facility]) 2008   "LLE"  . Esophageal cancer ([facility])   . GERD (gastroesophageal reflux disease)   . Headache    hs of but no longer has problems   . History of kidney stones   . Hyperlipemia   . Hypertension   . Hypertriglyceridemia   . LV dysfunction    LVEF 40% at time of cardiac cath May 2015  . Morbid obesity ([facility])   . Myocardial infarction ([facility]) 02/2010  . Psoriasis   . Type II diabetes mellitus ([facility])    Past Surgical History:  Procedure Laterality Date  . BIOPSY  06/17/2019   Procedure: BIOPSY;  Surgeon: [name], MD;  Location: [facility];  Service: Endoscopy;;    . COLONOSCOPY  01/06/2012   Dr. [name]: suboptimal prep. normal exam. next colonoscopy in 10 years.   . CORONARY ANGIOPLASTY WITH STENT PLACEMENT  02/2010; 08/27/2013   "1 + 3"   . ESOPHAGOGASTRODUODENOSCOPY (EGD) WITH PROPOFOL N/A 06/17/2019   Procedure: ESOPHAGOGASTRODUODENOSCOPY (EGD) WITH PROPOFOL;  Surgeon: [name], MD;  Location: [facility];  Service: Endoscopy;  Laterality: N/A;  11:15am  . IR IMAGING GUIDED PORT INSERTION  08/03/2019  . KNEE ARTHROSCOPY Left 1981  . LEFT HEART CATHETERIZATION WITH CORONARY ANGIOGRAM N/A 08/27/2013   Procedure: LEFT HEART CATHETERIZATION WITH CORONARY ANGIOGRAM;  Surgeon: [name], MD;  Location: [facility] CATH LAB;  Service: Cardiovascular;  Laterality: N/A;  . right wrist fracture surgery     . TIBIA IM NAIL INSERTION Left 08/21/2018   Procedure: INTRAMEDULLARY (IM) NAIL TIBIAL;  Surgeon: Rod Can, MD;  Location: [facility] ORS;  Service: Orthopedics;  Laterality: Left;     SOCIAL HISTORY:  Social History   Socioeconomic History  . Marital status: Single    Spouse name: Not on file  . Number of children: 3  . Years of education: Not on file  . Highest education level: Not on file  Occupational History  . Occupation: [employer]  Tobacco Use  . Smoking status: Never Smoker  . Smokeless tobacco: Current User  Types: Snuff, Chew  . Tobacco comment: "started dipping @ age 25; quit for 5 1/2 yrs at one time; hadn't chewed in awhile"  Substance and Sexual Activity  . Alcohol use: Yes    Alcohol/week: 0.0 standard drinks    Comment: occ   . Drug use: No  . Sexual activity: Yes  Other Topics Concern  . Not on file  Social History Narrative  . Not on file   Social Determinants of Health   Financial Resource Strain: Low Risk   . Difficulty of Paying Living Expenses: Not hard at all  Food Insecurity: No Food Insecurity  . Worried About Charity fundraiser in the Last Year: Never true  . Ran Out of Food in the Last Year: Never true   Transportation Needs: No Transportation Needs  . Lack of Transportation (Medical): No  . Lack of Transportation (Non-Medical): No  Physical Activity: Insufficiently Active  . Days of Exercise per Week: 1 day  . Minutes of Exercise per Session: 20 min  Stress: No Stress Concern Present  . Feeling of Stress : Not at all  Social Connections: Slightly Isolated  . Frequency of Communication with Friends and Family: Three times a week  . Frequency of Social Gatherings with Friends and Family: Three times a week  . Attends Religious Services: 1 to 4 times per year  . Active Member of Clubs or Organizations: No  . Attends Archivist Meetings: Never  . Marital Status: Living with partner  Intimate Partner Violence: Not At Risk  . Fear of Current or Ex-Partner: No  . Emotionally Abused: No  . Physically Abused: No  . Sexually Abused: No    FAMILY HISTORY:  Family History  Problem Relation Age of Onset  . Coronary artery disease Father   . Diabetes type II Father   . Diabetes Father   . Heart attack Father   . Hypertension Mother     CURRENT MEDICATIONS:  Outpatient Encounter Medications as of 08/09/2019  Medication Sig Note  . amLODipine (NORVASC) 5 MG tablet Take 1 tablet (5 mg total) by mouth daily.   Marland Kitchen aspirin EC 81 MG tablet Take 1 tablet (81 mg total) by mouth daily.   Marland Kitchen atorvastatin (LIPITOR) 40 MG tablet Take 1 tablet by mouth once daily (Patient taking differently: Take 40 mg by mouth at bedtime. )   . blood glucose meter kit and supplies Test glucose once daily. ICD 10 E11.9   . enalapril (VASOTEC) 20 MG tablet Take 1 tablet (20 mg total) by mouth 2 (two) times daily.   . fenofibrate 160 MG tablet Take 1 tablet (160 mg total) by mouth daily.   Marland Kitchen glyBURIDE-metformin (GLUCOVANCE) 5-500 MG tablet Take 2 tablets by mouth 2 (two) times daily.   . metoprolol succinate (TOPROL-XL) 50 MG 24 hr tablet TAKE 1 TABLET BY MOUTH ONCE DAILY WITH OR IMMEDIATELY FOLLOWING A MEAL    . PACLITAXEL IV Inject 50 mg/m2 into the vein once a week.    . pentoxifylline (TRENTAL) 400 MG CR tablet Take 1 tablet (400 mg total) by mouth 3 (three) times daily with meals.   Marland Kitchen acetaminophen (TYLENOL) 500 MG tablet Take 1,000 mg by mouth in the morning and at bedtime.    Marland Kitchen albuterol (PROVENTIL HFA;VENTOLIN HFA) 108 (90 Base) MCG/ACT inhaler Inhale 2 puffs into the lungs every 6 (six) hours as needed for wheezing or shortness of breath. (Patient not taking: Reported on 08/09/2019)   . albuterol (VENTOLIN  HFA) 108 (90 Base) MCG/ACT inhaler INHALE 2 PUFFS BY MOUTH EVERY 6 HOURS AS NEEDED FOR WHEEZING FOR SHORTNESS OF BREATH (Patient not taking: Reported on 08/09/2019)   . ALPRAZolam (XANAX) 0.5 MG tablet TAKE ONE-HALF TO ONE TABLET BY MOUTH ONCE DAILY AS NEEDED (Patient not taking: Reported on 08/02/2019)   . dexlansoprazole (DEXILANT) 60 MG capsule Take 1 capsule (60 mg total) by mouth daily as needed (reflux). (Patient not taking: Reported on 08/09/2019)   . Lactulose 20 GM/30ML SOLN Take 30 mLs (20 g total) by mouth at bedtime. May increase to twice a day if needed.   . lidocaine-prilocaine (EMLA) cream Apply a small amount to port a cath site and cover with plastic wrap one hour prior to infusion appointments (Patient not taking: Reported on 08/02/2019) 07/23/2019: Have not started yet  . prochlorperazine (COMPAZINE) 10 MG tablet Take 1 tablet (10 mg total) by mouth every 6 (six) hours as needed for nausea or vomiting. (Patient not taking: Reported on 08/09/2019) 07/23/2019: Have not started yet   No facility-administered encounter medications on file as of 08/09/2019.    ALLERGIES:  Allergies  Allergen Reactions  . Adhesive [Tape] Rash     PHYSICAL EXAM:  ECOG Performance status: 1  Vitals:   08/09/19 0942  BP: 125/72  Pulse: 69  Resp: 18  Temp: (!) 97.1 F (36.2 C)  SpO2: 100%   Filed Weights   08/09/19 0942  Weight: 272 lb 3.2 oz (123.5 kg)    Physical Exam Vitals reviewed.   Constitutional:      Appearance: Normal appearance.  Cardiovascular:     Rate and Rhythm: Normal rate and regular rhythm.     Heart sounds: Normal heart sounds.  Pulmonary:     Effort: Pulmonary effort is normal.     Breath sounds: Normal breath sounds.  Abdominal:     General: There is no distension.     Palpations: Abdomen is soft. There is no mass.  Skin:    General: Skin is warm.  Neurological:     General: No focal deficit present.     Mental Status: He is alert and oriented to person, place, and time.  Psychiatric:        Mood and Affect: Mood normal.        Behavior: Behavior normal.      LABORATORY DATA:  I have reviewed the labs as listed.  CBC    Component Value Date/Time   WBC 2.2 (L) 08/09/2019 0949   RBC 4.18 (L) 08/09/2019 0949   HGB 12.0 (L) 08/09/2019 0949   HCT 36.2 (L) 08/09/2019 0949   PLT 192 08/09/2019 0949   MCV 86.6 08/09/2019 0949   MCH 28.7 08/09/2019 0949   MCHC 33.1 08/09/2019 0949   RDW 13.7 08/09/2019 0949   LYMPHSABS 0.3 (L) 08/09/2019 0949   MONOABS 0.2 08/09/2019 0949   EOSABS 0.1 08/09/2019 0949   BASOSABS 0.0 08/09/2019 0949   CMP Latest Ref Rng & Units 08/09/2019 08/02/2019 07/26/2019  Glucose 70 - 99 mg/dL 140(H) 158(H) 243(H)  BUN 6 - 20 mg/dL _0 Creatinine 0.61 - 1.24 mg/dL 0.47(L) 0.65 0.75  Sodium 135 - 145 mmol/L 136 134(L) 135  Potassium 3.5 - 5.1 mmol/L 4.4 4.6 4.7  Chloride 98 - 111 mmol/L 103 102 102  CO2 22 - 32 mmol/L _1 Calcium 8.9 - 10.3 mg/dL 8.7(L) 8.9 8.8(L)  Total Protein 6.5 - 8.1 g/dL 6.2(L) 6.9 6.7  Total Bilirubin  0.3 - 1.2 mg/dL 0.3 0.7 0.6  Alkaline Phos 38 - 126 U/L 34(L) 40 38  AST 15 - 41 U/L _0 ALT 0 - 44 U/L _1 DIAGNOSTIC IMAGING:  I have reviewed scans.     ASSESSMENT & PLAN:   Primary adenocarcinoma of gastroesophageal junction (HCC) 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 -EGD on 05/28/2019  shows distal esophageal mass extending from 33-38 cm (GE junction) with an additional 3 cm competent involving the proximal cardia of the stomach. -PET scan on 07/20/2019 shows uptake in the mass.  Gastrohepatic lymph nodes are not hypermetabolic.  No metastatic disease. -He was evaluated by Dr. Kipp Brood.  He is a potential resection candidate after chemoradiation. -Weekly carboplatin and paclitaxel started on 07/26/2019. -We reviewed his labs today.  2.2.  However ANC is normal.  Platelets are normal.  LFTs are grossly normal.  He will proceed with his next cycle.  2.  Weight loss/dysphagia: -Lost 3 pounds from last week. -Drinking about 2 Premier protein shakes per day.  If he drinks more, he gets constipated.  He is eating soft foods.  3.  Constipation: -He is taking stool softeners and MiraLAX 1-2 times daily. -Still complains of constipation.  I will start him on lactulose 30 mL to be titrated 2-3 times a day.  4.  Diabetes: -He will continue Glucovance and Levemir.  5.  Family history: -Maternal aunt and 2 maternal uncles died of cancer.  One paternal aunt died of lung cancer.  Multiple maternal cousins died of cancers.  Patient does not know exact types.  He will benefit from genetic evaluation and testing.      Orders placed this encounter:  No orders of the defined types were placed in this encounter.    Derek Jack, MD [place] [phone]

## 2019-08-09 NOTE — Patient Instructions (Addendum)
[place] at [facility] Discharge Instructions  You were seen today by Dr. Delton Coombes. He went over your recent lab results. He sent in a new prescription for Lactulose 46ml to take at bedtime, you may increase to twice a day if needed. He will see you back in 1 week for labs, treatment and follow up.   Thank you for choosing [place] at [facility] to provide your oncology and hematology care.  To afford each patient quality time with our provider, please arrive at least 15 minutes before your scheduled appointment time.   If you have a lab appointment with the [facility] please come in thru the  Main Entrance and check in at the main information desk  You need to re-schedule your appointment should you arrive 10 or more minutes late.  We strive to give you quality time with our providers, and arriving late affects you and other patients whose appointments are after yours.  Also, if you no show three or more times for appointments you may be dismissed from the clinic at the providers discretion.     Again, thank you for choosing [facility].  Our hope is that these requests will decrease the amount of time that you wait before being seen by our physicians.       _____________________________________________________________  Should you have questions after your visit to [facility], please contact our office at [phone] between the hours of 8:00 a.m. and 4:30 p.m.  Voicemails left after 4:00 p.m. will not be returned until the following business day.  For prescription refill requests, have your pharmacy contact our office and allow 72 hours.    Cancer Center Support Programs:   > Cancer Support Group  2nd Tuesday of the month 1pm-2pm, Journey Room

## 2019-08-09 NOTE — Patient Instructions (Signed)
[facility] Discharge Instructions for Patients Receiving Chemotherapy   Beginning January 23rd 2017 lab work for the Cancer Center will be done in the  Main lab at [facility] on 1st floor. If you have a lab appointment with the Cancer Center please come in thru the  Main Entrance and check in at the main information desk   Today you received the following chemotherapy agents Taxol and Carboplatin. Follow-up as scheduled. Call clinic for any questions or concerns  To help prevent nausea and vomiting after your treatment, we encourage you to take your nausea medication.   If you develop nausea and vomiting, or diarrhea that is not controlled by your medication, call the clinic.  The clinic phone number is [phone]. Office hours are Monday-Friday 8:30am-5:00pm.  BELOW ARE SYMPTOMS THAT SHOULD BE REPORTED IMMEDIATELY:  *FEVER GREATER THAN 101.0 F  *CHILLS WITH OR WITHOUT FEVER  NAUSEA AND VOMITING THAT IS NOT CONTROLLED WITH YOUR NAUSEA MEDICATION  *UNUSUAL SHORTNESS OF BREATH  *UNUSUAL BRUISING OR BLEEDING  TENDERNESS IN MOUTH AND THROAT WITH OR WITHOUT PRESENCE OF ULCERS  *URINARY PROBLEMS  *BOWEL PROBLEMS  UNUSUAL RASH Items with * indicate a potential emergency and should be followed up as soon as possible. If you have an emergency after office hours please contact your primary care physician or go to the nearest emergency department.  Please call the clinic during office hours if you have any questions or concerns.   You may also contact the Patient Navigator at [phone] should you have any questions or need assistance in obtaining follow up care.      Resources For Cancer Patients and their Caregivers ? American Cancer Society: Can assist with transportation, wigs, general needs, runs Look Good Feel Better.        1-888-227-6333 ? Cancer Care: Provides financial assistance, online support groups, medication/co-pay assistance.   1-800-813-HOPE (4673) ? [facility] Assists [place] cancer patients and their families through emotional , educational and financial support.  336-427-4357 ? [place] DSS Where to apply for food stamps, Medicaid and utility assistance. 336-342-1394 ? [facility]: Transportation to medical appointments. 336-347-2287 ? Social Security Administration: May apply for disability if have a Stage IV cancer. 336-342-7796 1-800-772-1213 ? [place] Aging, Disability and Transit Services: Assists with nutrition, care and transit needs. 336-349-2343         

## 2019-08-09 NOTE — Progress Notes (Signed)
[facility] reviewed with and pt seen by Dr. Delton Coombes and pt approved for Taxol and Carboplatin infusions today per MD           Desiree Hane Pommier tolerated chemo tx well without complaints or incident. CXR results reviewed prior to accessing portacath today. VSS upon discharge. Pt discharged self ambulatory in satisfactory condition accompanied by his son

## 2019-08-09 NOTE — Assessment & Plan Note (Signed)
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 -EGD on 05/28/2019 shows distal esophageal mass extending from 33-38 cm (GE junction) with an additional 3 cm competent involving the proximal cardia of the stomach. -PET scan on 07/20/2019 shows uptake in the mass.  Gastrohepatic lymph nodes are not hypermetabolic.  No metastatic disease. -He was evaluated by Dr. Kipp Brood.  He is a potential resection candidate after chemoradiation. -Weekly carboplatin and paclitaxel started on 07/26/2019. -We reviewed his labs today.  2.2.  However ANC is normal.  Platelets are normal.  LFTs are grossly normal.  He will proceed with his next cycle.  2.  Weight loss/dysphagia: -Lost 3 pounds from last week. -Drinking about 2 Premier protein shakes per day.  If he drinks more, he gets constipated.  He is eating soft foods.  3.  Constipation: -He is taking stool softeners and MiraLAX 1-2 times daily. -Still complains of constipation.  I will start him on lactulose 30 mL to be titrated 2-3 times a day.  4.  Diabetes: -He will continue Glucovance and Levemir.  5.  Family history: -Maternal aunt and 2 maternal uncles died of cancer.  One paternal aunt died of lung cancer.  Multiple maternal cousins died of cancers.  Patient does not know exact types.  He will benefit from genetic evaluation and testing.

## 2019-08-09 NOTE — Progress Notes (Signed)
Patient has been assessed, vital signs and labs have been reviewed by Dr. Katragadda. ANC, Creatinine, LFTs, and Platelets are within treatment parameters per Dr. Katragadda. The patient is good to proceed with treatment at this time.  

## 2019-08-10 NOTE — Progress Notes (Signed)

## 2019-08-11 ENCOUNTER — Other Ambulatory Visit (HOSPITAL_COMMUNITY): Payer: Self-pay | Admitting: *Deleted

## 2019-08-11 MED ORDER — LIDOCAINE VISCOUS HCL 2 % MT SOLN: OROMUCOSAL | 1 refills | Status: DC

## 2019-08-13 ENCOUNTER — Telehealth (INDEPENDENT_AMBULATORY_CARE_PROVIDER_SITE_OTHER): Payer: BC Managed Care – PPO | Admitting: Thoracic Surgery (Cardiothoracic Vascular Surgery)

## 2019-08-13 ENCOUNTER — Encounter: Payer: Self-pay | Admitting: Thoracic Surgery (Cardiothoracic Vascular Surgery)

## 2019-08-13 ENCOUNTER — Other Ambulatory Visit: Payer: Self-pay

## 2019-08-13 ENCOUNTER — Ambulatory Visit (HOSPITAL_COMMUNITY): Payer: BC Managed Care – PPO

## 2019-08-13 DIAGNOSIS — C160 Malignant neoplasm of cardia: Secondary | ICD-10-CM | POA: Diagnosis not present

## 2019-08-13 NOTE — Progress Notes (Signed)
     [address]       ,[place] [address]             [phone]       Hx: GE junction adenocarcinoma, at 33-38cm, involves the proximal portion of the cardia.  TxNxM0 Dx date: 05/28/19 Dx Weight: 285, 278 Chemo start Date: 07/26/19  Radiation Start Date: 07/26/19  Steven Crane is tolerating his chemotherapy and radiation.  He is about halfway through his cycle.  He does have some burning, with swallowing.  He has lost about 6 pounds over the last 2 weeks continues to have some dysphagia with solids.  He is unable to tolerate the protein shakes because they continue to make him constipated.  Recommended that he try to schedule protein powder with the bowl of ice cream. Current Weight: 272  Plan:  I will check back with him in 2 weeks with a virtual visit.  His weight cannot drop below 256 pounds to minimize his risk of complications with surgery.

## 2019-08-13 NOTE — Progress Notes (Signed)
Nutrition Follow-up:  Patient with new diagnosis of esophageal cancer.  Patient has started radiation and chemotherapy.    Spoke with patient via phone for nutrition follow-up.  Patient reports that he is eating breakfast this am oatmeal, 2 eggs and bacon.  Reports that he has been trying to drink 2-3 premier protein shakes.  For lunch yesterday ate 2 orders of mashed potatoes, pinto beans, corn fritters.  Last night tried to eat some fried chicken but ending up getting the hiccups and was unable to eat much else.  Reports had 2 bowel movements yesterday.  Has not started taking lactulose.  Was able to eat stew beef and potatoes one day this week and macaroni and cheese.  Does not really like the higher calorie shakes.    Reports magic mouthwash is helping.    Medications: lactulose  Labs: reviewed  Anthropometrics:   Weight decreased to 272 lb decreased from 275 lb on 4/12  285 lb on 3/19   NUTRITION DIAGNOSIS:  Inadequate oral intake continues   INTERVENTION:  Patient willing to try mixing ice cream and trying higher calorie shakes, encouraged 1--2 in addition to premier protein shake (very low calorie).   Reviewed eating meats in gravy, liquid and chewing well before eating. Discussed other foods with protein if unable to eat meat.   Patient has contact information    MONITORING, EVALUATION, GOAL: Patient will consume adequate calories to prevent weight loss during treatment   NEXT VISIT: April 30 phone f/u  Sydnie Sigmund B. Zenia Resides, [place], [place] Registered Dietitian [phone] (pager)

## 2019-08-16 ENCOUNTER — Other Ambulatory Visit (HOSPITAL_COMMUNITY): Payer: BC Managed Care – PPO

## 2019-08-16 ENCOUNTER — Ambulatory Visit (HOSPITAL_COMMUNITY): Payer: BC Managed Care – PPO | Admitting: Hematology

## 2019-08-16 ENCOUNTER — Ambulatory Visit (HOSPITAL_COMMUNITY): Payer: BC Managed Care – PPO

## 2019-08-17 ENCOUNTER — Inpatient Hospital Stay (HOSPITAL_BASED_OUTPATIENT_CLINIC_OR_DEPARTMENT_OTHER): Payer: BC Managed Care – PPO | Admitting: Hematology

## 2019-08-17 ENCOUNTER — Inpatient Hospital Stay (HOSPITAL_COMMUNITY): Payer: BC Managed Care – PPO

## 2019-08-17 ENCOUNTER — Other Ambulatory Visit: Payer: Self-pay

## 2019-08-17 ENCOUNTER — Encounter (HOSPITAL_COMMUNITY): Payer: Self-pay | Admitting: Hematology

## 2019-08-17 VITALS — BP 120/72 | HR 73 | Temp 97.2°F | Resp 18

## 2019-08-17 VITALS — BP 115/65 | HR 87 | Temp 96.6°F | Resp 18 | Wt 266.5 lb

## 2019-08-17 DIAGNOSIS — C160 Malignant neoplasm of cardia: Secondary | ICD-10-CM

## 2019-08-17 LAB — COMPREHENSIVE METABOLIC PANEL
ALT: 31 U/L (ref 0–44)
AST: 25 U/L (ref 15–41)
Albumin: 3.6 g/dL (ref 3.5–5.0)
Alkaline Phosphatase: 35 U/L — ABNORMAL LOW (ref 38–126)
Anion gap: 10 (ref 5–15)
BUN: 13 mg/dL (ref 6–20)
CO2: 22 mmol/L (ref 22–32)
Calcium: 8.8 mg/dL — ABNORMAL LOW (ref 8.9–10.3)
Chloride: 102 mmol/L (ref 98–111)
Creatinine, Ser: 0.59 mg/dL — ABNORMAL LOW (ref 0.61–1.24)
GFR calc Af Amer: >60 mL/min (ref >60)
GFR calc non Af Amer: >60 mL/min (ref >60)
Glucose, Bld: 282 mg/dL — ABNORMAL HIGH (ref 70–99)
Potassium: 4.2 mmol/L (ref 3.5–5.1)
Sodium: 134 mmol/L — ABNORMAL LOW (ref 135–145)
Total Bilirubin: 0.4 mg/dL (ref 0.3–1.2)
Total Protein: 6.4 g/dL — ABNORMAL LOW (ref 6.5–8.1)

## 2019-08-17 LAB — CBC WITH DIFFERENTIAL/PLATELET
Abs Immature Granulocytes: 0.01 10*3/uL (ref 0.00–0.07)
Basophils Absolute: 0 10*3/uL (ref 0.0–0.1)
Basophils Relative: 1 %
Eosinophils Absolute: 0 10*3/uL (ref 0.0–0.5)
Eosinophils Relative: 1 %
HCT: 37 % — ABNORMAL LOW (ref 39.0–52.0)
Hemoglobin: 12.3 g/dL — ABNORMAL LOW (ref 13.0–17.0)
Immature Granulocytes: 0 %
Lymphocytes Relative: 5 %
Lymphs Abs: 0.1 10*3/uL — ABNORMAL LOW (ref 0.7–4.0)
MCH: 28.5 pg (ref 26.0–34.0)
MCHC: 33.2 g/dL (ref 30.0–36.0)
MCV: 85.8 fL (ref 80.0–100.0)
Monocytes Absolute: 0.4 10*3/uL (ref 0.1–1.0)
Monocytes Relative: 14 %
Neutro Abs: 2 10*3/uL (ref 1.7–7.7)
Neutrophils Relative %: 79 %
Platelets: 166 10*3/uL (ref 150–400)
RBC: 4.31 MIL/uL (ref 4.22–5.81)
RDW: 14.2 % (ref 11.5–15.5)
WBC: 2.6 10*3/uL — ABNORMAL LOW (ref 4.0–10.5)
nRBC: 0 % (ref 0.0–0.2)

## 2019-08-17 LAB — MAGNESIUM: Magnesium: 1.8 mg/dL (ref 1.7–2.4)

## 2019-08-17 MED ORDER — DIPHENHYDRAMINE HCL 50 MG/ML IJ SOLN
50.0000 mg | Freq: Once | INTRAMUSCULAR | Status: AC
  Administered 2019-08-17: 12:00:00 50 mg via INTRAVENOUS
  Filled 2019-08-17: qty 1

## 2019-08-17 MED ORDER — SODIUM CHLORIDE 0.9 % IV SOLN
50.0000 mg/m2 | Freq: Once | INTRAVENOUS | Status: AC
  Administered 2019-08-17: 132 mg via INTRAVENOUS
  Filled 2019-08-17: qty 22

## 2019-08-17 MED ORDER — PALONOSETRON HCL INJECTION 0.25 MG/5ML
0.2500 mg | Freq: Once | INTRAVENOUS | Status: AC
  Administered 2019-08-17: 12:00:00 0.25 mg via INTRAVENOUS
  Filled 2019-08-17: qty 5

## 2019-08-17 MED ORDER — FAMOTIDINE IN NACL 20-0.9 MG/50ML-% IV SOLN
20.0000 mg | Freq: Once | INTRAVENOUS | Status: AC
  Administered 2019-08-17: 20 mg via INTRAVENOUS
  Filled 2019-08-17: qty 50

## 2019-08-17 MED ORDER — SODIUM CHLORIDE 0.9 % IV SOLN
20.0000 mg | Freq: Once | INTRAVENOUS | Status: AC
  Administered 2019-08-17: 12:00:00 20 mg via INTRAVENOUS
  Filled 2019-08-17: qty 20

## 2019-08-17 MED ORDER — SODIUM CHLORIDE 0.9 % IV SOLN: Freq: Once | INTRAVENOUS | Status: AC

## 2019-08-17 MED ORDER — HEPARIN SOD (PORK) LOCK FLUSH 100 UNIT/ML IV SOLN
500.0000 [IU] | Freq: Once | INTRAVENOUS | Status: AC | PRN
  Administered 2019-08-17: 500 [IU]

## 2019-08-17 MED ORDER — SODIUM CHLORIDE 0.9% FLUSH
10.0000 mL | INTRAVENOUS | Status: DC | PRN
  Administered 2019-08-17: 10 mL

## 2019-08-17 MED ORDER — SODIUM CHLORIDE 0.9 % IV SOLN
300.0000 mg | Freq: Once | INTRAVENOUS | Status: AC
  Administered 2019-08-17: 300 mg via INTRAVENOUS
  Filled 2019-08-17: qty 30

## 2019-08-17 NOTE — Progress Notes (Signed)
Steven Crane, [place] [id number]   CLINIC:  Medical Oncology/Hematology  PCP:  [name], [place] [address] [phone]   REASON FOR VISIT:  Follow-up for GE junction adenocarcinoma.  CURRENT THERAPY: Chemoradiation therapy.    INTERVAL HISTORY:  Steven Crane 59 y.o. male is seen for follow-up of GE junction adenocarcinoma and tox adjustment prior to week 4 of chemotherapy.  He lost 6 pounds since last visit.  He is drinking 2 protein shakes per day.  He reports that it burns on swallowing.  He was given lidocaine and Maalox which she could not tolerate.  He also complains of 2 episodes of dizziness.  He is taking MiraLAX for constipation along with stool softener.  He has not started taking lactulose yet.  Appetite is 50%.  Energy levels are 75%.    REVIEW OF SYSTEMS:  Review of Systems  HENT:   Positive for trouble swallowing.   Respiratory: Positive for shortness of breath.   Gastrointestinal: Positive for constipation.  Neurological: Positive for dizziness.  All other systems reviewed and are negative.    PAST MEDICAL/SURGICAL HISTORY:  Past Medical History:  Diagnosis Date  . Anxiety   . Arthritis    "hands, back, knees" (08/27/2013)  . Asthma   . CHF (congestive heart failure) ([place]) 02/2010  . Chronic bronchitis ([place])    "get it q year"  . Chronic lower back pain   . Closed head injury 1975  . Coronary artery disease May 2015   s/p successful PTCA/DES x 1 to distal RCA and PTCA/DES x 1 to OM-20 Aug 2013  . Depression   . DVT (deep venous thrombosis) ([place]) 2008   "LLE"  . Esophageal cancer ([place])   . GERD (gastroesophageal reflux disease)   . Headache    hs of but no longer has problems   . History of kidney stones   . Hyperlipemia   . Hypertension   . Hypertriglyceridemia   . LV dysfunction    LVEF 40% at time of cardiac cath May 2015  . Morbid obesity ([place])   . Myocardial infarction  ([place]) 02/2010  . Psoriasis   . Type II diabetes mellitus ([place])    Past Surgical History:  Procedure Laterality Date  . BIOPSY  06/17/2019   Procedure: BIOPSY;  Surgeon: [name], MD;  Location: [facility];  Service: Endoscopy;;  . COLONOSCOPY  01/06/2012   Dr. [name]: suboptimal prep. normal exam. next colonoscopy in 10 years.   . CORONARY ANGIOPLASTY WITH STENT PLACEMENT  02/2010; 08/27/2013   "1 + 3"   . ESOPHAGOGASTRODUODENOSCOPY (EGD) WITH PROPOFOL N/A 06/17/2019   Procedure: ESOPHAGOGASTRODUODENOSCOPY (EGD) WITH PROPOFOL;  Surgeon: [name], MD;  Location: [facility];  Service: Endoscopy;  Laterality: N/A;  11:15am  . IR IMAGING GUIDED PORT INSERTION  08/03/2019  . KNEE ARTHROSCOPY Left 1981  . LEFT HEART CATHETERIZATION WITH CORONARY ANGIOGRAM N/A 08/27/2013   Procedure: LEFT HEART CATHETERIZATION WITH CORONARY ANGIOGRAM;  Surgeon: [name], MD;  Location: [facility] CATH LAB;  Service: Cardiovascular;  Laterality: N/A;  . right wrist fracture surgery     . TIBIA IM NAIL INSERTION Left 08/21/2018   Procedure: INTRAMEDULLARY (IM) NAIL TIBIAL;  Surgeon: Rod Can, MD;  Location: [facility] ORS;  Service: Orthopedics;  Laterality: Left;     SOCIAL HISTORY:  Social History   Socioeconomic History  . Marital status: Single  Spouse name: Not on file  . Number of children: 3  . Years of education: Not on file  . Highest education level: Not on file  Occupational History  . Occupation: [employer]  Tobacco Use  . Smoking status: Never Smoker  . Smokeless tobacco: Current User    Types: Snuff, Chew  . Tobacco comment: "started dipping @ age 44; quit for 5 1/2 yrs at one time; hadn't chewed in awhile"  Substance and Sexual Activity  . Alcohol use: Yes    Alcohol/week: 0.0 standard drinks    Comment: occ   . Drug use: No  . Sexual activity: Yes  Other Topics Concern  . Not on file  Social History Narrative  . Not on file   Social Determinants of Health    Financial Resource Strain: Low Risk   . Difficulty of Paying Living Expenses: Not hard at all  Food Insecurity: No Food Insecurity  . Worried About Charity fundraiser in the Last Year: Never true  . Ran Out of Food in the Last Year: Never true  Transportation Needs: No Transportation Needs  . Lack of Transportation (Medical): No  . Lack of Transportation (Non-Medical): No  Physical Activity: Insufficiently Active  . Days of Exercise per Week: 1 day  . Minutes of Exercise per Session: 20 min  Stress: No Stress Concern Present  . Feeling of Stress : Not at all  Social Connections: Slightly Isolated  . Frequency of Communication with Friends and Family: Three times a week  . Frequency of Social Gatherings with Friends and Family: Three times a week  . Attends Religious Services: 1 to 4 times per year  . Active Member of Clubs or Organizations: No  . Attends Archivist Meetings: Never  . Marital Status: Living with partner  Intimate Partner Violence: Not At Risk  . Fear of Current or Ex-Partner: No  . Emotionally Abused: No  . Physically Abused: No  . Sexually Abused: No    FAMILY HISTORY:  Family History  Problem Relation Age of Onset  . Coronary artery disease Father   . Diabetes type II Father   . Diabetes Father   . Heart attack Father   . Hypertension Mother     CURRENT MEDICATIONS:  Outpatient Encounter Medications as of 08/17/2019  Medication Sig Note  . acetaminophen (TYLENOL) 500 MG tablet Take 1,000 mg by mouth in the morning and at bedtime.    Marland Kitchen amLODipine (NORVASC) 5 MG tablet Take 1 tablet (5 mg total) by mouth daily.   Marland Kitchen aspirin EC 81 MG tablet Take 1 tablet (81 mg total) by mouth daily.   Marland Kitchen atorvastatin (LIPITOR) 40 MG tablet Take 1 tablet by mouth once daily (Patient taking differently: Take 40 mg by mouth at bedtime. )   . blood glucose meter kit and supplies Test glucose once daily. ICD 10 E11.9   . dexlansoprazole (DEXILANT) 60 MG capsule Take  1 capsule (60 mg total) by mouth daily as needed (reflux).   . enalapril (VASOTEC) 20 MG tablet Take 1 tablet (20 mg total) by mouth 2 (two) times daily.   . fenofibrate 160 MG tablet Take 1 tablet (160 mg total) by mouth daily.   Marland Kitchen glyBURIDE-metformin (GLUCOVANCE) 5-500 MG tablet Take 2 tablets by mouth 2 (two) times daily.   . metoprolol succinate (TOPROL-XL) 50 MG 24 hr tablet TAKE 1 TABLET BY MOUTH ONCE DAILY WITH OR IMMEDIATELY FOLLOWING A MEAL   . PACLITAXEL IV Inject 50  mg/m2 into the vein once a week.    . pentoxifylline (TRENTAL) 400 MG CR tablet Take 1 tablet (400 mg total) by mouth 3 (three) times daily with meals.   Marland Kitchen albuterol (PROVENTIL HFA;VENTOLIN HFA) 108 (90 Base) MCG/ACT inhaler Inhale 2 puffs into the lungs every 6 (six) hours as needed for wheezing or shortness of breath. (Patient not taking: Reported on 08/09/2019)   . albuterol (VENTOLIN HFA) 108 (90 Base) MCG/ACT inhaler INHALE 2 PUFFS BY MOUTH EVERY 6 HOURS AS NEEDED FOR WHEEZING FOR SHORTNESS OF BREATH (Patient not taking: Reported on 08/09/2019)   . ALPRAZolam (XANAX) 0.5 MG tablet TAKE ONE-HALF TO ONE TABLET BY MOUTH ONCE DAILY AS NEEDED (Patient not taking: Reported on 08/02/2019)   . Lactulose 20 GM/30ML SOLN Take 30 mLs (20 g total) by mouth at bedtime. May increase to twice a day if needed. (Patient not taking: Reported on 08/17/2019)   . lidocaine (XYLOCAINE) 2 % solution Swish and swallow 15 ml before meals as needed for mouth/throat pain (Patient not taking: Reported on 08/17/2019)   . lidocaine-prilocaine (EMLA) cream Apply a small amount to port a cath site and cover with plastic wrap one hour prior to infusion appointments (Patient not taking: Reported on 08/02/2019) 07/23/2019: Have not started yet  . prochlorperazine (COMPAZINE) 10 MG tablet Take 1 tablet (10 mg total) by mouth every 6 (six) hours as needed for nausea or vomiting. (Patient not taking: Reported on 08/09/2019) 07/23/2019: Have not started yet   No  facility-administered encounter medications on file as of 08/17/2019.    ALLERGIES:  Allergies  Allergen Reactions  . Adhesive [Tape] Rash     PHYSICAL EXAM:  ECOG Performance status: 1  Vitals:   08/17/19 1019  BP: 115/65  Pulse: 87  Resp: 18  Temp: (!) 96.6 F (35.9 C)  SpO2: 99%   Filed Weights   08/17/19 1019  Weight: 266 lb 8 oz (120.9 kg)    Physical Exam Vitals reviewed.  Constitutional:      Appearance: Normal appearance.  Cardiovascular:     Rate and Rhythm: Normal rate and regular rhythm.     Heart sounds: Normal heart sounds.  Pulmonary:     Effort: Pulmonary effort is normal.     Breath sounds: Normal breath sounds.  Abdominal:     General: There is no distension.     Palpations: Abdomen is soft. There is no mass.  Skin:    General: Skin is warm.  Neurological:     General: No focal deficit present.     Mental Status: He is alert and oriented to person, place, and time.  Psychiatric:        Mood and Affect: Mood normal.        Behavior: Behavior normal.      LABORATORY DATA:  I have reviewed the labs as listed.  CBC    Component Value Date/Time   WBC 2.6 (L) 08/17/2019 1025   RBC 4.31 08/17/2019 1025   HGB 12.3 (L) 08/17/2019 1025   HCT 37.0 (L) 08/17/2019 1025   PLT 166 08/17/2019 1025   MCV 85.8 08/17/2019 1025   MCH 28.5 08/17/2019 1025   MCHC 33.2 08/17/2019 1025   RDW 14.2 08/17/2019 1025   LYMPHSABS 0.1 (L) 08/17/2019 1025   MONOABS 0.4 08/17/2019 1025   EOSABS 0.0 08/17/2019 1025   BASOSABS 0.0 08/17/2019 1025   CMP Latest Ref Rng & Units 08/17/2019 08/09/2019 08/02/2019  Glucose 70 - 99 mg/dL 282(H) 140(H)  158(H)  BUN 6 - 20 mg/dL '13 8 14  ' Creatinine 0.61 - 1.24 mg/dL 0.59(L) 0.47(L) 0.65  Sodium 135 - 145 mmol/L 134(L) 136 134(L)  Potassium 3.5 - 5.1 mmol/L 4.2 4.4 4.6  Chloride 98 - 111 mmol/L 102 103 102  CO2 22 - 32 mmol/L '22 23 23  ' Calcium 8.9 - 10.3 mg/dL 8.8(L) 8.7(L) 8.9  Total Protein 6.5 - 8.1 g/dL 6.4(L) 6.2(L)  6.9  Total Bilirubin 0.3 - 1.2 mg/dL 0.4 0.3 0.7  Alkaline Phos 38 - 126 U/L 35(L) 34(L) 40  AST 15 - 41 U/L '25 17 21  ' ALT 0 - 44 U/L '31 25 29       ' DIAGNOSTIC IMAGING:  I have reviewed scans.     ASSESSMENT & PLAN:   Primary adenocarcinoma of gastroesophageal junction (HCC) 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 -EGD on 05/27/2019 shows distal esophageal mass extending from 33-38 cm (GE junction) with an additional 3 cm competent involving the proximal cardia of the stomach. -PET scan on 07/20/2019 shows uptake in the mass.  Gastrohepatic lymph nodes are not hypermetabolic.  No metastatic disease. -He was evaluated by Dr. Kipp Brood.  He is a potential resection candidate after chemoradiation therapy.  He was told not to drop weight below 256 pounds. -I have reviewed his labs.  White count is slightly low at 2.6.  ANC is normal.  Platelet count is 166. -He will proceed with his next treatment today.  2.  Weight loss/dysphagia: -He lost 6 pounds in the last 1 week.  He is drinking 2 protein shakes per day.  If he drinks more he gets constipated. -He also reported burning on swallowing.  He was given Maalox and lidocaine which he could not tolerate.  He will try to increase the protein shakes to maintain weight.  3.  Constipation: -He is taking stool softeners and MiraLAX.  He has not started taking lactulose.  Start using lactulose.  4.  Diabetes: -He will continue Glucovance and Levemir.  Blood sugar today is 282.  5.  Dizziness: -He complained of dizziness couple of times.  His blood pressure is 115/65. -I have told him to hold Vasotec.  6.  Family history: -Maternal aunt and 2 maternal uncles died of cancer.  One paternal aunt died of lung cancer.  Multiple maternal cousins died of cancers.  Patient does not know exact types.  He will benefit from genetic testing.      Orders placed this encounter:  Orders  Placed This Encounter  Procedures  . CBC with Differential/Platelet  . Comprehensive metabolic panel  . Lactate dehydrogenase  . Magnesium     Derek Jack, MD [place] [phone]

## 2019-08-17 NOTE — Progress Notes (Signed)
Patient has been assessed, vital signs and labs have been reviewed by Dr. Katragadda. ANC, Creatinine, LFTs, and Platelets are within treatment parameters per Dr. Katragadda. The patient is good to proceed with treatment at this time.  

## 2019-08-17 NOTE — Patient Instructions (Signed)
[facility] Discharge Instructions for Patients Receiving Chemotherapy   Beginning January 23rd 2017 lab work for the Cancer Center will be done in the  Main lab at [place] on 1st floor. If you have a lab appointment with the Cancer Center please come in thru the  Main Entrance and check in at the main information desk   Today you received the following chemotherapy agents Taxol and Carboplatin. Follow-up as scheduled. Call clinic for any questions or concerns  To help prevent nausea and vomiting after your treatment, we encourage you to take your nausea medication.   If you develop nausea and vomiting, or diarrhea that is not controlled by your medication, call the clinic.  The clinic phone number is [phone]. Office hours are Monday-Friday 8:30am-5:00pm.  BELOW ARE SYMPTOMS THAT SHOULD BE REPORTED IMMEDIATELY:  *FEVER GREATER THAN 101.0 F  *CHILLS WITH OR WITHOUT FEVER  NAUSEA AND VOMITING THAT IS NOT CONTROLLED WITH YOUR NAUSEA MEDICATION  *UNUSUAL SHORTNESS OF BREATH  *UNUSUAL BRUISING OR BLEEDING  TENDERNESS IN MOUTH AND THROAT WITH OR WITHOUT PRESENCE OF ULCERS  *URINARY PROBLEMS  *BOWEL PROBLEMS  UNUSUAL RASH Items with * indicate a potential emergency and should be followed up as soon as possible. If you have an emergency after office hours please contact your primary care physician or go to the nearest emergency department.  Please call the clinic during office hours if you have any questions or concerns.   You may also contact the Patient Navigator at [phone] should you have any questions or need assistance in obtaining follow up care.      Resources For Cancer Patients and their Caregivers ? American Cancer Society: Can assist with transportation, wigs, general needs, runs Look Good Feel Better.        1-888-227-6333 ? Cancer Care: Provides financial assistance, online support groups, medication/co-pay assistance.   1-800-813-HOPE (4673) ? [facility] Assists [place] cancer patients and their families through emotional , educational and financial support.  336-427-4357 ? [place] DSS Where to apply for food stamps, Medicaid and utility assistance. 336-342-1394 ? [facility]: Transportation to medical appointments. 336-347-2287 ? Social Security Administration: May apply for disability if have a Stage IV cancer. 336-342-7796 1-800-772-1213 ? [place] Aging, Disability and Transit Services: Assists with nutrition, care and transit needs. 336-349-2343         

## 2019-08-17 NOTE — Patient Instructions (Signed)
[place] at [facility] Discharge Instructions  You were seen today by Dr. Delton Coombes. He went over your recent lab results. Dr. Delton Coombes wants you to start taking Lactulose for constipation.  He also wants you to STOP taking Enalapril, but continue taking Metoprolol.  He will see you back in one week for labs, treatment and follow up.   Thank you for choosing [place] at [facility] to provide your oncology and hematology care.  To afford each patient quality time with our provider, please arrive at least 15 minutes before your scheduled appointment time.   If you have a lab appointment with the [place] please come in thru the  Main Entrance and check in at the main information desk  You need to re-schedule your appointment should you arrive 10 or more minutes late.  We strive to give you quality time with our providers, and arriving late affects you and other patients whose appointments are after yours.  Also, if you no show three or more times for appointments you may be dismissed from the clinic at the providers discretion.     Again, thank you for choosing [facility].  Our hope is that these requests will decrease the amount of time that you wait before being seen by our physicians.       _____________________________________________________________  Should you have questions after your visit to [facility], please contact our office at [phone] between the hours of 8:00 a.m. and 4:30 p.m.  Voicemails left after 4:00 p.m. will not be returned until the following business day.  For prescription refill requests, have your pharmacy contact our office and allow 72 hours.    Cancer Center Support Programs:   > Cancer Support Group  2nd Tuesday of the month 1pm-2pm, Journey Room

## 2019-08-17 NOTE — Patient Instructions (Signed)
[facility] at [facility] Discharge Instructions  Labs drawn from portacath today   Thank you for choosing [facility] at [facility] to provide your oncology and hematology care.  To afford each patient quality time with our provider, please arrive at least 15 minutes before your scheduled appointment time.   If you have a lab appointment with the Cancer Center please come in thru the Main Entrance and check in at the main information desk.  You need to re-schedule your appointment should you arrive 10 or more minutes late.  We strive to give you quality time with our providers, and arriving late affects you and other patients whose appointments are after yours.  Also, if you no show three or more times for appointments you may be dismissed from the clinic at the providers discretion.     Again, thank you for choosing [facility].  Our hope is that these requests will decrease the amount of time that you wait before being seen by our physicians.       _____________________________________________________________  Should you have questions after your visit to [facility], please contact our office at [phone] between the hours of 8:00 a.m. and 4:30 p.m.  Voicemails left after 4:00 p.m. will not be returned until the following business day.  For prescription refill requests, have your pharmacy contact our office and allow 72 hours.    Due to Covid, you will need to wear a mask upon entering the hospital. If you do not have a mask, a mask will be given to you at the Main Entrance upon arrival. For doctor visits, patients may have 1 support person with them. For treatment visits, patients can not have anyone with them due to social distancing guidelines and our immunocompromised population.     

## 2019-08-17 NOTE — Progress Notes (Signed)
[id number] Labs reviewed with and pt seen by Dr. Delton Coombes and pt approved for Taxol and Carboplatin infusion today per MD             Steven Crane tolerated chemo tx well without complaints or incident. VSS upon discharge. Pt discharged self ambulatory in satisfactory condition

## 2019-08-20 ENCOUNTER — Ambulatory Visit (HOSPITAL_COMMUNITY): Payer: BC Managed Care – PPO

## 2019-08-20 NOTE — Progress Notes (Signed)
Nutrition Follow-up:  Patient with new diagnosis of esophageal cancer.  Patient has started radiation and chemotherapy.    Spoke with patient via phone for nutrition follow-up.  Patient reports that his girlfriend had mini stroke on Monday of this past week and he missed chemotherapy.  Took treatment on Tuesday.  Reports that he took lactulose and had 3 bowel movements yesterday and 1 this am.  Has not been feeling good this week.  Came home from work early yesterday and not sure if he is going to work today.  Complains of headache.  Yesterday was able to eat 2 eggs with gravy and bacon. Lunch was nabs and deviled ham. Supper was mashed potatoes, white beans and rice.  Drank 3 premier protein shakes.  Has not tried higher calorie shakes anymore.  Reports some nausea and took medication with relief.    Reports that foods/liquids burn or irritate esophagus when going down.  Reports magic mouthwash makes him sick to his stomach to take it.      Medications: reviewed  Labs: reviewed  Anthropometrics:   Weight 266 lb 8 oz on 4/27 decreased from 272 lb on 4/23 285 lb on 3/19  NUTRITION DIAGNOSIS: Inadequate oral intake continues   INTERVENTION:  Encouraged small frequent meals to help with nausea.  Reviewed instructions for taking nausea medications.  Ideally, patient needs higher calorie shake reviewed ways to try it again.  Goal would be 3 premier protein and at least 1-2 higher calorie shakes Patient using gravies, sauces, condiments to increase calories and protein.  Patient has contact information    MONITORING, EVALUATION, GOAL: Patient will consume adequate calories and protein to prevent weight loss during treatment   NEXT VISIT: May 7th phone f/u  Kesia Dalto B. Zenia Resides, [place], [place] Registered Dietitian [phone] (pager)

## 2019-08-21 NOTE — Assessment & Plan Note (Signed)
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 -EGD on 05/27/2019 shows distal esophageal mass extending from 33-38 cm (GE junction) with an additional 3 cm competent involving the proximal cardia of the stomach. -PET scan on 07/20/2019 shows uptake in the mass.  Gastrohepatic lymph nodes are not hypermetabolic.  No metastatic disease. -He was evaluated by Dr. Kipp Brood.  He is a potential resection candidate after chemoradiation therapy.  He was told not to drop weight below 256 pounds. -I have reviewed his labs.  White count is slightly low at 2.6.  ANC is normal.  Platelet count is 166. -He will proceed with his next treatment today.  2.  Weight loss/dysphagia: -He lost 6 pounds in the last 1 week.  He is drinking 2 protein shakes per day.  If he drinks more he gets constipated. -He also reported burning on swallowing.  He was given Maalox and lidocaine which he could not tolerate.  He will try to increase the protein shakes to maintain weight.  3.  Constipation: -He is taking stool softeners and MiraLAX.  He has not started taking lactulose.  Start using lactulose.  4.  Diabetes: -He will continue Glucovance and Levemir.  Blood sugar today is 282.  5.  Dizziness: -He complained of dizziness couple of times.  His blood pressure is 115/65. -I have told him to hold Vasotec.  6.  Family history: -Maternal aunt and 2 maternal uncles died of cancer.  One paternal aunt died of lung cancer.  Multiple maternal cousins died of cancers.  Patient does not know exact types.  He will benefit from genetic testing.

## 2019-08-23 ENCOUNTER — Encounter (HOSPITAL_COMMUNITY): Payer: Self-pay | Admitting: Hematology

## 2019-08-23 ENCOUNTER — Other Ambulatory Visit (HOSPITAL_COMMUNITY): Payer: BC Managed Care – PPO

## 2019-08-23 ENCOUNTER — Inpatient Hospital Stay (HOSPITAL_COMMUNITY): Payer: BC Managed Care – PPO

## 2019-08-23 ENCOUNTER — Inpatient Hospital Stay (HOSPITAL_BASED_OUTPATIENT_CLINIC_OR_DEPARTMENT_OTHER): Payer: BC Managed Care – PPO | Admitting: Hematology

## 2019-08-23 ENCOUNTER — Inpatient Hospital Stay (HOSPITAL_COMMUNITY): Payer: BC Managed Care – PPO | Attending: Hematology

## 2019-08-23 ENCOUNTER — Other Ambulatory Visit: Payer: Self-pay

## 2019-08-23 VITALS — BP 132/72 | HR 78 | Temp 97.7°F | Resp 18

## 2019-08-23 DIAGNOSIS — D702 Other drug-induced agranulocytosis: Secondary | ICD-10-CM

## 2019-08-23 DIAGNOSIS — Z79899 Other long term (current) drug therapy: Secondary | ICD-10-CM | POA: Diagnosis not present

## 2019-08-23 DIAGNOSIS — Z7982 Long term (current) use of aspirin: Secondary | ICD-10-CM | POA: Diagnosis not present

## 2019-08-23 DIAGNOSIS — K219 Gastro-esophageal reflux disease without esophagitis: Secondary | ICD-10-CM | POA: Insufficient documentation

## 2019-08-23 DIAGNOSIS — F329 Major depressive disorder, single episode, unspecified: Secondary | ICD-10-CM | POA: Diagnosis not present

## 2019-08-23 DIAGNOSIS — R1110 Vomiting, unspecified: Secondary | ICD-10-CM | POA: Insufficient documentation

## 2019-08-23 DIAGNOSIS — I252 Old myocardial infarction: Secondary | ICD-10-CM | POA: Insufficient documentation

## 2019-08-23 DIAGNOSIS — Z833 Family history of diabetes mellitus: Secondary | ICD-10-CM | POA: Insufficient documentation

## 2019-08-23 DIAGNOSIS — I509 Heart failure, unspecified: Secondary | ICD-10-CM | POA: Insufficient documentation

## 2019-08-23 DIAGNOSIS — C160 Malignant neoplasm of cardia: Secondary | ICD-10-CM

## 2019-08-23 DIAGNOSIS — E785 Hyperlipidemia, unspecified: Secondary | ICD-10-CM | POA: Insufficient documentation

## 2019-08-23 DIAGNOSIS — Z86718 Personal history of other venous thrombosis and embolism: Secondary | ICD-10-CM | POA: Diagnosis not present

## 2019-08-23 DIAGNOSIS — R42 Dizziness and giddiness: Secondary | ICD-10-CM | POA: Diagnosis not present

## 2019-08-23 DIAGNOSIS — E781 Pure hyperglyceridemia: Secondary | ICD-10-CM | POA: Insufficient documentation

## 2019-08-23 DIAGNOSIS — E119 Type 2 diabetes mellitus without complications: Secondary | ICD-10-CM | POA: Diagnosis not present

## 2019-08-23 DIAGNOSIS — Z923 Personal history of irradiation: Secondary | ICD-10-CM | POA: Diagnosis not present

## 2019-08-23 DIAGNOSIS — Z8249 Family history of ischemic heart disease and other diseases of the circulatory system: Secondary | ICD-10-CM | POA: Insufficient documentation

## 2019-08-23 DIAGNOSIS — R634 Abnormal weight loss: Secondary | ICD-10-CM | POA: Diagnosis not present

## 2019-08-23 DIAGNOSIS — F419 Anxiety disorder, unspecified: Secondary | ICD-10-CM | POA: Insufficient documentation

## 2019-08-23 DIAGNOSIS — Z5189 Encounter for other specified aftercare: Secondary | ICD-10-CM | POA: Insufficient documentation

## 2019-08-23 DIAGNOSIS — K5900 Constipation, unspecified: Secondary | ICD-10-CM | POA: Insufficient documentation

## 2019-08-23 DIAGNOSIS — I110 Hypertensive heart disease with heart failure: Secondary | ICD-10-CM | POA: Insufficient documentation

## 2019-08-23 DIAGNOSIS — J449 Chronic obstructive pulmonary disease, unspecified: Secondary | ICD-10-CM | POA: Insufficient documentation

## 2019-08-23 DIAGNOSIS — Z5111 Encounter for antineoplastic chemotherapy: Secondary | ICD-10-CM | POA: Diagnosis present

## 2019-08-23 DIAGNOSIS — Z794 Long term (current) use of insulin: Secondary | ICD-10-CM | POA: Diagnosis not present

## 2019-08-23 DIAGNOSIS — R1310 Dysphagia, unspecified: Secondary | ICD-10-CM | POA: Diagnosis not present

## 2019-08-23 LAB — CBC WITH DIFFERENTIAL/PLATELET
Abs Immature Granulocytes: 0.01 10*3/uL (ref 0.00–0.07)
Basophils Absolute: 0 10*3/uL (ref 0.0–0.1)
Basophils Relative: 1 %
Eosinophils Absolute: 0 10*3/uL (ref 0.0–0.5)
Eosinophils Relative: 2 %
HCT: 37 % — ABNORMAL LOW (ref 39.0–52.0)
Hemoglobin: 12.6 g/dL — ABNORMAL LOW (ref 13.0–17.0)
Immature Granulocytes: 1 %
Lymphocytes Relative: 4 %
Lymphs Abs: 0.1 10*3/uL — ABNORMAL LOW (ref 0.7–4.0)
MCH: 29.2 pg (ref 26.0–34.0)
MCHC: 34.1 g/dL (ref 30.0–36.0)
MCV: 85.6 fL (ref 80.0–100.0)
Monocytes Absolute: 0.2 10*3/uL (ref 0.1–1.0)
Monocytes Relative: 11 %
Neutro Abs: 1.5 10*3/uL — ABNORMAL LOW (ref 1.7–7.7)
Neutrophils Relative %: 81 %
Platelets: 131 10*3/uL — ABNORMAL LOW (ref 150–400)
RBC: 4.32 MIL/uL (ref 4.22–5.81)
RDW: 14.4 % (ref 11.5–15.5)
WBC: 1.8 10*3/uL — ABNORMAL LOW (ref 4.0–10.5)
nRBC: 0 % (ref 0.0–0.2)

## 2019-08-23 LAB — COMPREHENSIVE METABOLIC PANEL
ALT: 32 U/L (ref 0–44)
AST: 26 U/L (ref 15–41)
Albumin: 3.6 g/dL (ref 3.5–5.0)
Alkaline Phosphatase: 38 U/L (ref 38–126)
Anion gap: 10 (ref 5–15)
BUN: 11 mg/dL (ref 6–20)
CO2: 22 mmol/L (ref 22–32)
Calcium: 8.9 mg/dL (ref 8.9–10.3)
Chloride: 103 mmol/L (ref 98–111)
Creatinine, Ser: 0.65 mg/dL (ref 0.61–1.24)
GFR calc Af Amer: >60 mL/min (ref >60)
GFR calc non Af Amer: >60 mL/min (ref >60)
Glucose, Bld: 226 mg/dL — ABNORMAL HIGH (ref 70–99)
Potassium: 4.2 mmol/L (ref 3.5–5.1)
Sodium: 135 mmol/L (ref 135–145)
Total Bilirubin: 0.8 mg/dL (ref 0.3–1.2)
Total Protein: 6.5 g/dL (ref 6.5–8.1)

## 2019-08-23 LAB — MAGNESIUM: Magnesium: 1.8 mg/dL (ref 1.7–2.4)

## 2019-08-23 MED ORDER — LIDOCAINE VISCOUS HCL 2 % MT SOLN
15.0000 mL | OROMUCOSAL | 2 refills | Status: DC | PRN
Start: 2019-08-23 — End: 2019-11-02

## 2019-08-23 MED ORDER — FAMOTIDINE IN NACL 20-0.9 MG/50ML-% IV SOLN
20.0000 mg | Freq: Once | INTRAVENOUS | Status: AC
  Administered 2019-08-23: 11:00:00 20 mg via INTRAVENOUS

## 2019-08-23 MED ORDER — SODIUM CHLORIDE 0.9% FLUSH
10.0000 mL | INTRAVENOUS | Status: DC | PRN
  Administered 2019-08-23: 10:00:00 10 mL

## 2019-08-23 MED ORDER — DIPHENHYDRAMINE HCL 50 MG/ML IJ SOLN
INTRAMUSCULAR | Status: AC
  Filled 2019-08-23: qty 1

## 2019-08-23 MED ORDER — HEPARIN SOD (PORK) LOCK FLUSH 100 UNIT/ML IV SOLN
500.0000 [IU] | Freq: Once | INTRAVENOUS | Status: AC | PRN
  Administered 2019-08-23: 14:00:00 500 [IU]

## 2019-08-23 MED ORDER — SODIUM CHLORIDE 0.9 % IV SOLN
300.0000 mg | Freq: Once | INTRAVENOUS | Status: AC
  Administered 2019-08-23: 14:00:00 300 mg via INTRAVENOUS
  Filled 2019-08-23: qty 30

## 2019-08-23 MED ORDER — SODIUM CHLORIDE 0.9 % IV SOLN
50.0000 mg/m2 | Freq: Once | INTRAVENOUS | Status: AC
  Administered 2019-08-23: 132 mg via INTRAVENOUS
  Filled 2019-08-23: qty 22

## 2019-08-23 MED ORDER — FAMOTIDINE IN NACL 20-0.9 MG/50ML-% IV SOLN
INTRAVENOUS | Status: AC
  Filled 2019-08-23: qty 50

## 2019-08-23 MED ORDER — SODIUM CHLORIDE 0.9 % IV SOLN
20.0000 mg | Freq: Once | INTRAVENOUS | Status: AC
  Administered 2019-08-23: 20 mg via INTRAVENOUS
  Filled 2019-08-23: qty 2

## 2019-08-23 MED ORDER — PALONOSETRON HCL INJECTION 0.25 MG/5ML
0.2500 mg | Freq: Once | INTRAVENOUS | Status: AC
  Administered 2019-08-23: 0.25 mg via INTRAVENOUS

## 2019-08-23 MED ORDER — SODIUM CHLORIDE 0.9 % IV SOLN: Freq: Once | INTRAVENOUS | Status: AC

## 2019-08-23 MED ORDER — PALONOSETRON HCL INJECTION 0.25 MG/5ML
INTRAVENOUS | Status: AC
  Filled 2019-08-23: qty 5

## 2019-08-23 MED ORDER — DIPHENHYDRAMINE HCL 50 MG/ML IJ SOLN
50.0000 mg | Freq: Once | INTRAMUSCULAR | Status: AC
  Administered 2019-08-23: 50 mg via INTRAVENOUS

## 2019-08-23 NOTE — Progress Notes (Signed)
1024 Labs reviewed with and pt seen by Dr. Delton Coombes and pt approved for Taxol and Carboplatin infusions today with Neupogen 480 mg SQ ordered for Friday 08/27/19 per MD order      Steven Crane tolerated chemo tx well without complaints or incident. VSS upon discharge. Pt discharged self ambulatory accompanied by family member

## 2019-08-23 NOTE — Patient Instructions (Signed)
[place] at [facility] Discharge Instructions  You were seen today by Dr. Delton Coombes. He went over your recent lab results. He is sending in a new prescription for lidocaine to take 10 minutes before eating to help with swallowing pain. Keep an eye on the rash and areas on your ribs. Increase your premier protein to 4 a day. He will see you back in 1 week for labs and follow up.   Thank you for choosing [place] at [facility] to provide your oncology and hematology care.  To afford each patient quality time with our provider, please arrive at least 15 minutes before your scheduled appointment time.   If you have a lab appointment with the [facility] please come in thru the  Main Entrance and check in at the main information desk  You need to re-schedule your appointment should you arrive 10 or more minutes late.  We strive to give you quality time with our providers, and arriving late affects you and other patients whose appointments are after yours.  Also, if you no show three or more times for appointments you may be dismissed from the clinic at the providers discretion.     Again, thank you for choosing [facility].  Our hope is that these requests will decrease the amount of time that you wait before being seen by our physicians.       _____________________________________________________________  Should you have questions after your visit to [facility], please contact our office at [phone] between the hours of 8:00 a.m. and 4:30 p.m.  Voicemails left after 4:00 p.m. will not be returned until the following business day.  For prescription refill requests, have your pharmacy contact our office and allow 72 hours.    Cancer Center Support Programs:   > Cancer Support Group  2nd Tuesday of the month 1pm-2pm, Journey Room

## 2019-08-23 NOTE — Patient Instructions (Signed)
[facility] Discharge Instructions for Patients Receiving Chemotherapy   Beginning January 23rd 2017 lab work for the Cancer Center will be done in the  Main lab at [place] on 1st floor. If you have a lab appointment with the Cancer Center please come in thru the  Main Entrance and check in at the main information desk   Today you received the following chemotherapy agents Taxol and Carboplatin. Follow-up as scheduled. Call clinic for any questions or concerns  To help prevent nausea and vomiting after your treatment, we encourage you to take your nausea medication.   If you develop nausea and vomiting, or diarrhea that is not controlled by your medication, call the clinic.  The clinic phone number is [phone]. Office hours are Monday-Friday 8:30am-5:00pm.  BELOW ARE SYMPTOMS THAT SHOULD BE REPORTED IMMEDIATELY:  *FEVER GREATER THAN 101.0 F  *CHILLS WITH OR WITHOUT FEVER  NAUSEA AND VOMITING THAT IS NOT CONTROLLED WITH YOUR NAUSEA MEDICATION  *UNUSUAL SHORTNESS OF BREATH  *UNUSUAL BRUISING OR BLEEDING  TENDERNESS IN MOUTH AND THROAT WITH OR WITHOUT PRESENCE OF ULCERS  *URINARY PROBLEMS  *BOWEL PROBLEMS  UNUSUAL RASH Items with * indicate a potential emergency and should be followed up as soon as possible. If you have an emergency after office hours please contact your primary care physician or go to the nearest emergency department.  Please call the clinic during office hours if you have any questions or concerns.   You may also contact the Patient Navigator at [phone] should you have any questions or need assistance in obtaining follow up care.      Resources For Cancer Patients and their Caregivers ? American Cancer Society: Can assist with transportation, wigs, general needs, runs Look Good Feel Better.        1-888-227-6333 ? Cancer Care: Provides financial assistance, online support groups, medication/co-pay assistance.   1-800-813-HOPE (4673) ? [facility] Assists [place] cancer patients and their families through emotional , educational and financial support.  336-427-4357 ? [place] DSS Where to apply for food stamps, Medicaid and utility assistance. 336-342-1394 ? [facility]: Transportation to medical appointments. 336-347-2287 ? Social Security Administration: May apply for disability if have a Stage IV cancer. 336-342-7796 1-800-772-1213 ? [place] Aging, Disability and Transit Services: Assists with nutrition, care and transit needs. 336-349-2343         

## 2019-08-23 NOTE — Progress Notes (Signed)
Patient has been assessed, vital signs and labs have been reviewed by Dr. Delton Coombes. ANC, Creatinine, LFTs, and Platelets are within treatment parameters per Dr. Delton Coombes. The patient is good to proceed with treatment at this time. Pt to come back Friday for Neupogen 480mg  per Dr. Delton Coombes.

## 2019-08-23 NOTE — Progress Notes (Signed)
Steven Crane, [place] [address]   CLINIC:  Medical Oncology/Hematology  PCP:  [name], [place] [address] [phone]   REASON FOR VISIT:  Follow-up for GE junction adenocarcinoma.  CURRENT THERAPY: Chemoradiation therapy.    INTERVAL HISTORY:  Steven Crane 59 y.o. male seen for follow-up of GE junction adenocarcinoma and toxicity assessment prior to week 5 of chemotherapy.  Appetite and energy levels are 50%.  Lost 4 pounds.  Is able to eat soft foods.  Drinking about 2 protein shakes per day.  Reports some palpation of nodules on the left lower chest wall.  No fevers or chills reported.    REVIEW OF SYSTEMS:  Review of Systems  HENT:   Positive for trouble swallowing.   Respiratory: Positive for cough and shortness of breath.   Neurological: Positive for headaches.  All other systems reviewed and are negative.    PAST MEDICAL/SURGICAL HISTORY:  Past Medical History:  Diagnosis Date  . Anxiety   . Arthritis    "hands, back, knees" (08/27/2013)  . Asthma   . CHF (congestive heart failure) ([place]) 02/2010  . Chronic bronchitis ([place])    "get it q year"  . Chronic lower back pain   . Closed head injury 1975  . Coronary artery disease May 2015   s/p successful PTCA/DES x 1 to distal RCA and PTCA/DES x 1 to OM-20 Aug 2013  . Depression   . DVT (deep venous thrombosis) ([place]) 2008   "LLE"  . Esophageal cancer ([place])   . GERD (gastroesophageal reflux disease)   . Headache    hs of but no longer has problems   . History of kidney stones   . Hyperlipemia   . Hypertension   . Hypertriglyceridemia   . LV dysfunction    LVEF 40% at time of cardiac cath May 2015  . Morbid obesity ([place])   . Myocardial infarction ([place]) 02/2010  . Psoriasis   . Type II diabetes mellitus ([place])    Past Surgical History:  Procedure Laterality Date  . BIOPSY  06/17/2019   Procedure: BIOPSY;  Surgeon: [name], MD;   Location: [facility] ENDO SUITE;  Service: Endoscopy;;  . COLONOSCOPY  01/06/2012   Dr. [name]: suboptimal prep. normal exam. next colonoscopy in 10 years.   . CORONARY ANGIOPLASTY WITH STENT PLACEMENT  02/2010; 08/27/2013   "1 + 3"   . ESOPHAGOGASTRODUODENOSCOPY (EGD) WITH PROPOFOL N/A 06/17/2019   Procedure: ESOPHAGOGASTRODUODENOSCOPY (EGD) WITH PROPOFOL;  Surgeon: [name], MD;  Location: [facility] ENDO SUITE;  Service: Endoscopy;  Laterality: N/A;  11:15am  . IR IMAGING GUIDED PORT INSERTION  08/03/2019  . KNEE ARTHROSCOPY Left 1981  . LEFT HEART CATHETERIZATION WITH CORONARY ANGIOGRAM N/A 08/27/2013   Procedure: LEFT HEART CATHETERIZATION WITH CORONARY ANGIOGRAM;  Surgeon: [name], MD;  Location: [facility] CATH LAB;  Service: Cardiovascular;  Laterality: N/A;  . right wrist fracture surgery     . TIBIA IM NAIL INSERTION Left 08/21/2018   Procedure: INTRAMEDULLARY (IM) NAIL TIBIAL;  Surgeon: Rod Can, MD;  Location: [facility] ORS;  Service: Orthopedics;  Laterality: Left;     SOCIAL HISTORY:  Social History   Socioeconomic History  . Marital status: Single    Spouse name: Not on file  . Number of children: 3  . Years of education: Not on file  . Highest education level: Not on file  Occupational History  . Occupation: [employer]  Tobacco Use  . Smoking status: Never Smoker  . Smokeless tobacco: Current User    Types: Snuff, Chew  . Tobacco comment: "started dipping @ age 79; quit for 5 1/2 yrs at one time; hadn't chewed in awhile"  Substance and Sexual Activity  . Alcohol use: Yes    Alcohol/week: 0.0 standard drinks    Comment: occ   . Drug use: No  . Sexual activity: Yes  Other Topics Concern  . Not on file  Social History Narrative  . Not on file   Social Determinants of Health   Financial Resource Strain: Low Risk   . Difficulty of Paying Living Expenses: Not hard at all  Food Insecurity: No Food Insecurity  . Worried About Charity fundraiser in the Last Year: Never true  .  Ran Out of Food in the Last Year: Never true  Transportation Needs: No Transportation Needs  . Lack of Transportation (Medical): No  . Lack of Transportation (Non-Medical): No  Physical Activity: Insufficiently Active  . Days of Exercise per Week: 1 day  . Minutes of Exercise per Session: 20 min  Stress: No Stress Concern Present  . Feeling of Stress : Not at all  Social Connections: Slightly Isolated  . Frequency of Communication with Friends and Family: Three times a week  . Frequency of Social Gatherings with Friends and Family: Three times a week  . Attends Religious Services: 1 to 4 times per year  . Active Member of Clubs or Organizations: No  . Attends Archivist Meetings: Never  . Marital Status: Living with partner  Intimate Partner Violence: Not At Risk  . Fear of Current or Ex-Partner: No  . Emotionally Abused: No  . Physically Abused: No  . Sexually Abused: No    FAMILY HISTORY:  Family History  Problem Relation Age of Onset  . Coronary artery disease Father   . Diabetes type II Father   . Diabetes Father   . Heart attack Father   . Hypertension Mother     CURRENT MEDICATIONS:  Outpatient Encounter Medications as of 08/23/2019  Medication Sig Note  . amLODipine (NORVASC) 5 MG tablet Take 1 tablet (5 mg total) by mouth daily.   Marland Kitchen aspirin EC 81 MG tablet Take 1 tablet (81 mg total) by mouth daily.   Marland Kitchen atorvastatin (LIPITOR) 40 MG tablet Take 1 tablet by mouth once daily (Patient taking differently: Take 40 mg by mouth at bedtime. )   . blood glucose meter kit and supplies Test glucose once daily. ICD 10 E11.9   . dexlansoprazole (DEXILANT) 60 MG capsule Take 1 capsule (60 mg total) by mouth daily as needed (reflux).   . enalapril (VASOTEC) 20 MG tablet Take 1 tablet (20 mg total) by mouth 2 (two) times daily.   . fenofibrate 160 MG tablet Take 1 tablet (160 mg total) by mouth daily.   Marland Kitchen glyBURIDE-metformin (GLUCOVANCE) 5-500 MG tablet Take 2 tablets by  mouth 2 (two) times daily.   . metoprolol succinate (TOPROL-XL) 50 MG 24 hr tablet TAKE 1 TABLET BY MOUTH ONCE DAILY WITH OR IMMEDIATELY FOLLOWING A MEAL   . PACLITAXEL IV Inject 50 mg/m2 into the vein once a week.    . pentoxifylline (TRENTAL) 400 MG CR tablet Take 1 tablet (400 mg total) by mouth 3 (three) times daily with meals.   Marland Kitchen acetaminophen (TYLENOL) 500 MG tablet Take 1,000 mg by mouth in the morning and at bedtime.    Marland Kitchen albuterol (  PROVENTIL HFA;VENTOLIN HFA) 108 (90 Base) MCG/ACT inhaler Inhale 2 puffs into the lungs every 6 (six) hours as needed for wheezing or shortness of breath. (Patient not taking: Reported on 08/23/2019)   . albuterol (VENTOLIN HFA) 108 (90 Base) MCG/ACT inhaler INHALE 2 PUFFS BY MOUTH EVERY 6 HOURS AS NEEDED FOR WHEEZING FOR SHORTNESS OF BREATH (Patient not taking: Reported on 08/09/2019)   . ALPRAZolam (XANAX) 0.5 MG tablet TAKE ONE-HALF TO ONE TABLET BY MOUTH ONCE DAILY AS NEEDED (Patient not taking: Reported on 08/02/2019)   . Lactulose 20 GM/30ML SOLN Take 30 mLs (20 g total) by mouth at bedtime. May increase to twice a day if needed. (Patient not taking: Reported on 08/23/2019)   . lidocaine (XYLOCAINE) 2 % solution Swish and swallow 15 ml before meals as needed for mouth/throat pain (Patient not taking: Reported on 08/17/2019)   . lidocaine (XYLOCAINE) 2 % solution Use as directed 15 mLs in the mouth or throat as needed for mouth pain.   Marland Kitchen lidocaine-prilocaine (EMLA) cream Apply a small amount to port a cath site and cover with plastic wrap one hour prior to infusion appointments (Patient not taking: Reported on 08/02/2019) 07/23/2019: Have not started yet  . prochlorperazine (COMPAZINE) 10 MG tablet Take 1 tablet (10 mg total) by mouth every 6 (six) hours as needed for nausea or vomiting. (Patient not taking: Reported on 08/09/2019) 07/23/2019: Have not started yet   No facility-administered encounter medications on file as of 08/23/2019.    ALLERGIES:  Allergies    Allergen Reactions  . Adhesive [Tape] Rash     PHYSICAL EXAM:  ECOG Performance status: 1  Vitals:   08/23/19 0952  BP: (!) 146/73  Pulse: 88  Resp: 18  Temp: (!) 97.3 F (36.3 C)  SpO2: 98%   Filed Weights   08/23/19 0952  Weight: 261 lb 12.8 oz (118.8 kg)    Physical Exam Vitals reviewed.  Constitutional:      Appearance: Normal appearance.  Cardiovascular:     Rate and Rhythm: Normal rate and regular rhythm.     Heart sounds: Normal heart sounds.  Pulmonary:     Effort: Pulmonary effort is normal.     Breath sounds: Normal breath sounds.  Abdominal:     General: There is no distension.     Palpations: Abdomen is soft. There is no mass.  Skin:    General: Skin is warm.  Neurological:     General: No focal deficit present.     Mental Status: He is alert and oriented to person, place, and time.  Psychiatric:        Mood and Affect: Mood normal.        Behavior: Behavior normal.      LABORATORY DATA:  I have reviewed the labs as listed.  CBC    Component Value Date/Time   WBC 1.8 (L) 08/23/2019 0950   RBC 4.32 08/23/2019 0950   HGB 12.6 (L) 08/23/2019 0950   HCT 37.0 (L) 08/23/2019 0950   PLT 131 (L) 08/23/2019 0950   MCV 85.6 08/23/2019 0950   MCH 29.2 08/23/2019 0950   MCHC 34.1 08/23/2019 0950   RDW 14.4 08/23/2019 0950   LYMPHSABS 0.1 (L) 08/23/2019 0950   MONOABS 0.2 08/23/2019 0950   EOSABS 0.0 08/23/2019 0950   BASOSABS 0.0 08/23/2019 0950   CMP Latest Ref Rng & Units 08/23/2019 08/17/2019 08/09/2019  Glucose 70 - 99 mg/dL 226(H) 282(H) 140(H)  BUN 6 - 20 mg/dL 11 13  8  Creatinine 0.61 - 1.24 mg/dL 0.65 0.59(L) 0.47(L)  Sodium 135 - 145 mmol/L 135 134(L) 136  Potassium 3.5 - 5.1 mmol/L 4.2 4.2 4.4  Chloride 98 - 111 mmol/L 103 102 103  CO2 22 - 32 mmol/L '22 22 23  ' Calcium 8.9 - 10.3 mg/dL 8.9 8.8(L) 8.7(L)  Total Protein 6.5 - 8.1 g/dL 6.5 6.4(L) 6.2(L)  Total Bilirubin 0.3 - 1.2 mg/dL 0.8 0.4 0.3  Alkaline Phos 38 - 126 U/L 38 35(L)  34(L)  AST 15 - 41 U/L '26 25 17  ' ALT 0 - 44 U/L 32 31 25       DIAGNOSTIC IMAGING:  I have reviewed scans.     ASSESSMENT & PLAN:   Primary adenocarcinoma of gastroesophageal junction (HCC) 1.  GE junction adenocarcinoma: -PET scan on 07/20/2019 shows uptake in the mass.  Gastrohepatic lymph nodes are not hypermetabolic.  No metastatic disease. -We reviewed his labs today.  White count is 1.8 with ANC of 1500.  Platelet count is 131.  LFTs are normal. -He will proceed with week 5 of treatment today.  I plan to give him G-CSF 480 mcg on Friday.  If his counts are good, he will likely receive last treatment next week.  He will finish his XRT next Wednesday.  2.  Weight loss/dysphagia: -He lost 4 pounds since last week.  He is drinking 2 protein shakes per day. -We had prolonged discussion today about increasing his protein shakes to 4/day.  He will use lactulose which is helping with his constipation. -He was also told to take Compazine 3 times a day as a standing dose.  3.  Constipation: -He will continue stool softeners.  He will use lactulose every day.  He is taking it every other day.  4.  Diabetes: -Continue Glucovance and Levemir.  5.  Dizziness: -Blood pressure today is 146/73.  Continue to hold Vasotec.      Orders placed this encounter:  No orders of the defined types were placed in this encounter.    Derek Jack, MD [place] [phone]

## 2019-08-23 NOTE — Assessment & Plan Note (Addendum)
1.  GE junction adenocarcinoma: -PET scan on 07/20/2019 shows uptake in the mass.  Gastrohepatic lymph nodes are not hypermetabolic.  No metastatic disease. -We reviewed his labs today.  White count is 1.8 with ANC of 1500.  Platelet count is 131.  LFTs are normal. -He will proceed with week 5 of treatment today.  I plan to give him G-CSF 480 mcg on Friday.  If his counts are good, he will likely receive last treatment next week.  He will finish his XRT next Wednesday.  2.  Weight loss/dysphagia: -He lost 4 pounds since last week.  He is drinking 2 protein shakes per day. -We had prolonged discussion today about increasing his protein shakes to 4/day.  He will use lactulose which is helping with his constipation. -He was also told to take Compazine 3 times a day as a standing dose.  3.  Constipation: -He will continue stool softeners.  He will use lactulose every day.  He is taking it every other day.  4.  Diabetes: -Continue Glucovance and Levemir.  5.  Dizziness: -Blood pressure today is 146/73.  Continue to hold Vasotec.

## 2019-08-27 ENCOUNTER — Other Ambulatory Visit: Payer: Self-pay

## 2019-08-27 ENCOUNTER — Inpatient Hospital Stay (HOSPITAL_COMMUNITY): Payer: BC Managed Care – PPO

## 2019-08-27 ENCOUNTER — Telehealth (INDEPENDENT_AMBULATORY_CARE_PROVIDER_SITE_OTHER): Payer: BC Managed Care – PPO | Admitting: Thoracic Surgery (Cardiothoracic Vascular Surgery)

## 2019-08-27 VITALS — BP 144/79 | HR 86 | Temp 96.9°F | Resp 17

## 2019-08-27 DIAGNOSIS — C160 Malignant neoplasm of cardia: Secondary | ICD-10-CM | POA: Diagnosis not present

## 2019-08-27 DIAGNOSIS — D702 Other drug-induced agranulocytosis: Secondary | ICD-10-CM

## 2019-08-27 MED ORDER — FILGRASTIM-SNDZ 480 MCG/0.8ML IJ SOSY
480.0000 ug | PREFILLED_SYRINGE | Freq: Once | INTRAMUSCULAR | Status: DC
  Filled 2019-08-27: qty 0.8

## 2019-08-27 MED ORDER — FILGRASTIM-SNDZ 480 MCG/0.8ML IJ SOSY
480.0000 ug | PREFILLED_SYRINGE | Freq: Once | INTRAMUSCULAR | Status: AC
  Administered 2019-08-27: 480 ug via SUBCUTANEOUS
  Filled 2019-08-27: qty 0.8

## 2019-08-27 NOTE — Progress Notes (Signed)
Patient tolerated Zarxio injection with no complaints voiced.  Site clean and dry with no bruising or swelling noted.  No complaints of pain.  Discharged with vital signs stable and no signs or symptoms of distress noted.   

## 2019-08-27 NOTE — Progress Notes (Signed)
Nutrition Follow-up:  Patient with new diagnosis of esophageal cancer.  Patient has started radiation and chemotherapy.    Met with patient in clinic following injection vs phone call.  Patient reports that he continues to have issues with constipation that is effecting intake.  Reports that he took and enema to help with constipation.  Also reports burning when foods, liquids going down esophagus.  Reports that he tried the very vanilla boost plus and was able to tolerate it but still not crazy about the taste.    Medications: reviewed  Labs: reviewed  Anthropometrics:   Weight 261 lb 12.8 oz on 5/3 decreased from 266 lb on 4/27  272 lb on 4/23 285 lb on 3/19  NUTRITION DIAGNOSIS: Inadequate oral intake continues   INTERVENTION:  Encouraged patient to take constipation lactulose daily.  Patient tried [employer] shake in clinic and did not like it (higher calories).   Provided coupons for boost plus shakes.  Discussed importance of maintaining weight in preparation for surgery.  Patient states that when he gets done with chemo and can eat again weight will come back on.  Patient doing all he can do at this time.  "If this chemo don't work, I am not doing this again."   Patient has contact information    MONITORING, EVALUATION, GOAL: Patient will consume adequate calories and protein to prevent weight loss   NEXT VISIT: May 14th phone f/u  Cassy Sprowl B. Zenia Resides, [place], [place] Registered Dietitian [phone] (pager)

## 2019-08-27 NOTE — Progress Notes (Addendum)
      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 [id number] Dx date:05/28/19 Dx NU:[id number], 9 Chemo start Date:07/26/19 Radiation Start Date:07/26/19  Chemo completion date: 08/30/19 Radiation completion date: 09/01/19  Updates: He continues to have constipation and has lost more weight.  He is looking forward to the completion of his chemoradiation.   Plan: His last radiation dose is scheduled for Wednesday the 12th, we will plan for him to undergo a PET/CT 1 month after that.  We are looking towards the end of June for surgical resection.  He will need to recover a little bit more weight prior to surgery.      Current Weight:260  This was a 17min phone call visit

## 2019-08-30 ENCOUNTER — Other Ambulatory Visit: Payer: Self-pay

## 2019-08-30 ENCOUNTER — Inpatient Hospital Stay (HOSPITAL_COMMUNITY): Payer: BC Managed Care – PPO

## 2019-08-30 ENCOUNTER — Inpatient Hospital Stay (HOSPITAL_BASED_OUTPATIENT_CLINIC_OR_DEPARTMENT_OTHER): Payer: BC Managed Care – PPO | Admitting: Hematology

## 2019-08-30 ENCOUNTER — Other Ambulatory Visit (HOSPITAL_COMMUNITY): Payer: BC Managed Care – PPO

## 2019-08-30 VITALS — BP 134/77 | HR 82 | Temp 97.1°F | Resp 18

## 2019-08-30 DIAGNOSIS — C160 Malignant neoplasm of cardia: Secondary | ICD-10-CM

## 2019-08-30 LAB — CBC WITH DIFFERENTIAL/PLATELET
Abs Immature Granulocytes: 0.03 10*3/uL (ref 0.00–0.07)
Basophils Absolute: 0 10*3/uL (ref 0.0–0.1)
Basophils Relative: 1 %
Eosinophils Absolute: 0 10*3/uL (ref 0.0–0.5)
Eosinophils Relative: 1 %
HCT: 37.1 % — ABNORMAL LOW (ref 39.0–52.0)
Hemoglobin: 12.7 g/dL — ABNORMAL LOW (ref 13.0–17.0)
Immature Granulocytes: 1 %
Lymphocytes Relative: 3 %
Lymphs Abs: 0.1 10*3/uL — ABNORMAL LOW (ref 0.7–4.0)
MCH: 29.3 pg (ref 26.0–34.0)
MCHC: 34.2 g/dL (ref 30.0–36.0)
MCV: 85.5 fL (ref 80.0–100.0)
Monocytes Absolute: 0.4 10*3/uL (ref 0.1–1.0)
Monocytes Relative: 14 %
Neutro Abs: 2.5 10*3/uL (ref 1.7–7.7)
Neutrophils Relative %: 80 %
Platelets: 134 10*3/uL — ABNORMAL LOW (ref 150–400)
RBC: 4.34 MIL/uL (ref 4.22–5.81)
RDW: 14.8 % (ref 11.5–15.5)
WBC: 3 10*3/uL — ABNORMAL LOW (ref 4.0–10.5)
nRBC: 0 % (ref 0.0–0.2)

## 2019-08-30 LAB — MAGNESIUM: Magnesium: 1.8 mg/dL (ref 1.7–2.4)

## 2019-08-30 LAB — COMPREHENSIVE METABOLIC PANEL
ALT: 30 U/L (ref 0–44)
AST: 23 U/L (ref 15–41)
Albumin: 3.5 g/dL (ref 3.5–5.0)
Alkaline Phosphatase: 44 U/L (ref 38–126)
Anion gap: 9 (ref 5–15)
BUN: 11 mg/dL (ref 6–20)
CO2: 22 mmol/L (ref 22–32)
Calcium: 8.8 mg/dL — ABNORMAL LOW (ref 8.9–10.3)
Chloride: 101 mmol/L (ref 98–111)
Creatinine, Ser: 0.48 mg/dL — ABNORMAL LOW (ref 0.61–1.24)
GFR calc Af Amer: >60 mL/min (ref >60)
GFR calc non Af Amer: >60 mL/min (ref >60)
Glucose, Bld: 214 mg/dL — ABNORMAL HIGH (ref 70–99)
Potassium: 3.9 mmol/L (ref 3.5–5.1)
Sodium: 132 mmol/L — ABNORMAL LOW (ref 135–145)
Total Bilirubin: 0.6 mg/dL (ref 0.3–1.2)
Total Protein: 6.4 g/dL — ABNORMAL LOW (ref 6.5–8.1)

## 2019-08-30 MED ORDER — SODIUM CHLORIDE 0.9 % IV SOLN: Freq: Once | INTRAVENOUS | Status: AC

## 2019-08-30 MED ORDER — SODIUM CHLORIDE 0.9 % IV SOLN
20.0000 mg | Freq: Once | INTRAVENOUS | Status: AC
  Administered 2019-08-30: 20 mg via INTRAVENOUS
  Filled 2019-08-30: qty 2

## 2019-08-30 MED ORDER — SODIUM CHLORIDE 0.9 % IV SOLN
50.0000 mg/m2 | Freq: Once | INTRAVENOUS | Status: AC
  Administered 2019-08-30: 132 mg via INTRAVENOUS
  Filled 2019-08-30: qty 22

## 2019-08-30 MED ORDER — FAMOTIDINE IN NACL 20-0.9 MG/50ML-% IV SOLN
20.0000 mg | Freq: Once | INTRAVENOUS | Status: AC
  Administered 2019-08-30: 20 mg via INTRAVENOUS
  Filled 2019-08-30: qty 50

## 2019-08-30 MED ORDER — SODIUM CHLORIDE 0.9% FLUSH
10.0000 mL | INTRAVENOUS | Status: DC | PRN
  Administered 2019-08-30: 10 mL

## 2019-08-30 MED ORDER — SODIUM CHLORIDE 0.9 % IV SOLN
300.0000 mg | Freq: Once | INTRAVENOUS | Status: AC
  Administered 2019-08-30: 300 mg via INTRAVENOUS
  Filled 2019-08-30: qty 30

## 2019-08-30 MED ORDER — HEPARIN SOD (PORK) LOCK FLUSH 100 UNIT/ML IV SOLN
500.0000 [IU] | Freq: Once | INTRAVENOUS | Status: AC | PRN
  Administered 2019-08-30: 500 [IU]

## 2019-08-30 MED ORDER — PALONOSETRON HCL INJECTION 0.25 MG/5ML
0.2500 mg | Freq: Once | INTRAVENOUS | Status: AC
  Administered 2019-08-30: 0.25 mg via INTRAVENOUS
  Filled 2019-08-30: qty 5

## 2019-08-30 MED ORDER — DIPHENHYDRAMINE HCL 50 MG/ML IJ SOLN
50.0000 mg | Freq: Once | INTRAMUSCULAR | Status: AC
  Administered 2019-08-30: 50 mg via INTRAVENOUS
  Filled 2019-08-30: qty 1

## 2019-08-30 NOTE — Patient Instructions (Addendum)
[facility] at [facility] Discharge Instructions  You were seen today by Dr. Katragadda. He went over your recent lab results. He will see you back in 1 week for labs and follow up.   Thank you for choosing [facility] at [facility] to provide your oncology and hematology care.  To afford each patient quality time with our provider, please arrive at least 15 minutes before your scheduled appointment time.   If you have a lab appointment with the Cancer Center please come in thru the  Main Entrance and check in at the main information desk  You need to re-schedule your appointment should you arrive 10 or more minutes late.  We strive to give you quality time with our providers, and arriving late affects you and other patients whose appointments are after yours.  Also, if you no show three or more times for appointments you may be dismissed from the clinic at the providers discretion.     Again, thank you for choosing [facility].  Our hope is that these requests will decrease the amount of time that you wait before being seen by our physicians.       _____________________________________________________________  Should you have questions after your visit to [facility], please contact our office at [phone] between the hours of 8:00 a.m. and 4:30 p.m.  Voicemails left after 4:00 p.m. will not be returned until the following business day.  For prescription refill requests, have your pharmacy contact our office and allow 72 hours.    Cancer Center Support Programs:   > Cancer Support Group  2nd Tuesday of the month 1pm-2pm, Journey Room    

## 2019-08-30 NOTE — Progress Notes (Signed)
Steven Crane, [place] [address]   CLINIC:  Medical Oncology/Hematology  PCP:  [name], [place] [address] [phone]   REASON FOR VISIT:  Follow-up for GE junction adenocarcinoma.  CURRENT THERAPY: Chemoradiation therapy.    INTERVAL HISTORY:  Steven Crane 59 y.o. male seen for follow-up of GE junction adenocarcinoma and toxicity assessment prior to week 6 of chemotherapy.  Appetite is 50%.  Energy levels are 50%.  Lost another 9 pounds.  Had 1 episode of nosebleed.  Constipation is managed reasonably.  Occasional headaches stable.   REVIEW OF SYSTEMS:  Review of Systems  HENT:   Positive for trouble swallowing.   Respiratory: Positive for cough and shortness of breath.   Gastrointestinal: Positive for constipation and nausea.  Neurological: Positive for headaches.  All other systems reviewed and are negative.    PAST MEDICAL/SURGICAL HISTORY:  Past Medical History:  Diagnosis Date  . Anxiety   . Arthritis    "hands, back, knees" (08/27/2013)  . Asthma   . CHF (congestive heart failure) ([place]) 02/2010  . Chronic bronchitis ([place])    "get it q year"  . Chronic lower back pain   . Closed head injury 1975  . Coronary artery disease May 2015   s/p successful PTCA/DES x 1 to distal RCA and PTCA/DES x 1 to OM-20 Aug 2013  . Depression   . DVT (deep venous thrombosis) ([place]) 2008   "LLE"  . Esophageal cancer ([place])   . GERD (gastroesophageal reflux disease)   . Headache    hs of but no longer has problems   . History of kidney stones   . Hyperlipemia   . Hypertension   . Hypertriglyceridemia   . LV dysfunction    LVEF 40% at time of cardiac cath May 2015  . Morbid obesity ([place])   . Myocardial infarction ([place]) 02/2010  . Psoriasis   . Type II diabetes mellitus ([place])    Past Surgical History:  Procedure Laterality Date  . BIOPSY  06/17/2019   Procedure: BIOPSY;  Surgeon: [name], MD;   Location: [facility];  Service: Endoscopy;;  . COLONOSCOPY  01/06/2012   Dr. [name]: suboptimal prep. normal exam. next colonoscopy in 10 years.   . CORONARY ANGIOPLASTY WITH STENT PLACEMENT  02/2010; 08/27/2013   "1 + 3"   . ESOPHAGOGASTRODUODENOSCOPY (EGD) WITH PROPOFOL N/A 06/17/2019   Procedure: ESOPHAGOGASTRODUODENOSCOPY (EGD) WITH PROPOFOL;  Surgeon: [name], MD;  Location: [facility];  Service: Endoscopy;  Laterality: N/A;  11:15am  . IR IMAGING GUIDED PORT INSERTION  08/03/2019  . KNEE ARTHROSCOPY Left 1981  . LEFT HEART CATHETERIZATION WITH CORONARY ANGIOGRAM N/A 08/27/2013   Procedure: LEFT HEART CATHETERIZATION WITH CORONARY ANGIOGRAM;  Surgeon: [name], MD;  Location: [facility] CATH LAB;  Service: Cardiovascular;  Laterality: N/A;  . right wrist fracture surgery     . TIBIA IM NAIL INSERTION Left 08/21/2018   Procedure: INTRAMEDULLARY (IM) NAIL TIBIAL;  Surgeon: Rod Can, MD;  Location: [facility] ORS;  Service: Orthopedics;  Laterality: Left;     SOCIAL HISTORY:  Social History   Socioeconomic History  . Marital status: Single    Spouse name: Not on file  . Number of children: 3  . Years of education: Not on file  . Highest education level: Not on file  Occupational History  . Occupation: [employer]  Tobacco Use  . Smoking status: Never Smoker  .  Smokeless tobacco: Current User    Types: Snuff, Chew  . Tobacco comment: "started dipping @ age 26; quit for 5 1/2 yrs at one time; hadn't chewed in awhile"  Substance and Sexual Activity  . Alcohol use: Yes    Alcohol/week: 0.0 standard drinks    Comment: occ   . Drug use: No  . Sexual activity: Yes  Other Topics Concern  . Not on file  Social History Narrative  . Not on file   Social Determinants of Health   Financial Resource Strain: Low Risk   . Difficulty of Paying Living Expenses: Not hard at all  Food Insecurity: No Food Insecurity  . Worried About Charity fundraiser in the Last Year: Never true  .  Ran Out of Food in the Last Year: Never true  Transportation Needs: No Transportation Needs  . Lack of Transportation (Medical): No  . Lack of Transportation (Non-Medical): No  Physical Activity: Insufficiently Active  . Days of Exercise per Week: 1 day  . Minutes of Exercise per Session: 20 min  Stress: No Stress Concern Present  . Feeling of Stress : Not at all  Social Connections: Slightly Isolated  . Frequency of Communication with Friends and Family: Three times a week  . Frequency of Social Gatherings with Friends and Family: Three times a week  . Attends Religious Services: 1 to 4 times per year  . Active Member of Clubs or Organizations: No  . Attends Archivist Meetings: Never  . Marital Status: Living with partner  Intimate Partner Violence: Not At Risk  . Fear of Current or Ex-Partner: No  . Emotionally Abused: No  . Physically Abused: No  . Sexually Abused: No    FAMILY HISTORY:  Family History  Problem Relation Age of Onset  . Coronary artery disease Father   . Diabetes type II Father   . Diabetes Father   . Heart attack Father   . Hypertension Mother     CURRENT MEDICATIONS:  Outpatient Encounter Medications as of 08/30/2019  Medication Sig  . amLODipine (NORVASC) 5 MG tablet Take 1 tablet (5 mg total) by mouth daily.  Marland Kitchen aspirin EC 81 MG tablet Take 1 tablet (81 mg total) by mouth daily.  . blood glucose meter kit and supplies Test glucose once daily. ICD 10 E11.9  . enalapril (VASOTEC) 20 MG tablet Take 1 tablet (20 mg total) by mouth 2 (two) times daily.  . fenofibrate 160 MG tablet Take 1 tablet (160 mg total) by mouth daily.  Marland Kitchen glyBURIDE-metformin (GLUCOVANCE) 5-500 MG tablet Take 2 tablets by mouth 2 (two) times daily.  . Lactulose 20 GM/30ML SOLN Take 30 mLs (20 g total) by mouth at bedtime. May increase to twice a day if needed.  . lidocaine-prilocaine (EMLA) cream Apply a small amount to port a cath site and cover with plastic wrap one hour  prior to infusion appointments  . metoprolol succinate (TOPROL-XL) 50 MG 24 hr tablet TAKE 1 TABLET BY MOUTH ONCE DAILY WITH OR IMMEDIATELY FOLLOWING A MEAL  . PACLITAXEL IV Inject 50 mg/m2 into the vein once a week.   . pentoxifylline (TRENTAL) 400 MG CR tablet Take 1 tablet (400 mg total) by mouth 3 (three) times daily with meals.  Marland Kitchen acetaminophen (TYLENOL) 500 MG tablet Take 1,000 mg by mouth in the morning and at bedtime.   Marland Kitchen albuterol (PROVENTIL HFA;VENTOLIN HFA) 108 (90 Base) MCG/ACT inhaler Inhale 2 puffs into the lungs every 6 (six) hours  as needed for wheezing or shortness of breath. (Patient not taking: Reported on 08/30/2019)  . albuterol (VENTOLIN HFA) 108 (90 Base) MCG/ACT inhaler INHALE 2 PUFFS BY MOUTH EVERY 6 HOURS AS NEEDED FOR WHEEZING FOR SHORTNESS OF BREATH (Patient not taking: Reported on 08/30/2019)  . ALPRAZolam (XANAX) 0.5 MG tablet TAKE ONE-HALF TO ONE TABLET BY MOUTH ONCE DAILY AS NEEDED (Patient not taking: Reported on 08/30/2019)  . atorvastatin (LIPITOR) 40 MG tablet Take 1 tablet by mouth once daily (Patient not taking: No sig reported)  . dexlansoprazole (DEXILANT) 60 MG capsule Take 1 capsule (60 mg total) by mouth daily as needed (reflux). (Patient not taking: Reported on 08/30/2019)  . lidocaine (XYLOCAINE) 2 % solution Swish and swallow 15 ml before meals as needed for mouth/throat pain (Patient not taking: Reported on 08/30/2019)  . lidocaine (XYLOCAINE) 2 % solution Use as directed 15 mLs in the mouth or throat as needed for mouth pain. (Patient not taking: Reported on 08/30/2019)  . lidocaine (XYLOCAINE) 2 % solution 15 mL.  . prochlorperazine (COMPAZINE) 10 MG tablet Take 1 tablet (10 mg total) by mouth every 6 (six) hours as needed for nausea or vomiting. (Patient not taking: Reported on 08/30/2019)   No facility-administered encounter medications on file as of 08/30/2019.    ALLERGIES:  Allergies  Allergen Reactions  . Adhesive [Tape] Rash     PHYSICAL EXAM:    ECOG Performance status: 1  Vitals:   08/30/19 1000  BP: 127/69  Pulse: 85  Resp: 18  Temp: 97.9 F (36.6 C)  SpO2: 100%   Filed Weights   08/30/19 1000  Weight: 252 lb (114.3 kg)    Physical Exam Vitals reviewed.  Constitutional:      Appearance: Normal appearance.  Cardiovascular:     Rate and Rhythm: Normal rate and regular rhythm.     Heart sounds: Normal heart sounds.  Pulmonary:     Effort: Pulmonary effort is normal.     Breath sounds: Normal breath sounds.  Abdominal:     General: There is no distension.     Palpations: Abdomen is soft. There is no mass.  Skin:    General: Skin is warm.  Neurological:     General: No focal deficit present.     Mental Status: He is alert and oriented to person, place, and time.  Psychiatric:        Mood and Affect: Mood normal.        Behavior: Behavior normal.      LABORATORY DATA:  I have reviewed the labs as listed.  CBC    Component Value Date/Time   WBC 3.0 (L) 08/30/2019 0940   RBC 4.34 08/30/2019 0940   HGB 12.7 (L) 08/30/2019 0940   HCT 37.1 (L) 08/30/2019 0940   PLT 134 (L) 08/30/2019 0940   MCV 85.5 08/30/2019 0940   MCH 29.3 08/30/2019 0940   MCHC 34.2 08/30/2019 0940   RDW 14.8 08/30/2019 0940   LYMPHSABS 0.1 (L) 08/30/2019 0940   MONOABS 0.4 08/30/2019 0940   EOSABS 0.0 08/30/2019 0940   BASOSABS 0.0 08/30/2019 0940   CMP Latest Ref Rng & Units 08/30/2019 08/23/2019 08/17/2019  Glucose 70 - 99 mg/dL 214(H) 226(H) 282(H)  BUN 6 - 20 mg/dL '11 11 13  ' Creatinine 0.61 - 1.24 mg/dL 0.48(L) 0.65 0.59(L)  Sodium 135 - 145 mmol/L 132(L) 135 134(L)  Potassium 3.5 - 5.1 mmol/L 3.9 4.2 4.2  Chloride 98 - 111 mmol/L 101 103 102  CO2  22 - 32 mmol/L '22 22 22  ' Calcium 8.9 - 10.3 mg/dL 8.8(L) 8.9 8.8(L)  Total Protein 6.5 - 8.1 g/dL 6.4(L) 6.5 6.4(L)  Total Bilirubin 0.3 - 1.2 mg/dL 0.6 0.8 0.4  Alkaline Phos 38 - 126 U/L 44 38 35(L)  AST 15 - 41 U/L '23 26 25  ' ALT 0 - 44 U/L 30 32 31       DIAGNOSTIC  IMAGING:  I have reviewed scans.     ASSESSMENT & PLAN:   Primary adenocarcinoma of gastroesophageal junction (HCC) 1.  GE junction adenocarcinoma: -PET scan on 07/20/2019 showed uptake in the mass.  Gastro hepatic lymph nodes are not hypermetabolic.  No metastatic disease. -Chemoradiation therapy started on 07/26/2019. -He received Neupogen injection on Friday.  White count is 3.0 with normal ANC.  LFTs are normal. -He will proceed with his final cycle of chemotherapy today.  We will reevaluate him in 1 week for blood counts. -We will plan to do PET scan in 4 to 5 weeks.  He will finish radiation this Wednesday.  2.  Weight loss/dysphagia: -He lost another 9 pounds since last 1 week.  He is drinking 2 protein shakes per day. -He has been unable to increase the protein shakes because of constipation.  He is not able to tolerate Magic mouthwash and lidocaine. -We will give samples of boost clear and asked him to try it.  3.  Constipation: -Continue stool softeners and lactulose.  4.  Diabetes: -Continue Glucovance and Levemir.  5.  Hypertension: -Blood pressure is 127/69.  Continue to hold Vasotec.      Orders placed this encounter:  No orders of the defined types were placed in this encounter.    Derek Jack, MD [place] [phone]

## 2019-08-30 NOTE — Patient Instructions (Signed)
[facility] Discharge Instructions for Patients Receiving Chemotherapy   Beginning January 23rd 2017 lab work for the Cancer Center will be done in the  Main lab at [place] on 1st floor. If you have a lab appointment with the Cancer Center please come in thru the  Main Entrance and check in at the main information desk   Today you received the following chemotherapy agents Taxol and Carboplatin. Follow-up as scheduled. Call clinic for any questions or concerns  To help prevent nausea and vomiting after your treatment, we encourage you to take your nausea medication.   If you develop nausea and vomiting, or diarrhea that is not controlled by your medication, call the clinic.  The clinic phone number is [phone]. Office hours are Monday-Friday 8:30am-5:00pm.  BELOW ARE SYMPTOMS THAT SHOULD BE REPORTED IMMEDIATELY:  *FEVER GREATER THAN 101.0 F  *CHILLS WITH OR WITHOUT FEVER  NAUSEA AND VOMITING THAT IS NOT CONTROLLED WITH YOUR NAUSEA MEDICATION  *UNUSUAL SHORTNESS OF BREATH  *UNUSUAL BRUISING OR BLEEDING  TENDERNESS IN MOUTH AND THROAT WITH OR WITHOUT PRESENCE OF ULCERS  *URINARY PROBLEMS  *BOWEL PROBLEMS  UNUSUAL RASH Items with * indicate a potential emergency and should be followed up as soon as possible. If you have an emergency after office hours please contact your primary care physician or go to the nearest emergency department.  Please call the clinic during office hours if you have any questions or concerns.   You may also contact the Patient Navigator at [phone] should you have any questions or need assistance in obtaining follow up care.      Resources For Cancer Patients and their Caregivers ? American Cancer Society: Can assist with transportation, wigs, general needs, runs Look Good Feel Better.        1-888-227-6333 ? Cancer Care: Provides financial assistance, online support groups, medication/co-pay assistance.   1-800-813-HOPE (4673) ? [facility] Assists [place] cancer patients and their families through emotional , educational and financial support.  336-427-4357 ? [place] DSS Where to apply for food stamps, Medicaid and utility assistance. 336-342-1394 ? [facility]: Transportation to medical appointments. 336-347-2287 ? Social Security Administration: May apply for disability if have a Stage IV cancer. 336-342-7796 1-800-772-1213 ? [place] Aging, Disability and Transit Services: Assists with nutrition, care and transit needs. 336-349-2343         

## 2019-08-30 NOTE — Assessment & Plan Note (Addendum)
1.  GE junction adenocarcinoma: -PET scan on 07/20/2019 showed uptake in the mass.  Gastro hepatic lymph nodes are not hypermetabolic.  No metastatic disease. -Chemoradiation therapy started on 07/26/2019. -He received Neupogen injection on Friday.  White count is 3.0 with normal ANC.  LFTs are normal. -He will proceed with his final cycle of chemotherapy today.  We will reevaluate him in 1 week for blood counts. -We will plan to do PET scan in 4 to 5 weeks.  He will finish radiation this Wednesday.  2.  Weight loss/dysphagia: -He lost another 9 pounds since last 1 week.  He is drinking 2 protein shakes per day. -He has been unable to increase the protein shakes because of constipation.  He is not able to tolerate Magic mouthwash and lidocaine. -We will give samples of boost clear and asked him to try it.  3.  Constipation: -Continue stool softeners and lactulose.  4.  Diabetes: -Continue Glucovance and Levemir.  5.  Hypertension: -Blood pressure is 127/69.  Continue to hold Vasotec.

## 2019-08-30 NOTE — Progress Notes (Signed)
1103 Labs reviewed with and pt seen by Dr. Delton Coombes and pt approved for Taxol and Carboplatin infusions today per MD           Steven Crane tolerated chemo tx well without complaints or incident. VSS upon discharge. Pt discharged self ambulatory in satisfactory condition accompanied by family member

## 2019-09-03 ENCOUNTER — Encounter (HOSPITAL_COMMUNITY): Payer: Self-pay

## 2019-09-03 ENCOUNTER — Encounter (HOSPITAL_COMMUNITY): Payer: BC Managed Care – PPO

## 2019-09-03 NOTE — Progress Notes (Signed)
Nutrition  Patient was schedule for phone nutrition follow-up visit today.  No answer.  Left message on voicemail with call back number.   Ethell Blatchford B. Zenia Resides, JAARS, [place] Registered Dietitian [phone] (pager)

## 2019-09-06 ENCOUNTER — Inpatient Hospital Stay (HOSPITAL_BASED_OUTPATIENT_CLINIC_OR_DEPARTMENT_OTHER): Payer: BC Managed Care – PPO | Admitting: Hematology

## 2019-09-06 ENCOUNTER — Other Ambulatory Visit (HOSPITAL_COMMUNITY): Payer: Self-pay | Admitting: *Deleted

## 2019-09-06 ENCOUNTER — Ambulatory Visit: Payer: BC Managed Care – PPO | Admitting: Urology

## 2019-09-06 ENCOUNTER — Other Ambulatory Visit: Payer: Self-pay

## 2019-09-06 ENCOUNTER — Inpatient Hospital Stay (HOSPITAL_COMMUNITY): Payer: BC Managed Care – PPO

## 2019-09-06 ENCOUNTER — Encounter (HOSPITAL_COMMUNITY): Payer: Self-pay | Admitting: Hematology

## 2019-09-06 VITALS — BP 124/79 | HR 88 | Temp 97.2°F | Resp 18

## 2019-09-06 DIAGNOSIS — C160 Malignant neoplasm of cardia: Secondary | ICD-10-CM | POA: Diagnosis not present

## 2019-09-06 DIAGNOSIS — D702 Other drug-induced agranulocytosis: Secondary | ICD-10-CM

## 2019-09-06 DIAGNOSIS — R110 Nausea: Secondary | ICD-10-CM

## 2019-09-06 LAB — CBC WITH DIFFERENTIAL/PLATELET
Abs Immature Granulocytes: 0.07 10*3/uL (ref 0.00–0.07)
Basophils Absolute: 0 10*3/uL (ref 0.0–0.1)
Basophils Relative: 1 %
Eosinophils Absolute: 0 10*3/uL (ref 0.0–0.5)
Eosinophils Relative: 1 %
HCT: 38.6 % — ABNORMAL LOW (ref 39.0–52.0)
Hemoglobin: 13.5 g/dL (ref 13.0–17.0)
Immature Granulocytes: 3 %
Lymphocytes Relative: 7 %
Lymphs Abs: 0.2 10*3/uL — ABNORMAL LOW (ref 0.7–4.0)
MCH: 29.7 pg (ref 26.0–34.0)
MCHC: 35 g/dL (ref 30.0–36.0)
MCV: 84.8 fL (ref 80.0–100.0)
Monocytes Absolute: 0.4 10*3/uL (ref 0.1–1.0)
Monocytes Relative: 18 %
Neutro Abs: 1.6 10*3/uL — ABNORMAL LOW (ref 1.7–7.7)
Neutrophils Relative %: 70 %
Platelets: 145 10*3/uL — ABNORMAL LOW (ref 150–400)
RBC: 4.55 MIL/uL (ref 4.22–5.81)
RDW: 15.5 % (ref 11.5–15.5)
WBC: 2.3 10*3/uL — ABNORMAL LOW (ref 4.0–10.5)
nRBC: 0 % (ref 0.0–0.2)

## 2019-09-06 LAB — COMPREHENSIVE METABOLIC PANEL
ALT: 35 U/L (ref 0–44)
AST: 25 U/L (ref 15–41)
Albumin: 3.5 g/dL (ref 3.5–5.0)
Alkaline Phosphatase: 58 U/L (ref 38–126)
Anion gap: 10 (ref 5–15)
BUN: 13 mg/dL (ref 6–20)
CO2: 22 mmol/L (ref 22–32)
Calcium: 8.7 mg/dL — ABNORMAL LOW (ref 8.9–10.3)
Chloride: 96 mmol/L — ABNORMAL LOW (ref 98–111)
Creatinine, Ser: 0.66 mg/dL (ref 0.61–1.24)
GFR calc Af Amer: >60 mL/min (ref >60)
GFR calc non Af Amer: >60 mL/min (ref >60)
Glucose, Bld: 252 mg/dL — ABNORMAL HIGH (ref 70–99)
Potassium: 4.2 mmol/L (ref 3.5–5.1)
Sodium: 128 mmol/L — ABNORMAL LOW (ref 135–145)
Total Bilirubin: 0.6 mg/dL (ref 0.3–1.2)
Total Protein: 6.8 g/dL (ref 6.5–8.1)

## 2019-09-06 LAB — LACTATE DEHYDROGENASE: LDH: 161 U/L (ref 98–192)

## 2019-09-06 LAB — MAGNESIUM: Magnesium: 1.7 mg/dL (ref 1.7–2.4)

## 2019-09-06 MED ORDER — SODIUM CHLORIDE 0.9% FLUSH
10.0000 mL | INTRAVENOUS | Status: DC | PRN
  Administered 2019-09-06: 10 mL via INTRAVENOUS

## 2019-09-06 MED ORDER — ONDANSETRON 8 MG PO TBDP
8.0000 mg | ORAL_TABLET | Freq: Three times a day (TID) | ORAL | 2 refills | Status: DC | PRN
Start: 2019-09-06 — End: 2019-11-02

## 2019-09-06 MED ORDER — SODIUM CHLORIDE 0.9 % IV SOLN
Freq: Once | INTRAVENOUS | Status: AC
  Filled 2019-09-06: qty 4

## 2019-09-06 MED ORDER — SCOPOLAMINE 1 MG/3DAYS TD PT72
1.0000 | MEDICATED_PATCH | Freq: Once | TRANSDERMAL | Status: DC
  Administered 2019-09-06: 1.5 mg via TRANSDERMAL
  Filled 2019-09-06: qty 1

## 2019-09-06 MED ORDER — HEPARIN SOD (PORK) LOCK FLUSH 100 UNIT/ML IV SOLN
500.0000 [IU] | Freq: Once | INTRAVENOUS | Status: AC
  Administered 2019-09-06: 500 [IU] via INTRAVENOUS

## 2019-09-06 MED ORDER — SODIUM CHLORIDE 0.9 % IV SOLN
Freq: Once | INTRAVENOUS | Status: AC
  Filled 2019-09-06: qty 1000

## 2019-09-06 NOTE — Progress Notes (Signed)
[place] reviewed with and pt seen by Dr. Delton Coombes and pt to receive 1 liter of NS with magnesium and potassium over 2 hours, Zofran 8 mg IV and scopolamine patch today per MD         Desiree Hane Cull tolerated IV hydration and Zofran IV well without complaints or incident. VSS Pt discharged self ambulatory in satisfactory condition accompanied by family member

## 2019-09-06 NOTE — Patient Instructions (Signed)
[place] at [facility] Discharge Instructions  Received IV hydration with magnesium and potassium today as well as Zofran IV.Follow-up as scheduled. Call clinic for any questions or concerns   Thank you for choosing [place] at [facility] to provide your oncology and hematology care.  To afford each patient quality time with our provider, please arrive at least 15 minutes before your scheduled appointment time.   If you have a lab appointment with the [facility] please come in thru the Main Entrance and check in at the main information desk.  You need to re-schedule your appointment should you arrive 10 or more minutes late.  We strive to give you quality time with our providers, and arriving late affects you and other patients whose appointments are after yours.  Also, if you no show three or more times for appointments you may be dismissed from the clinic at the providers discretion.     Again, thank you for choosing [facility].  Our hope is that these requests will decrease the amount of time that you wait before being seen by our physicians.       _____________________________________________________________  Should you have questions after your visit to [facility], please contact our office at [phone] between the hours of 8:00 a.m. and 4:30 p.m.  Voicemails left after 4:00 p.m. will not be returned until the following business day.  For prescription refill requests, have your pharmacy contact our office and allow 72 hours.    Due to Covid, you will need to wear a mask upon entering the hospital. If you do not have a mask, a mask will be given to you at the Main Entrance upon arrival. For doctor visits, patients may have 1 support person with them. For treatment visits, patients can not have anyone with them due to social distancing guidelines and our immunocompromised population.

## 2019-09-06 NOTE — Progress Notes (Signed)
Steven Crane, [place] [address]   CLINIC:  Medical Oncology/Hematology  PCP:  Steven Crane, [place] [address] [phone]   REASON FOR VISIT:  Follow-up for GE junction adenocarcinoma.  CURRENT THERAPY: Chemoradiation therapy.    INTERVAL HISTORY:  Steven Crane 59 y.o. male seen for follow-up and toxicity assessment for his GE junction adenocarcinoma.  He completed radiation therapy on 09/01/2019.  He reports that he has been vomiting since then averaging 2-3 times per day.  He is taking Compazine which is not helping.  He also reports pain on swallowing in the retrosternal area.  He is not eating well.  He has lost 7 pounds.  Appetite is 25%.  Energy levels are 50%.   REVIEW OF SYSTEMS:  Review of Systems  HENT:   Positive for trouble swallowing.   Respiratory: Positive for cough and shortness of breath.   Gastrointestinal: Positive for constipation, nausea and vomiting.  Neurological: Positive for headaches.  All other systems reviewed and are negative.    PAST MEDICAL/SURGICAL HISTORY:  Past Medical History:  Diagnosis Date  . Anxiety   . Arthritis    "hands, back, knees" (08/27/2013)  . Asthma   . CHF (congestive heart failure) ([place]) 02/2010  . Chronic bronchitis ([place])    "get it q year"  . Chronic lower back pain   . Closed head injury 1975  . Coronary artery disease May 2015   s/p successful PTCA/DES x 1 to distal RCA and PTCA/DES x 1 to OM-20 Aug 2013  . Depression   . DVT (deep venous thrombosis) ([place]) 2008   "LLE"  . Esophageal cancer ([place])   . GERD (gastroesophageal reflux disease)   . Headache    hs of but no longer has problems   . History of kidney stones   . Hyperlipemia   . Hypertension   . Hypertriglyceridemia   . LV dysfunction    LVEF 40% at time of cardiac cath May 2015  . Morbid obesity ([place])   . Myocardial infarction ([place]) 02/2010  . Psoriasis   . Type II diabetes  mellitus ([place])    Past Surgical History:  Procedure Laterality Date  . BIOPSY  06/17/2019   Procedure: BIOPSY;  Surgeon: [name], MD;  Location: [facility];  Service: Endoscopy;;  . COLONOSCOPY  01/06/2012   Dr. [name]: suboptimal prep. normal exam. next colonoscopy in 10 years.   . CORONARY ANGIOPLASTY WITH STENT PLACEMENT  02/2010; 08/27/2013   "1 + 3"   . ESOPHAGOGASTRODUODENOSCOPY (EGD) WITH PROPOFOL N/A 06/17/2019   Procedure: ESOPHAGOGASTRODUODENOSCOPY (EGD) WITH PROPOFOL;  Surgeon: [name], MD;  Location: [facility];  Service: Endoscopy;  Laterality: N/A;  11:15am  . IR IMAGING GUIDED PORT INSERTION  08/03/2019  . KNEE ARTHROSCOPY Left 1981  . LEFT HEART CATHETERIZATION WITH CORONARY ANGIOGRAM N/A 08/27/2013   Procedure: LEFT HEART CATHETERIZATION WITH CORONARY ANGIOGRAM;  Surgeon: [name], MD;  Location: [facility] CATH LAB;  Service: Cardiovascular;  Laterality: N/A;  . right wrist fracture surgery     . TIBIA IM NAIL INSERTION Left 08/21/2018   Procedure: INTRAMEDULLARY (IM) NAIL TIBIAL;  Surgeon: Rod Can, MD;  Location: [facility] ORS;  Service: Orthopedics;  Laterality: Left;     SOCIAL HISTORY:  Social History   Socioeconomic History  . Marital status: Single    Spouse name: Not on file  . Number of children: 3  .  Years of education: Not on file  . Highest education level: Not on file  Occupational History  . Occupation: [employer]  Tobacco Use  . Smoking status: Never Smoker  . Smokeless tobacco: Current User    Types: Snuff, Chew  . Tobacco comment: "started dipping @ age 33; quit for 5 1/2 yrs at one time; hadn't chewed in awhile"  Substance and Sexual Activity  . Alcohol use: Yes    Alcohol/week: 0.0 standard drinks    Comment: occ   . Drug use: No  . Sexual activity: Yes  Other Topics Concern  . Not on file  Social History Narrative  . Not on file   Social Determinants of Health   Financial Resource Strain: Low Risk   . Difficulty of  Paying Living Expenses: Not hard at all  Food Insecurity: No Food Insecurity  . Worried About Charity fundraiser in the Last Year: Never true  . Ran Out of Food in the Last Year: Never true  Transportation Needs: No Transportation Needs  . Lack of Transportation (Medical): No  . Lack of Transportation (Non-Medical): No  Physical Activity: Insufficiently Active  . Days of Exercise per Week: 1 day  . Minutes of Exercise per Session: 20 min  Stress: No Stress Concern Present  . Feeling of Stress : Not at all  Social Connections: Slightly Isolated  . Frequency of Communication with Friends and Family: Three times a week  . Frequency of Social Gatherings with Friends and Family: Three times a week  . Attends Religious Services: 1 to 4 times per year  . Active Member of Clubs or Organizations: No  . Attends Archivist Meetings: Never  . Marital Status: Living with partner  Intimate Partner Violence: Not At Risk  . Fear of Current or Ex-Partner: No  . Emotionally Abused: No  . Physically Abused: No  . Sexually Abused: No    FAMILY HISTORY:  Family History  Problem Relation Age of Onset  . Coronary artery disease Father   . Diabetes type II Father   . Diabetes Father   . Heart attack Father   . Hypertension Mother     CURRENT MEDICATIONS:  Outpatient Encounter Medications as of 09/06/2019  Medication Sig  . acetaminophen (TYLENOL) 500 MG tablet Take 1,000 mg by mouth in the morning and at bedtime.   Marland Kitchen amLODipine (NORVASC) 5 MG tablet Take 1 tablet (5 mg total) by mouth daily.  Marland Kitchen aspirin EC 81 MG tablet Take 1 tablet (81 mg total) by mouth daily.  Marland Kitchen atorvastatin (LIPITOR) 40 MG tablet Take 1 tablet by mouth once daily  . blood glucose meter kit and supplies Test glucose once daily. ICD 10 E11.9  . enalapril (VASOTEC) 20 MG tablet Take 1 tablet (20 mg total) by mouth 2 (two) times daily.  . fenofibrate 160 MG tablet Take 1 tablet (160 mg total) by mouth daily.  Marland Kitchen  glyBURIDE-metformin (GLUCOVANCE) 5-500 MG tablet Take 2 tablets by mouth 2 (two) times daily.  . Lactulose 20 GM/30ML SOLN Take 30 mLs (20 g total) by mouth at bedtime. May increase to twice a day if needed.  . lidocaine (XYLOCAINE) 2 % solution 15 mL.  . metoprolol succinate (TOPROL-XL) 50 MG 24 hr tablet TAKE 1 TABLET BY MOUTH ONCE DAILY WITH OR IMMEDIATELY FOLLOWING A MEAL  . PACLITAXEL IV Inject 50 mg/m2 into the vein once a week.   . pentoxifylline (TRENTAL) 400 MG CR tablet Take 1 tablet (400 mg  total) by mouth 3 (three) times daily with meals.  Marland Kitchen albuterol (PROVENTIL HFA;VENTOLIN HFA) 108 (90 Base) MCG/ACT inhaler Inhale 2 puffs into the lungs every 6 (six) hours as needed for wheezing or shortness of breath. (Patient not taking: Reported on 09/06/2019)  . albuterol (VENTOLIN HFA) 108 (90 Base) MCG/ACT inhaler INHALE 2 PUFFS BY MOUTH EVERY 6 HOURS AS NEEDED FOR WHEEZING FOR SHORTNESS OF BREATH (Patient not taking: Reported on 09/06/2019)  . ALPRAZolam (XANAX) 0.5 MG tablet TAKE ONE-HALF TO ONE TABLET BY MOUTH ONCE DAILY AS NEEDED (Patient not taking: Reported on 09/06/2019)  . dexlansoprazole (DEXILANT) 60 MG capsule Take 1 capsule (60 mg total) by mouth daily as needed (reflux). (Patient not taking: Reported on 09/06/2019)  . lidocaine (XYLOCAINE) 2 % solution Use as directed 15 mLs in the mouth or throat as needed for mouth pain. (Patient not taking: Reported on 09/06/2019)  . lidocaine-prilocaine (EMLA) cream Apply a small amount to port a cath site and cover with plastic wrap one hour prior to infusion appointments (Patient not taking: Reported on 09/06/2019)  . ondansetron (ZOFRAN ODT) 8 MG disintegrating tablet Take 1 tablet (8 mg total) by mouth every 8 (eight) hours as needed for nausea or vomiting.  . prochlorperazine (COMPAZINE) 10 MG tablet Take 1 tablet (10 mg total) by mouth every 6 (six) hours as needed for nausea or vomiting. (Patient not taking: Reported on 09/06/2019)  . [DISCONTINUED]  lidocaine (XYLOCAINE) 2 % solution Swish and swallow 15 ml before meals as needed for mouth/throat pain (Patient not taking: Reported on 09/06/2019)   Facility-Administered Encounter Medications as of 09/06/2019  Medication  . [COMPLETED] heparin lock flush 100 unit/mL  . sodium chloride flush (NS) 0.9 % injection 10 mL    ALLERGIES:  Allergies  Allergen Reactions  . Adhesive [Tape] Rash     PHYSICAL EXAM:  ECOG Performance status: 1  Vitals:   09/06/19 1342  BP: 119/77  Pulse: 96  Resp: 17  Temp: (!) 97.5 F (36.4 C)  SpO2: 97%   Filed Weights   09/06/19 1342  Weight: 245 lb (111.1 kg)    Physical Exam Vitals reviewed.  Constitutional:      Appearance: Normal appearance.  Cardiovascular:     Rate and Rhythm: Normal rate and regular rhythm.     Heart sounds: Normal heart sounds.  Pulmonary:     Effort: Pulmonary effort is normal.     Breath sounds: Normal breath sounds.  Abdominal:     General: There is no distension.     Palpations: Abdomen is soft. There is no mass.  Skin:    General: Skin is warm.  Neurological:     General: No focal deficit present.     Mental Status: He is alert and oriented to person, place, and time.  Psychiatric:        Mood and Affect: Mood normal.        Behavior: Behavior normal.      LABORATORY DATA:  I have reviewed the labs as listed.  CBC    Component Value Date/Time   WBC 2.3 (L) 09/06/2019 1252   RBC 4.55 09/06/2019 1252   HGB 13.5 09/06/2019 1252   HCT 38.6 (L) 09/06/2019 1252   PLT 145 (L) 09/06/2019 1252   MCV 84.8 09/06/2019 1252   MCH 29.7 09/06/2019 1252   MCHC 35.0 09/06/2019 1252   RDW 15.5 09/06/2019 1252   LYMPHSABS 0.2 (L) 09/06/2019 1252   MONOABS 0.4 09/06/2019 1252  EOSABS 0.0 09/06/2019 1252   BASOSABS 0.0 09/06/2019 1252   CMP Latest Ref Rng & Units 09/06/2019 08/30/2019 08/23/2019  Glucose 70 - 99 mg/dL 252(H) 214(H) 226(H)  BUN 6 - 20 mg/dL _0 Creatinine 0.61 - 1.24 mg/dL 0.66 0.48(L)  0.65  Sodium 135 - 145 mmol/L 128(L) 132(L) 135  Potassium 3.5 - 5.1 mmol/L 4.2 3.9 4.2  Chloride 98 - 111 mmol/L 96(L) 101 103  CO2 22 - 32 mmol/L _1 Calcium 8.9 - 10.3 mg/dL 8.7(L) 8.8(L) 8.9  Total Protein 6.5 - 8.1 g/dL 6.8 6.4(L) 6.5  Total Bilirubin 0.3 - 1.2 mg/dL 0.6 0.6 0.8  Alkaline Phos 38 - 126 U/L 58 44 38  AST 15 - 41 U/L _2 ALT 0 - 44 U/L 35 30 32       DIAGNOSTIC IMAGING:  I have reviewed scans.     ASSESSMENT & PLAN:   Primary adenocarcinoma of gastroesophageal junction (HCC) 1.  GE junction adenocarcinoma: -PET scan on 07/20/2019 shows uptake in the mass with nonhypermetabolic gastrohepatic lymph nodes.  No metastatic disease. -Chemoradiation therapy with weekly carboplatin and paclitaxel from 07/26/2019 through 08/30/2019. -We will plan to repeat PET CT scan in 4 to 5 weeks after finishing radiation therapy on 09/01/2019.  2.  Weight loss/dysphagia: -He reports pain in the retrosternal area when he swallows.  He could not tolerate Magic mouthwash. -He lost 7 pounds in the last 1 week.  He is been throwing up since last chemo and radiation treatment last week.  He also threw up at 12;15 today.  He has vomited 2-3 times a day on average since last week. -We will give Zofran 8 mg IV with 1 L of fluid.  Will apply scopolamine patch. -We have called in Zofran 8 mg ODT every 8 hours as needed.  3.  Constipation: -Continue stool softeners and lactulose.  4.  Diabetes: -Continue Glucovance and Levemir.  Leukos today is 252.  5.  Hypertension: -Continue to hold Vasotec.  Blood pressure is 119/77.       Orders placed this encounter:  No orders of the defined types were placed in this encounter.    Derek Jack, MD [place] [phone]

## 2019-09-06 NOTE — Assessment & Plan Note (Signed)
1.  GE junction adenocarcinoma: -PET scan on 07/20/2019 shows uptake in the mass with nonhypermetabolic gastrohepatic lymph nodes.  No metastatic disease. -Chemoradiation therapy with weekly carboplatin and paclitaxel from 07/26/2019 through 08/30/2019. -We will plan to repeat PET CT scan in 4 to 5 weeks after finishing radiation therapy on 09/01/2019.  2.  Weight loss/dysphagia: -He reports pain in the retrosternal area when he swallows.  He could not tolerate Magic mouthwash. -He lost 7 pounds in the last 1 week.  He is been throwing up since last chemo and radiation treatment last week.  He also threw up at 12;15 today.  He has vomited 2-3 times a day on average since last week. -We will give Zofran 8 mg IV with 1 L of fluid.  Will apply scopolamine patch. -We have called in Zofran 8 mg ODT every 8 hours as needed.  3.  Constipation: -Continue stool softeners and lactulose.  4.  Diabetes: -Continue Glucovance and Levemir.  Leukos today is 252.  5.  Hypertension: -Continue to hold Vasotec.  Blood pressure is 119/77.

## 2019-09-10 ENCOUNTER — Other Ambulatory Visit: Payer: Self-pay

## 2019-09-10 ENCOUNTER — Encounter (HOSPITAL_COMMUNITY): Payer: Self-pay

## 2019-09-10 ENCOUNTER — Inpatient Hospital Stay (HOSPITAL_COMMUNITY): Payer: BC Managed Care – PPO

## 2019-09-10 VITALS — BP 130/72 | HR 92 | Temp 96.9°F | Resp 18

## 2019-09-10 DIAGNOSIS — K30 Functional dyspepsia: Secondary | ICD-10-CM

## 2019-09-10 DIAGNOSIS — C160 Malignant neoplasm of cardia: Secondary | ICD-10-CM

## 2019-09-10 LAB — CBC WITH DIFFERENTIAL/PLATELET
Abs Immature Granulocytes: 0.2 10*3/uL — ABNORMAL HIGH (ref 0.00–0.07)
Basophils Absolute: 0 10*3/uL (ref 0.0–0.1)
Basophils Relative: 1 %
Eosinophils Absolute: 0 10*3/uL (ref 0.0–0.5)
Eosinophils Relative: 1 %
HCT: 38.5 % — ABNORMAL LOW (ref 39.0–52.0)
Hemoglobin: 13.4 g/dL (ref 13.0–17.0)
Immature Granulocytes: 8 %
Lymphocytes Relative: 32 %
Lymphs Abs: 0.8 10*3/uL (ref 0.7–4.0)
MCH: 29.6 pg (ref 26.0–34.0)
MCHC: 34.8 g/dL (ref 30.0–36.0)
MCV: 85 fL (ref 80.0–100.0)
Monocytes Absolute: 0.6 10*3/uL (ref 0.1–1.0)
Monocytes Relative: 23 %
Neutro Abs: 0.9 10*3/uL — ABNORMAL LOW (ref 1.7–7.7)
Neutrophils Relative %: 35 %
Platelets: 142 10*3/uL — ABNORMAL LOW (ref 150–400)
RBC: 4.53 MIL/uL (ref 4.22–5.81)
RDW: 16.4 % — ABNORMAL HIGH (ref 11.5–15.5)
WBC: 2.5 10*3/uL — ABNORMAL LOW (ref 4.0–10.5)
nRBC: 0 % (ref 0.0–0.2)

## 2019-09-10 LAB — COMPREHENSIVE METABOLIC PANEL
ALT: 46 U/L — ABNORMAL HIGH (ref 0–44)
AST: 34 U/L (ref 15–41)
Albumin: 2.9 g/dL — ABNORMAL LOW (ref 3.5–5.0)
Alkaline Phosphatase: 81 U/L (ref 38–126)
Anion gap: 9 (ref 5–15)
BUN: 12 mg/dL (ref 6–20)
CO2: 22 mmol/L (ref 22–32)
Calcium: 8.5 mg/dL — ABNORMAL LOW (ref 8.9–10.3)
Chloride: 98 mmol/L (ref 98–111)
Creatinine, Ser: 0.63 mg/dL (ref 0.61–1.24)
GFR calc Af Amer: >60 mL/min (ref >60)
GFR calc non Af Amer: >60 mL/min (ref >60)
Glucose, Bld: 233 mg/dL — ABNORMAL HIGH (ref 70–99)
Potassium: 3.8 mmol/L (ref 3.5–5.1)
Sodium: 129 mmol/L — ABNORMAL LOW (ref 135–145)
Total Bilirubin: 0.6 mg/dL (ref 0.3–1.2)
Total Protein: 6.1 g/dL — ABNORMAL LOW (ref 6.5–8.1)

## 2019-09-10 LAB — MAGNESIUM: Magnesium: 1.7 mg/dL (ref 1.7–2.4)

## 2019-09-10 MED ORDER — HEPARIN SOD (PORK) LOCK FLUSH 100 UNIT/ML IV SOLN
500.0000 [IU] | Freq: Once | INTRAVENOUS | Status: AC
  Administered 2019-09-10: 500 [IU] via INTRAVENOUS

## 2019-09-10 MED ORDER — ALUM & MAG HYDROXIDE-SIMETH 200-200-20 MG/5ML PO SUSP
30.0000 mL | Freq: Once | ORAL | Status: AC
  Administered 2019-09-10: 30 mL via ORAL
  Filled 2019-09-10: qty 30

## 2019-09-10 MED ORDER — SODIUM CHLORIDE 0.9 % IV SOLN: INTRAVENOUS | Status: DC

## 2019-09-10 NOTE — Progress Notes (Signed)
Patient tolerated hydration with no complaints voiced.  Port site clean and dry with good blood return noted before and after hydration.  No bruising or swelling noted with port.  Band aid applied.  VSS with discharge and left ambulatory with no s/s of distress noted.   

## 2019-09-14 ENCOUNTER — Inpatient Hospital Stay (HOSPITAL_BASED_OUTPATIENT_CLINIC_OR_DEPARTMENT_OTHER): Payer: BC Managed Care – PPO | Admitting: Nurse Practitioner

## 2019-09-14 ENCOUNTER — Inpatient Hospital Stay (HOSPITAL_COMMUNITY): Payer: BC Managed Care – PPO

## 2019-09-14 ENCOUNTER — Other Ambulatory Visit: Payer: Self-pay

## 2019-09-14 DIAGNOSIS — C160 Malignant neoplasm of cardia: Secondary | ICD-10-CM

## 2019-09-14 LAB — CBC WITH DIFFERENTIAL/PLATELET
Abs Immature Granulocytes: 0.05 10*3/uL (ref 0.00–0.07)
Basophils Absolute: 0 10*3/uL (ref 0.0–0.1)
Basophils Relative: 1 %
Eosinophils Absolute: 0 10*3/uL (ref 0.0–0.5)
Eosinophils Relative: 1 %
HCT: 38.9 % — ABNORMAL LOW (ref 39.0–52.0)
Hemoglobin: 13.2 g/dL (ref 13.0–17.0)
Immature Granulocytes: 2 %
Lymphocytes Relative: 36 %
Lymphs Abs: 1.1 10*3/uL (ref 0.7–4.0)
MCH: 28.9 pg (ref 26.0–34.0)
MCHC: 33.9 g/dL (ref 30.0–36.0)
MCV: 85.1 fL (ref 80.0–100.0)
Monocytes Absolute: 0.6 10*3/uL (ref 0.1–1.0)
Monocytes Relative: 20 %
Neutro Abs: 1.2 10*3/uL — ABNORMAL LOW (ref 1.7–7.7)
Neutrophils Relative %: 40 %
Platelets: 153 10*3/uL (ref 150–400)
RBC: 4.57 MIL/uL (ref 4.22–5.81)
RDW: 16.6 % — ABNORMAL HIGH (ref 11.5–15.5)
WBC: 3.1 10*3/uL — ABNORMAL LOW (ref 4.0–10.5)
nRBC: 0 % (ref 0.0–0.2)

## 2019-09-14 LAB — COMPREHENSIVE METABOLIC PANEL
ALT: 38 U/L (ref 0–44)
AST: 31 U/L (ref 15–41)
Albumin: 2.9 g/dL — ABNORMAL LOW (ref 3.5–5.0)
Alkaline Phosphatase: 82 U/L (ref 38–126)
Anion gap: 9 (ref 5–15)
BUN: 10 mg/dL (ref 6–20)
CO2: 24 mmol/L (ref 22–32)
Calcium: 8.5 mg/dL — ABNORMAL LOW (ref 8.9–10.3)
Chloride: 97 mmol/L — ABNORMAL LOW (ref 98–111)
Creatinine, Ser: 0.59 mg/dL — ABNORMAL LOW (ref 0.61–1.24)
GFR calc Af Amer: >60 mL/min (ref >60)
GFR calc non Af Amer: >60 mL/min (ref >60)
Glucose, Bld: 249 mg/dL — ABNORMAL HIGH (ref 70–99)
Potassium: 3.9 mmol/L (ref 3.5–5.1)
Sodium: 130 mmol/L — ABNORMAL LOW (ref 135–145)
Total Bilirubin: 0.6 mg/dL (ref 0.3–1.2)
Total Protein: 5.9 g/dL — ABNORMAL LOW (ref 6.5–8.1)

## 2019-09-14 LAB — MAGNESIUM: Magnesium: 1.7 mg/dL (ref 1.7–2.4)

## 2019-09-14 MED ORDER — HEPARIN SOD (PORK) LOCK FLUSH 100 UNIT/ML IV SOLN
500.0000 [IU] | Freq: Once | INTRAVENOUS | Status: AC
  Administered 2019-09-14: 500 [IU] via INTRAVENOUS

## 2019-09-14 MED ORDER — SODIUM CHLORIDE 0.9 % IV SOLN: Freq: Once | INTRAVENOUS | Status: AC

## 2019-09-14 MED ORDER — SODIUM CHLORIDE 0.9% FLUSH
10.0000 mL | INTRAVENOUS | Status: DC | PRN
  Administered 2019-09-14: 10 mL via INTRAVENOUS

## 2019-09-14 NOTE — Progress Notes (Signed)
[place] reviewed with and pt seen by RLockamy NP and pt to receive 1 liter of NS over 1 hour today per NP                            [name] tolerated IV hydration well without complaints or incident. Pt discharged self ambulatory in satisfactory condition accompanied by family member

## 2019-09-14 NOTE — Progress Notes (Signed)
Steven Crane, Steven Crane [id number]   CLINIC:  Medical Oncology/Hematology  PCP:  [name], [place] [address] [phone]   REASON FOR VISIT: Follow-up for gastroesophageal junction adenocarcinoma  CURRENT THERAPY: Finished chemo and radiation waiting on surgery.  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[place])   07/26/2019 -  Chemotherapy   The patient had palonosetron (ALOXI) injection 0.25 mg, 0.25 mg, Intravenous,  Once, 1 of 1 cycle Administration: 0.25 mg (07/26/2019), 0.25 mg (08/02/2019), 0.25 mg (08/09/2019), 0.25 mg (08/17/2019), 0.25 mg (08/23/2019), 0.25 mg (08/30/2019) CARBOplatin (PARAPLATIN) 300 mg in sodium chloride 0.9 % 250 mL chemo infusion, 300 mg (100 % of original dose 300 mg), Intravenous,  Once, 1 of 1 cycle Dose modification:   (original dose 300 mg, Cycle 1),   (original dose 300 mg, Cycle 1),   (original dose 300 mg, Cycle 1),   (original dose 300 mg, Cycle 1),   (original dose 300 mg, Cycle 1),   (original dose 300 mg, Cycle 1) Administration: 300 mg (07/26/2019), 300 mg (08/02/2019), 300 mg (08/09/2019), 300 mg (08/17/2019), 300 mg (08/23/2019), 300 mg (08/30/2019) PACLitaxel (TAXOL) 132 mg in sodium chloride 0.9 % 250 mL chemo infusion (</= 63m/m2), 50 mg/m2 = 132 mg, Intravenous,  Once, 1 of 1 cycle Administration: 132 mg (07/26/2019), 132 mg (08/02/2019), 132 mg (08/09/2019), 132 mg (08/17/2019), 132 mg (08/23/2019), 132 mg (08/30/2019)  for chemotherapy treatment.       INTERVAL HISTORY:  Mr. [name] male returns for routine follow-up for GE junction carcinoma.  Patient reports he is having a burning sensation every time he eats or drinks.  He has a horrible burning taste in his mouth as well.  When he smells food being cooked it makes him nauseous. Denies  any vomiting, or diarrhea. Denies any new pains. Had not noticed any recent bleeding such as epistaxis, hematuria or hematochezia. Denies recent chest pain on exertion, shortness of breath on minimal exertion, pre-syncopal episodes, or palpitations. Denies any numbness or tingling in hands or feet. Denies any recent fevers, infections, or recent hospitalizations. Patient reports appetite at 0% and energy level at 25%.     REVIEW OF SYSTEMS:  Review of Systems  Constitutional: Positive for fatigue.  Respiratory: Positive for cough.   Gastrointestinal: Positive for nausea.  Genitourinary: Positive for frequency.   Neurological: Positive for dizziness, headaches and numbness.  All other systems reviewed and are negative.    PAST MEDICAL/SURGICAL HISTORY:  Past Medical History:  Diagnosis Date  . Anxiety   . Arthritis    "hands, back, knees" (08/27/2013)  . Asthma   . CHF (congestive heart failure) ([place] 02/2010  . Chronic bronchitis ([place]    "get it q year"  . Chronic lower back pain   . Closed head injury 1975  . Coronary artery disease May 2015   s/p successful PTCA/DES x 1 to distal RCA and PTCA/DES x 1 to OM-20 Aug 2013  . Depression   . DVT (deep venous thrombosis) ([facility] 2008   "LLE"  . Esophageal cancer ([place]   . GERD (gastroesophageal reflux disease)   . Headache    hs of but no longer has problems   . History of kidney stones   . Hyperlipemia   . Hypertension   . Hypertriglyceridemia   . LV dysfunction  LVEF 40% at time of cardiac cath May 2015  . Morbid obesity ([place])   . Myocardial infarction ([place]) 02/2010  . Psoriasis   . Type II diabetes mellitus ([place])    Past Surgical History:  Procedure Laterality Date  . BIOPSY  06/17/2019   Procedure: BIOPSY;  Surgeon: Steven Dolin, MD;  Location: [facility];  Service: Endoscopy;;  . COLONOSCOPY  01/06/2012   Dr. Gala Crane: suboptimal prep. normal exam. next colonoscopy in 10 years.   . CORONARY ANGIOPLASTY WITH STENT  PLACEMENT  02/2010; 08/27/2013   "1 + 3"   . ESOPHAGOGASTRODUODENOSCOPY (EGD) WITH PROPOFOL N/A 06/17/2019   Procedure: ESOPHAGOGASTRODUODENOSCOPY (EGD) WITH PROPOFOL;  Surgeon: Steven Dolin, MD;  Location: [facility];  Service: Endoscopy;  Laterality: N/A;  11:15am  . IR IMAGING GUIDED PORT INSERTION  08/03/2019  . KNEE ARTHROSCOPY Left 1981  . LEFT HEART CATHETERIZATION WITH CORONARY ANGIOGRAM N/A 08/27/2013   Procedure: LEFT HEART CATHETERIZATION WITH CORONARY ANGIOGRAM;  Surgeon: Steven Blanks, MD;  Location: [facility] CATH LAB;  Service: Cardiovascular;  Laterality: N/A;  . right wrist fracture surgery     . TIBIA IM NAIL INSERTION Left 08/21/2018   Procedure: INTRAMEDULLARY (IM) NAIL TIBIAL;  Surgeon: Steven Can, MD;  Location: [facility] ORS;  Service: Orthopedics;  Laterality: Left;     SOCIAL HISTORY:  Social History   Socioeconomic History  . Marital status: Single    Spouse name: Not on file  . Number of children: 3  . Years of education: Not on file  . Highest education level: Not on file  Occupational History  . Occupation: [employer]  Tobacco Use  . Smoking status: Never Smoker  . Smokeless tobacco: Current User    Types: Snuff, Chew  . Tobacco comment: "started dipping @ age 54; quit for 5 1/2 yrs at one time; hadn't chewed in awhile"  Substance and Sexual Activity  . Alcohol use: Yes    Alcohol/week: 0.0 standard drinks    Comment: occ   . Drug use: No  . Sexual activity: Yes  Other Topics Concern  . Not on file  Social History Narrative  . Not on file   Social Determinants of Health   Financial Resource Strain: Low Risk   . Difficulty of Paying Living Expenses: Not hard at all  Food Insecurity: No Food Insecurity  . Worried About Charity fundraiser in the Last Year: Never true  . Ran Out of Food in the Last Year: Never true  Transportation Needs: No Transportation Needs  . Lack of Transportation (Medical): No  . Lack of Transportation (Non-Medical): No    Physical Activity: Insufficiently Active  . Days of Exercise per Week: 1 day  . Minutes of Exercise per Session: 20 min  Stress: No Stress Concern Present  . Feeling of Stress : Not at all  Social Connections: Slightly Isolated  . Frequency of Communication with Friends and Family: Three times a week  . Frequency of Social Gatherings with Friends and Family: Three times a week  . Attends Religious Services: 1 to 4 times per year  . Active Member of Clubs or Organizations: No  . Attends Archivist Meetings: Never  . Marital Status: Living with partner  Intimate Partner Violence: Not At Risk  . Fear of Current or Ex-Partner: No  . Emotionally Abused: No  . Physically Abused: No  . Sexually Abused: No    FAMILY HISTORY:  Family History  Problem Relation Age of  Onset  . Coronary artery disease Father   . Diabetes type II Father   . Diabetes Father   . Heart attack Father   . Hypertension Mother     CURRENT MEDICATIONS:  Outpatient Encounter Medications as of 09/14/2019  Medication Sig  . amLODipine (NORVASC) 5 MG tablet Take 1 tablet (5 mg total) by mouth daily.  Marland Kitchen aspirin EC 81 MG tablet Take 1 tablet (81 mg total) by mouth daily.  Marland Kitchen atorvastatin (LIPITOR) 40 MG tablet Take 1 tablet by mouth once daily  . blood glucose meter kit and supplies Test glucose once daily. ICD 10 E11.9  . dexlansoprazole (DEXILANT) 60 MG capsule Take 1 capsule (60 mg total) by mouth daily as needed (reflux).  . enalapril (VASOTEC) 20 MG tablet Take 1 tablet (20 mg total) by mouth 2 (two) times daily.  . fenofibrate 160 MG tablet Take 1 tablet (160 mg total) by mouth daily.  Marland Kitchen glyBURIDE-metformin (GLUCOVANCE) 5-500 MG tablet Take 2 tablets by mouth 2 (two) times daily.  . Lactulose 20 GM/30ML SOLN Take 30 mLs (20 g total) by mouth at bedtime. May increase to twice a day if needed.  . lidocaine (XYLOCAINE) 2 % solution Use as directed 15 mLs in the mouth or throat as needed for mouth pain.  Marland Kitchen  lidocaine (XYLOCAINE) 2 % solution 15 mL.  . metoprolol succinate (TOPROL-XL) 50 MG 24 hr tablet TAKE 1 TABLET BY MOUTH ONCE DAILY WITH OR IMMEDIATELY FOLLOWING A MEAL  . ondansetron (ZOFRAN ODT) 8 MG disintegrating tablet Take 1 tablet (8 mg total) by mouth every 8 (eight) hours as needed for nausea or vomiting.  Marland Kitchen PACLITAXEL IV Inject 50 mg/m2 into the vein once a week.   . pentoxifylline (TRENTAL) 400 MG CR tablet Take 1 tablet (400 mg total) by mouth 3 (three) times daily with meals.  Marland Kitchen acetaminophen (TYLENOL) 500 MG tablet Take 1,000 mg by mouth in the morning and at bedtime.   Marland Kitchen albuterol (PROVENTIL HFA;VENTOLIN HFA) 108 (90 Base) MCG/ACT inhaler Inhale 2 puffs into the lungs every 6 (six) hours as needed for wheezing or shortness of breath. (Patient not taking: Reported on 09/14/2019)  . albuterol (VENTOLIN HFA) 108 (90 Base) MCG/ACT inhaler INHALE 2 PUFFS BY MOUTH EVERY 6 HOURS AS NEEDED FOR WHEEZING FOR SHORTNESS OF BREATH (Patient not taking: Reported on 09/14/2019)  . ALPRAZolam (XANAX) 0.5 MG tablet TAKE ONE-HALF TO ONE TABLET BY MOUTH ONCE DAILY AS NEEDED (Patient not taking: Reported on 09/14/2019)  . lidocaine-prilocaine (EMLA) cream Apply a small amount to port a cath site and cover with plastic wrap one hour prior to infusion appointments (Patient not taking: Reported on 09/14/2019)  . prochlorperazine (COMPAZINE) 10 MG tablet Take 1 tablet (10 mg total) by mouth every 6 (six) hours as needed for nausea or vomiting. (Patient not taking: Reported on 09/14/2019)   No facility-administered encounter medications on file as of 09/14/2019.    ALLERGIES:  Allergies  Allergen Reactions  . Adhesive [Tape] Rash     PHYSICAL EXAM:  ECOG Performance status: 1  Vitals:   09/14/19 1321  BP: (!) 104/91  Pulse: 98  Resp: 18  Temp: (!) 96.9 F (36.1 C)  SpO2: 97%   Filed Weights   09/14/19 1321  Weight: 230 lb (104.3 kg)   Physical Exam Constitutional:      Appearance: Normal  appearance. He is normal weight.  Cardiovascular:     Rate and Rhythm: Normal rate and regular rhythm.  Heart sounds: Normal heart sounds.  Pulmonary:     Effort: Pulmonary effort is normal.     Breath sounds: Normal breath sounds.  Abdominal:     General: Bowel sounds are normal.     Palpations: Abdomen is soft.  Musculoskeletal:        General: Normal range of motion.  Skin:    General: Skin is warm.  Neurological:     Mental Status: He is alert and oriented to person, place, and time. Mental status is at baseline.  Psychiatric:        Mood and Affect: Mood normal.        Behavior: Behavior normal.        Thought Content: Thought content normal.        Judgment: Judgment normal.      LABORATORY DATA:  I have reviewed the labs as listed.  CBC    Component Value Date/Time   WBC 3.1 (L) 09/14/2019 1232   RBC 4.57 09/14/2019 1232   HGB 13.2 09/14/2019 1232   HCT 38.9 (L) 09/14/2019 1232   PLT 153 09/14/2019 1232   MCV 85.1 09/14/2019 1232   MCH 28.9 09/14/2019 1232   MCHC 33.9 09/14/2019 1232   RDW 16.6 (H) 09/14/2019 1232   LYMPHSABS 1.1 09/14/2019 1232   MONOABS 0.6 09/14/2019 1232   EOSABS 0.0 09/14/2019 1232   BASOSABS 0.0 09/14/2019 1232   CMP Latest Ref Rng & Units 09/14/2019 09/10/2019 09/06/2019  Glucose 70 - 99 mg/dL 249(H) 233(H) 252(H)  BUN 6 - 20 mg/dL _0 Creatinine 0.61 - 1.24 mg/dL 0.59(L) 0.63 0.66  Sodium 135 - 145 mmol/L 130(L) 129(L) 128(L)  Potassium 3.5 - 5.1 mmol/L 3.9 3.8 4.2  Chloride 98 - 111 mmol/L 97(L) 98 96(L)  CO2 22 - 32 mmol/L _1 Calcium 8.9 - 10.3 mg/dL 8.5(L) 8.5(L) 8.7(L)  Total Protein 6.5 - 8.1 g/dL 5.9(L) 6.1(L) 6.8  Total Bilirubin 0.3 - 1.2 mg/dL 0.6 0.6 0.6  Alkaline Phos 38 - 126 U/L 82 81 58  AST 15 - 41 U/L 31 34 25  ALT 0 - 44 U/L 38 46(H) 35    All questions were answered to patient's stated satisfaction. Encouraged patient to call with any new concerns or questions before his next visit to the cancer  center and we Crane certain see him sooner, if needed.    ASSESSMENT & PLAN:  Primary adenocarcinoma of gastroesophageal junction (HCC) 1.  GE junction adenocarcinoma: -PET scan on 07/20/2019 shows uptake in the mass with nonhypermetabolic gastrohepatic lymph nodes.  No metastatic disease. -Chemoradiation therapy with weekly carboplatin and paclitaxel from 07/26/2019 through 08/30/2019. -Dr. Delton Coombes is wanting to repeat PET CT scan 4 to 5 weeks after finishing radiation therapy on 09/01/2019. -PET scan is on hold due to patient continuing to lose weight and unable to eat. -He will follow-up next week with a labs and fluids. -He has an appointment with Dr. Kipp Brood on Friday he will ask him about J-tube placement for feeding.  2.  Weight loss/dysphagia: -He reports pain in the retrosternal area when he swallows.  He could not tolerate Magic mouthwash. -He reports burning sensation when he eats. -He has tried Tums, Maalox, lidocaine gel, and Protonix.  None of which worked very well for him.  He was taken off the Prilosec by his cardiologist. -He will try to increase his calories over the next week. -He has lost 15 pounds over the last week. -We will give  him IV fluids today.     Orders placed this encounter:  Orders Placed This Encounter  Procedures  . Lactate dehydrogenase  . Magnesium  . CBC with Differential/Platelet  . Comprehensive metabolic panel      Francene Finders, FNP-C [place] [phone]

## 2019-09-14 NOTE — Progress Notes (Signed)
Patient seen by [facility] NP. Verbal order received to give 1 Liter of Normal Saline over an 1 hour. Labs reviewed by NP.

## 2019-09-14 NOTE — Assessment & Plan Note (Addendum)
1.  GE junction adenocarcinoma: -PET scan on 07/20/2019 shows uptake in the mass with nonhypermetabolic gastrohepatic lymph nodes.  No metastatic disease. -Chemoradiation therapy with weekly carboplatin and paclitaxel from 07/26/2019 through 08/30/2019. -Dr. Delton Coombes is wanting to repeat PET CT scan 4 to 5 weeks after finishing radiation therapy on 09/01/2019. -PET scan is on hold due to patient continuing to lose weight and unable to eat. -He will follow-up next week with a labs and fluids. -He has an appointment with Dr. Kipp Brood on Friday he will ask him about J-tube placement for feeding.  2.  Weight loss/dysphagia: -He reports pain in the retrosternal area when he swallows.  He could not tolerate Magic mouthwash. -He reports burning sensation when he eats. -He has tried Tums, Maalox, lidocaine gel, and Protonix.  None of which worked very well for him.  He was taken off the Prilosec by his cardiologist. -He will try to increase his calories over the next week. -He has lost 15 pounds over the last week. -We will give him IV fluids today.

## 2019-09-14 NOTE — Patient Instructions (Signed)
[facility] Discharge Instructions  Received IV hydration today. Follow-up as scheduled. Call clinic for any questions or concerns   Thank you for choosing [facility] at [facility] to provide your oncology and hematology care.  To afford each patient quality time with our provider, please arrive at least 15 minutes before your scheduled appointment time.   If you have a lab appointment with the Cancer Center please come in thru the Main Entrance and check in at the main information desk.  You need to re-schedule your appointment should you arrive 10 or more minutes late.  We strive to give you quality time with our providers, and arriving late affects you and other patients whose appointments are after yours.  Also, if you no show three or more times for appointments you may be dismissed from the clinic at the providers discretion.     Again, thank you for choosing [facility].  Our hope is that these requests will decrease the amount of time that you wait before being seen by our physicians.       _____________________________________________________________  Should you have questions after your visit to [facility], please contact our office at [phone] between the hours of 8:00 a.m. and 4:30 p.m.  Voicemails left after 4:00 p.m. will not be returned until the following business day.  For prescription refill requests, have your pharmacy contact our office and allow 72 hours.    Due to Covid, you will need to wear a mask upon entering the hospital. If you do not have a mask, a mask will be given to you at the Main Entrance upon arrival. For doctor visits, patients may have 1 support person with them. For treatment visits, patients can not have anyone with them due to social distancing guidelines and our immunocompromised population.     

## 2019-09-15 MED ORDER — FULVESTRANT 250 MG/5ML IM SOLN
INTRAMUSCULAR | Status: AC
  Filled 2019-09-15: qty 5

## 2019-09-16 ENCOUNTER — Other Ambulatory Visit: Payer: Self-pay

## 2019-09-16 ENCOUNTER — Telehealth: Payer: BC Managed Care – PPO | Admitting: Thoracic Surgery (Cardiothoracic Vascular Surgery)

## 2019-09-17 ENCOUNTER — Ambulatory Visit (HOSPITAL_COMMUNITY): Payer: BC Managed Care – PPO

## 2019-09-17 ENCOUNTER — Other Ambulatory Visit: Payer: Self-pay

## 2019-09-17 ENCOUNTER — Ambulatory Visit: Payer: BC Managed Care – PPO | Admitting: Thoracic Surgery (Cardiothoracic Vascular Surgery)

## 2019-09-17 ENCOUNTER — Telehealth: Payer: BC Managed Care – PPO | Admitting: Thoracic Surgery (Cardiothoracic Vascular Surgery)

## 2019-09-17 ENCOUNTER — Encounter: Payer: Self-pay | Admitting: Thoracic Surgery (Cardiothoracic Vascular Surgery)

## 2019-09-17 VITALS — BP 111/80 | HR 100 | Temp 97.7°F | Resp 20 | Ht 74.0 in | Wt 239.0 lb

## 2019-09-17 DIAGNOSIS — Z9861 Coronary angioplasty status: Secondary | ICD-10-CM | POA: Diagnosis not present

## 2019-09-17 DIAGNOSIS — C160 Malignant neoplasm of cardia: Secondary | ICD-10-CM

## 2019-09-17 DIAGNOSIS — I2510 Atherosclerotic heart disease of native coronary artery without angina pectoris: Secondary | ICD-10-CM | POA: Diagnosis not present

## 2019-09-17 NOTE — Progress Notes (Signed)
     [address]       [place] [address]             [phone]       Steven Crane comes in for a preoperative check.  He completed his radiation therapy on 08/26/2019.  He has lost approximately 70 pounds since initiation of his neoadjuvant therapy.  He continues to have significant dysphagia and has not been tolerating protein shakes throughout this entire course.  We discussed several options for enteral feeding which included a minimally invasive J-tube, image guided J-tube, and nasogastric tube.  He has opted to undergo a jejunostomy tube placement.  We will admit him on 6/2 for admission for placement of his J-tube and initiation of his tube feeds.  He will need to be on a good nutritional course if he is to proceed with surgery.  He is 3 weeks from completion of his radiation and ideally if he can gain 10 pounds over the course of the next 7 weeks he would be a better surgical candidate candidate.  Mychelle Kendra Bary Leriche

## 2019-09-17 NOTE — Progress Notes (Addendum)
Nutrition Follow-up:  Patient with esophageal cancer.  Patient has completed chemotherapy and radiation.    Spoke with patient for nutrition follow-up via phone briefly as in [place] getting ready to see surgeon.  Says that he was about to get some potato soup down yesterday.  Drank a shake as well.    MD notes reviewed and noted discussion today with surgeon regarding J-tube placement.    Medications: reviewed  Labs: reviewed  Anthropometrics:   Weight 230 lb on 5/25 decreased from 261 lb on 5/3  285 lb on 3/19  NUTRITION DIAGNOSIS: Inadequate oral intake continues   INTERVENTION:  Agree with early placement of J-tube for feeding with poor intake, weight loss.  Patient to continue high calorie, high protein foods and oral nutrition supplements as able. Patient has contact information    MONITORING, EVALUATION, GOAL: weight trends, intake   NEXT VISIT: to be determined  Steven Crane B. Zenia Resides, [place], [place] Registered Dietitian [phone] (pager)

## 2019-09-21 ENCOUNTER — Inpatient Hospital Stay (HOSPITAL_COMMUNITY): Payer: BC Managed Care – PPO

## 2019-09-21 ENCOUNTER — Inpatient Hospital Stay (HOSPITAL_COMMUNITY)
Admission: RE | Admit: 2019-09-21 | Discharge: 2019-09-23 | DRG: 641 | Disposition: A | Discharge status: Home / Self Care | Payer: BC Managed Care – PPO | Source: Ambulatory Visit | Attending: Thoracic Surgery (Cardiothoracic Vascular Surgery) | Admitting: Thoracic Surgery (Cardiothoracic Vascular Surgery)

## 2019-09-21 ENCOUNTER — Other Ambulatory Visit: Payer: Self-pay

## 2019-09-21 ENCOUNTER — Other Ambulatory Visit (HOSPITAL_COMMUNITY): Payer: BC Managed Care – PPO

## 2019-09-21 ENCOUNTER — Inpatient Hospital Stay (HOSPITAL_BASED_OUTPATIENT_CLINIC_OR_DEPARTMENT_OTHER): Payer: BC Managed Care – PPO | Admitting: Nurse Practitioner

## 2019-09-21 ENCOUNTER — Inpatient Hospital Stay (HOSPITAL_COMMUNITY): Payer: BC Managed Care – PPO | Attending: Hematology

## 2019-09-21 VITALS — BP 116/77 | HR 94 | Temp 97.7°F | Resp 18 | Wt 236.2 lb

## 2019-09-21 DIAGNOSIS — Z86718 Personal history of other venous thrombosis and embolism: Secondary | ICD-10-CM | POA: Diagnosis not present

## 2019-09-21 DIAGNOSIS — I2510 Atherosclerotic heart disease of native coronary artery without angina pectoris: Secondary | ICD-10-CM | POA: Diagnosis present

## 2019-09-21 DIAGNOSIS — Z85028 Personal history of other malignant neoplasm of stomach: Secondary | ICD-10-CM | POA: Diagnosis not present

## 2019-09-21 DIAGNOSIS — R6251 Failure to thrive (child): Secondary | ICD-10-CM | POA: Diagnosis present

## 2019-09-21 DIAGNOSIS — R634 Abnormal weight loss: Secondary | ICD-10-CM | POA: Insufficient documentation

## 2019-09-21 DIAGNOSIS — Z923 Personal history of irradiation: Secondary | ICD-10-CM

## 2019-09-21 DIAGNOSIS — I110 Hypertensive heart disease with heart failure: Secondary | ICD-10-CM | POA: Insufficient documentation

## 2019-09-21 DIAGNOSIS — C160 Malignant neoplasm of cardia: Secondary | ICD-10-CM | POA: Diagnosis present

## 2019-09-21 DIAGNOSIS — C159 Malignant neoplasm of esophagus, unspecified: Secondary | ICD-10-CM | POA: Diagnosis present

## 2019-09-21 DIAGNOSIS — Z20822 Contact with and (suspected) exposure to covid-19: Secondary | ICD-10-CM | POA: Diagnosis present

## 2019-09-21 DIAGNOSIS — Z79899 Other long term (current) drug therapy: Secondary | ICD-10-CM | POA: Diagnosis not present

## 2019-09-21 DIAGNOSIS — E46 Unspecified protein-calorie malnutrition: Secondary | ICD-10-CM | POA: Diagnosis present

## 2019-09-21 DIAGNOSIS — K219 Gastro-esophageal reflux disease without esophagitis: Secondary | ICD-10-CM | POA: Diagnosis present

## 2019-09-21 DIAGNOSIS — I509 Heart failure, unspecified: Secondary | ICD-10-CM | POA: Diagnosis present

## 2019-09-21 DIAGNOSIS — R1310 Dysphagia, unspecified: Secondary | ICD-10-CM | POA: Insufficient documentation

## 2019-09-21 DIAGNOSIS — Z833 Family history of diabetes mellitus: Secondary | ICD-10-CM | POA: Diagnosis not present

## 2019-09-21 DIAGNOSIS — E119 Type 2 diabetes mellitus without complications: Secondary | ICD-10-CM | POA: Insufficient documentation

## 2019-09-21 DIAGNOSIS — Z0184 Encounter for antibody response examination: Secondary | ICD-10-CM | POA: Diagnosis not present

## 2019-09-21 DIAGNOSIS — R627 Adult failure to thrive: Secondary | ICD-10-CM | POA: Diagnosis present

## 2019-09-21 DIAGNOSIS — Z7982 Long term (current) use of aspirin: Secondary | ICD-10-CM | POA: Diagnosis not present

## 2019-09-21 DIAGNOSIS — F17290 Nicotine dependence, other tobacco product, uncomplicated: Secondary | ICD-10-CM | POA: Insufficient documentation

## 2019-09-21 DIAGNOSIS — Z9221 Personal history of antineoplastic chemotherapy: Secondary | ICD-10-CM | POA: Insufficient documentation

## 2019-09-21 DIAGNOSIS — Z794 Long term (current) use of insulin: Secondary | ICD-10-CM | POA: Insufficient documentation

## 2019-09-21 DIAGNOSIS — C153 Malignant neoplasm of upper third of esophagus: Secondary | ICD-10-CM | POA: Diagnosis not present

## 2019-09-21 DIAGNOSIS — Z8249 Family history of ischemic heart disease and other diseases of the circulatory system: Secondary | ICD-10-CM

## 2019-09-21 DIAGNOSIS — K5900 Constipation, unspecified: Secondary | ICD-10-CM | POA: Insufficient documentation

## 2019-09-21 LAB — CBC
HCT: 34 % — ABNORMAL LOW (ref 39.0–52.0)
Hemoglobin: 11.7 g/dL — ABNORMAL LOW (ref 13.0–17.0)
MCH: 29.4 pg (ref 26.0–34.0)
MCHC: 34.4 g/dL (ref 30.0–36.0)
MCV: 85.4 fL (ref 80.0–100.0)
Platelets: 180 10*3/uL (ref 150–400)
RBC: 3.98 MIL/uL — ABNORMAL LOW (ref 4.22–5.81)
RDW: 17.5 % — ABNORMAL HIGH (ref 11.5–15.5)
WBC: 4.8 10*3/uL (ref 4.0–10.5)
nRBC: 0 % (ref 0.0–0.2)

## 2019-09-21 LAB — COMPREHENSIVE METABOLIC PANEL
ALT: 29 U/L (ref 0–44)
ALT: 28 U/L (ref 0–44)
AST: 21 U/L (ref 15–41)
AST: 21 U/L (ref 15–41)
Albumin: 3.2 g/dL — ABNORMAL LOW (ref 3.5–5.0)
Albumin: 2.8 g/dL — ABNORMAL LOW (ref 3.5–5.0)
Alkaline Phosphatase: 73 U/L (ref 38–126)
Alkaline Phosphatase: 62 U/L (ref 38–126)
Anion gap: 9 (ref 5–15)
Anion gap: 11 (ref 5–15)
BUN: 7 mg/dL (ref 6–20)
BUN: 8 mg/dL (ref 6–20)
CO2: 28 mmol/L (ref 22–32)
CO2: 26 mmol/L (ref 22–32)
Calcium: 8.8 mg/dL — ABNORMAL LOW (ref 8.9–10.3)
Calcium: 8.9 mg/dL (ref 8.9–10.3)
Chloride: 99 mmol/L (ref 98–111)
Chloride: 99 mmol/L (ref 98–111)
Creatinine, Ser: 0.55 mg/dL — ABNORMAL LOW (ref 0.61–1.24)
Creatinine, Ser: 0.61 mg/dL (ref 0.61–1.24)
GFR calc Af Amer: >60 mL/min (ref >60)
GFR calc Af Amer: >60 mL/min (ref >60)
GFR calc non Af Amer: >60 mL/min (ref >60)
GFR calc non Af Amer: >60 mL/min (ref >60)
Glucose, Bld: 182 mg/dL — ABNORMAL HIGH (ref 70–99)
Glucose, Bld: 197 mg/dL — ABNORMAL HIGH (ref 70–99)
Potassium: 3.9 mmol/L (ref 3.5–5.1)
Potassium: 3.8 mmol/L (ref 3.5–5.1)
Sodium: 136 mmol/L (ref 135–145)
Sodium: 136 mmol/L (ref 135–145)
Total Bilirubin: 0.5 mg/dL (ref 0.3–1.2)
Total Bilirubin: 0.4 mg/dL (ref 0.3–1.2)
Total Protein: 6.3 g/dL — ABNORMAL LOW (ref 6.5–8.1)
Total Protein: 5.4 g/dL — ABNORMAL LOW (ref 6.5–8.1)

## 2019-09-21 LAB — CBC WITH DIFFERENTIAL/PLATELET
Abs Immature Granulocytes: 0.03 10*3/uL (ref 0.00–0.07)
Basophils Absolute: 0 10*3/uL (ref 0.0–0.1)
Basophils Relative: 1 %
Eosinophils Absolute: 0.1 10*3/uL (ref 0.0–0.5)
Eosinophils Relative: 1 %
HCT: 38.2 % — ABNORMAL LOW (ref 39.0–52.0)
Hemoglobin: 13 g/dL (ref 13.0–17.0)
Immature Granulocytes: 1 %
Lymphocytes Relative: 33 %
Lymphs Abs: 1.7 10*3/uL (ref 0.7–4.0)
MCH: 29.5 pg (ref 26.0–34.0)
MCHC: 34 g/dL (ref 30.0–36.0)
MCV: 86.6 fL (ref 80.0–100.0)
Monocytes Absolute: 0.8 10*3/uL (ref 0.1–1.0)
Monocytes Relative: 16 %
Neutro Abs: 2.5 10*3/uL (ref 1.7–7.7)
Neutrophils Relative %: 48 %
Platelets: 205 10*3/uL (ref 150–400)
RBC: 4.41 MIL/uL (ref 4.22–5.81)
RDW: 17.8 % — ABNORMAL HIGH (ref 11.5–15.5)
WBC: 5.2 10*3/uL (ref 4.0–10.5)
nRBC: 0 % (ref 0.0–0.2)

## 2019-09-21 LAB — HEMOGLOBIN A1C
Hgb A1c MFr Bld: 7.8 % — ABNORMAL HIGH (ref 4.8–5.6)
Mean Plasma Glucose: 177.16 mg/dL

## 2019-09-21 LAB — GLUCOSE, CAPILLARY
Glucose-Capillary: 213 mg/dL — ABNORMAL HIGH (ref 70–99)
Glucose-Capillary: 184 mg/dL — ABNORMAL HIGH (ref 70–99)
Glucose-Capillary: 103 mg/dL — ABNORMAL HIGH (ref 70–99)

## 2019-09-21 LAB — SARS CORONAVIRUS 2 BY RT PCR (HOSPITAL ORDER, PERFORMED IN ~~LOC~~ HOSPITAL LAB): SARS Coronavirus 2: NEGATIVE

## 2019-09-21 LAB — MAGNESIUM: Magnesium: 1.5 mg/dL — ABNORMAL LOW (ref 1.7–2.4)

## 2019-09-21 LAB — SURGICAL PCR SCREEN
MRSA, PCR: NEGATIVE
Staphylococcus aureus: POSITIVE — AB

## 2019-09-21 LAB — HIV ANTIBODY (ROUTINE TESTING W REFLEX): HIV Screen 4th Generation wRfx: NONREACTIVE

## 2019-09-21 LAB — LACTATE DEHYDROGENASE: LDH: 123 U/L (ref 98–192)

## 2019-09-21 LAB — SAR COV2 SEROLOGY (COVID19)AB(IGG),IA: SARS-CoV-2 Ab, IgG: NONREACTIVE

## 2019-09-21 LAB — PREALBUMIN: Prealbumin: 18.2 mg/dL (ref 18–38)

## 2019-09-21 MED ORDER — MUPIROCIN 2 % EX OINT
1.0000 "application " | TOPICAL_OINTMENT | Freq: Two times a day (BID) | CUTANEOUS | Status: DC
  Administered 2019-09-21 – 2019-09-23 (×4): 1 via NASAL
  Filled 2019-09-21: qty 22

## 2019-09-21 MED ORDER — METOPROLOL SUCCINATE ER 50 MG PO TB24
50.0000 mg | ORAL_TABLET | Freq: Every day | ORAL | Status: DC
  Administered 2019-09-22 – 2019-09-23 (×2): 50 mg via ORAL
  Filled 2019-09-21 (×3): qty 1

## 2019-09-21 MED ORDER — DEXTROSE IN LACTATED RINGERS 5 % IV SOLN: INTRAVENOUS | Status: DC

## 2019-09-21 MED ORDER — INSULIN ASPART 100 UNIT/ML ~~LOC~~ SOLN: 0.0000 [IU] | Freq: Three times a day (TID) | SUBCUTANEOUS | Status: DC

## 2019-09-21 MED ORDER — INSULIN ASPART 100 UNIT/ML ~~LOC~~ SOLN
0.0000 [IU] | Freq: Three times a day (TID) | SUBCUTANEOUS | Status: DC
  Administered 2019-09-21: 4 [IU] via SUBCUTANEOUS
  Administered 2019-09-21: 7 [IU] via SUBCUTANEOUS
  Administered 2019-09-22: 4 [IU] via SUBCUTANEOUS
  Administered 2019-09-22: 3 [IU] via SUBCUTANEOUS
  Administered 2019-09-22 – 2019-09-23 (×2): 4 [IU] via SUBCUTANEOUS
  Administered 2019-09-23: 7 [IU] via SUBCUTANEOUS

## 2019-09-21 MED ORDER — INSULIN ASPART 100 UNIT/ML ~~LOC~~ SOLN
6.0000 [IU] | Freq: Three times a day (TID) | SUBCUTANEOUS | Status: DC
  Administered 2019-09-21 – 2019-09-23 (×5): 6 [IU] via SUBCUTANEOUS

## 2019-09-21 MED ORDER — ATORVASTATIN CALCIUM 40 MG PO TABS
40.0000 mg | ORAL_TABLET | Freq: Every day | ORAL | Status: DC
  Administered 2019-09-21 – 2019-09-22 (×2): 40 mg via ORAL
  Filled 2019-09-21 (×2): qty 1

## 2019-09-21 MED ORDER — SODIUM CHLORIDE 0.9% FLUSH
3.0000 mL | Freq: Two times a day (BID) | INTRAVENOUS | Status: DC
  Administered 2019-09-21 – 2019-09-23 (×2): 3 mL via INTRAVENOUS

## 2019-09-21 MED ORDER — AMLODIPINE BESYLATE 5 MG PO TABS
5.0000 mg | ORAL_TABLET | Freq: Every day | ORAL | Status: DC
  Administered 2019-09-22 – 2019-09-23 (×2): 5 mg via ORAL
  Filled 2019-09-21 (×2): qty 1

## 2019-09-21 MED ORDER — ENOXAPARIN SODIUM 40 MG/0.4ML ~~LOC~~ SOLN
40.0000 mg | SUBCUTANEOUS | Status: DC
  Administered 2019-09-21 – 2019-09-22 (×2): 40 mg via SUBCUTANEOUS
  Filled 2019-09-21 (×2): qty 0.4

## 2019-09-21 MED ORDER — INSULIN ASPART 100 UNIT/ML ~~LOC~~ SOLN: 0.0000 [IU] | Freq: Every day | SUBCUTANEOUS | Status: DC

## 2019-09-21 MED ORDER — ASPIRIN EC 81 MG PO TBEC
81.0000 mg | DELAYED_RELEASE_TABLET | Freq: Every day | ORAL | Status: DC
  Administered 2019-09-21 – 2019-09-22 (×2): 81 mg via ORAL
  Filled 2019-09-21 (×2): qty 1

## 2019-09-21 NOTE — Assessment & Plan Note (Addendum)
1.  GE junction adenocarcinoma: -PET scan on 07/20/2019 shows uptake in the mass with nonhypermetabolic gastrohepatic lymph nodes.  No metastatic disease. -Chemoradiation therapy with weekly carboplatin and paclitaxel from 07/26/2019 through 08/30/2019. -He had an appointment with Dr. Kipp Brood on Friday and will be having a J-tube placed on 09/22/2019. -Labs done on 09/21/2019 were all WNL. -We will schedule his PET/CT scan and see him back afterwards.  2.  Weight loss/dysphagia: -He reports pain in the retrosternal area when he swallows.  He could not tolerate Magic mouthwash. -He reports burning sensation when he eats. -He has tried Tums, Maalox, lidocaine gel, and Protonix.  None of which worked very well for him.  He was taken off the Prilosec by his cardiologist. -He will try to increase his calories over the next week. -He has lost 15 pounds over the last week. -He only lost 2 pounds since his last visit.  He will be having a J-tube placed by Dr. Kipp Brood on 09/22/2019.  He is being admitted to the hospital today and will have IV fluids there.

## 2019-09-21 NOTE — H&P (Signed)
[address]       [place] [address]             [phone]                    Steven Crane [facility] Medical Record #[id number] Date of Birth: [birth year]  Referring: No ref. provider found Primary Care: Merlyn Albert, MD Primary Cardiologist: Prentice Docker, MD  Chief Complaint:   No chief complaint on file.   History of Present Illness:    Steven Crane 59 y.o. male is admitted to the hospital electively for feeding tube placement.  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  He is currently undergoing neoadjuvant chemoradiation and has had significant weight loss during this process.  In hopes of getting him to surgery to place a image guided jejunostomy tube to improve his nutritional status.  Does have significant dysphagia due to the radiation and has not been able to tolerate much p.o. intake.       Past Medical History:  Diagnosis Date  . Anxiety   . Arthritis    "hands, back, knees" (08/27/2013)  . Asthma   . CHF (congestive heart failure) (HCC) 02/2010  . Chronic bronchitis (HCC)    "get it q year"  . Chronic lower back pain   . Closed head injury 1975  . Coronary artery disease May 2015   s/p successful PTCA/DES x 1 to distal RCA and PTCA/DES x 1 to OM-20 Aug 2013  . Depression   . DVT (deep venous thrombosis) (HCC) 2008   "LLE"  . Esophageal cancer (HCC)   . GERD (gastroesophageal reflux disease)   . Headache    hs of but no longer has problems   . History of kidney stones   . Hyperlipemia   . Hypertension   . Hypertriglyceridemia   . LV dysfunction    LVEF 40% at time of cardiac cath May 2015  . Morbid obesity (HCC)   . Myocardial infarction (HCC) 02/2010  . Psoriasis   . Type II diabetes mellitus (HCC)     Past Surgical History:  Procedure Laterality Date  . BIOPSY  06/17/2019   Procedure: BIOPSY;  Surgeon: Corbin Ade, MD;  [facility];  Service:  Endoscopy;;  . COLONOSCOPY  01/06/2012   Dr. Jena Gauss: suboptimal prep. normal exam. next colonoscopy in 10 years.   . CORONARY ANGIOPLASTY WITH STENT PLACEMENT  02/2010; 08/27/2013   "1 + 3"   . ESOPHAGOGASTRODUODENOSCOPY (EGD) WITH PROPOFOL N/A 06/17/2019   Procedure: ESOPHAGOGASTRODUODENOSCOPY (EGD) WITH PROPOFOL;  Surgeon: Corbin Ade, MD;  [facility];  Service: Endoscopy;  Laterality: N/A;  11:15am  . IR IMAGING GUIDED PORT INSERTION  08/03/2019  . KNEE ARTHROSCOPY Left 1981  . LEFT HEART CATHETERIZATION WITH CORONARY ANGIOGRAM N/A 08/27/2013   Procedure: LEFT HEART CATHETERIZATION WITH CORONARY ANGIOGRAM;  Surgeon: Kathleene Hazel, MD;  Location: [facility] CATH LAB;  Service: Cardiovascular;  Laterality: N/A;  . right wrist fracture surgery     . TIBIA IM NAIL INSERTION Left 08/21/2018   Procedure: INTRAMEDULLARY (IM) NAIL TIBIAL;  Surgeon: Samson Frederic, MD;  [facility]: [facility];  Service: Orthopedics;  Laterality: Left;    Family History  Problem Relation Age of Onset  . Coronary artery disease Father   . Diabetes type II Father   . Diabetes Father   . Heart attack  Father   . Hypertension Mother      Social History   Tobacco Use  Smoking Status Never Smoker  Smokeless Tobacco Current User  . Types: Snuff, Chew  Tobacco Comment   "started dipping @ age 28; quit for 5 1/2 yrs at one time; hadn't chewed in awhile"    Social History   Substance and Sexual Activity  Alcohol Use Yes  . Alcohol/week: 0.0 standard drinks   Comment: occ      Allergies  Allergen Reactions  . Adhesive [Tape] Rash    Current Facility-Administered Medications  Medication Dose Route Frequency Provider Last Rate Last Admin  . amLODipine (NORVASC) tablet 5 mg  5 mg Oral Daily Marquise Wicke, Eliezer Lofts, MD      . aspirin EC tablet 81 mg  81 mg Oral Daily Lettie Czarnecki O, MD      . atorvastatin (LIPITOR) tablet 40 mg  40 mg Oral Daily Korion Cuevas O, MD      . dextrose 5 % in  lactated ringers infusion   Intravenous Continuous Ekin Pilar O, MD      . enoxaparin (LOVENOX) injection 40 mg  40 mg Subcutaneous Q24H Mallissa Lorenzen O, MD      . insulin aspart (novoLOG) injection 0-20 Units  0-20 Units Subcutaneous TID WC Dariel Pellecchia O, MD      . insulin aspart (novoLOG) injection 0-5 Units  0-5 Units Subcutaneous QHS Brigida Scotti O, MD      . insulin aspart (novoLOG) injection 6 Units  6 Units Subcutaneous TID WC Harlean Regula O, MD      . metoprolol succinate (TOPROL-XL) 24 hr tablet 50 mg  50 mg Oral Daily Sayf Kerner O, MD      . sodium chloride flush (NS) 0.9 % injection 3 mL  3 mL Intravenous Q12H Bert Givans, Eliezer Lofts, MD        Review of Systems  Constitutional: Positive for malaise/fatigue and weight loss.  HENT: Positive for sore throat.   Respiratory: Negative for cough.   Cardiovascular: Negative.   Gastrointestinal: Positive for abdominal pain and heartburn.     PHYSICAL EXAMINATION: BP 117/78 (BP Location: Right Arm)   Pulse 94   Temp 97.9 F (36.6 C) (Oral)   Resp 18   SpO2 100%  Physical Exam  Constitutional: He is oriented to person, place, and time. No distress.  HENT:  Head: Normocephalic and atraumatic.  Eyes: No scleral icterus.  Cardiovascular: Normal rate.  Respiratory: Effort normal and breath sounds normal. No respiratory distress.  GI: Soft. He exhibits no distension.  Musculoskeletal:        General: Normal range of motion.  Neurological: He is alert and oriented to person, place, and time.  Skin: Skin is warm and dry. He is not diaphoretic.    Diagnostic Studies & Laboratory data:         I have independently reviewed the above radiology studies  and reviewed the findings with the patient.   Recent Lab Findings: Lab Results  Component Value Date   WBC 5.2 09/21/2019   HGB 13.0 09/21/2019   HCT 38.2 (L) 09/21/2019   PLT 205 09/21/2019   GLUCOSE 182 (H) 09/21/2019   CHOL 117  07/22/2019   TRIG 115 07/22/2019   HDL 34 (L) 07/22/2019   LDLCALC 62 07/22/2019   ALT 29 09/21/2019   AST 21 09/21/2019   NA 136 09/21/2019   K 3.9 09/21/2019   CL 99 09/21/2019   CREATININE  0.55 (L) 09/21/2019   BUN 7 09/21/2019   CO2 28 09/21/2019   INR 0.9 08/21/2018   HGBA1C 7.3 (H) 07/22/2019     Assessment / Plan:   59 year old male with esophageal adenocarcinoma.  He completed his neoadjuvant chemoradiation 08/26/2019.  Over the course of his treatment he has lost 7 pounds.  If he is unable to improve his nutritional status he will no longer be a surgical candidate that she is admitted with placement of a robotic assisted jejunostomy tube. I have also reached out to case management and nutrition services to aid in. his disposition.         Corliss Skains 09/21/2019 12:51 PM

## 2019-09-21 NOTE — Progress Notes (Signed)
Consent form obtained and placed in pt's chart.  ° °Lestat Golob S Tavie Haseman, RN ° °

## 2019-09-21 NOTE — Progress Notes (Deleted)
Foley cath d/ced.  Balloon deflated completely prior to d/c.  Patient tolerated well.   Left A/C NSL d/ced.  Pressure held until bleeding stopped and secure dressing placed at site.  No c/o from patient.

## 2019-09-21 NOTE — Progress Notes (Signed)
IR requested by Dr. Kipp Brood for possible image-guided percutaneous jejunostomy tube placement.  Unable to percutaneously place jejunostomy tube in IR- only able to place gastrostomy tubes or gastrostomy-jejunostomy tubes percutaneously in IR. Recommend surgical consult for jejunostomy tube placement. No plans for IR interventions at this time- will delete order. Dr. Kipp Brood made aware.  IR available in future if needed.   Bea Graff Tila Millirons, PA-C 09/21/2019, 1:25 PM

## 2019-09-21 NOTE — Patient Instructions (Signed)
[place] at [facility] Discharge Instructions  Follow up after PET scan.    Thank you for choosing [place] at [facility] to provide your oncology and hematology care.  To afford each patient quality time with our provider, please arrive at least 15 minutes before your scheduled appointment time.   If you have a lab appointment with the [place] please come in thru the Main Entrance and check in at the main information desk.  You need to re-schedule your appointment should you arrive 10 or more minutes late.  We strive to give you quality time with our providers, and arriving late affects you and other patients whose appointments are after yours.  Also, if you no show three or more times for appointments you may be dismissed from the clinic at the providers discretion.     Again, thank you for choosing [facility].  Our hope is that these requests will decrease the amount of time that you wait before being seen by our physicians.       _____________________________________________________________  Should you have questions after your visit to [facility], please contact our office at [phone] between the hours of 8:00 a.m. and 4:30 p.m.  Voicemails left after 4:00 p.m. will not be returned until the following business day.  For prescription refill requests, have your pharmacy contact our office and allow 72 hours.    Due to Covid, you will need to wear a mask upon entering the hospital. If you do not have a mask, a mask will be given to you at the Main Entrance upon arrival. For doctor visits, patients may have 1 support person with them. For treatment visits, patients can not have anyone with them due to social distancing guidelines and our immunocompromised population.

## 2019-09-21 NOTE — Progress Notes (Signed)
[place],  [id number]   CLINIC:  Medical Oncology/Hematology  PCP:  [name], [place] [address] [phone]   REASON FOR VISIT: Follow-up for adenocarcinoma of the GE junction   CURRENT THERAPY: Surveillance  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[place])  06/22/2019 Initial Diagnosis   Primary adenocarcinoma of gastroesophageal junction ([place])   07/26/2019 -  Chemotherapy   The patient had palonosetron (ALOXI) injection 0.25 mg, 0.25 mg, Intravenous,  Once, 1 of 1 cycle Administration: 0.25 mg (07/26/2019), 0.25 mg (08/02/2019), 0.25 mg (08/09/2019), 0.25 mg (08/17/2019), 0.25 mg (08/23/2019), 0.25 mg (08/30/2019) CARBOplatin (PARAPLATIN) 300 mg in sodium chloride 0.9 % 250 mL chemo infusion, 300 mg (100 % of original dose 300 mg), Intravenous,  Once, 1 of 1 cycle Dose modification:   (original dose 300 mg, Cycle 1),   (original dose 300 mg, Cycle 1),   (original dose 300 mg, Cycle 1),   (original dose 300 mg, Cycle 1),   (original dose 300 mg, Cycle 1),   (original dose 300 mg, Cycle 1) Administration: 300 mg (07/26/2019), 300 mg (08/02/2019), 300 mg (08/09/2019), 300 mg (08/17/2019), 300 mg (08/23/2019), 300 mg (08/30/2019) PACLitaxel (TAXOL) 132 mg in sodium chloride 0.9 % 250 mL chemo infusion (</= 36m/m2), 50 mg/m2 = 132 mg, Intravenous,  Once, 1 of 1 cycle Administration: 132 mg (07/26/2019), 132 mg (08/02/2019), 132 mg (08/09/2019), 132 mg (08/17/2019), 132 mg (08/23/2019), 132 mg (08/30/2019)  for chemotherapy treatment.      INTERVAL HISTORY:  Mr. CWainright59y.o. male returns for routine follow-up for adenocarcinoma of the GE junction.  Patient reports he is doing well since last visit.  He reports the burning taste in his mouth is slightly improved.  However he does still get nauseous after eating.  He does report he is drinking more of his ensures.  His weight  was somewhat stable today.  He will be having a J-tube placed tomorrow by Dr. LKipp Brood Denies any vomiting or diarrhea. Denies any new pains. Had not noticed any recent bleeding such as epistaxis, hematuria or hematochezia. Denies recent chest pain on exertion, shortness of breath on minimal exertion, pre-syncopal episodes, or palpitations. Denies any numbness or tingling in hands or feet. Denies any recent fevers, infections, or recent hospitalizations. Patient reports appetite at 25% and energy level at 25%.  He is eating well maintain his weight at this time.     REVIEW OF SYSTEMS:  Review of Systems  HENT:         Taste changes  Respiratory: Positive for cough.   Gastrointestinal: Positive for nausea.  Neurological: Positive for dizziness and headaches.  All other systems reviewed and are negative.    PAST MEDICAL/SURGICAL HISTORY:  Past Medical History:  Diagnosis Date  . Anxiety   . Arthritis    "hands, back, knees" (08/27/2013)  . Asthma   . CHF (congestive heart failure) ([place] 02/2010  . Chronic bronchitis ([place]    "get it q year"  . Chronic lower back pain   . Closed head injury 1975  . Coronary artery disease May 2015   s/p successful PTCA/DES x 1 to distal RCA and PTCA/DES x 1 to OM-20 Aug 2013  . Depression   . DVT (deep venous thrombosis) ([facility] 2008   "LLE"  . Esophageal cancer ([place]   . GERD (gastroesophageal reflux disease)   . Headache  hs of but no longer has problems   . History of kidney stones   . Hyperlipemia   . Hypertension   . Hypertriglyceridemia   . LV dysfunction    LVEF 40% at time of cardiac cath May 2015  . Morbid obesity ([place])   . Myocardial infarction ([place]) 02/2010  . Psoriasis   . Type II diabetes mellitus ([place])    Past Surgical History:  Procedure Laterality Date  . BIOPSY  06/17/2019   Procedure: BIOPSY;  Surgeon: [name], MD;  [facility];  Service: Endoscopy;;  . COLONOSCOPY  01/06/2012   Dr. [name]:  suboptimal prep. normal exam. next colonoscopy in 10 years.   . CORONARY ANGIOPLASTY WITH STENT PLACEMENT  02/2010; 08/27/2013   "1 + 3"   . ESOPHAGOGASTRODUODENOSCOPY (EGD) WITH PROPOFOL N/A 06/17/2019   Procedure: ESOPHAGOGASTRODUODENOSCOPY (EGD) WITH PROPOFOL;  Surgeon: [name], MD;  [facility];  Service: Endoscopy;  Laterality: N/A;  11:15am  . IR IMAGING GUIDED PORT INSERTION  08/03/2019  . KNEE ARTHROSCOPY Left 1981  . LEFT HEART CATHETERIZATION WITH CORONARY ANGIOGRAM N/A 08/27/2013   Procedure: LEFT HEART CATHETERIZATION WITH CORONARY ANGIOGRAM;  Surgeon: [name], MD;  Location: [facility] CATH LAB;  Service: Cardiovascular;  Laterality: N/A;  . right wrist fracture surgery     . TIBIA IM NAIL INSERTION Left 08/21/2018   Procedure: INTRAMEDULLARY (IM) NAIL TIBIAL;  Surgeon: Rod Can, MD;  Location: [facility];  Service: Orthopedics;  Laterality: Left;     SOCIAL HISTORY:  Social History   Socioeconomic History  . Marital status: Single    Spouse name: Not on file  . Number of children: 3  . Years of education: Not on file  . Highest education level: Not on file  Occupational History  . Occupation: [employer]  Tobacco Use  . Smoking status: Never Smoker  . Smokeless tobacco: Current User    Types: Snuff, Chew  . Tobacco comment: "started dipping @ age 18; quit for 5 1/2 yrs at one time; hadn't chewed in awhile"  Substance and Sexual Activity  . Alcohol use: Yes    Alcohol/week: 0.0 standard drinks    Comment: occ   . Drug use: No  . Sexual activity: Yes  Other Topics Concern  . Not on file  Social History Narrative  . Not on file   Social Determinants of Health   Financial Resource Strain: Low Risk   . Difficulty of Paying Living Expenses: Not hard at all  Food Insecurity: No Food Insecurity  . Worried About Charity fundraiser in the Last Year: Never true  . Ran Out of Food in the Last Year: Never true  Transportation Needs: No Transportation  Needs  . Lack of Transportation (Medical): No  . Lack of Transportation (Non-Medical): No  Physical Activity: Insufficiently Active  . Days of Exercise per Week: 1 day  . Minutes of Exercise per Session: 20 min  Stress: No Stress Concern Present  . Feeling of Stress : Not at all  Social Connections: Slightly Isolated  . Frequency of Communication with Friends and Family: Three times a week  . Frequency of Social Gatherings with Friends and Family: Three times a week  . Attends Religious Services: 1 to 4 times per year  . Active Member of Clubs or Organizations: No  . Attends Archivist Meetings: Never  . Marital Status: Living with partner  Intimate Partner Violence: Not At Risk  . Fear  of Current or Ex-Partner: No  . Emotionally Abused: No  . Physically Abused: No  . Sexually Abused: No    FAMILY HISTORY:  Family History  Problem Relation Age of Onset  . Coronary artery disease Father   . Diabetes type II Father   . Diabetes Father   . Heart attack Father   . Hypertension Mother     CURRENT MEDICATIONS:  Outpatient Encounter Medications as of 09/21/2019  Medication Sig  . amLODipine (NORVASC) 5 MG tablet Take 1 tablet (5 mg total) by mouth daily.  Marland Kitchen aspirin EC 81 MG tablet Take 1 tablet (81 mg total) by mouth daily.  Marland Kitchen atorvastatin (LIPITOR) 40 MG tablet Take 1 tablet by mouth once daily  . blood glucose meter kit and supplies Test glucose once daily. ICD 10 E11.9  . dexlansoprazole (DEXILANT) 60 MG capsule Take 1 capsule (60 mg total) by mouth daily as needed (reflux).  . fenofibrate 160 MG tablet Take 1 tablet (160 mg total) by mouth daily.  Marland Kitchen glyBURIDE-metformin (GLUCOVANCE) 5-500 MG tablet Take 2 tablets by mouth 2 (two) times daily.  . Lactulose 20 GM/30ML SOLN Take 30 mLs (20 g total) by mouth at bedtime. May increase to twice a day if needed.  . lidocaine (XYLOCAINE) 2 % solution Use as directed 15 mLs in the mouth or throat as needed for mouth pain.  Marland Kitchen  lidocaine (XYLOCAINE) 2 % solution 15 mL.  . lidocaine-prilocaine (EMLA) cream Apply a small amount to port a cath site and cover with plastic wrap one hour prior to infusion appointments  . metoprolol succinate (TOPROL-XL) 50 MG 24 hr tablet TAKE 1 TABLET BY MOUTH ONCE DAILY WITH OR IMMEDIATELY FOLLOWING A MEAL  . PACLITAXEL IV Inject 50 mg/m2 into the vein once a week.   . pentoxifylline (TRENTAL) 400 MG CR tablet Take 1 tablet (400 mg total) by mouth 3 (three) times daily with meals.  Marland Kitchen acetaminophen (TYLENOL) 500 MG tablet Take 1,000 mg by mouth in the morning and at bedtime.   Marland Kitchen albuterol (PROVENTIL HFA;VENTOLIN HFA) 108 (90 Base) MCG/ACT inhaler Inhale 2 puffs into the lungs every 6 (six) hours as needed for wheezing or shortness of breath. (Patient not taking: Reported on 09/21/2019)  . albuterol (VENTOLIN HFA) 108 (90 Base) MCG/ACT inhaler INHALE 2 PUFFS BY MOUTH EVERY 6 HOURS AS NEEDED FOR WHEEZING FOR SHORTNESS OF BREATH (Patient not taking: Reported on 09/21/2019)  . ALPRAZolam (XANAX) 0.5 MG tablet TAKE ONE-HALF TO ONE TABLET BY MOUTH ONCE DAILY AS NEEDED (Patient not taking: Reported on 09/21/2019)  . ondansetron (ZOFRAN ODT) 8 MG disintegrating tablet Take 1 tablet (8 mg total) by mouth every 8 (eight) hours as needed for nausea or vomiting. (Patient not taking: Reported on 09/21/2019)  . prochlorperazine (COMPAZINE) 10 MG tablet Take 1 tablet (10 mg total) by mouth every 6 (six) hours as needed for nausea or vomiting. (Patient not taking: Reported on 09/21/2019)   No facility-administered encounter medications on file as of 09/21/2019.    ALLERGIES:  Allergies  Allergen Reactions  . Adhesive [Tape] Rash     PHYSICAL EXAM:  ECOG Performance status: 1  Vitals:   09/21/19 0948  BP: 116/77  Pulse: 94  Resp: 18  Temp: 97.7 F (36.5 C)  SpO2: 98%   Filed Weights   09/21/19 0948  Weight: 236 lb 3.2 oz (107.1 kg)   Physical Exam Constitutional:      Appearance: Normal appearance.  He is normal weight.  Cardiovascular:  Rate and Rhythm: Normal rate and regular rhythm.     Heart sounds: Normal heart sounds.  Pulmonary:     Effort: Pulmonary effort is normal.     Breath sounds: Normal breath sounds.  Abdominal:     General: Bowel sounds are normal.     Palpations: Abdomen is soft.  Musculoskeletal:        General: Normal range of motion.  Skin:    General: Skin is warm.  Neurological:     Mental Status: He is alert and oriented to person, place, and time. Mental status is at baseline.  Psychiatric:        Mood and Affect: Mood normal.        Behavior: Behavior normal.        Thought Content: Thought content normal.        Judgment: Judgment normal.      LABORATORY DATA:  I have reviewed the labs as listed.  CBC    Component Value Date/Time   WBC 5.2 09/21/2019 0829   RBC 4.41 09/21/2019 0829   HGB 13.0 09/21/2019 0829   HCT 38.2 (L) 09/21/2019 0829   PLT 205 09/21/2019 0829   MCV 86.6 09/21/2019 0829   MCH 29.5 09/21/2019 0829   MCHC 34.0 09/21/2019 0829   RDW 17.8 (H) 09/21/2019 0829   LYMPHSABS 1.7 09/21/2019 0829   MONOABS 0.8 09/21/2019 0829   EOSABS 0.1 09/21/2019 0829   BASOSABS 0.0 09/21/2019 0829   CMP Latest Ref Rng & Units 09/21/2019 09/14/2019 09/10/2019  Glucose 70 - 99 mg/dL 182(H) 249(H) 233(H)  BUN 6 - 20 mg/dL '7 10 12  ' Creatinine 0.61 - 1.24 mg/dL 0.55(L) 0.59(L) 0.63  Sodium 135 - 145 mmol/L 136 130(L) 129(L)  Potassium 3.5 - 5.1 mmol/L 3.9 3.9 3.8  Chloride 98 - 111 mmol/L 99 97(L) 98  CO2 22 - 32 mmol/L '28 24 22  ' Calcium 8.9 - 10.3 mg/dL 8.8(L) 8.5(L) 8.5(L)  Total Protein 6.5 - 8.1 g/dL 6.3(L) 5.9(L) 6.1(L)  Total Bilirubin 0.3 - 1.2 mg/dL 0.5 0.6 0.6  Alkaline Phos 38 - 126 U/L 73 82 81  AST 15 - 41 U/L 21 31 34  ALT 0 - 44 U/L 29 38 46(H)     ASSESSMENT & PLAN:  Primary adenocarcinoma of gastroesophageal junction (HCC) 1.  GE junction adenocarcinoma: -PET scan on 07/20/2019 shows uptake in the mass with  nonhypermetabolic gastrohepatic lymph nodes.  No metastatic disease. -Chemoradiation therapy with weekly carboplatin and paclitaxel from 07/26/2019 through 08/30/2019. -He had an appointment with Dr. Kipp Brood on Friday and will be having a J-tube placed on 09/22/2019. -Labs done on 09/21/2019 were all WNL. -We will schedule his PET/CT scan and see him back afterwards.  2.  Weight loss/dysphagia: -He reports pain in the retrosternal area when he swallows.  He could not tolerate Magic mouthwash. -He reports burning sensation when he eats. -He has tried Tums, Maalox, lidocaine gel, and Protonix.  None of which worked very well for him.  He was taken off the Prilosec by his cardiologist. -He will try to increase his calories over the next week. -He has lost 15 pounds over the last week. -He only lost 2 pounds since his last visit.  He will be having a J-tube placed by Dr. Kipp Brood on 09/22/2019.  He is being admitted to the hospital today and will have IV fluids there.     Orders placed this encounter:  Orders Placed This Encounter  Procedures  . NM PET  Image Restag (PS) Skull Base To Thigh  . Lactate dehydrogenase  . Magnesium  . CBC with Differential/Platelet  . Comprehensive metabolic panel  . TSH      Francene Finders, FNP-C [employer] [phone]

## 2019-09-21 NOTE — Progress Notes (Addendum)
Pt arrived to rm [id number] from doctor office. VSS. Paged MD. Chryl Heck for MD order to put in.   Updated:  Order received. Initiated tele. CHG and assessment performed. Initiated IV. Call bell within reach.   Lavenia Atlas, RN

## 2019-09-21 NOTE — Progress Notes (Signed)
Labs reviewed with and pt seen by RLockamy NP and pt is not going to receive any IV hydration today per NP

## 2019-09-22 ENCOUNTER — Encounter (HOSPITAL_COMMUNITY): Payer: Self-pay | Admitting: Certified Registered Nurse Anesthetist

## 2019-09-22 ENCOUNTER — Ambulatory Visit: Payer: BC Managed Care – PPO | Admitting: Urology

## 2019-09-22 ENCOUNTER — Encounter (HOSPITAL_COMMUNITY): Payer: Self-pay | Admitting: Thoracic Surgery (Cardiothoracic Vascular Surgery)

## 2019-09-22 DIAGNOSIS — C153 Malignant neoplasm of upper third of esophagus: Secondary | ICD-10-CM

## 2019-09-22 LAB — GLUCOSE, CAPILLARY
Glucose-Capillary: 139 mg/dL — ABNORMAL HIGH (ref 70–99)
Glucose-Capillary: 195 mg/dL — ABNORMAL HIGH (ref 70–99)
Glucose-Capillary: 191 mg/dL — ABNORMAL HIGH (ref 70–99)
Glucose-Capillary: 169 mg/dL — ABNORMAL HIGH (ref 70–99)
Glucose-Capillary: 165 mg/dL — ABNORMAL HIGH (ref 70–99)

## 2019-09-22 MED ORDER — BOOST PLUS PO LIQD
237.0000 mL | Freq: Two times a day (BID) | ORAL | Status: DC
  Filled 2019-09-22 (×4): qty 237

## 2019-09-22 NOTE — Progress Notes (Addendum)
Initial Nutrition Assessment  DOCUMENTATION CODES:   Not applicable  INTERVENTION:   Once J-tube placed:  -Osmolite 1.5 @ 20 ml/hr -Increase 10 ml Q4 hours to goal rate of 65 ml/hr (1560 ml) -30 ml Prostat BID  At goal TF provides: 2540 kcals, 128 grams protein, 1189 ml free water.   Try Boost Plus chocolate BID- Each supplement provides 360kcal and 14g protein.    NUTRITION DIAGNOSIS:   Increased nutrient needs related to cancer and cancer related treatments as evidenced by estimated needs.  GOAL:   Patient will meet greater than or equal to 90% of their needs  MONITOR:   Skin, TF tolerance, Weight trends, Labs, I & O's, PO intake, Supplement acceptance  REASON FOR ASSESSMENT:   Consult Enteral/tube feeding initiation and management  ASSESSMENT:   Patient with PMH significant for CHF, asthma, DM, MI, CAD, HLD, HTN, depression, and GE junction adenocarcinoma undergoing chemotherapy/radation.   Plan for J-tube tomorrow.   RD working remotely. Unable to reach pt by phone. Pt followed by outpatient [place] [facility]. Pt recently reports burning in esophagus with food/liquids. Drinking premier protein intermittently and was advised to start higher calorie high protein shake. Had a difficult time finding one he enjoys. Had intermittent issues with constipation. Had BM yesterday. Last meal completion charted as 75%. RD to add supplements to maximize kcal and protein.   Plan titration of tube feeding as pt is at risk for refeeding. Will attempt to discuss home plan prior to discharge.   Records indicate pt weighed 301 lb one year ago and 236 lb this admission (22% wt loss in one year, significant for time frame). Suspect severe malnutrition but unable to diagnosis without intake recall and NFPE.   Drips: D5 in LR @ 500 ml/hr  Medications: SS novolog  Labs: CBG 103-213   Diet Order:   Diet Order            DIET SOFT Room service appropriate? Yes; Fluid consistency: Thin   Diet effective now              EDUCATION NEEDS:   Not appropriate for education at this time  Skin:     Last BM:  6/1  Height:   Ht Readings from Last 1 Encounters:  09/22/19 6\' 2"  (1.88 m)    Weight:   Wt Readings from Last 1 Encounters:  09/21/19 107.1 kg    BMI:  Body mass index is 30.33 kg/m.  Estimated Nutritional Needs:   Kcal:  2500-2700 kcal  Protein:  125-145 grams  Fluid:  >/= 2.5 L/day   Mariana Single RD, LDN Clinical Nutrition Pager listed in [place]

## 2019-09-22 NOTE — Progress Notes (Addendum)
      [address]       [place] [address]             [phone]        Procedure(s) (LRB): Robotic Assisted LAPAROSCOPIC JEJUNOSTOMY tube placement (N/A) Subjective: Feels ok, was able to eat some yesterday  Objective: Vital signs in last 24 hours: Temp:  [97.6 F (36.4 C)-98.8 F (37.1 C)] 97.6 F (36.4 C) (06/02 0820) Pulse Rate:  [71-95] 71 (06/02 0519) Cardiac Rhythm: Normal sinus rhythm (06/02 0816) Resp:  [12-25] 16 (06/02 0820) BP: (114-128)/(73-85) 128/81 (06/02 0820) SpO2:  [98 %-100 %] 99 % (06/02 0820) Weight:  [107.1 kg] 107.1 kg (06/01 1422)  Hemodynamic parameters for last 24 hours:    Intake/Output from previous day: 06/01 0701 - 06/02 0700 In: 797.2 [P.O.:120; I.V.:677.2] Out: 1200 [Urine:1200] Intake/Output this shift: Total I/O In: 400 [P.O.:400] Out: -   General appearance: alert, cooperative and no distress Heart: regular rate and rhythm Lungs: clear to auscultation bilaterally Abdomen: benign  Lab Results: Recent Labs    09/21/19 0829 09/21/19 1414  WBC 5.2 4.8  HGB 13.0 11.7*  HCT 38.2* 34.0*  PLT 205 180   BMET:  Recent Labs    09/21/19 0829 09/21/19 1414  NA 136 136  K 3.9 3.8  CL 99 99  CO2 28 26  GLUCOSE 182* 197*  BUN 7 8  CREATININE 0.55* 0.61  CALCIUM 8.8* 8.9    PT/INR: No results for input(s): LABPROT, INR in the last 72 hours. ABG No results found for: PHART, HCO3, TCO2, ACIDBASEDEF, O2SAT CBG (last 3)  Recent Labs    09/21/19 1635 09/21/19 2117 09/22/19 0629  GLUCAP 184* 103* 139*    Meds Scheduled Meds: . amLODipine  5 mg Oral Daily  . aspirin EC  81 mg Oral Daily  . atorvastatin  40 mg Oral Daily  . enoxaparin (LOVENOX) injection  40 mg Subcutaneous Q24H  . insulin aspart  0-20 Units Subcutaneous TID WC  . insulin aspart  0-5 Units Subcutaneous QHS  . insulin aspart  6 Units Subcutaneous TID WC  . metoprolol succinate  50 mg Oral Daily  . mupirocin ointment  1 application  Nasal BID  . sodium chloride flush  3 mL Intravenous Q12H   Continuous Infusions: . dextrose 5% lactated ringers 50 mL/hr at 09/22/19 0400   PRN Meds:.  Xrays No results found.  Assessment/Plan: S/P Procedure(s) (LRB): Robotic Assisted LAPAROSCOPIC JEJUNOSTOMY tube placement (N/A)  1 afeb, VSS 2 sinus rhythm 3 eating some 4 plan for robotic assisted J tube placement for tomorrow   LOS: 1 day    John Giovanni PA-C Pager [phone] 09/22/2019  Agree with above. Patient is tolerated soft diet while in the hospital We discussed options given his improved symptoms as to whether or not we need to proceed with a jejunostomy. Will reassess his symptoms after lunch and dinner.  If he is able to tolerate this without major issues then we can potentially send him home tomorrow without the need for jejunostomy tube.  Sotiria Keast Bary Leriche

## 2019-09-23 LAB — GLUCOSE, CAPILLARY
Glucose-Capillary: 188 mg/dL — ABNORMAL HIGH (ref 70–99)
Glucose-Capillary: 243 mg/dL — ABNORMAL HIGH (ref 70–99)

## 2019-09-23 SURGERY — INSERTION, GASTROSTOMY TUBE, ROBOT-ASSISTED
Anesthesia: General

## 2019-09-23 NOTE — Progress Notes (Signed)
Order received to discharge patient.  Telemetry monitor removed and CCMD notified.  PIV access removed.  Discharge instructions, follow up, medications and instructions for their use discussed with patient. 

## 2019-09-23 NOTE — Discharge Instructions (Signed)
May continue with soft diet and supplements as recommended by dietician.

## 2019-09-23 NOTE — Discharge Summary (Signed)
Physician Discharge Summary  Patient ID: Steven Crane MRN: [id number] DOB/AGE: 59/11/1960 59 y.o.  Admit date: 09/21/2019 Discharge date: 09/23/2019  Admission Diagnoses: Failure to thrive Adenocarcinoma of the gastro-esophageal junction Malnutrition Dibetes mellitus Hypertension History of DVT Coronary artery disease, s/p PTCI 2011  Discharge Diagnoses:   Failure to thrive Adenocarcinoma of the gastro-esophageal junction Malnutrition Dibetes mellitus Hypertension History of DVT Coronary artery disease, s/p PTCI 2011  Discharged Condition: stable  History of Present Illness:    Steven Crane 59 y.o. male is admitted to the hospital electively for feeding tube placement.  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  He is currently undergoing neoadjuvant chemoradiation and has had significant weight loss during this process.  In hopes of getting him to surgery to place an image guided jejunostomy tube to improve his nutritional status.  Does have significant dysphagia due to the radiation and has not been able to tolerate much p.o. intake.   Hospital Course:   After admission to the hospital, Steven Crane was observed to have improved oral intake with a soft diet. The patient attributes this to the radiation treatments having decreased the size of the tumor.  After discussion with the patient, Dr. Kipp Crane recommended forgoing further efforts at this time to place a feeding tube and allow Steven Crane to return home on a soft diet. He will continue with his chemo-radiation treatments that have already been established. He will follow up in our office in 3 weeks.    Consults: None  Significant Diagnostic Studies: none  Treatments:  none  Discharge Exam: Blood pressure 128/85, pulse 75, temperature 97.7 F (36.5 C), temperature source Oral, resp. rate 11, height '6\' 2"'  (1.88 m), weight 107.1 kg, SpO2 97 %.  General appearance: alert,  cooperative and no distress Heart: regular rate and rhythm Lungs: clear to auscultation bilaterally Abdomen: benign  Disposition:    Allergies as of 09/23/2019      Reactions   Adhesive [tape] Rash      Medication List    TAKE these medications   acetaminophen 500 MG tablet Commonly known as: TYLENOL Take 1,000 mg by mouth in the morning and at bedtime.   albuterol 108 (90 Base) MCG/ACT inhaler Commonly known as: VENTOLIN HFA Inhale 2 puffs into the lungs every 6 (six) hours as needed for wheezing or shortness of breath.   ALPRAZolam 0.5 MG tablet Commonly known as: XANAX TAKE ONE-HALF TO ONE TABLET BY MOUTH ONCE DAILY AS NEEDED What changed:   how much to take  how to take this  when to take this  reasons to take this  additional instructions   amLODipine 5 MG tablet Commonly known as: NORVASC Take 1 tablet (5 mg total) by mouth daily.   aspirin EC 81 MG tablet Take 1 tablet (81 mg total) by mouth daily. What changed: when to take this   atorvastatin 40 MG tablet Commonly known as: LIPITOR Take 1 tablet by mouth once daily   blood glucose meter kit and supplies Test glucose once daily. ICD 10 E11.9   calcium carbonate 500 MG chewable tablet Commonly known as: TUMS - dosed in mg elemental calcium Chew 2 tablets by mouth 2 (two) times daily as needed for indigestion or heartburn.   Dexilant 60 MG capsule Generic drug: dexlansoprazole Take 1 capsule (60 mg total) by mouth daily as needed (reflux). What changed:   how much to take  when to take this  fenofibrate 160 MG tablet Take 1 tablet (160 mg total) by mouth daily. What changed: when to take this   glyBURIDE-metformin 5-500 MG tablet Commonly known as: GLUCOVANCE Take 2 tablets by mouth 2 (two) times daily.   Lactulose 20 GM/30ML Soln Take 30 mLs (20 g total) by mouth at bedtime. May increase to twice a day if needed.   lidocaine 2 % solution Commonly known as: XYLOCAINE Use as directed  15 mLs in the mouth or throat as needed for mouth pain.   lidocaine-prilocaine cream Commonly known as: EMLA Apply a small amount to port a cath site and cover with plastic wrap one hour prior to infusion appointments   metoprolol succinate 50 MG 24 hr tablet Commonly known as: TOPROL-XL TAKE 1 TABLET BY MOUTH ONCE DAILY WITH OR IMMEDIATELY FOLLOWING A MEAL What changed:   how much to take  how to take this  when to take this  additional instructions   ondansetron 8 MG disintegrating tablet Commonly known as: Zofran ODT Take 1 tablet (8 mg total) by mouth every 8 (eight) hours as needed for nausea or vomiting.   pentoxifylline 400 MG CR tablet Commonly known as: TRENTAL Take 1 tablet (400 mg total) by mouth 3 (three) times daily with meals.   prochlorperazine 10 MG tablet Commonly known as: COMPAZINE Take 1 tablet (10 mg total) by mouth every 6 (six) hours as needed for nausea or vomiting.      Follow-up Information    Lajuana Matte, MD. Go on 10/15/2019.   Specialty: Cardiothoracic Surgery Why: Your appointment is at 1:15pm. Contact information: [address] [place] [address] [phone]           Signed: Antony Odea, PA-C 09/23/2019, 10:49 AM

## 2019-09-23 NOTE — Progress Notes (Signed)
      [address]       [place] [address]             [phone]         Procedure(s) (LRB): Robotic Assisted LAPAROSCOPIC JEJUNOSTOMY tube placement (N/A) Subjective: Says he feels good, he has had progressive improvement in his oral intake for the past 3 days. He tolerated a soft diet this morning.    Objective: Vital signs in last 24 hours: Temp:  [97.7 F (36.5 C)-98.4 F (36.9 C)] 97.7 F (36.5 C) (06/03 0749) Pulse Rate:  [75-96] 75 (06/03 0749) Cardiac Rhythm: Normal sinus rhythm (06/03 0802) Resp:  [11-19] 11 (06/03 0749) BP: (122-133)/(74-85) 128/85 (06/03 0749) SpO2:  [96 %-100 %] 97 % (06/03 0749)    Intake/Output from previous day: 06/02 0701 - 06/03 0700 In: 400 [P.O.:400] Out: 6625 [Urine:6625] Intake/Output this shift: No intake/output data recorded.  General appearance: alert, cooperative and no distress Heart: regular rate and rhythm Abdomen: benign  Lab Results: Recent Labs    09/21/19 0829 09/21/19 1414  WBC 5.2 4.8  HGB 13.0 11.7*  HCT 38.2* 34.0*  PLT 205 180   BMET:  Recent Labs    09/21/19 0829 09/21/19 1414  NA 136 136  K 3.9 3.8  CL 99 99  CO2 28 26  GLUCOSE 182* 197*  BUN 7 8  CREATININE 0.55* 0.61  CALCIUM 8.8* 8.9    PT/INR: No results for input(s): LABPROT, INR in the last 72 hours. ABG No results found for: PHART, HCO3, TCO2, ACIDBASEDEF, O2SAT CBG (last 3)  Recent Labs    09/22/19 2114 09/23/19 0627 09/23/19 1130  GLUCAP 165* 188* 243*    Assessment/Plan: S/P Procedure(s) (LRB): Robotic Assisted LAPAROSCOPIC JEJUNOSTOMY tube placement (N/A)  -Malnutrition and failure to thrive due to adenocarcinoma at the GE junction and difficulty swallowing. He has had progressive improvement in his oral intake over the past 3 days and is now tolerating a soft diet. Dr. Kipp Brood discussed this with the patient and the decision was made to forego placement of a feeding tube at this time. Surgery  cancelled.  Will discharge to home on a soft diet, f/u in offie later this month.     LOS: 2 days    Antony Odea, PA-C [phone] 09/23/2019

## 2019-09-29 ENCOUNTER — Inpatient Hospital Stay (HOSPITAL_COMMUNITY): Payer: BC Managed Care – PPO

## 2019-09-29 ENCOUNTER — Ambulatory Visit (HOSPITAL_COMMUNITY): Payer: BC Managed Care – PPO | Admitting: Hematology

## 2019-09-29 ENCOUNTER — Other Ambulatory Visit: Payer: Self-pay

## 2019-09-29 DIAGNOSIS — C160 Malignant neoplasm of cardia: Secondary | ICD-10-CM | POA: Diagnosis not present

## 2019-09-29 DIAGNOSIS — R634 Abnormal weight loss: Secondary | ICD-10-CM | POA: Diagnosis not present

## 2019-09-29 DIAGNOSIS — K5900 Constipation, unspecified: Secondary | ICD-10-CM | POA: Diagnosis not present

## 2019-09-29 DIAGNOSIS — E119 Type 2 diabetes mellitus without complications: Secondary | ICD-10-CM | POA: Diagnosis not present

## 2019-09-29 DIAGNOSIS — Z923 Personal history of irradiation: Secondary | ICD-10-CM | POA: Diagnosis not present

## 2019-09-29 DIAGNOSIS — F17290 Nicotine dependence, other tobacco product, uncomplicated: Secondary | ICD-10-CM | POA: Diagnosis not present

## 2019-09-29 DIAGNOSIS — Z9221 Personal history of antineoplastic chemotherapy: Secondary | ICD-10-CM | POA: Diagnosis not present

## 2019-09-29 DIAGNOSIS — Z794 Long term (current) use of insulin: Secondary | ICD-10-CM | POA: Diagnosis not present

## 2019-09-29 DIAGNOSIS — R1310 Dysphagia, unspecified: Secondary | ICD-10-CM | POA: Diagnosis not present

## 2019-09-29 DIAGNOSIS — I110 Hypertensive heart disease with heart failure: Secondary | ICD-10-CM | POA: Diagnosis not present

## 2019-09-29 LAB — CBC WITH DIFFERENTIAL/PLATELET
Abs Immature Granulocytes: 0.01 10*3/uL (ref 0.00–0.07)
Basophils Absolute: 0 10*3/uL (ref 0.0–0.1)
Basophils Relative: 1 %
Eosinophils Absolute: 0.1 10*3/uL (ref 0.0–0.5)
Eosinophils Relative: 1 %
HCT: 35.8 % — ABNORMAL LOW (ref 39.0–52.0)
Hemoglobin: 11.9 g/dL — ABNORMAL LOW (ref 13.0–17.0)
Immature Granulocytes: 0 %
Lymphocytes Relative: 25 %
Lymphs Abs: 1.7 10*3/uL (ref 0.7–4.0)
MCH: 29.7 pg (ref 26.0–34.0)
MCHC: 33.2 g/dL (ref 30.0–36.0)
MCV: 89.3 fL (ref 80.0–100.0)
Monocytes Absolute: 0.6 10*3/uL (ref 0.1–1.0)
Monocytes Relative: 9 %
Neutro Abs: 4.3 10*3/uL (ref 1.7–7.7)
Neutrophils Relative %: 64 %
Platelets: 184 10*3/uL (ref 150–400)
RBC: 4.01 MIL/uL — ABNORMAL LOW (ref 4.22–5.81)
RDW: 19 % — ABNORMAL HIGH (ref 11.5–15.5)
WBC: 6.7 10*3/uL (ref 4.0–10.5)
nRBC: 0 % (ref 0.0–0.2)

## 2019-09-29 LAB — TSH: TSH: 1.792 u[IU]/mL (ref 0.350–4.500)

## 2019-09-29 LAB — COMPREHENSIVE METABOLIC PANEL
ALT: 31 U/L (ref 0–44)
AST: 22 U/L (ref 15–41)
Albumin: 3.4 g/dL — ABNORMAL LOW (ref 3.5–5.0)
Alkaline Phosphatase: 69 U/L (ref 38–126)
Anion gap: 10 (ref 5–15)
BUN: 9 mg/dL (ref 6–20)
CO2: 24 mmol/L (ref 22–32)
Calcium: 8.9 mg/dL (ref 8.9–10.3)
Chloride: 100 mmol/L (ref 98–111)
Creatinine, Ser: 0.59 mg/dL — ABNORMAL LOW (ref 0.61–1.24)
GFR calc Af Amer: >60 mL/min (ref >60)
GFR calc non Af Amer: >60 mL/min (ref >60)
Glucose, Bld: 194 mg/dL — ABNORMAL HIGH (ref 70–99)
Potassium: 4 mmol/L (ref 3.5–5.1)
Sodium: 134 mmol/L — ABNORMAL LOW (ref 135–145)
Total Bilirubin: 0.4 mg/dL (ref 0.3–1.2)
Total Protein: 6.4 g/dL — ABNORMAL LOW (ref 6.5–8.1)

## 2019-09-29 LAB — MAGNESIUM: Magnesium: 1.5 mg/dL — ABNORMAL LOW (ref 1.7–2.4)

## 2019-09-29 LAB — LACTATE DEHYDROGENASE: LDH: 127 U/L (ref 98–192)

## 2019-09-29 MED ORDER — MAGNESIUM SULFATE 2 GM/50ML IV SOLN
INTRAVENOUS | Status: AC
  Filled 2019-09-29: qty 50

## 2019-09-29 MED ORDER — HEPARIN SOD (PORK) LOCK FLUSH 100 UNIT/ML IV SOLN
500.0000 [IU] | Freq: Once | INTRAVENOUS | Status: AC
  Administered 2019-09-29: 500 [IU] via INTRAVENOUS

## 2019-09-29 MED ORDER — MAGNESIUM SULFATE 2 GM/50ML IV SOLN
2.0000 g | Freq: Once | INTRAVENOUS | Status: AC
  Administered 2019-09-29: 2 g via INTRAVENOUS

## 2019-09-29 MED ORDER — SODIUM CHLORIDE 0.9% FLUSH
10.0000 mL | Freq: Once | INTRAVENOUS | Status: AC
  Administered 2019-09-29: 10 mL via INTRAVENOUS

## 2019-09-29 MED ORDER — SODIUM CHLORIDE 0.9 % IV SOLN: Freq: Once | INTRAVENOUS | Status: AC

## 2019-09-29 NOTE — Progress Notes (Signed)
Labs reviewed with Francene Finders NP today. Will give 2 grams of magnesium today per orders.    Patient tolerated it well without problems. Vitals stable and discharged home from clinic ambulatory. Follow up as scheduled.

## 2019-09-29 NOTE — Patient Instructions (Signed)
[facility] at [facility]  Discharge Instructions:   _______________________________________________________________  Thank you for choosing [facility] at [facility] to provide your oncology and hematology care.  To afford each patient quality time with our providers, please arrive at least 15 minutes before your scheduled appointment.  You need to re-schedule your appointment if you arrive 10 or more minutes late.  We strive to give you quality time with our providers, and arriving late affects you and other patients whose appointments are after yours.  Also, if you no show three or more times for appointments you may be dismissed from the clinic.  Again, thank you for choosing [facility]. Our hope is that these requests will allow you access to exceptional care and in a timely manner. _______________________________________________________________  If you have questions after your visit, please contact our office at [phone] between the hours of 8:30 a.m. and 5:00 p.m. Voicemails left after 4:30 p.m. will not be returned until the following business day. _______________________________________________________________  For prescription refill requests, have your pharmacy contact our office. _______________________________________________________________  Recommendations made by the consultant and any test results will be sent to your referring physician. _______________________________________________________________ 

## 2019-10-01 ENCOUNTER — Encounter: Payer: Self-pay | Admitting: Thoracic Surgery (Cardiothoracic Vascular Surgery)

## 2019-10-01 ENCOUNTER — Ambulatory Visit (HOSPITAL_COMMUNITY)
Admission: RE | Admit: 2019-10-01 | Discharge: 2019-10-01 | Disposition: A | Discharge status: Home / Self Care | Payer: BC Managed Care – PPO | Source: Ambulatory Visit | Attending: Nurse Practitioner | Admitting: Nurse Practitioner

## 2019-10-01 ENCOUNTER — Ambulatory Visit: Payer: Self-pay | Admitting: Thoracic Surgery (Cardiothoracic Vascular Surgery)

## 2019-10-01 ENCOUNTER — Ambulatory Visit (HOSPITAL_COMMUNITY): Payer: BC Managed Care – PPO

## 2019-10-01 ENCOUNTER — Other Ambulatory Visit: Payer: Self-pay

## 2019-10-01 VITALS — BP 116/80 | HR 95 | Temp 97.9°F | Resp 20 | Ht 74.0 in | Wt 237.4 lb

## 2019-10-01 DIAGNOSIS — C160 Malignant neoplasm of cardia: Secondary | ICD-10-CM | POA: Diagnosis not present

## 2019-10-01 LAB — GLUCOSE, CAPILLARY: Glucose-Capillary: 153 mg/dL — ABNORMAL HIGH (ref 70–99)

## 2019-10-01 MED ORDER — FLUDEOXYGLUCOSE F - 18 (FDG) INJECTION
11.6800 | Freq: Once | INTRAVENOUS | Status: AC | PRN
  Administered 2019-10-01: 11.68 via INTRAVENOUS

## 2019-10-01 NOTE — Progress Notes (Signed)
     [address]       [place] [address]             [phone]       Mr. Steven Crane presents today.  Since being home, he has had much more success with eating.  His weight is up about 7 lbs since 5/25.  He underwent his PET/CT, and results are pending, but on my personal read, there does not appear to be any distant disease.  There is still some activity in the esophagus, but this might be reactive.  We discussed the risks and benefits of a robotic assisted Ivor Lewis esophagectomy, and he is agreeble to proceed at the end of the months.  Malala Trenkamp Bary Leriche

## 2019-10-01 NOTE — Progress Notes (Signed)
Nutrition Follow-up:  Patient with esophageal cancer.  Patient has completed chemotherapy and radiation.   Spoke with patient via phone.  Noted patient admitted but for J-tube placement but did not have it due to eating better.  Patient reports that his appetite is better and swallowing is better.  Reports today ate 8 oz hamburger steak with mashed potatoes and green beans for lunch.  Has been able to eat eggs with sausage, oatmeal or grits for breakfast.  Has been eating potato soup, chicken and dumplings.  Has been trying to drink one protein drink a day.  Reports no nausea, bowels are moving better.  Reports bad/burnt taste is better.      Medications: reviewed  Labs: reviewed  Anthropometrics:   Weight per patient report 239 lb on 6/9 when at the cancer center.     NUTRITION DIAGNOSIS: Inadequate oral intake improving   INTERVENTION:  Reviewed ways to add calories and protein. Encouraged small frequent meals Encouraged oral supplements as tolerated for additional calories. Recommend adding MVI Discussed importance of improving nutrition prior to surgery.  Patient has contact information    MONITORING, EVALUATION, GOAL: weight trends, intake   NEXT VISIT: July 2nd phone f/u  Imojean Yoshino B. Zenia Resides, [place], [place] Registered Dietitian [phone] (pager)

## 2019-10-03 ENCOUNTER — Other Ambulatory Visit: Payer: Self-pay | Admitting: *Deleted

## 2019-10-03 ENCOUNTER — Encounter: Payer: Self-pay | Admitting: *Deleted

## 2019-10-03 DIAGNOSIS — C159 Malignant neoplasm of esophagus, unspecified: Secondary | ICD-10-CM

## 2019-10-04 ENCOUNTER — Encounter: Payer: Self-pay | Admitting: *Deleted

## 2019-10-04 ENCOUNTER — Other Ambulatory Visit: Payer: Self-pay | Admitting: Thoracic Surgery (Cardiothoracic Vascular Surgery)

## 2019-10-04 ENCOUNTER — Other Ambulatory Visit: Payer: Self-pay

## 2019-10-04 ENCOUNTER — Inpatient Hospital Stay (HOSPITAL_COMMUNITY): Payer: BC Managed Care – PPO | Admitting: Hematology

## 2019-10-04 ENCOUNTER — Inpatient Hospital Stay (HOSPITAL_COMMUNITY): Payer: BC Managed Care – PPO

## 2019-10-04 VITALS — BP 135/78 | HR 87 | Temp 97.1°F | Resp 18 | Wt 240.0 lb

## 2019-10-04 DIAGNOSIS — C160 Malignant neoplasm of cardia: Secondary | ICD-10-CM | POA: Diagnosis not present

## 2019-10-04 DIAGNOSIS — C159 Malignant neoplasm of esophagus, unspecified: Secondary | ICD-10-CM

## 2019-10-04 DIAGNOSIS — Z9861 Coronary angioplasty status: Secondary | ICD-10-CM

## 2019-10-04 LAB — COMPREHENSIVE METABOLIC PANEL
ALT: 24 U/L (ref 0–44)
AST: 22 U/L (ref 15–41)
Albumin: 3.6 g/dL (ref 3.5–5.0)
Alkaline Phosphatase: 57 U/L (ref 38–126)
Anion gap: 12 (ref 5–15)
BUN: 13 mg/dL (ref 6–20)
CO2: 23 mmol/L (ref 22–32)
Calcium: 8.9 mg/dL (ref 8.9–10.3)
Chloride: 101 mmol/L (ref 98–111)
Creatinine, Ser: 0.51 mg/dL — ABNORMAL LOW (ref 0.61–1.24)
GFR calc Af Amer: >60 mL/min (ref >60)
GFR calc non Af Amer: >60 mL/min (ref >60)
Glucose, Bld: 171 mg/dL — ABNORMAL HIGH (ref 70–99)
Potassium: 3.6 mmol/L (ref 3.5–5.1)
Sodium: 136 mmol/L (ref 135–145)
Total Bilirubin: 0.5 mg/dL (ref 0.3–1.2)
Total Protein: 6.5 g/dL (ref 6.5–8.1)

## 2019-10-04 LAB — CBC WITH DIFFERENTIAL/PLATELET
Abs Immature Granulocytes: 0.02 10*3/uL (ref 0.00–0.07)
Basophils Absolute: 0 10*3/uL (ref 0.0–0.1)
Basophils Relative: 1 %
Eosinophils Absolute: 0.2 10*3/uL (ref 0.0–0.5)
Eosinophils Relative: 3 %
HCT: 35.2 % — ABNORMAL LOW (ref 39.0–52.0)
Hemoglobin: 11.9 g/dL — ABNORMAL LOW (ref 13.0–17.0)
Immature Granulocytes: 0 %
Lymphocytes Relative: 23 %
Lymphs Abs: 1.5 10*3/uL (ref 0.7–4.0)
MCH: 30.7 pg (ref 26.0–34.0)
MCHC: 33.8 g/dL (ref 30.0–36.0)
MCV: 91 fL (ref 80.0–100.0)
Monocytes Absolute: 0.7 10*3/uL (ref 0.1–1.0)
Monocytes Relative: 11 %
Neutro Abs: 4.2 10*3/uL (ref 1.7–7.7)
Neutrophils Relative %: 62 %
Platelets: 192 10*3/uL (ref 150–400)
RBC: 3.87 MIL/uL — ABNORMAL LOW (ref 4.22–5.81)
RDW: 19 % — ABNORMAL HIGH (ref 11.5–15.5)
WBC: 6.6 10*3/uL (ref 4.0–10.5)
nRBC: 0 % (ref 0.0–0.2)

## 2019-10-04 LAB — MAGNESIUM: Magnesium: 1.7 mg/dL (ref 1.7–2.4)

## 2019-10-04 NOTE — Patient Instructions (Addendum)
[facility] at [facility] Discharge Instructions  You were seen today by Dr. Delton Coombes. He went over your recent results. Dr. Delton Coombes will see you back in 6-8 weeks for labs and follow up.   Thank you for choosing [place] at [facility] to provide your oncology and hematology care.  To afford each patient quality time with our provider, please arrive at least 15 minutes before your scheduled appointment time.   If you have a lab appointment with the [facility] please come in thru the Main Entrance and check in at the main information desk  You need to re-schedule your appointment should you arrive 10 or more minutes late.  We strive to give you quality time with our providers, and arriving late affects you and other patients whose appointments are after yours.  Also, if you no show three or more times for appointments you may be dismissed from the clinic at the providers discretion.     Again, thank you for choosing [facility].  Our hope is that these requests will decrease the amount of time that you wait before being seen by our physicians.       _____________________________________________________________  Should you have questions after your visit to [facility], please contact our office at [phone] between the hours of 8:00 a.m. and 4:30 p.m.  Voicemails left after 4:00 p.m. will not be returned until the following business day.  For prescription refill requests, have your pharmacy contact our office and allow 72 hours.    Cancer Center Support Programs:   > Cancer Support Group  2nd Tuesday of the month 1pm-2pm, Journey Room

## 2019-10-04 NOTE — Progress Notes (Signed)
Steven Crane, [place] [address]   CLINIC:  Medical Oncology/Hematology  PCP:  Erven Colla, DO [address] / [place] [address] [phone]   REASON FOR VISIT:  Follow-up for GE junction adenocarcinoma  PRIOR THERAPY: Weekly carboplatin and paclitaxel from 07/26/2019 through 08/30/2019.  CURRENT THERAPY: Completed chemoradiation therapy.  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[place])   07/26/2019 -  Chemotherapy   The patient had palonosetron (ALOXI) injection 0.25 mg, 0.25 mg, Intravenous,  Once, 1 of 1 cycle Administration: 0.25 mg (07/26/2019), 0.25 mg (08/02/2019), 0.25 mg (08/09/2019), 0.25 mg (08/17/2019), 0.25 mg (08/23/2019), 0.25 mg (08/30/2019) CARBOplatin (PARAPLATIN) 300 mg in sodium chloride 0.9 % 250 mL chemo infusion, 300 mg (100 % of original dose 300 mg), Intravenous,  Once, 1 of 1 cycle Dose modification:   (original dose 300 mg, Cycle 1),   (original dose 300 mg, Cycle 1),   (original dose 300 mg, Cycle 1),   (original dose 300 mg, Cycle 1),   (original dose 300 mg, Cycle 1),   (original dose 300 mg, Cycle 1) Administration: 300 mg (07/26/2019), 300 mg (08/02/2019), 300 mg (08/09/2019), 300 mg (08/17/2019), 300 mg (08/23/2019), 300 mg (08/30/2019) PACLitaxel (TAXOL) 132 mg in sodium chloride 0.9 % 250 mL chemo infusion (</= 33m/m2), 50 mg/m2 = 132 mg, Intravenous,  Once, 1 of 1 cycle Administration: 132 mg (07/26/2019), 132 mg (08/02/2019), 132 mg (08/09/2019), 132 mg (08/17/2019), 132 mg (08/23/2019), 132 mg (08/30/2019)  for chemotherapy treatment.      CANCER STAGING: Cancer Staging No matching staging information was found for the patient.  INTERVAL HISTORY:  Mr. Steven Crane a 59y.o. male, returns for routine follow-up of his GE junction adenocarcinoma. BD[place]ayewas last seen on 09/06/2019.     He had a PET scan on 10/01/2019 from Dr. LKipp Broodand is scheduled for surgery on 10/18/2019. Today he reports that his appetite has improved and is eating 3 meals a day and drinking multiple Boost cans daily.   REVIEW OF SYSTEMS:  Review of Systems  HENT:   Negative for trouble swallowing.   Gastrointestinal: Positive for constipation.  Neurological: Positive for numbness (feet).  All other systems reviewed and are negative.   PAST MEDICAL/SURGICAL HISTORY:  Past Medical History:  Diagnosis Date  . Anxiety   . Arthritis    "hands, back, knees" (08/27/2013)  . Asthma   . CHF (congestive heart failure) ([facility] 02/2010  . Chronic bronchitis ([facility]    "get it q year"  . Chronic lower back pain   . Closed head injury 1975  . Coronary artery disease May 2015   s/p successful PTCA/DES x 1 to distal RCA and PTCA/DES x 1 to OM-20 Aug 2013  . Depression   . DVT (deep venous thrombosis) ([facility] 2008   "LLE"  . Esophageal cancer ([facility]   . GERD (gastroesophageal reflux disease)   . Headache    hs of but no longer has problems   . History of kidney stones   . Hyperlipemia   . Hypertension   . Hypertriglyceridemia   . LV dysfunction    LVEF 40% at time of cardiac cath May 2015  . Morbid obesity ([facility]   . Myocardial infarction ([facility] 02/2010  . Psoriasis   . Type II diabetes mellitus ([facility]    Past Surgical History:  Procedure Laterality  Date  . BIOPSY  06/17/2019   Procedure: BIOPSY;  Surgeon: Steven Dolin, MD;  Location: [facility];  Service: Endoscopy;;  . COLONOSCOPY  01/06/2012   Dr. Gala Crane: suboptimal prep. normal exam. next colonoscopy in 10 years.   . CORONARY ANGIOPLASTY WITH STENT PLACEMENT  02/2010; 08/27/2013   "1 + 3"   . ESOPHAGOGASTRODUODENOSCOPY (EGD) WITH PROPOFOL N/A 06/17/2019   Procedure: ESOPHAGOGASTRODUODENOSCOPY (EGD) WITH PROPOFOL;  Surgeon: Steven Dolin, MD;  Location: [facility];  Service: Endoscopy;  Laterality: N/A;  11:15am  . IR IMAGING GUIDED PORT  INSERTION  08/03/2019  . KNEE ARTHROSCOPY Left 1981  . LEFT HEART CATHETERIZATION WITH CORONARY ANGIOGRAM N/A 08/27/2013   Procedure: LEFT HEART CATHETERIZATION WITH CORONARY ANGIOGRAM;  Surgeon: Steven Blanks, MD;  Location: [facility] CATH LAB;  Service: Cardiovascular;  Laterality: N/A;  . right wrist fracture surgery     . TIBIA IM NAIL INSERTION Left 08/21/2018   Procedure: INTRAMEDULLARY (IM) NAIL TIBIAL;  Surgeon: Steven Can, MD;  Location: [facility] ORS;  Service: Orthopedics;  Laterality: Left;    SOCIAL HISTORY:  Social History   Socioeconomic History  . Marital status: Single    Spouse name: Not on file  . Number of children: 3  . Years of education: Not on file  . Highest education level: Not on file  Occupational History  . Occupation: [employer]  Tobacco Use  . Smoking status: Never Smoker  . Smokeless tobacco: Current User    Types: Snuff, Chew  . Tobacco comment: "started dipping @ age 60; quit for 5 1/2 yrs at one time; hadn't chewed in awhile"  Vaping Use  . Vaping Use: Never used  Substance and Sexual Activity  . Alcohol use: Yes    Alcohol/week: 0.0 standard drinks    Comment: occ   . Drug use: No  . Sexual activity: Yes  Other Topics Concern  . Not on file  Social History Narrative  . Not on file   Social Determinants of Health   Financial Resource Strain: Low Risk   . Difficulty of Paying Living Expenses: Not hard at all  Food Insecurity: No Food Insecurity  . Worried About Charity fundraiser in the Last Year: Never true  . Ran Out of Food in the Last Year: Never true  Transportation Needs: No Transportation Needs  . Lack of Transportation (Medical): No  . Lack of Transportation (Non-Medical): No  Physical Activity: Insufficiently Active  . Days of Exercise per Week: 1 day  . Minutes of Exercise per Session: 20 min  Stress: No Stress Concern Present  . Feeling of Stress : Not at all  Social Connections: Moderately Integrated  . Frequency of Communication  with Friends and Family: Three times a week  . Frequency of Social Gatherings with Friends and Family: Three times a week  . Attends Religious Services: 1 to 4 times per year  . Active Member of Clubs or Organizations: No  . Attends Archivist Meetings: Never  . Marital Status: Living with partner  Intimate Partner Violence: Not At Risk  . Fear of Current or Ex-Partner: No  . Emotionally Abused: No  . Physically Abused: No  . Sexually Abused: No    FAMILY HISTORY:  Family History  Problem Relation Age of Onset  . Coronary artery disease Father   . Diabetes type II Father   . Diabetes Father   . Heart attack Father   . Hypertension Mother  CURRENT MEDICATIONS:  Current Outpatient Medications  Medication Sig Dispense Refill  . ALPRAZolam (XANAX) 0.5 MG tablet TAKE ONE-HALF TO ONE TABLET BY MOUTH ONCE DAILY AS NEEDED (Patient taking differently: Take 0.25-0.5 mg by mouth daily as needed for anxiety. ) 30 tablet 5  . amLODipine (NORVASC) 5 MG tablet Take 1 tablet (5 mg total) by mouth daily. 90 tablet 1  . aspirin EC 81 MG tablet Take 1 tablet (81 mg total) by mouth daily. (Patient taking differently: Take 81 mg by mouth every evening. ) 90 tablet 3  . atorvastatin (LIPITOR) 40 MG tablet Take 1 tablet by mouth once daily 90 tablet 1  . blood glucose meter kit and supplies Test glucose once daily. ICD 10 E11.9 1 each 5  . calcium carbonate (TUMS - DOSED IN MG ELEMENTAL CALCIUM) 500 MG chewable tablet Chew 2 tablets by mouth 2 (two) times daily as needed for indigestion or heartburn.    . dexlansoprazole (DEXILANT) 60 MG capsule Take 1 capsule (60 mg total) by mouth daily as needed (reflux). (Patient taking differently: Take 60-120 mg by mouth at bedtime as needed (reflux). ) 90 capsule 1  . fenofibrate 160 MG tablet Take 1 tablet (160 mg total) by mouth daily. (Patient taking differently: Take 160 mg by mouth at bedtime. ) 90 tablet 1  . glyBURIDE-metformin (GLUCOVANCE)  5-500 MG tablet Take 2 tablets by mouth 2 (two) times daily. 360 tablet 1  . Lactulose 20 GM/30ML SOLN Take 30 mLs (20 g total) by mouth at bedtime. May increase to twice a day if needed. 450 mL 2  . metoprolol succinate (TOPROL-XL) 50 MG 24 hr tablet TAKE 1 TABLET BY MOUTH ONCE DAILY WITH OR IMMEDIATELY FOLLOWING A MEAL (Patient taking differently: Take 50 mg by mouth daily. ) 90 tablet 1  . pentoxifylline (TRENTAL) 400 MG CR tablet Take 1 tablet (400 mg total) by mouth 3 (three) times daily with meals. 90 tablet 1  . acetaminophen (TYLENOL) 500 MG tablet Take 1,000 mg by mouth in the morning and at bedtime.  (Patient not taking: Reported on 10/04/2019)    . albuterol (PROVENTIL HFA;VENTOLIN HFA) 108 (90 Base) MCG/ACT inhaler Inhale 2 puffs into the lungs every 6 (six) hours as needed for wheezing or shortness of breath. (Patient not taking: Reported on 10/04/2019) 1 Inhaler 2  . lidocaine (XYLOCAINE) 2 % solution Use as directed 15 mLs in the mouth or throat as needed for mouth pain. (Patient not taking: Reported on 10/04/2019) 480 mL 2  . lidocaine-prilocaine (EMLA) cream Apply a small amount to port a cath site and cover with plastic wrap one hour prior to infusion appointments (Patient not taking: Reported on 10/04/2019) 30 g 3  . ondansetron (ZOFRAN ODT) 8 MG disintegrating tablet Take 1 tablet (8 mg total) by mouth every 8 (eight) hours as needed for nausea or vomiting. (Patient not taking: Reported on 10/04/2019) 30 tablet 2  . prochlorperazine (COMPAZINE) 10 MG tablet Take 1 tablet (10 mg total) by mouth every 6 (six) hours as needed for nausea or vomiting. (Patient not taking: Reported on 10/04/2019) 30 tablet 2   No current facility-administered medications for this visit.    ALLERGIES:  Allergies  Allergen Reactions  . Adhesive [Tape] Rash    PHYSICAL EXAM:  Performance status (ECOG): 1 - Symptomatic but completely ambulatory  Vitals:   10/04/19 1009  BP: 135/78  Pulse: 87  Resp: 18    Temp: (!) 97.1 F (36.2 C)  SpO2: 99%  Wt Readings from Last 3 Encounters:  10/04/19 240 lb (108.9 kg)  10/01/19 237 lb 6.4 oz (107.7 kg)  09/21/19 236 lb 3.2 oz (107.1 kg)   Physical Exam Vitals reviewed.  Constitutional:      Appearance: Normal appearance. He is obese.  Cardiovascular:     Rate and Rhythm: Normal rate and regular rhythm.     Pulses: Normal pulses.     Heart sounds: Normal heart sounds.  Pulmonary:     Effort: Pulmonary effort is normal.     Breath sounds: Normal breath sounds.  Abdominal:     Palpations: Abdomen is soft.     Tenderness: There is no abdominal tenderness.  Musculoskeletal:     Right lower leg: Edema (trace) present.     Left lower leg: Edema (trace) present.  Neurological:     General: No focal deficit present.     Mental Status: He is alert and oriented to person, place, and time.  Psychiatric:        Mood and Affect: Mood normal.        Behavior: Behavior normal.      LABORATORY DATA:  I have reviewed the labs as listed.  CBC Latest Ref Rng & Units 10/04/2019 09/29/2019 09/21/2019  WBC 4.0 - 10.5 K/uL 6.6 6.7 4.8  Hemoglobin 13.0 - 17.0 g/dL 11.9(L) 11.9(L) 11.7(L)  Hematocrit 39 - 52 % 35.2(L) 35.8(L) 34.0(L)  Platelets 150 - 400 K/uL 192 184 180   CMP Latest Ref Rng & Units 10/04/2019 09/29/2019 09/21/2019  Glucose 70 - 99 mg/dL 171(H) 194(H) 197(H)  BUN 6 - 20 mg/dL _0 Creatinine 0.61 - 1.24 mg/dL 0.51(L) 0.59(L) 0.61  Sodium 135 - 145 mmol/L 136 134(L) 136  Potassium 3.5 - 5.1 mmol/L 3.6 4.0 3.8  Chloride 98 - 111 mmol/L 101 100 99  CO2 22 - 32 mmol/L _1 Calcium 8.9 - 10.3 mg/dL 8.9 8.9 8.9  Total Protein 6.5 - 8.1 g/dL 6.5 6.4(L) 5.4(L)  Total Bilirubin 0.3 - 1.2 mg/dL 0.5 0.4 0.4  Alkaline Phos 38 - 126 U/L 57 69 62  AST 15 - 41 U/L _2 ALT 0 - 44 U/L _3 DIAGNOSTIC IMAGING:  I have independently reviewed the scans and discussed with the patient. NM PET Image Restag (PS) Skull Base To  Thigh  Result Date: 10/02/2019 CLINICAL DATA:  Subsequent treatment strategy for GE junction adenocarcinoma. EXAM: NUCLEAR MEDICINE PET SKULL BASE TO THIGH TECHNIQUE: 11.7 mCi F-18 FDG was injected intravenously. Full-ring PET imaging was performed from the skull base to thigh after the radiotracer. CT data was obtained and used for attenuation correction and anatomic localization. Fasting blood glucose: 153 mg/dl COMPARISON:  PET-CT dated 07/21/2019 FINDINGS: Mediastinal blood pool activity: SUV max 2.4 Liver activity: SUV max NA NECK: No hypermetabolic cervical lymphadenopathy. Incidental CT findings: none CHEST: Mild residual hypermetabolism involving the distal esophagus, max SUV 4.3, with associated mild wall thickening extending from the distal esophagus (series 4/image 96) to the GE junction (series 4/image 103). No suspicious pulmonary nodules. No hypermetabolic thoracic lymphadenopathy. Incidental CT findings: Right chest port terminates the cavoatrial junction. 3 vessel coronary atherosclerosis. ABDOMEN/PELVIS: No hypermetabolic abdominopelvic lymphadenopathy. Specifically, no gastrohepatic/celiac axis lymphadenopathy. No abnormal hypermetabolism in the liver, spleen, pancreas, or adrenal glands. Incidental CT findings: Layering gallstones (series 4/image 120). Nonobstructing bilateral renal calculi measuring up to 9 mm in the left lower pole (series 4/image 141). Mild atherosclerotic calcifications of  the abdominal aorta. SKELETON: No focal hypermetabolic activity to suggest skeletal metastasis. Incidental CT findings: none IMPRESSION: Mild residual hypermetabolism in the distal esophagus, at the site of known primary gastroesophageal adenocarcinoma, improved. No findings suspicious for metastatic disease. Electronically Signed   By: Julian Hy M.D.   On: 10/02/2019 00:41     ASSESSMENT:  1.  GE junction adenocarcinoma: -PET scan on 07/20/2019 shows uptake in the mass with nonhypermetabolic  gastrohepatic lymph nodes with no evidence of metastatic disease. -Chemoradiation therapy with weekly carboplatin and paclitaxel from 07/26/2019 through 08/30/2019. -PET scan on 10/01/2019 showed mild residual hypermetabolism involving distal esophagus, maximum SUV 4.3 with associated mild wall thickening extending from the distal esophagus to the GE junction.  No metastatic disease.  2.  Weight loss/dysphagia: -He had 30 to 40 pound weight loss. -He is eating better and gaining back.    PLAN:  1.  GE junction adenocarcinoma: -We reviewed images of the PET scan with the patient in detail.  He has gotten excellent response. -He will proceed with Ivor Lewis esophagectomy by Dr. Kipp Crane end of this month.  I will see him back in 4 to 6 weeks after surgery.  2.  Weight loss/dysphagia: -He is eating better.  His appetite has come back.  He is having difficulty eating some foods. -He has gained about 3 pounds.  He is drinking 2 protein drinks per day.  3.  Constipation: -Continue stool softeners and lactulose.  4.  Diabetes: -Continue Glucovance and Levemir.  5.  Hypertension: -Continue to hold Vasotec.    Orders placed this encounter:  No orders of the defined types were placed in this encounter.    Derek Jack, MD [place] [phone]   I, Milinda Antis, am acting as a scribe for Dr. Sanda Linger.  I, Derek Jack MD, have reviewed the above documentation for accuracy and completeness, and I agree with the above.

## 2019-10-07 ENCOUNTER — Ambulatory Visit (HOSPITAL_COMMUNITY): Payer: BC Managed Care – PPO | Attending: Cardiology

## 2019-10-07 ENCOUNTER — Other Ambulatory Visit: Payer: Self-pay

## 2019-10-07 DIAGNOSIS — C159 Malignant neoplasm of esophagus, unspecified: Secondary | ICD-10-CM | POA: Insufficient documentation

## 2019-10-07 DIAGNOSIS — I2510 Atherosclerotic heart disease of native coronary artery without angina pectoris: Secondary | ICD-10-CM | POA: Insufficient documentation

## 2019-10-07 DIAGNOSIS — Z9861 Coronary angioplasty status: Secondary | ICD-10-CM | POA: Diagnosis not present

## 2019-10-07 LAB — MYOCARDIAL PERFUSION IMAGING
LV dias vol: 136 mL (ref 62–150)
LV sys vol: 67 mL
Peak HR: 93 {beats}/min
Rest HR: 76 {beats}/min
SDS: 3
SRS: 2
SSS: 6
TID: 1.08

## 2019-10-07 MED ORDER — TECHNETIUM TC 99M TETROFOSMIN IV KIT
10.6000 | PACK | Freq: Once | INTRAVENOUS | Status: AC | PRN
  Administered 2019-10-07: 10.6 via INTRAVENOUS
  Filled 2019-10-07: qty 11

## 2019-10-07 MED ORDER — TECHNETIUM TC 99M TETROFOSMIN IV KIT
32.9000 | PACK | Freq: Once | INTRAVENOUS | Status: AC | PRN
  Administered 2019-10-07: 32.9 via INTRAVENOUS
  Filled 2019-10-07: qty 33

## 2019-10-07 MED ORDER — REGADENOSON 0.4 MG/5ML IV SOLN
0.4000 mg | Freq: Once | INTRAVENOUS | Status: AC
Start: 2019-10-07 — End: 2019-10-07
  Administered 2019-10-07: 0.4 mg via INTRAVENOUS

## 2019-10-14 ENCOUNTER — Other Ambulatory Visit (HOSPITAL_COMMUNITY)
Admission: RE | Admit: 2019-10-14 | Discharge: 2019-10-14 | Disposition: A | Discharge status: Home / Self Care | Payer: BC Managed Care – PPO | Source: Ambulatory Visit | Attending: Thoracic Surgery (Cardiothoracic Vascular Surgery) | Admitting: Thoracic Surgery (Cardiothoracic Vascular Surgery)

## 2019-10-14 ENCOUNTER — Encounter (HOSPITAL_COMMUNITY)
Admission: RE | Admit: 2019-10-14 | Discharge: 2019-10-14 | Disposition: A | Discharge status: Home / Self Care | Payer: BC Managed Care – PPO | Source: Ambulatory Visit | Attending: Thoracic Surgery (Cardiothoracic Vascular Surgery) | Admitting: Thoracic Surgery (Cardiothoracic Vascular Surgery)

## 2019-10-14 ENCOUNTER — Other Ambulatory Visit: Payer: Self-pay

## 2019-10-14 ENCOUNTER — Encounter (HOSPITAL_COMMUNITY): Payer: Self-pay

## 2019-10-14 ENCOUNTER — Other Ambulatory Visit: Payer: Self-pay | Admitting: Urology

## 2019-10-14 DIAGNOSIS — Z79899 Other long term (current) drug therapy: Secondary | ICD-10-CM | POA: Insufficient documentation

## 2019-10-14 DIAGNOSIS — R9431 Abnormal electrocardiogram [ECG] [EKG]: Secondary | ICD-10-CM | POA: Diagnosis not present

## 2019-10-14 DIAGNOSIS — I110 Hypertensive heart disease with heart failure: Secondary | ICD-10-CM | POA: Insufficient documentation

## 2019-10-14 DIAGNOSIS — C159 Malignant neoplasm of esophagus, unspecified: Secondary | ICD-10-CM | POA: Diagnosis not present

## 2019-10-14 DIAGNOSIS — Z86718 Personal history of other venous thrombosis and embolism: Secondary | ICD-10-CM | POA: Diagnosis not present

## 2019-10-14 DIAGNOSIS — E119 Type 2 diabetes mellitus without complications: Secondary | ICD-10-CM | POA: Diagnosis not present

## 2019-10-14 DIAGNOSIS — Z01818 Encounter for other preprocedural examination: Secondary | ICD-10-CM | POA: Insufficient documentation

## 2019-10-14 DIAGNOSIS — I509 Heart failure, unspecified: Secondary | ICD-10-CM | POA: Insufficient documentation

## 2019-10-14 DIAGNOSIS — I10 Essential (primary) hypertension: Secondary | ICD-10-CM | POA: Diagnosis not present

## 2019-10-14 DIAGNOSIS — Z955 Presence of coronary angioplasty implant and graft: Secondary | ICD-10-CM | POA: Diagnosis not present

## 2019-10-14 DIAGNOSIS — Z20822 Contact with and (suspected) exposure to covid-19: Secondary | ICD-10-CM | POA: Diagnosis not present

## 2019-10-14 DIAGNOSIS — E785 Hyperlipidemia, unspecified: Secondary | ICD-10-CM | POA: Diagnosis not present

## 2019-10-14 DIAGNOSIS — Z87442 Personal history of urinary calculi: Secondary | ICD-10-CM | POA: Insufficient documentation

## 2019-10-14 DIAGNOSIS — I2510 Atherosclerotic heart disease of native coronary artery without angina pectoris: Secondary | ICD-10-CM | POA: Diagnosis not present

## 2019-10-14 DIAGNOSIS — Z7982 Long term (current) use of aspirin: Secondary | ICD-10-CM | POA: Diagnosis not present

## 2019-10-14 DIAGNOSIS — Z794 Long term (current) use of insulin: Secondary | ICD-10-CM | POA: Insufficient documentation

## 2019-10-14 DIAGNOSIS — J45909 Unspecified asthma, uncomplicated: Secondary | ICD-10-CM | POA: Diagnosis not present

## 2019-10-14 LAB — COMPREHENSIVE METABOLIC PANEL
ALT: 23 U/L (ref 0–44)
AST: 21 U/L (ref 15–41)
Albumin: 3.4 g/dL — ABNORMAL LOW (ref 3.5–5.0)
Alkaline Phosphatase: 55 U/L (ref 38–126)
Anion gap: 10 (ref 5–15)
BUN: 9 mg/dL (ref 6–20)
CO2: 22 mmol/L (ref 22–32)
Calcium: 9.1 mg/dL (ref 8.9–10.3)
Chloride: 107 mmol/L (ref 98–111)
Creatinine, Ser: 0.55 mg/dL — ABNORMAL LOW (ref 0.61–1.24)
GFR calc Af Amer: >60 mL/min (ref >60)
GFR calc non Af Amer: >60 mL/min (ref >60)
Glucose, Bld: 147 mg/dL — ABNORMAL HIGH (ref 70–99)
Potassium: 4.1 mmol/L (ref 3.5–5.1)
Sodium: 139 mmol/L (ref 135–145)
Total Bilirubin: 0.5 mg/dL (ref 0.3–1.2)
Total Protein: 6.4 g/dL — ABNORMAL LOW (ref 6.5–8.1)

## 2019-10-14 LAB — CBC
HCT: 36.5 % — ABNORMAL LOW (ref 39.0–52.0)
Hemoglobin: 12.2 g/dL — ABNORMAL LOW (ref 13.0–17.0)
MCH: 30.8 pg (ref 26.0–34.0)
MCHC: 33.4 g/dL (ref 30.0–36.0)
MCV: 92.2 fL (ref 80.0–100.0)
Platelets: 256 10*3/uL (ref 150–400)
RBC: 3.96 MIL/uL — ABNORMAL LOW (ref 4.22–5.81)
RDW: 18.5 % — ABNORMAL HIGH (ref 11.5–15.5)
WBC: 7.1 10*3/uL (ref 4.0–10.5)
nRBC: 0 % (ref 0.0–0.2)

## 2019-10-14 LAB — BLOOD GAS, ARTERIAL
Acid-base deficit: 1.5 mmol/L (ref 0.0–2.0)
Bicarbonate: 22.3 mmol/L (ref 20.0–28.0)
Drawn by: 421801
FIO2: 21
O2 Saturation: 99 %
Patient temperature: 37
pCO2 arterial: 34.8 mmHg (ref 32.0–48.0)
pH, Arterial: 7.422 (ref 7.350–7.450)
pO2, Arterial: 126 mmHg — ABNORMAL HIGH (ref 83.0–108.0)

## 2019-10-14 LAB — URINALYSIS, ROUTINE W REFLEX MICROSCOPIC
Bilirubin Urine: NEGATIVE
Glucose, UA: 150 mg/dL — AB
Hgb urine dipstick: NEGATIVE
Ketones, ur: NEGATIVE mg/dL
Leukocytes,Ua: NEGATIVE
Nitrite: NEGATIVE
Protein, ur: NEGATIVE mg/dL
Specific Gravity, Urine: 1.024 (ref 1.005–1.030)
pH: 5 (ref 5.0–8.0)

## 2019-10-14 LAB — TYPE AND SCREEN
ABO/RH(D): A POS
Antibody Screen: NEGATIVE

## 2019-10-14 LAB — PROTIME-INR
INR: 1.1 (ref 0.8–1.2)
Prothrombin Time: 13.4 seconds (ref 11.4–15.2)

## 2019-10-14 LAB — ABO/RH: ABO/RH(D): A POS

## 2019-10-14 LAB — APTT: aPTT: 36 seconds (ref 24–36)

## 2019-10-14 LAB — SARS CORONAVIRUS 2 (TAT 6-24 HRS): SARS Coronavirus 2: NEGATIVE

## 2019-10-14 LAB — GLUCOSE, CAPILLARY: Glucose-Capillary: 141 mg/dL — ABNORMAL HIGH (ref 70–99)

## 2019-10-14 NOTE — Progress Notes (Addendum)
PCP - Dr. Baltazar Apo Cardiologist - Kate Sable Oncologist - Dr. Derek Jack  PPM/ICD - denies  Chest x-ray - to be done day of surgery EKG - 10/14/2019 Stress Test - 10/07/2019 ECHO - 08/12/13 Cardiac Cath - 2015 per cardiologist's note  Sleep Study - denies CPAP - N/A  Fasting Blood Sugar - 74 - 119 Checks Blood Sugar 1 time/day  Blood Thinner Instructions: N/A Aspirin Instructions: patient instructed to call surgeon's office today to get instructions on when to stop taking aspirin for surgery  ERAS Protcol - No  COVID TEST- 10/14/2019, test results pending. Patient verbalized understanding of self-quarantine instructions.  Anesthesia review: YES, cardiac hx, CAD hx, recent chemo 08/2019, abnormal EKG  Patient denies shortness of breath, fever, cough and chest pain at PAT appointment  All instructions explained to the patient, with a verbal understanding of the material. Patient agrees to go over the instructions while at home for a better understanding. Patient also instructed to self quarantine after being tested for COVID-19. The opportunity to ask questions was provided.

## 2019-10-14 NOTE — Progress Notes (Signed)
Your procedure is scheduled on Monday, June 28th.  Report to [facility] Main Entrance "A" at 05:30 A.M., and check in at the Admitting office.  Call this number if you have problems the morning of surgery: [phone]  Call [phone] if you have any questions prior to your surgery date Monday-Friday 8am-4pm  Remember: Do not eat or drink after midnight the night before your surgery  Take these medicines the morning of surgery with A SIP OF WATER: amLODipine (NORVASC) metoprolol succinate (TOPROL-XL)   IF NEEDED:  acetaminophen (TYLENOL),  ALPRAZolam (XANAX), dexlansoprazole (DEXILANT)  albuterol (PROVENTIL HFA;VENTOLIN HFA)/inhaler - bring with you the day of surgery  Follow your surgeon's instructions on when to stop Aspirin.  If no instructions were given by your surgeon then you will need to call the office to get those instructions.    As of today, STOP taking any Aspirin containing products, pentoxifylline (TRENTAL), Aleve, Naproxen, Ibuprofen, Motrin, Advil, Goody's, BC's, all herbal medications, fish oil, and all vitamins.  WHAT DO I DO ABOUT MY DIABETES MEDICATION?    . THE MORNING OF SURGERY Do NOT take glyBURIDE-metformin (GLUCOVANCE)   Do not take oral diabetes medicines (pills) the morning of surgery.  HOW TO MANAGE YOUR DIABETES BEFORE AND AFTER SURGERY  Why is it important to control my blood sugar before and after surgery? . Improving blood sugar levels before and after surgery helps healing and can limit problems. . A way of improving blood sugar control is eating a healthy diet by: o  Eating less sugar and carbohydrates o  Increasing activity/exercise o  Talking with your doctor about reaching your blood sugar goals . High blood sugars (greater than 180 mg/dL) can raise your risk of infections and slow your recovery, so you will need to focus on controlling your diabetes during the weeks before surgery. . Make sure that the doctor who takes care of your  diabetes knows about your planned surgery including the date and location.  How do I manage my blood sugar before surgery? . Check your blood sugar at least 4 times a day, starting 2 days before surgery, to make sure that the level is not too high or low. . Check your blood sugar the morning of your surgery when you wake up and every 2 hours until you get to the Short Stay unit. o If your blood sugar is less than 70 mg/dL, you will need to treat for low blood sugar: - Do not take insulin. - Treat a low blood sugar (less than 70 mg/dL) with  cup of clear juice (cranberry or apple), 4 glucose tablets, OR glucose gel. - Recheck blood sugar in 15 minutes after treatment (to make sure it is greater than 70 mg/dL). If your blood sugar is not greater than 70 mg/dL on recheck, call [phone] for further instructions. . Report your blood sugar to the short stay nurse when you get to Short Stay.  . If you are admitted to the hospital after surgery: o Your blood sugar will be checked by the staff and you will probably be given insulin after surgery (instead of oral diabetes medicines) to make sure you have good blood sugar levels. o The goal for blood sugar control after surgery is 80-180 mg/d    The Morning of Surgery  Do not wear jewelry.  Do not wear lotions, powders, colognes, or deodorant  Do not shave 48 hours prior to surgery.   Men may shave face and neck.  Do not bring  valuables to the hospital.  [facility] is not responsible for any belongings or valuables.  If you are a smoker, DO NOT Smoke 24 hours prior to surgery  If you wear a CPAP at night please bring your mask the morning of surgery   Remember that you must have someone to transport you home after your surgery, and remain with you for 24 hours if you are discharged the same day.  Please bring cases for contacts, glasses, hearing aids, dentures or bridgework because it cannot be worn into surgery.    Leave your suitcase in  the car.  After surgery it may be brought to your room.  For patients admitted to the hospital, discharge time will be determined by your treatment team.  Patients discharged the day of surgery will not be allowed to drive home.    Special instructions:   [facility]- Preparing For Surgery  Before surgery, you can play an important role. Because skin is not sterile, your skin needs to be as free of germs as possible. You can reduce the number of germs on your skin by washing with CHG (chlorahexidine gluconate) Soap before surgery.  CHG is an antiseptic cleaner which kills germs and bonds with the skin to continue killing germs even after washing.    Oral Hygiene is also important to reduce your risk of infection.  Remember - BRUSH YOUR TEETH THE MORNING OF SURGERY WITH YOUR REGULAR TOOTHPASTE  Please do not use if you have an allergy to CHG or antibacterial soaps. If your skin becomes reddened/irritated stop using the CHG.  Do not shave (including legs and underarms) for at least 48 hours prior to first CHG shower. It is OK to shave your face.  Please follow these instructions carefully.   1. Shower the NIGHT BEFORE SURGERY and the MORNING OF SURGERY with CHG Soap.   2. If you chose to wash your hair and body, wash as usual with your normal shampoo and body-wash/soap.  3. Rinse your hair and body thoroughly to remove the shampoo and soap.  4. Apply CHG directly to the skin (ONLY FROM THE NECK DOWN) and wash gently with a scrungie or a clean washcloth.   5. Do not use on open wounds or open sores. Avoid contact with your eyes, ears, mouth and genitals (private parts). Wash Face and genitals (private parts)  with your normal soap.   6. Wash thoroughly, paying special attention to the area where your surgery will be performed.  7. Thoroughly rinse your body with warm water from the neck down.  8. DO NOT shower/wash with your normal soap after using and rinsing off the CHG  Soap.  9. Pat yourself dry with a CLEAN TOWEL.  10. Wear CLEAN PAJAMAS to bed the night before surgery  11. Place CLEAN SHEETS on your bed the night of your first shower and DO NOT SLEEP WITH PETS.  12. Wear comfortable clothes the morning of surgery.     Day of Surgery:  Please shower the morning of surgery with the CHG soap Do not apply any deodorants/lotions. Please wear clean clothes to the hospital/surgery center.   Remember to brush your teeth WITH YOUR REGULAR TOOTHPASTE.   Please read over the following fact sheets that you were given.

## 2019-10-14 NOTE — Progress Notes (Signed)
Your procedure is scheduled on Monday June 28.  Report to [facility] Main Entrance "A" at 05:30 A.M., and check in at the Admitting office.  Call this number if you have problems the morning of surgery: [phone]  Call [phone] if you have any questions prior to your surgery date Monday-Friday 8am-4pm   Remember: Do not eat or drink after midnight the night before your surgery    Take these medicines the morning of surgery with A SIP OF WATER: amLODipine (NORVASC) atorvastatin (LIPITOR) metoprolol succinate (TOPROL-XL)   IF NEEDED: ALPRAZolam (XANAX)  Follow your surgeon's instructions on when to stop Aspirin.  If no instructions were given by your surgeon then you will need to call the office to get those instructions.     As of today, STOP taking any Aspirin containing products, pentoxifylline (TRENTAL), Aleve, Naproxen, Ibuprofen, Motrin, Advil, Goody's, BC's, all herbal medications, fish oil, and all vitamins.   WHAT DO I DO ABOUT MY DIABETES MEDICATION?   . THE DAY BEFORE SURGERY  o glyBURIDE-metformin (GLUCOVANCE) - take usual dose     . THE MORNING OF SURGERY o glyBURIDE-metformin (GLUCOVANCE) - NONE  Do not take oral diabetes medicines (pills) the morning of surgery.   HOW TO MANAGE YOUR DIABETES BEFORE AND AFTER SURGERY  Why is it important to control my blood sugar before and after surgery? . Improving blood sugar levels before and after surgery helps healing and can limit problems. . A way of improving blood sugar control is eating a healthy diet by: o  Eating less sugar and carbohydrates o  Increasing activity/exercise o  Talking with your doctor about reaching your blood sugar goals . High blood sugars (greater than 180 mg/dL) can raise your risk of infections and slow your recovery, so you will need to focus on controlling your diabetes during the weeks before surgery. . Make sure that the doctor who takes care of your diabetes knows about  your planned surgery including the date and location.  How do I manage my blood sugar before surgery? . Check your blood sugar at least 4 times a day, starting 2 days before surgery, to make sure that the level is not too high or low. . Check your blood sugar the morning of your surgery when you wake up and every 2 hours until you get to the Short Stay unit. o If your blood sugar is less than 70 mg/dL, you will need to treat for low blood sugar: - Do not take insulin. - Treat a low blood sugar (less than 70 mg/dL) with  cup of clear juice (cranberry or apple), 4 glucose tablets, OR glucose gel. - Recheck blood sugar in 15 minutes after treatment (to make sure it is greater than 70 mg/dL). If your blood sugar is not greater than 70 mg/dL on recheck, call [phone] for further instructions. . Report your blood sugar to the short stay nurse when you get to Short Stay.  . If you are admitted to the hospital after surgery: o Your blood sugar will be checked by the staff and you will probably be given insulin after surgery (instead of oral diabetes medicines) to make sure you have good blood sugar levels. o The goal for blood sugar control after surgery is 80-180 mg/dL.     The Morning of Surgery  Do not wear jewelry, make-up or nail polish.  Do not wear lotions, powders, or perfumes/colognes, or deodorant  Do not shave 48 hours prior to surgery.  Men may shave face and neck.  Do not bring valuables to the hospital.  [facility] is not responsible for any belongings or valuables.  If you are a smoker, DO NOT Smoke 24 hours prior to surgery  If you wear a CPAP at night please bring your mask the morning of surgery   Remember that you must have someone to transport you home after your surgery, and remain with you for 24 hours if you are discharged the same day.   Please bring cases for contacts, glasses, hearing aids, dentures or bridgework because it cannot be worn into surgery.     Leave your suitcase in the car.  After surgery it may be brought to your room.  For patients admitted to the hospital, discharge time will be determined by your treatment team.  Patients discharged the day of surgery will not be allowed to drive home.    Special instructions:   [place]- Preparing For Surgery  Before surgery, you can play an important role. Because skin is not sterile, your skin needs to be as free of germs as possible. You can reduce the number of germs on your skin by washing with CHG (chlorahexidine gluconate) Soap before surgery.  CHG is an antiseptic cleaner which kills germs and bonds with the skin to continue killing germs even after washing.    Oral Hygiene is also important to reduce your risk of infection.  Remember - BRUSH YOUR TEETH THE MORNING OF SURGERY WITH YOUR REGULAR TOOTHPASTE  Please do not use if you have an allergy to CHG or antibacterial soaps. If your skin becomes reddened/irritated stop using the CHG.  Do not shave (including legs and underarms) for at least 48 hours prior to first CHG shower. It is OK to shave your face.  Please follow these instructions carefully.   1. Shower the NIGHT BEFORE SURGERY and the MORNING OF SURGERY with CHG Soap.   2. If you chose to wash your hair and body, wash as usual with your normal shampoo and body-wash/soap.  3. Rinse your hair and body thoroughly to remove the shampoo and soap.  4. Apply CHG directly to the skin (ONLY FROM THE NECK DOWN) and wash gently with a scrungie or a clean washcloth.   5. Do not use on open wounds or open sores. Avoid contact with your eyes, ears, mouth and genitals (private parts). Wash Face and genitals (private parts)  with your normal soap.   6. Wash thoroughly, paying special attention to the area where your surgery will be performed.  7. Thoroughly rinse your body with warm water from the neck down.  8. DO NOT shower/wash with your normal soap after using and  rinsing off the CHG Soap.  9. Pat yourself dry with a CLEAN TOWEL.  10. Wear CLEAN PAJAMAS to bed the night before surgery  11. Place CLEAN SHEETS on your bed the night of your first shower and DO NOT SLEEP WITH PETS.  12. Wear comfortable clothes the morning of surgery.     Day of Surgery:  Please shower the morning of surgery with the CHG soap Do not apply any deodorants/lotions. Please wear clean clothes to the hospital/surgery center.   Remember to brush your teeth WITH YOUR REGULAR TOOTHPASTE.   Please read over the following fact sheets that you were given.

## 2019-10-15 ENCOUNTER — Ambulatory Visit: Payer: BC Managed Care – PPO | Admitting: Thoracic Surgery (Cardiothoracic Vascular Surgery)

## 2019-10-15 ENCOUNTER — Ambulatory Visit (HOSPITAL_COMMUNITY)
Admission: RE | Admit: 2019-10-15 | Discharge: 2019-10-15 | Disposition: A | Discharge status: Home / Self Care | Payer: BC Managed Care – PPO | Source: Ambulatory Visit | Attending: Thoracic Surgery (Cardiothoracic Vascular Surgery) | Admitting: Thoracic Surgery (Cardiothoracic Vascular Surgery)

## 2019-10-15 ENCOUNTER — Telehealth (HOSPITAL_COMMUNITY): Payer: Self-pay | Admitting: *Deleted

## 2019-10-15 ENCOUNTER — Telehealth: Payer: Self-pay | Admitting: Family Medicine

## 2019-10-15 DIAGNOSIS — Z9861 Coronary angioplasty status: Secondary | ICD-10-CM

## 2019-10-15 DIAGNOSIS — C159 Malignant neoplasm of esophagus, unspecified: Secondary | ICD-10-CM | POA: Insufficient documentation

## 2019-10-15 DIAGNOSIS — I2510 Atherosclerotic heart disease of native coronary artery without angina pectoris: Secondary | ICD-10-CM | POA: Insufficient documentation

## 2019-10-15 LAB — PULMONARY FUNCTION TEST
DL/VA % pred: 94 %
DL/VA: 3.97 ml/min/mmHg/L
DLCO unc % pred: 84 %
DLCO unc: 26.62 ml/min/mmHg
FEF 25-75 Pre: 4.21 L/sec
FEF2575-%Pred-Pre: 121 %
FEV1-%Pred-Pre: 98 %
FEV1-Pre: 4.14 L
FEV1FVC-%Pred-Pre: 106 %
FEV6-%Pred-Pre: 95 %
FEV6-Pre: 5.07 L
FEV6FVC-%Pred-Pre: 103 %
FVC-%Pred-Pre: 92 %
FVC-Pre: 5.09 L
Pre FEV1/FVC ratio: 81 %
Pre FEV6/FVC Ratio: 100 %
RV % pred: 53 %
RV: 1.31 L
TLC % pred: 81 %
TLC: 6.36 L

## 2019-10-15 NOTE — Progress Notes (Signed)
Anesthesia Chart Review:   Case: [id number] Date/Time: 10/18/19 0715   Procedure: XI ROBOTIC ASSISTED THORASCOPY-IVOR LEWIS ESOPHAGECTOMY (N/A )   Anesthesia type: General   Pre-op diagnosis: ESOPHAGEAL CANCER   Location: [facility] OR ROOM 10 / [facility]   Surgeons: Lajuana Matte, MD      DISCUSSION:  Pt is a 59 year old with hx CAD (DES to RCA and OM1 2015; DES to LAD 2011), CHF, HTN, DM, esophageal cancer, DVT (2008)   VS: BP 119/76   Pulse 86   Temp 37.1 C (Oral)   Resp 18   Ht '6\' 2"'  (1.88 m)   Wt 107.5 kg   SpO2 99%   BMI 30.42 kg/m    PROVIDERS: - PCP is [name], MD   LABS: Labs reviewed: Acceptable for surgery. (all labs ordered are listed, but only abnormal results are displayed)  Labs Reviewed  GLUCOSE, CAPILLARY - Abnormal; Notable for the following components:      Result Value   Glucose-Capillary 141 (*)    All other components within normal limits  BLOOD GAS, ARTERIAL - Abnormal; Notable for the following components:   pO2, Arterial 126 (*)    Allens test (pass/fail) BRACHIAL ARTERY (*)    All other components within normal limits  CBC - Abnormal; Notable for the following components:   RBC 3.96 (*)    Hemoglobin 12.2 (*)    HCT 36.5 (*)    RDW 18.5 (*)    All other components within normal limits  COMPREHENSIVE METABOLIC PANEL - Abnormal; Notable for the following components:   Glucose, Bld 147 (*)    Creatinine, Ser 0.55 (*)    Total Protein 6.4 (*)    Albumin 3.4 (*)    All other components within normal limits  URINALYSIS, ROUTINE W REFLEX MICROSCOPIC - Abnormal; Notable for the following components:   Glucose, UA 150 (*)    All other components within normal limits  APTT  PROTIME-INR  TYPE AND SCREEN  ABO/RH     IMAGES:  CT chest, abd, pelvis with contrast 06/21/19:  1. Circumferential endoluminal mass of the lower esophagus and gastroesophageal junction, with minimal adjacent fat stranding, in keeping with mass identified by  endoscopy. 2. Subtle, prominent gastrohepatic ligament lymph nodes, which measure up to 1.6 x 0.7 cm. These are suspicious for nodal metastatic involvement given enlargement compared to prior examination. 3. No other evidence of metastatic disease in the chest, abdomen, or pelvis. 4. Hepatic steatosis. 5. Nonobstructing nephrolithiasis. 6. Coronary artery disease.  Aortic Atherosclerosis     EKG 10/14/19: NSR. Left axis deviation. Inferior infarct, age undetermined. No significant change from prior   CV:  Nuclear stress test 10/07/19:  1. Fixed inferior/inferolateral defect.  Consistent with prior infarct.  Consider echocardiogram to confirm wall motion abnormality 2. No ischemia 3. Low risk study   Carotid US 11/14/16:  1. Mild diffuse bilateral carotid intimal thickening. No significant stenosis. 2. Vertebrals are patent with antegrade flow   Cardiac cath 08/27/13:  1. Unstable angina 2. Severe stenosis distal RCA (99%), now s/p successful PTCA/DES x 1 distal RCA 3. Severe stenosis first OM (99%) now s/p successful PTCA/DES x 1 OM1 4. Mild to moderate LV systolic dysfunction   Echo 08/12/13:  - Left ventricle: The cavity size was normal. Wall thicknesswas increased in a pattern of moderate LVH. Systolic function was normal. The estimated ejection fraction was in the range of 55% to 60%. There is hypokinesis of the  basal-midinferolateral myocardium. Doppler parameters are consistent with abnormal left ventricular relaxation (grade 1 diastolic dysfunction).  - Aortic valve: Trivial regurgitation.  - Mitral valve: Calcified annulus. Trivial regurgitation.  - Right atrium: Central venous pressure: 32m Hg (est).  - Tricuspid valve: Trivial regurgitation.  - Pulmonary arteries: PA peak pressure: 21mHg (S).  - Pericardium, extracardiac: There was no pericardial effusion.  - Impressions: Normal LV chamber size with moderate LVH and LVEF 55-60%, mid to basal inferolateral hypokinesis,  grade 1 diastolic dysfunction. Normal RV function. PASP 23 mmHg.     Past Medical History:  Diagnosis Date  . Anxiety   . Arthritis    "hands, back, knees" (08/27/2013)  . Asthma   . CHF (congestive heart failure) ([place]11/2011  . Chronic bronchitis ([place]   "get it q year"  . Chronic lower back pain   . Closed head injury 1975  . Coronary artery disease May 2015   s/p successful PTCA/DES x 1 to distal RCA and PTCA/DES x 1 to OM-20 Aug 2013  . Depression   . DVT (deep venous thrombosis) ([place]2008   "LLE"  . Esophageal cancer ([place]CO[place]  . GERD (gastroesophageal reflux disease)   . Headache    hs of but no longer has problems   . History of kidney stones   . Hyperlipemia   . Hypertension   . Hypertriglyceridemia   . LV dysfunction    LVEF 40% at time of cardiac cath May 2015  . Morbid obesity ([place]  . Myocardial infarction ([place]CP[place]11/2011  . Psoriasis   . Type II diabetes mellitus (HCA[place]    Past Surgical History:  Procedure Laterality Date  . BIOPSY  06/17/2019   Procedure: BIOPSY;  Surgeon: RoDaneil DolinMD;  [facility];  Service: Endoscopy;;  . COLONOSCOPY  01/06/2012   Dr. RoGala Romneysuboptimal prep. normal exam. next colonoscopy in 10 years.   . CORONARY ANGIOPLASTY WITH STENT PLACEMENT  02/2010; 08/27/2013   "1 + 3"   . ESOPHAGOGASTRODUODENOSCOPY (EGD) WITH PROPOFOL N/A 06/17/2019   Procedure: ESOPHAGOGASTRODUODENOSCOPY (EGD) WITH PROPOFOL;  Surgeon: RoDaneil DolinMD;  [facility];  Service: Endoscopy;  Laterality: N/A;  11:15am  . IR IMAGING GUIDED PORT INSERTION  08/03/2019  . KNEE ARTHROSCOPY Left 1981  . LEFT HEART CATHETERIZATION WITH CORONARY ANGIOGRAM N/A 08/27/2013   Procedure: LEFT HEART CATHETERIZATION WITH CORONARY ANGIOGRAM;  Surgeon: ChBurnell BlanksMD;  [facility];  Service: Cardiovascular;  Laterality: N/A;  . right wrist fracture surgery     . TIBIA IM NAIL INSERTION Left 08/21/2018   Procedure: INTRAMEDULLARY (IM)  NAIL TIBIAL;  Surgeon: SwRod CanMD;  [facility]: [facility] ORS;  Service: Orthopedics;  Laterality: Left;    MEDICATIONS: . acetaminophen (TYLENOL) 500 MG tablet  . albuterol (PROVENTIL HFA;VENTOLIN HFA) 108 (90 Base) MCG/ACT inhaler  . ALPRAZolam (XANAX) 0.5 MG tablet  . amLODipine (NORVASC) 5 MG tablet  . aspirin EC 81 MG tablet  . atorvastatin (LIPITOR) 40 MG tablet  . blood glucose meter kit and supplies  . calcium carbonate (TUMS - DOSED IN MG ELEMENTAL CALCIUM) 500 MG chewable tablet  . dexlansoprazole (DEXILANT) 60 MG capsule  . fenofibrate 160 MG tablet  . glyBURIDE-metformin (GLUCOVANCE) 5-500 MG tablet  . insulin detemir (LEVEMIR) 100 UNIT/ML injection  . Lactulose 20 GM/30ML SOLN  . lidocaine (XYLOCAINE) 2 % solution  . lidocaine-prilocaine (EMLA) cream  . metoprolol succinate (TOPROL-XL) 50 MG 24 hr  tablet  . Multiple Vitamin (MULTIVITAMIN WITH MINERALS) TABS tablet  . ondansetron (ZOFRAN ODT) 8 MG disintegrating tablet  . pentoxifylline (TRENTAL) 400 MG CR tablet  . prochlorperazine (COMPAZINE) 10 MG tablet   No current facility-administered medications for this encounter.    If no changes, I anticipate pt can proceed with surgery as scheduled.   Willeen Cass, FNP-BC [facility]/Anesthesiology Phone: [phone] 10/15/2019 9:29 AM

## 2019-10-15 NOTE — Telephone Encounter (Signed)
Pt states he had a will drew up and and his lawyer needed a letter saying he was sound mind and pt wanted before surgery because he said he didn't know if he would die during surgery and wanted to make sure his will was done before surgery. Explained to pt that he needs appt with dr Lovena Le because she has never met him and then he asked if dr scott or carolyn would do it because he has seen both of them in the past.   Fax to lawyer - [phone] if able to do. Needs today.

## 2019-10-15 NOTE — Telephone Encounter (Signed)
Nurses This is a situation that requires an office visit and evaluation.(I will talk with you regarding explanation) If Dr. Lovena Le is available to do a phone visit with him potentially she could help him

## 2019-10-15 NOTE — Telephone Encounter (Signed)
Pt called into clinic stating he need documentation stating he was able to make his own decision. Pt was trying to this documentation completed before his surgery Monday. Pt also stated he wanted to put his son's name on his living will. I spoke with Francene Finders, NP and stated pt needs to meet with a legal department that handles these requests. I called pt back to let him know what Stevphen Meuse, NP recommended. I was not able to reach pt and no voicemail was available to leave a message.

## 2019-10-15 NOTE — Telephone Encounter (Signed)
Pt called and wanted to know if a letter can be made per his attorney Sinda Du saying that he is of sound mind. He needs this by the end of the day he is having surgery on Monday in [place].   Pt call back number [phone]

## 2019-10-15 NOTE — Telephone Encounter (Signed)
Pt notified that he would need a visit with dr taylor before any letter could be written. Pt verbalized understanding and states he will call back after surgery to schedule appt

## 2019-10-15 NOTE — Telephone Encounter (Signed)
Hi, Can Dr. Nicki Reaper help with this?  Thx,   Shelly

## 2019-10-15 NOTE — Anesthesia Preprocedure Evaluation (Addendum)
Anesthesia Evaluation  Patient identified by MRN, date of birth, ID band Patient awake    Reviewed: Allergy & Precautions, NPO status , Patient's Chart, lab work & pertinent test results, reviewed documented beta blocker date and time   Airway Mallampati: II  TM Distance: >3 FB Neck ROM: Full    Dental  (+) Dental Advisory Given, Poor Dentition, Missing   Pulmonary asthma ,    Pulmonary exam normal breath sounds clear to auscultation       Cardiovascular hypertension, Pt. on home beta blockers and Pt. on medications + angina + CAD, + Past MI, + Cardiac Stents, +CHF and + DVT  Normal cardiovascular exam Rhythm:Regular Rate:Normal     Neuro/Psych  Headaches, PSYCHIATRIC DISORDERS Anxiety Depression    GI/Hepatic Neg liver ROS, GERD  Medicated,Esophageal cancer    Endo/Other  diabetes, Type 2, Oral Hypoglycemic Agents, Insulin DependentObesity   Renal/GU negative Renal ROS     Musculoskeletal  (+) Arthritis ,   Abdominal   Peds  Hematology negative hematology ROS (+)   Anesthesia Other Findings   Reproductive/Obstetrics                           Anesthesia Physical Anesthesia Plan  ASA: III  Anesthesia Plan: General   Post-op Pain Management:    Induction: Intravenous  PONV Risk Score and Plan: 3 and Midazolam, Dexamethasone and Ondansetron  Airway Management Planned: Double Lumen EBT  Additional Equipment: Arterial line, CVP and Ultrasound Guidance Line Placement  Intra-op Plan:   Post-operative Plan: Possible Post-op intubation/ventilation  Informed Consent: I have reviewed the patients History and Physical, chart, labs and discussed the procedure including the risks, benefits and alternatives for the proposed anesthesia with the patient or authorized representative who has indicated his/her understanding and acceptance.     Dental advisory given  Plan Discussed with:  CRNA  Anesthesia Plan Comments: (See APP note by Durel Salts FNP)     Anesthesia Quick Evaluation

## 2019-10-16 MED ORDER — SODIUM CHLORIDE 0.9 % IV SOLN
2.0000 g | INTRAVENOUS | Status: AC
  Filled 2019-10-16 (×4): qty 2

## 2019-10-17 MED ORDER — SODIUM CHLORIDE 0.9 % IV SOLN
2.0000 g | INTRAVENOUS | Status: AC
  Administered 2019-10-18 (×3): 2 g via INTRAVENOUS
  Filled 2019-10-17 (×2): qty 2

## 2019-10-18 ENCOUNTER — Inpatient Hospital Stay (HOSPITAL_COMMUNITY): Payer: BC Managed Care – PPO | Admitting: Certified Registered"

## 2019-10-18 ENCOUNTER — Other Ambulatory Visit: Payer: Self-pay

## 2019-10-18 ENCOUNTER — Encounter (HOSPITAL_COMMUNITY): Payer: Self-pay | Admitting: Thoracic Surgery (Cardiothoracic Vascular Surgery)

## 2019-10-18 ENCOUNTER — Inpatient Hospital Stay (HOSPITAL_COMMUNITY)
Admission: RE | Admit: 2019-10-18 | Discharge: 2019-10-25 | DRG: 326 | Disposition: A | Discharge status: Home Health | Payer: BC Managed Care – PPO | Attending: Thoracic Surgery (Cardiothoracic Vascular Surgery) | Admitting: Thoracic Surgery (Cardiothoracic Vascular Surgery)

## 2019-10-18 ENCOUNTER — Encounter (HOSPITAL_COMMUNITY)
Admission: RE | Disposition: A | Discharge status: Home Health | Payer: Self-pay | Source: Home / Self Care | Attending: Thoracic Surgery (Cardiothoracic Vascular Surgery)

## 2019-10-18 ENCOUNTER — Inpatient Hospital Stay (HOSPITAL_COMMUNITY): Payer: BC Managed Care – PPO | Admitting: Emergency Medicine

## 2019-10-18 ENCOUNTER — Inpatient Hospital Stay (HOSPITAL_COMMUNITY): Payer: BC Managed Care – PPO

## 2019-10-18 DIAGNOSIS — M19041 Primary osteoarthritis, right hand: Secondary | ICD-10-CM | POA: Diagnosis present

## 2019-10-18 DIAGNOSIS — E119 Type 2 diabetes mellitus without complications: Secondary | ICD-10-CM | POA: Diagnosis present

## 2019-10-18 DIAGNOSIS — I2510 Atherosclerotic heart disease of native coronary artery without angina pectoris: Secondary | ICD-10-CM | POA: Diagnosis present

## 2019-10-18 DIAGNOSIS — M479 Spondylosis, unspecified: Secondary | ICD-10-CM | POA: Diagnosis present

## 2019-10-18 DIAGNOSIS — Z923 Personal history of irradiation: Secondary | ICD-10-CM | POA: Diagnosis not present

## 2019-10-18 DIAGNOSIS — I509 Heart failure, unspecified: Secondary | ICD-10-CM | POA: Diagnosis present

## 2019-10-18 DIAGNOSIS — Z955 Presence of coronary angioplasty implant and graft: Secondary | ICD-10-CM

## 2019-10-18 DIAGNOSIS — Z9109 Other allergy status, other than to drugs and biological substances: Secondary | ICD-10-CM

## 2019-10-18 DIAGNOSIS — J90 Pleural effusion, not elsewhere classified: Secondary | ICD-10-CM

## 2019-10-18 DIAGNOSIS — M170 Bilateral primary osteoarthritis of knee: Secondary | ICD-10-CM | POA: Diagnosis present

## 2019-10-18 DIAGNOSIS — I110 Hypertensive heart disease with heart failure: Secondary | ICD-10-CM | POA: Diagnosis present

## 2019-10-18 DIAGNOSIS — C160 Malignant neoplasm of cardia: Principal | ICD-10-CM | POA: Diagnosis present

## 2019-10-18 DIAGNOSIS — D62 Acute posthemorrhagic anemia: Secondary | ICD-10-CM | POA: Diagnosis not present

## 2019-10-18 DIAGNOSIS — I4891 Unspecified atrial fibrillation: Secondary | ICD-10-CM | POA: Diagnosis not present

## 2019-10-18 DIAGNOSIS — E781 Pure hyperglyceridemia: Secondary | ICD-10-CM | POA: Diagnosis present

## 2019-10-18 DIAGNOSIS — E785 Hyperlipidemia, unspecified: Secondary | ICD-10-CM | POA: Diagnosis present

## 2019-10-18 DIAGNOSIS — I252 Old myocardial infarction: Secondary | ICD-10-CM

## 2019-10-18 DIAGNOSIS — Z86718 Personal history of other venous thrombosis and embolism: Secondary | ICD-10-CM

## 2019-10-18 DIAGNOSIS — K219 Gastro-esophageal reflux disease without esophagitis: Secondary | ICD-10-CM | POA: Diagnosis present

## 2019-10-18 DIAGNOSIS — M19042 Primary osteoarthritis, left hand: Secondary | ICD-10-CM | POA: Diagnosis present

## 2019-10-18 DIAGNOSIS — Z6832 Body mass index (BMI) 32.0-32.9, adult: Secondary | ICD-10-CM | POA: Diagnosis not present

## 2019-10-18 DIAGNOSIS — J45909 Unspecified asthma, uncomplicated: Secondary | ICD-10-CM | POA: Diagnosis present

## 2019-10-18 DIAGNOSIS — Z87442 Personal history of urinary calculi: Secondary | ICD-10-CM | POA: Diagnosis not present

## 2019-10-18 DIAGNOSIS — E43 Unspecified severe protein-calorie malnutrition: Secondary | ICD-10-CM | POA: Diagnosis present

## 2019-10-18 DIAGNOSIS — Z794 Long term (current) use of insulin: Secondary | ICD-10-CM

## 2019-10-18 DIAGNOSIS — Z9221 Personal history of antineoplastic chemotherapy: Secondary | ICD-10-CM

## 2019-10-18 DIAGNOSIS — J939 Pneumothorax, unspecified: Secondary | ICD-10-CM

## 2019-10-18 DIAGNOSIS — C159 Malignant neoplasm of esophagus, unspecified: Secondary | ICD-10-CM | POA: Diagnosis present

## 2019-10-18 DIAGNOSIS — F17290 Nicotine dependence, other tobacco product, uncomplicated: Secondary | ICD-10-CM | POA: Diagnosis present

## 2019-10-18 DIAGNOSIS — Z9889 Other specified postprocedural states: Secondary | ICD-10-CM

## 2019-10-18 HISTORY — PX: INTERCOSTAL NERVE BLOCK: SHX5021

## 2019-10-18 HISTORY — PX: NODE DISSECTION: SHX5269

## 2019-10-18 HISTORY — PX: ESOPHAGOGASTRODUODENOSCOPY: SHX5428

## 2019-10-18 LAB — BASIC METABOLIC PANEL
Anion gap: 13 (ref 5–15)
BUN: 11 mg/dL (ref 6–20)
CO2: 19 mmol/L — ABNORMAL LOW (ref 22–32)
Calcium: 8.8 mg/dL — ABNORMAL LOW (ref 8.9–10.3)
Chloride: 105 mmol/L (ref 98–111)
Creatinine, Ser: 1.1 mg/dL (ref 0.61–1.24)
GFR calc Af Amer: >60 mL/min (ref >60)
GFR calc non Af Amer: >60 mL/min (ref >60)
Glucose, Bld: 313 mg/dL — ABNORMAL HIGH (ref 70–99)
Potassium: 4.2 mmol/L (ref 3.5–5.1)
Sodium: 137 mmol/L (ref 135–145)

## 2019-10-18 LAB — BLOOD GAS, ARTERIAL
Acid-base deficit: 3.9 mmol/L — ABNORMAL HIGH (ref 0.0–2.0)
Bicarbonate: 20.4 mmol/L (ref 20.0–28.0)
FIO2: 21
O2 Saturation: 94 %
Patient temperature: 36.7
pCO2 arterial: 35.1 mmHg (ref 32.0–48.0)
pH, Arterial: 7.381 (ref 7.350–7.450)
pO2, Arterial: 66.4 mmHg — ABNORMAL LOW (ref 83.0–108.0)

## 2019-10-18 LAB — CBC
HCT: 32.7 % — ABNORMAL LOW (ref 39.0–52.0)
Hemoglobin: 10.9 g/dL — ABNORMAL LOW (ref 13.0–17.0)
MCH: 31.3 pg (ref 26.0–34.0)
MCHC: 33.3 g/dL (ref 30.0–36.0)
MCV: 94 fL (ref 80.0–100.0)
Platelets: 186 10*3/uL (ref 150–400)
RBC: 3.48 MIL/uL — ABNORMAL LOW (ref 4.22–5.81)
RDW: 17.8 % — ABNORMAL HIGH (ref 11.5–15.5)
WBC: 10.1 10*3/uL (ref 4.0–10.5)
nRBC: 0 % (ref 0.0–0.2)

## 2019-10-18 LAB — GLUCOSE, CAPILLARY
Glucose-Capillary: 132 mg/dL — ABNORMAL HIGH (ref 70–99)
Glucose-Capillary: 295 mg/dL — ABNORMAL HIGH (ref 70–99)
Glucose-Capillary: 284 mg/dL — ABNORMAL HIGH (ref 70–99)
Glucose-Capillary: 231 mg/dL — ABNORMAL HIGH (ref 70–99)

## 2019-10-18 SURGERY — LOBECTOMY, LUNG, ROBOT-ASSISTED, USING VATS
Anesthesia: General | Site: Abdomen

## 2019-10-18 MED ORDER — CHLORHEXIDINE GLUCONATE 0.12 % MT SOLN
OROMUCOSAL | Status: AC
  Administered 2019-10-18: 15 mL
  Filled 2019-10-18: qty 15

## 2019-10-18 MED ORDER — LACTATED RINGERS IV SOLN: INTRAVENOUS | Status: DC

## 2019-10-18 MED ORDER — METHYLENE BLUE 0.5 % INJ SOLN
INTRAVENOUS | Status: AC
  Filled 2019-10-18: qty 10

## 2019-10-18 MED ORDER — KETOROLAC TROMETHAMINE 30 MG/ML IJ SOLN
30.0000 mg | Freq: Four times a day (QID) | INTRAMUSCULAR | Status: AC
  Administered 2019-10-18 – 2019-10-23 (×20): 30 mg via INTRAVENOUS
  Filled 2019-10-18 (×20): qty 1

## 2019-10-18 MED ORDER — FENTANYL CITRATE (PF) 100 MCG/2ML IJ SOLN
25.0000 ug | INTRAMUSCULAR | Status: DC | PRN
  Administered 2019-10-18: 50 ug via INTRAVENOUS

## 2019-10-18 MED ORDER — CHLORHEXIDINE GLUCONATE 0.12 % MT SOLN
15.0000 mL | OROMUCOSAL | Status: AC
  Administered 2019-10-18: 15 mL via OROMUCOSAL
  Filled 2019-10-18: qty 15

## 2019-10-18 MED ORDER — SODIUM CHLORIDE 0.9 % IV SOLN
2.0000 g | INTRAVENOUS | Status: DC
  Filled 2019-10-18 (×3): qty 2

## 2019-10-18 MED ORDER — ROCURONIUM BROMIDE 10 MG/ML (PF) SYRINGE
PREFILLED_SYRINGE | INTRAVENOUS | Status: DC | PRN
  Administered 2019-10-18 (×2): 20 mg via INTRAVENOUS
  Administered 2019-10-18: 60 mg via INTRAVENOUS

## 2019-10-18 MED ORDER — PROPOFOL 10 MG/ML IV BOLUS
INTRAVENOUS | Status: DC | PRN
  Administered 2019-10-18: 130 mg via INTRAVENOUS

## 2019-10-18 MED ORDER — ACETAMINOPHEN 500 MG PO TABS: 1000.0000 mg | ORAL_TABLET | Freq: Once | ORAL | Status: DC

## 2019-10-18 MED ORDER — INDOCYANINE GREEN 25 MG IV SOLR
INTRAVENOUS | Status: DC | PRN
Start: 2019-10-18 — End: 2019-10-18
  Administered 2019-10-18: 15 mg via INTRAVENOUS

## 2019-10-18 MED ORDER — INSULIN ASPART 100 UNIT/ML ~~LOC~~ SOLN
SUBCUTANEOUS | Status: AC
  Filled 2019-10-18: qty 1

## 2019-10-18 MED ORDER — ORAL CARE MOUTH RINSE: 15.0000 mL | Freq: Once | OROMUCOSAL | Status: AC

## 2019-10-18 MED ORDER — FENTANYL CITRATE (PF) 250 MCG/5ML IJ SOLN
INTRAMUSCULAR | Status: AC
  Filled 2019-10-18: qty 5

## 2019-10-18 MED ORDER — FUROSEMIDE 10 MG/ML IJ SOLN
INTRAMUSCULAR | Status: DC | PRN
  Administered 2019-10-18 (×2): 10 mg via INTRAMUSCULAR

## 2019-10-18 MED ORDER — CHLORHEXIDINE GLUCONATE CLOTH 2 % EX PADS
6.0000 | MEDICATED_PAD | Freq: Every day | CUTANEOUS | Status: DC
  Administered 2019-10-18 – 2019-10-24 (×7): 6 via TOPICAL

## 2019-10-18 MED ORDER — PROMETHAZINE HCL 25 MG/ML IJ SOLN: 6.2500 mg | INTRAMUSCULAR | Status: DC | PRN

## 2019-10-18 MED ORDER — LACTATED RINGERS IV SOLN: INTRAVENOUS | Status: DC | PRN

## 2019-10-18 MED ORDER — INSULIN ASPART 100 UNIT/ML ~~LOC~~ SOLN
0.0000 [IU] | SUBCUTANEOUS | Status: DC
  Administered 2019-10-18: 12 [IU] via SUBCUTANEOUS
  Administered 2019-10-18: 8 [IU] via SUBCUTANEOUS
  Administered 2019-10-19 (×3): 4 [IU] via SUBCUTANEOUS
  Administered 2019-10-19: 2 [IU] via SUBCUTANEOUS
  Administered 2019-10-20: 4 [IU] via SUBCUTANEOUS
  Administered 2019-10-20: 12 [IU] via SUBCUTANEOUS
  Administered 2019-10-20 (×2): 8 [IU] via SUBCUTANEOUS
  Administered 2019-10-21: 2 [IU] via SUBCUTANEOUS
  Administered 2019-10-21 (×2): 4 [IU] via SUBCUTANEOUS
  Administered 2019-10-21: 8 [IU] via SUBCUTANEOUS
  Administered 2019-10-21 (×2): 4 [IU] via SUBCUTANEOUS
  Administered 2019-10-22: 8 [IU] via SUBCUTANEOUS
  Administered 2019-10-22: 2 [IU] via SUBCUTANEOUS
  Administered 2019-10-22: 4 [IU] via SUBCUTANEOUS
  Administered 2019-10-22 (×2): 8 [IU] via SUBCUTANEOUS
  Administered 2019-10-22: 2 [IU] via SUBCUTANEOUS
  Administered 2019-10-23: 4 [IU] via SUBCUTANEOUS
  Administered 2019-10-23: 2 [IU] via SUBCUTANEOUS
  Administered 2019-10-23: 4 [IU] via SUBCUTANEOUS
  Administered 2019-10-23: 2 [IU] via SUBCUTANEOUS
  Administered 2019-10-23: 4 [IU] via SUBCUTANEOUS
  Administered 2019-10-23 – 2019-10-24 (×2): 8 [IU] via SUBCUTANEOUS
  Administered 2019-10-24: 12 [IU] via SUBCUTANEOUS
  Administered 2019-10-24: 2 [IU] via SUBCUTANEOUS
  Administered 2019-10-24: 8 [IU] via SUBCUTANEOUS
  Administered 2019-10-24: 4 [IU] via SUBCUTANEOUS
  Administered 2019-10-24: 8 [IU] via SUBCUTANEOUS
  Administered 2019-10-25: 4 [IU] via SUBCUTANEOUS
  Administered 2019-10-25 (×2): 8 [IU] via SUBCUTANEOUS

## 2019-10-18 MED ORDER — FENTANYL CITRATE (PF) 100 MCG/2ML IJ SOLN
INTRAMUSCULAR | Status: DC | PRN
  Administered 2019-10-18: 50 ug via INTRAVENOUS
  Administered 2019-10-18: 25 ug via INTRAVENOUS
  Administered 2019-10-18: 100 ug via INTRAVENOUS
  Administered 2019-10-18: 50 ug via INTRAVENOUS
  Administered 2019-10-18: 25 ug via INTRAVENOUS
  Administered 2019-10-18: 50 ug via INTRAVENOUS

## 2019-10-18 MED ORDER — BUPIVACAINE HCL (PF) 0.5 % IJ SOLN
INTRAMUSCULAR | Status: AC
  Filled 2019-10-18: qty 30

## 2019-10-18 MED ORDER — LIDOCAINE 2% (20 MG/ML) 5 ML SYRINGE
INTRAMUSCULAR | Status: DC | PRN
  Administered 2019-10-18: 60 mg via INTRAVENOUS

## 2019-10-18 MED ORDER — VECURONIUM BROMIDE 10 MG IV SOLR
INTRAVENOUS | Status: AC
  Filled 2019-10-18: qty 10

## 2019-10-18 MED ORDER — MORPHINE SULFATE (PF) 2 MG/ML IV SOLN
1.0000 mg | INTRAVENOUS | Status: DC | PRN
  Administered 2019-10-18: 2 mg via INTRAVENOUS
  Filled 2019-10-18 (×2): qty 1

## 2019-10-18 MED ORDER — FUROSEMIDE 10 MG/ML IJ SOLN
INTRAMUSCULAR | Status: AC
  Filled 2019-10-18: qty 4

## 2019-10-18 MED ORDER — SODIUM CHLORIDE FLUSH 0.9 % IV SOLN
INTRAVENOUS | Status: DC | PRN
  Administered 2019-10-18: 100 mL

## 2019-10-18 MED ORDER — KETAMINE HCL 10 MG/ML IJ SOLN
INTRAMUSCULAR | Status: DC | PRN
  Administered 2019-10-18: 30 mg via INTRAVENOUS
  Administered 2019-10-18 (×7): 10 mg via INTRAVENOUS

## 2019-10-18 MED ORDER — ALBUMIN HUMAN 5 % IV SOLN: INTRAVENOUS | Status: DC | PRN

## 2019-10-18 MED ORDER — ORAL CARE MOUTH RINSE: 15.0000 mL | Freq: Once | OROMUCOSAL | Status: DC

## 2019-10-18 MED ORDER — INSULIN ASPART 100 UNIT/ML ~~LOC~~ SOLN
SUBCUTANEOUS | Status: DC | PRN
Start: 2019-10-18 — End: 2019-10-18
  Administered 2019-10-18: 5 [IU] via SUBCUTANEOUS

## 2019-10-18 MED ORDER — DEXTROSE 50 % IV SOLN
INTRAVENOUS | Status: DC | PRN
  Administered 2019-10-18: 25 g via INTRAVENOUS

## 2019-10-18 MED ORDER — HEMOSTATIC AGENTS (NO CHARGE) OPTIME
TOPICAL | Status: DC | PRN
Start: 2019-10-18 — End: 2019-10-18
  Administered 2019-10-18: 1 via TOPICAL

## 2019-10-18 MED ORDER — ONDANSETRON HCL 4 MG/2ML IJ SOLN: 4.0000 mg | INTRAMUSCULAR | Status: DC | PRN

## 2019-10-18 MED ORDER — CHLORHEXIDINE GLUCONATE 0.12 % MT SOLN: 15.0000 mL | Freq: Once | OROMUCOSAL | Status: DC

## 2019-10-18 MED ORDER — PHENYLEPHRINE 40 MCG/ML (10ML) SYRINGE FOR IV PUSH (FOR BLOOD PRESSURE SUPPORT)
PREFILLED_SYRINGE | INTRAVENOUS | Status: AC
  Filled 2019-10-18: qty 10

## 2019-10-18 MED ORDER — SODIUM CHLORIDE 0.9 % IV SOLN
INTRAVENOUS | Status: DC | PRN
Start: 2019-10-18 — End: 2019-10-18

## 2019-10-18 MED ORDER — DEXAMETHASONE SODIUM PHOSPHATE 10 MG/ML IJ SOLN
INTRAMUSCULAR | Status: DC | PRN
Start: 2019-10-18 — End: 2019-10-18
  Administered 2019-10-18: 10 mg via INTRAVENOUS

## 2019-10-18 MED ORDER — 0.9 % SODIUM CHLORIDE (POUR BTL) OPTIME
TOPICAL | Status: DC | PRN
  Administered 2019-10-18: 3000 mL

## 2019-10-18 MED ORDER — ROCURONIUM BROMIDE 10 MG/ML (PF) SYRINGE
PREFILLED_SYRINGE | INTRAVENOUS | Status: AC
  Filled 2019-10-18: qty 10

## 2019-10-18 MED ORDER — LACTATED RINGERS IV SOLN
INTRAVENOUS | Status: DC | PRN
Start: 2019-10-18 — End: 2019-10-18

## 2019-10-18 MED ORDER — ALBUTEROL SULFATE (2.5 MG/3ML) 0.083% IN NEBU
2.5000 mg | INHALATION_SOLUTION | Freq: Four times a day (QID) | RESPIRATORY_TRACT | Status: DC
  Administered 2019-10-18 – 2019-10-19 (×3): 2.5 mg via RESPIRATORY_TRACT
  Filled 2019-10-18 (×3): qty 3

## 2019-10-18 MED ORDER — EPHEDRINE 5 MG/ML INJ
INTRAVENOUS | Status: AC
  Filled 2019-10-18: qty 10

## 2019-10-18 MED ORDER — SUGAMMADEX SODIUM 200 MG/2ML IV SOLN
INTRAVENOUS | Status: DC | PRN
  Administered 2019-10-18: 200 mg via INTRAVENOUS

## 2019-10-18 MED ORDER — ACETAMINOPHEN 500 MG PO TABS
ORAL_TABLET | ORAL | Status: AC
  Filled 2019-10-18: qty 2

## 2019-10-18 MED ORDER — LIDOCAINE 2% (20 MG/ML) 5 ML SYRINGE
INTRAMUSCULAR | Status: AC
  Filled 2019-10-18: qty 5

## 2019-10-18 MED ORDER — ACETAMINOPHEN 160 MG/5ML PO SOLN
650.0000 mg | Freq: Four times a day (QID) | ORAL | Status: DC | PRN
  Administered 2019-10-19: 650 mg
  Filled 2019-10-18: qty 20.3

## 2019-10-18 MED ORDER — ONDANSETRON HCL 4 MG/2ML IJ SOLN
INTRAMUSCULAR | Status: AC
  Filled 2019-10-18: qty 2

## 2019-10-18 MED ORDER — CALCIUM CHLORIDE 10 % IV SOLN
INTRAVENOUS | Status: DC | PRN
Start: 2019-10-18 — End: 2019-10-18
  Administered 2019-10-18 (×5): 100 mg via INTRAVENOUS

## 2019-10-18 MED ORDER — INDOCYANINE GREEN 25 MG IV SOLR
INTRAVENOUS | Status: AC
  Filled 2019-10-18: qty 10

## 2019-10-18 MED ORDER — CHLORHEXIDINE GLUCONATE 0.12 % MT SOLN: 15.0000 mL | Freq: Once | OROMUCOSAL | Status: AC

## 2019-10-18 MED ORDER — SODIUM CHLORIDE 0.9 % IV SOLN
2.0000 g | Freq: Four times a day (QID) | INTRAVENOUS | Status: AC
  Administered 2019-10-18 – 2019-10-19 (×3): 2 g via INTRAVENOUS
  Filled 2019-10-18 (×3): qty 2

## 2019-10-18 MED ORDER — FENTANYL CITRATE (PF) 100 MCG/2ML IJ SOLN
INTRAMUSCULAR | Status: AC
  Filled 2019-10-18: qty 2

## 2019-10-18 MED ORDER — VECURONIUM BROMIDE 10 MG IV SOLR
INTRAVENOUS | Status: DC | PRN
Start: 2019-10-18 — End: 2019-10-18
  Administered 2019-10-18 (×8): 2 mg via INTRAVENOUS

## 2019-10-18 MED ORDER — PROPOFOL 10 MG/ML IV BOLUS
INTRAVENOUS | Status: AC
  Filled 2019-10-18: qty 20

## 2019-10-18 MED ORDER — PANTOPRAZOLE SODIUM 40 MG IV SOLR
40.0000 mg | Freq: Two times a day (BID) | INTRAVENOUS | Status: DC
  Administered 2019-10-18 – 2019-10-25 (×14): 40 mg via INTRAVENOUS
  Filled 2019-10-18 (×15): qty 40

## 2019-10-18 MED ORDER — MIDAZOLAM HCL 5 MG/5ML IJ SOLN
INTRAMUSCULAR | Status: DC | PRN
  Administered 2019-10-18: 2 mg via INTRAVENOUS

## 2019-10-18 MED ORDER — MIDAZOLAM HCL 2 MG/2ML IJ SOLN
INTRAMUSCULAR | Status: AC
  Filled 2019-10-18: qty 2

## 2019-10-18 MED ORDER — KETAMINE HCL 50 MG/5ML IJ SOSY
PREFILLED_SYRINGE | INTRAMUSCULAR | Status: AC
  Filled 2019-10-18: qty 10

## 2019-10-18 MED ORDER — ONDANSETRON HCL 4 MG/2ML IJ SOLN
INTRAMUSCULAR | Status: DC | PRN
  Administered 2019-10-18: 4 mg via INTRAVENOUS

## 2019-10-18 SURGICAL SUPPLY — 175 items
ADH SKN CLS APL DERMABOND .7 (GAUZE/BANDAGES/DRESSINGS) ×8
APL PRP STRL LF DISP 70% ISPRP (MISCELLANEOUS) ×12
BAG SPEC RTRVL LRG 6X4 10 (ENDOMECHANICALS)
BLADE CLIPPER SURG (BLADE) ×6 IMPLANT
BLADE SURG 11 STRL SS (BLADE) ×12 IMPLANT
BNDG COHESIVE 6X5 TAN STRL LF (GAUZE/BANDAGES/DRESSINGS) ×6 IMPLANT
CANISTER SUCT 3000ML PPV (MISCELLANEOUS) ×24 IMPLANT
CANNULA REDUC XI 12-8 STAPL (CANNULA) ×15
CANNULA REDUC XI 12-8MM STAPL (CANNULA) ×3
CANNULA REDUCER 12-8 DVNC XI (CANNULA) ×12 IMPLANT
CATH THORACIC 28FR (CATHETERS) IMPLANT
CATH THORACIC 28FR RT ANG (CATHETERS) IMPLANT
CATH THORACIC 36FR (CATHETERS) IMPLANT
CATH THORACIC 36FR RT ANG (CATHETERS) IMPLANT
CATH TROCAR 20FR (CATHETERS) IMPLANT
CHLORAPREP W/TINT 26 (MISCELLANEOUS) ×18 IMPLANT
CLIP VESOCCLUDE MED 6/CT (CLIP) IMPLANT
CNTNR URN SCR LID CUP LEK RST (MISCELLANEOUS) ×26 IMPLANT
CONN ST 1/4X3/8  BEN (MISCELLANEOUS) ×12
CONN ST 1/4X3/8 BEN (MISCELLANEOUS) ×6 IMPLANT
CONN Y 3/8X3/8X3/8  BEN (MISCELLANEOUS)
CONN Y 3/8X3/8X3/8 BEN (MISCELLANEOUS) IMPLANT
CONT SPEC 4OZ STRL OR WHT (MISCELLANEOUS) ×42
COVER SURGICAL LIGHT HANDLE (MISCELLANEOUS) IMPLANT
COVER TIP SHEARS 8 DVNC (MISCELLANEOUS) ×6 IMPLANT
COVER TIP SHEARS 8MM DA VINCI (MISCELLANEOUS) ×18
DEFOGGER SCOPE WARMER CLEARIFY (MISCELLANEOUS) ×18 IMPLANT
DERMABOND ADVANCED (GAUZE/BANDAGES/DRESSINGS) ×4
DERMABOND ADVANCED .7 DNX12 (GAUZE/BANDAGES/DRESSINGS) ×11 IMPLANT
DEVICE SUTURE ENDOST 10MM (ENDOMECHANICALS) ×5 IMPLANT
DISSECTOR BLUNT TIP ENDO 5MM (MISCELLANEOUS) IMPLANT
DRAIN CHANNEL 28F RND 3/8 FF (WOUND CARE) ×4 IMPLANT
DRAIN CHANNEL 32F RND 10.7 FF (WOUND CARE) IMPLANT
DRAIN PENROSE 1/4X12 LTX STRL (WOUND CARE) ×6 IMPLANT
DRAPE ARM DVNC X/XI (DISPOSABLE) ×32 IMPLANT
DRAPE COLUMN DVNC XI (DISPOSABLE) ×8 IMPLANT
DRAPE CV SPLIT W-CLR ANES SCRN (DRAPES) ×18 IMPLANT
DRAPE DA VINCI XI ARM (DISPOSABLE) ×48
DRAPE DA VINCI XI COLUMN (DISPOSABLE) ×12
DRAPE HALF SHEET 40X57 (DRAPES) ×12 IMPLANT
DRAPE INCISE IOBAN 66X45 STRL (DRAPES) ×18 IMPLANT
DRAPE ORTHO SPLIT 77X108 STRL (DRAPES) ×18
DRAPE SLUSH/WARMER DISC (DRAPES) IMPLANT
DRAPE SURG ORHT 6 SPLT 77X108 (DRAPES) ×12 IMPLANT
DRAPE WARM FLUID 44X44 (DRAPES) ×6 IMPLANT
ELECT BLADE 6.5 EXT (BLADE) ×3 IMPLANT
ELECT REM PT RETURN 9FT ADLT (ELECTROSURGICAL) ×12
ELECTRODE REM PT RTRN 9FT ADLT (ELECTROSURGICAL) ×8 IMPLANT
FELT TEFLON 1X6 (MISCELLANEOUS) IMPLANT
GAUZE KITTNER 4X10 (MISCELLANEOUS) ×12 IMPLANT
GAUZE KITTNER 4X5 RF (MISCELLANEOUS) ×6 IMPLANT
GAUZE KITTNER 4X8 (MISCELLANEOUS) IMPLANT
GAUZE SPONGE 4X4 12PLY STRL (GAUZE/BANDAGES/DRESSINGS) ×9 IMPLANT
GLOVE BIO SURGEON STRL SZ 6.5 (GLOVE) ×40 IMPLANT
GLOVE BIO SURGEON STRL SZ7.5 (GLOVE) ×42 IMPLANT
GLOVE BIO SURGEONS STRL SZ 6.5 (GLOVE) ×8
GLOVE BIOGEL PI IND STRL 6.5 (GLOVE) ×4 IMPLANT
GLOVE BIOGEL PI INDICATOR 6.5 (GLOVE) ×4
GLOVE SURG SS PI 7.5 STRL IVOR (GLOVE) ×4 IMPLANT
GOWN STRL REUS W/ TWL LRG LVL3 (GOWN DISPOSABLE) ×16 IMPLANT
GOWN STRL REUS W/ TWL XL LVL3 (GOWN DISPOSABLE) ×20 IMPLANT
GOWN STRL REUS W/TWL 2XL LVL3 (GOWN DISPOSABLE) ×21 IMPLANT
GOWN STRL REUS W/TWL LRG LVL3 (GOWN DISPOSABLE) ×42
GOWN STRL REUS W/TWL XL LVL3 (GOWN DISPOSABLE) ×30
GRASPER ENDOPATH ANVIL 10MM (MISCELLANEOUS) ×3 IMPLANT
GRASPER SUT TROCAR 14GX15 (MISCELLANEOUS) ×11 IMPLANT
HEMOSTAT SURGICEL 2X14 (HEMOSTASIS) ×9 IMPLANT
IRRIGATION STRYKERFLOW (MISCELLANEOUS) ×4 IMPLANT
IRRIGATOR STRYKERFLOW (MISCELLANEOUS) ×6
IRRIGATOR SUCT 8 DISP DVNC XI (IRRIGATION / IRRIGATOR) ×1 IMPLANT
IRRIGATOR SUCTION 8MM XI DISP (IRRIGATION / IRRIGATOR) ×6
IV NS 1000ML (IV SOLUTION)
IV NS 1000ML BAXH (IV SOLUTION) IMPLANT
KIT BASIN OR (CUSTOM PROCEDURE TRAY) ×12 IMPLANT
KIT DILATOR VASC 18G NDL (KITS) ×3 IMPLANT
KIT SUCTION CATH 14FR (SUCTIONS) IMPLANT
KIT TUBE JEJUNAL 16FR (CATHETERS) ×6 IMPLANT
KIT TURNOVER KIT B (KITS) ×12 IMPLANT
LOOP VESSEL SUPERMAXI WHITE (MISCELLANEOUS) IMPLANT
NDL 18GX1X1/2 (RX/OR ONLY) (NEEDLE) IMPLANT
NEEDLE 18GX1X1/2 (RX/OR ONLY) (NEEDLE) IMPLANT
NEEDLE 22X1 1/2 (OR ONLY) (NEEDLE) ×6 IMPLANT
NS IRRIG 1000ML POUR BTL (IV SOLUTION) ×30 IMPLANT
PACK CHEST (CUSTOM PROCEDURE TRAY) ×12 IMPLANT
PAD ARMBOARD 7.5X6 YLW CONV (MISCELLANEOUS) ×42 IMPLANT
POUCH ENDO CATCH II 15MM (MISCELLANEOUS) IMPLANT
POUCH SPECIMEN RETRIEVAL 10MM (ENDOMECHANICALS) IMPLANT
RELOAD ENDO STITCH (ENDOMECHANICALS) ×30 IMPLANT
RELOAD STAPLE 45 2.5 WHT DVNC (STAPLE) IMPLANT
RELOAD STAPLE 45 4.3 GRN DVNC (STAPLE) IMPLANT
RELOAD STAPLE 60 4.3 GRN DVNC (STAPLE) IMPLANT
RELOAD STAPLER 2.5X45 WHT DVNC (STAPLE) ×8 IMPLANT
RELOAD STAPLER 3.5X45 BLU DVNC (STAPLE) ×24 IMPLANT
RELOAD STAPLER 4.3X45 GRN DVNC (STAPLE) ×28 IMPLANT
RELOAD STAPLER 4.3X60 GRN DVNC (STAPLE) ×12 IMPLANT
RETRACTOR WOUND ALXS 19CM XSML (INSTRUMENTS) ×2 IMPLANT
RTRCTR WOUND ALEXIS 19CM XSML (INSTRUMENTS) ×6
SCISSORS LAP 5X35 DISP (ENDOMECHANICALS) ×9 IMPLANT
SEAL CANN UNIV 5-8 DVNC XI (MISCELLANEOUS) ×20 IMPLANT
SEAL XI 5MM-8MM UNIVERSAL (MISCELLANEOUS) ×30
SEALANT PROGEL (MISCELLANEOUS) IMPLANT
SEALANT SURG COSEAL 4ML (VASCULAR PRODUCTS) IMPLANT
SEALANT SURG COSEAL 8ML (VASCULAR PRODUCTS) IMPLANT
SEALER LIGASURE MARYLAND 30 (ELECTROSURGICAL) ×4 IMPLANT
SEALER SYNCHRO 8 IS4000 DV (MISCELLANEOUS) ×6
SEALER SYNCHRO 8 IS4000 DVNC (MISCELLANEOUS) ×4 IMPLANT
SET IRRIG TUBING LAPAROSCOPIC (IRRIGATION / IRRIGATOR) IMPLANT
SET TUBE SMOKE EVAC HIGH FLOW (TUBING) ×12 IMPLANT
SHEET MEDIUM DRAPE 40X70 STRL (DRAPES) ×6 IMPLANT
SOLUTION ELECTROLUBE (MISCELLANEOUS) IMPLANT
SPONGE INTESTINAL PEANUT (DISPOSABLE) IMPLANT
SPONGE LAP 18X18 RF (DISPOSABLE) ×4 IMPLANT
SPONGE TONSIL TAPE 1 RFD (DISPOSABLE) IMPLANT
STAPLER 45 DA VINCI SURE FORM (STAPLE) ×12
STAPLER 45 SUREFORM DVNC (STAPLE) ×2 IMPLANT
STAPLER 60 DA VINCI SURE FORM (STAPLE)
STAPLER 60 SUREFORM DVNC (STAPLE) IMPLANT
STAPLER CANNULA SEAL DVNC XI (STAPLE) ×12 IMPLANT
STAPLER CANNULA SEAL XI (STAPLE) ×18
STAPLER CIRC 25MM 4.8MM THK (STAPLE) ×2 IMPLANT
STAPLER RELOAD 2.5X45 WHITE (STAPLE) ×12
STAPLER RELOAD 2.5X45 WHT DVNC (STAPLE) ×8
STAPLER RELOAD 3.5X45 BLU DVNC (STAPLE) ×24
STAPLER RELOAD 3.5X45 BLUE (STAPLE) ×36
STAPLER RELOAD 4.3X45 GREEN (STAPLE) ×42
STAPLER RELOAD 4.3X45 GRN DVNC (STAPLE) ×28
STAPLER RELOAD 4.3X60 GREEN (STAPLE) ×18
STAPLER RELOAD 4.3X60 GRN DVNC (STAPLE) ×12
STAPLER TRANS-ORAL 25MM EEA (STAPLE) IMPLANT
STOPCOCK 4 WAY LG BORE MALE ST (IV SETS) ×12 IMPLANT
SUT ETHIBOND 0 36 GRN (SUTURE) ×8 IMPLANT
SUT MNCRL AB 3-0 PS2 18 (SUTURE) IMPLANT
SUT MON AB 2-0 CT1 36 (SUTURE) IMPLANT
SUT PDS AB 1 CTX 36 (SUTURE) IMPLANT
SUT PROLENE 4 0 RB 1 (SUTURE)
SUT PROLENE 4-0 RB1 .5 CRCL 36 (SUTURE) IMPLANT
SUT SILK  1 MH (SUTURE) ×18
SUT SILK 1 MH (SUTURE) ×10 IMPLANT
SUT SILK 1 TIES 10X30 (SUTURE) IMPLANT
SUT SILK 2 0 SH (SUTURE) ×5 IMPLANT
SUT SILK 2 0SH CR/8 30 (SUTURE) IMPLANT
SUT SURGIDAC NAB ES-9 0 48 120 (SUTURE) ×30 IMPLANT
SUT VIC AB 1 CTX 36 (SUTURE)
SUT VIC AB 1 CTX36XBRD ANBCTR (SUTURE) IMPLANT
SUT VIC AB 2-0 CT1 27 (SUTURE) ×6
SUT VIC AB 2-0 CT1 36 (SUTURE) ×6 IMPLANT
SUT VIC AB 2-0 CT1 TAPERPNT 27 (SUTURE) ×4 IMPLANT
SUT VIC AB 2-0 SH 27 (SUTURE) ×12
SUT VIC AB 2-0 SH 27XBRD (SUTURE) ×4 IMPLANT
SUT VIC AB 3-0 SH 27 (SUTURE) ×60
SUT VIC AB 3-0 SH 27X BRD (SUTURE) ×40 IMPLANT
SUT VIC AB 3-0 SH 8-18 (SUTURE) ×10 IMPLANT
SUT VIC AB 3-0 X1 27 (SUTURE) ×4 IMPLANT
SUT VICRYL 0 TIES 12 18 (SUTURE) ×6 IMPLANT
SUT VICRYL 0 UR6 27IN ABS (SUTURE) ×42 IMPLANT
SUT VICRYL 2 TP 1 (SUTURE) IMPLANT
SUT VLOC 180 0 9IN  GS21 (SUTURE) ×6
SUT VLOC 180 0 9IN GS21 (SUTURE) ×4 IMPLANT
SYR 10ML LL (SYRINGE) ×12 IMPLANT
SYR 20ML LL LF (SYRINGE) ×10 IMPLANT
SYR 50ML LL SCALE MARK (SYRINGE) ×12 IMPLANT
SYSTEM SAHARA CHEST DRAIN ATS (WOUND CARE) ×12 IMPLANT
TAPE CLOTH 4X10 WHT NS (GAUZE/BANDAGES/DRESSINGS) ×6 IMPLANT
TAPE CLOTH SURG 4X10 WHT LF (GAUZE/BANDAGES/DRESSINGS) ×4 IMPLANT
TIP APPLICATOR SPRAY EXTEND 16 (VASCULAR PRODUCTS) IMPLANT
TOWEL GREEN STERILE (TOWEL DISPOSABLE) ×12 IMPLANT
TOWEL GREEN STERILE FF (TOWEL DISPOSABLE) ×6 IMPLANT
TRAY FOLEY MTR SLVR 16FR STAT (SET/KITS/TRAYS/PACK) ×12 IMPLANT
TROCAR XCEL 12X100 BLDLESS (ENDOMECHANICALS) ×12 IMPLANT
TROCAR XCEL BLADELESS 5X75MML (TROCAR) ×6 IMPLANT
TROCAR XCEL NON-BLD 5MMX100MML (ENDOMECHANICALS) ×6 IMPLANT
TUBE J 18FR (TUBING) IMPLANT
TUBING EXTENTION W/L.L. (IV SETS) ×12 IMPLANT
TUBING LAP HI FLOW INSUFFLATIO (TUBING) ×6 IMPLANT
WATER STERILE IRR 1000ML POUR (IV SOLUTION) ×12 IMPLANT

## 2019-10-18 NOTE — H&P (Signed)
[address]       [place] [address]             [phone]                    Jas R Yeaman Arthur Medical Record #[id number] Date of Birth: 19-Aug-1960  Referring: No ref. provider found Primary Care: Erven Colla, DO Primary Cardiologist: Kate Sable, MD  Chief Complaint:   No chief complaint on file.   History of Present Illness:    Steven Crane 59 y.o. male with esophageal cancer presents for surgery following his neoadjuvant therapy. No significant changes since his last clinic appointment.      Past Medical History:  Diagnosis Date  . Anxiety   . Arthritis    "hands, back, knees" (08/27/2013)  . Asthma   . CHF (congestive heart failure) ([place]) 02/2010  . Chronic bronchitis ([place])    "get it q year"  . Chronic lower back pain   . Closed head injury 1975  . Coronary artery disease May 2015   s/p successful PTCA/DES x 1 to distal RCA and PTCA/DES x 1 to OM-20 Aug 2013  . Depression   . DVT (deep venous thrombosis) ([place]) 2008   "LLE"  . Esophageal cancer ([place])   . GERD (gastroesophageal reflux disease)   . Headache    hs of but no longer has problems   . History of kidney stones   . Hyperlipemia   . Hypertension   . Hypertriglyceridemia   . LV dysfunction    LVEF 40% at time of cardiac cath May 2015  . Morbid obesity ([place])   . Myocardial infarction ([place]) 02/2010  . Psoriasis   . Type II diabetes mellitus ([place])     Past Surgical History:  Procedure Laterality Date  . BIOPSY  06/17/2019   Procedure: BIOPSY;  Surgeon: [name], MD;  [facility];  Service: Endoscopy;;  . COLONOSCOPY  01/06/2012   Dr. [name]: suboptimal prep. normal exam. next colonoscopy in 10 years.   . CORONARY ANGIOPLASTY WITH STENT PLACEMENT  02/2010; 08/27/2013   "1 + 3"   . ESOPHAGOGASTRODUODENOSCOPY (EGD) WITH PROPOFOL N/A 06/17/2019   Procedure: ESOPHAGOGASTRODUODENOSCOPY (EGD) WITH PROPOFOL;  Surgeon: [name], MD;  [facility];  Service: Endoscopy;  Laterality: N/A;  11:15am  . IR IMAGING GUIDED PORT INSERTION  08/03/2019  . KNEE ARTHROSCOPY Left 1981  . LEFT HEART CATHETERIZATION WITH CORONARY ANGIOGRAM N/A 08/27/2013   Procedure: LEFT HEART CATHETERIZATION WITH CORONARY ANGIOGRAM;  Surgeon: [name], MD;  [facility] C[facility] LAB;  Service: Cardiovascular;  Laterality: N/A;  . right wrist fracture surgery     . TIBIA IM NAIL INSERTION Left 08/21/2018   Procedure: INTRAMEDULLARY (IM) NAIL TIBIAL;  Surgeon: Rod Can, MD;  [facility]S;  Service: Orthopedics;  Laterality: Left;    Family History  Problem Relation Age of Onset  . Coronary artery disease Father   . Diabetes type II Father   . Diabetes Father   . Heart attack Father   . Hypertension Mother      Social History   Tobacco Use  Smoking Status Never Smoker  Smokeless Tobacco Current User  . Types: Snuff, Chew  Tobacco Comment   "started dipping @ age 50; quit for 5 1/2 yrs at one time; hadn't chewed in awhile"    Social History   Substance and Sexual  Activity  Alcohol Use Not Currently  . Alcohol/week: 0.0 standard drinks   Comment: occ      Allergies  Allergen Reactions  . Adhesive [Tape] Rash    Current Facility-Administered Medications  Medication Dose Route Frequency Provider Last Rate Last Admin  . acetaminophen (TYLENOL) tablet 1,000 mg  1,000 mg Oral Once Catalina Gravel, MD      . cefOXitin (MEFOXIN) 2 g in sodium chloride 0.9 % 100 mL IVPB  2 g Intravenous On Call to Clint, MD      . lactated ringers infusion   Intravenous Continuous Catalina Gravel, MD       Facility-Administered Medications Ordered in Other Encounters  Medication Dose Route Frequency Provider Last Rate Last Admin  . lactated ringers infusion   Intravenous Continuous PRN Griffin Dakin, CRNA   New Bag at 10/18/19 0700  . lactated ringers infusion   Intravenous Continuous PRN Griffin Dakin,  CRNA   New Bag at 10/18/19 0645    Review of Systems  Constitutional: Negative.   HENT: Positive for sore throat.   Respiratory: Negative.   Cardiovascular: Negative.   Gastrointestinal: Positive for abdominal pain and heartburn.     PHYSICAL EXAMINATION: BP 130/74   Pulse 75   Temp 97.9 F (36.6 C) (Temporal)   Resp 18   Ht 6\' 2"  (1.88 m)   Wt 107.5 kg   SpO2 98%   BMI 30.42 kg/m  Physical Exam Constitutional:      Appearance: Normal appearance.  Eyes:     Conjunctiva/sclera: Conjunctivae normal.  Cardiovascular:     Rate and Rhythm: Normal rate.  Pulmonary:     Effort: Pulmonary effort is normal. No respiratory distress.  Musculoskeletal:        General: Normal range of motion.     Cervical back: Normal range of motion.  Neurological:     General: No focal deficit present.     Mental Status: He is alert and oriented to person, place, and time.         I have independently reviewed the above radiology studies  and reviewed the findings with the patient.   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  He has completed his neoadjuvant therapy, and despite loosing 70lbs, he has been able to regain some weight, and maintain a stable albumin.  The risks and benefits of robotic assisted Ivor-Lewis esophagectomy, with J tube has been discussed.  He is agreeable  to proceed.        Lajuana Matte 10/18/2019 7:33 AM

## 2019-10-18 NOTE — Anesthesia Procedure Notes (Signed)
Procedure Name: Intubation Date/Time: 10/18/2019 7:53 AM Performed by: Griffin Dakin, CRNA Pre-anesthesia Checklist: Patient identified, Emergency Drugs available, Suction available, Patient being monitored and Timeout performed Patient Re-evaluated:Patient Re-evaluated prior to induction Oxygen Delivery Method: Circle system utilized Preoxygenation: Pre-oxygenation with 100% oxygen Induction Type: IV induction Ventilation: Mask ventilation without difficulty and Oral airway inserted - appropriate to patient size Laryngoscope Size: Miller and 3 Grade View: Grade I Tube type: Oral Endobronchial tube: Double lumen EBT and 41 Fr Number of attempts: 1 Airway Equipment and Method: Stylet and Fiberoptic brochoscope Placement Confirmation: ETT inserted through vocal cords under direct vision,  positive ETCO2,  CO2 detector and breath sounds checked- equal and bilateral Tube secured with: Tape

## 2019-10-18 NOTE — Op Note (Signed)
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 Servando Snare, MD - assisting   Anesthesia  general EBL:  100 ml Blood Administration: non Specimen:  Proximal and distal margins.  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  He has completed his neoadjuvant therapy, and despite loosing 70lbs, he has been able to regain some weight, and maintain a stable albumin.  The risks and benefits of robotic assisted Ivor-Lewis esophagectomy, with J tube has been discussed.  He is agreeable to proceed  Findings: Esophageal lesion noted at 35cm on EGD.  No mass noted on retroflexion in the gastric cardia.  Stomach was viable after ligation of the short gastrics, and left gastric arteries.  Good intra-thoracic length.  Two full donuts.    Operative Technique: After the risks, benefits and alternatives were thoroughly discussed, the patient was brought to the operative theatre.  Anesthesia was induced, and the esophagoscope was passed through the oropharynx down to the stomach.  The scope was retroflexed.  On retrograde examination of the esophagus, there was good response to neoadjuvant therapy.  The scope was then parked at 25 cm from the incisors.  The patient was then prepped and draped in normal sterile fashion.  An appropriate surgical pause was  performed, and pre-operative antibiotics were dosed accordingly.  We began with a 1 cm incision 15 cm caudad from the xiphoid and slightly lateral to the umbilicus.  Using an Optiview we entered the peritoneal space.  The abdomen was then insufflated with CO2.  3 other robotic ports were placed to triangulate the hiatus.  Another 12 mm port was placed in place at the level of the umbilicus laterally for an assistant port and another 5 mm trocar was placed in the right lower quadrant for liver retractor.  The patient was then placed in steep reverse Trendelenburg and the liver was elevated to expose the esophageal hiatus.  And then the robot was docked.  We began by dividing the gastrohepatic ligament to expose the right diaphragmatic crus and then dissected the esophagus into the mediastinum.  We then divided the short gastrics and moved towards the right crus and completed our dissection along the esophageal hiatus.  A Penrose drain was then used to encircle the the esophagus and we continued our dissection up into the mediastinum.  The stomach was then retracted superiorly, and we mobilized it off of the pancreas.  The left gastric artery was then isolated and divided with a robotic stabler.  ICG was then injected through his central line, and good blood flow to the stomach was evident.  We then marked an area away from the right gastroepiploic artery, and began to divide the omentum.  Once we achieved good mobilization, we focused our attention on the pylorus.  A 3cm longitudinal incision in the serosa was made, and the muscle was carefully divided.  We then began to tubularized the gastric conduit with several fires of the robotic stapler.  Once complete, the conduit was then attached to the distal end of the specimen.  We then undocked the robot, after removing all instruments, and the liver retractor, and then focused our attention placement of the jejunostomy tube.  The ligament of Treitz was  identified, and a portion of small bowel about 20-30cm distal was used.  Corner stiches were placed, and passed out through the abdominal wall.  Using Seldinger technique, we access the small bowel and confirmed its position with insufflation.  The tract was sequentially dilated, and we passed a 64F jejunostomy tube distally.  The sutures were secured, and another proximal stitch was placed to prevent torsion.  The pneumoperitoneum was released, and all ports were removed.  The incisions were closed with absorbable suture.  The esophagoscope was removed.  The patient was then placed in a left lateral decubitus position.  The robotic ports were placed to triangulate the esophagus.  We continued our mobilization of the esophagus up to the azygous vein.  The azygous vein was divided with a robotic stapler.  The esophagus was then mobilized circumferentially, and then divided at the level of the azygous vein.  A 4cm access incision was made inferiorly in the posterior axillary line.  A wound retractor was placed, and we pulled the specimen up along with the gastric conduit.  Proximal and distal margins from the specimen were sent off.  An gastrotomy was then made in the conduit, and 75mm EEA stapler was inserted.  The anvil was passed on an NG tube, and it was passed through a small esophagotomy.  The two were connected, and the anastomosis was created.  Two complete rings were produced, and gastrotomy was closed with several fires of the stapler.  The suture line was buttressed with an omental pedical flap.  The ports were removed, and a 22F chest tube was placed.  An intercostal nerve block was performed.  The lung was expanded.  The incisions were closed with absorbable suture.    The patient tolerated the procedure without any immediate complications, and was transferred to the PACU in stable condition.  Leaman Abe Bary Leriche

## 2019-10-18 NOTE — Anesthesia Postprocedure Evaluation (Signed)
Anesthesia Post Note  Patient: Steven Crane  Procedure(s) Performed: XI ROBOTIC ASSISTED THORASCOPY-IVOR LEWIS ESOPHAGECTOMY (N/A ) ESOPHAGOGASTRODUODENOSCOPY (EGD) (N/A ) INTERCOSTAL NERVE BLOCK NODE DISSECTION (Abdomen) XI ROBOTIC ASSISTED JEJUNOSTOMY TUBE PLACEMENT (Left Abdomen)     Patient location during evaluation: PACU Anesthesia Type: General Level of consciousness: awake and alert Pain management: pain level controlled Vital Signs Assessment: post-procedure vital signs reviewed and stable Respiratory status: spontaneous breathing, nonlabored ventilation, respiratory function stable and patient connected to nasal cannula oxygen Cardiovascular status: blood pressure returned to baseline and stable Postop Assessment: no apparent nausea or vomiting Anesthetic complications: no   No complications documented.  Last Vitals:  Vitals:   10/18/19 1930 10/18/19 2000  BP: (!) 143/78   Pulse: 86   Resp: 15   Temp: 36.9 C   SpO2: 97% 98%    Last Pain:  Vitals:   10/18/19 1955  TempSrc:   PainSc: 7                  Catalina Gravel

## 2019-10-18 NOTE — Transfer of Care (Signed)
Immediate Anesthesia Transfer of Care Note  Patient: Steven Crane  Procedure(s) Performed: XI ROBOTIC ASSISTED THORASCOPY-IVOR LEWIS ESOPHAGECTOMY (N/A ) ESOPHAGOGASTRODUODENOSCOPY (EGD) (N/A ) INTERCOSTAL NERVE BLOCK NODE DISSECTION (Abdomen) XI ROBOTIC ASSISTED JEJUNOSTOMY TUBE PLACEMENT (Left Abdomen)  Patient Location: PACU  Anesthesia Type:General  Level of Consciousness: awake, alert  and oriented  Airway & Oxygen Therapy: Patient Spontanous Breathing  Post-op Assessment: Report given to RN and Post -op Vital signs reviewed and stable  Post vital signs: Reviewed and stable  Last Vitals:  Vitals Value Taken Time  BP 126/72 10/18/19 1712  Temp    Pulse 81 10/18/19 1719  Resp 20 10/18/19 1719  SpO2 97 % 10/18/19 1719  Vitals shown include unvalidated device data.  Last Pain:  Vitals:   10/18/19 0641  TempSrc:   PainSc: 0-No pain      Patients Stated Pain Goal: 4 (02/19/58 4585)  Complications: No complications documented.

## 2019-10-18 NOTE — Anesthesia Procedure Notes (Signed)
Arterial Line Insertion Start/End6/28/2021 7:00 AM, 10/18/2019 7:05 AM Performed by: Griffin Dakin, CRNA, CRNA  Patient location: Pre-op. Preanesthetic checklist: patient identified, IV checked, risks and benefits discussed, surgical consent, monitors and equipment checked, pre-op evaluation and timeout performed Lidocaine 1% used for infiltration Right, radial was placed Catheter size: 20 G Hand hygiene performed , maximum sterile barriers used  and Seldinger technique used Allen's test indicative of satisfactory collateral circulation Attempts: 1 Procedure performed without using ultrasound guided technique. Following insertion, Biopatch. Post procedure assessment: normal and unchanged  Patient tolerated the procedure well with no immediate complications.

## 2019-10-19 ENCOUNTER — Inpatient Hospital Stay (HOSPITAL_COMMUNITY): Payer: BC Managed Care – PPO

## 2019-10-19 ENCOUNTER — Telehealth: Payer: Self-pay | Admitting: Family Medicine

## 2019-10-19 ENCOUNTER — Other Ambulatory Visit: Payer: Self-pay | Admitting: *Deleted

## 2019-10-19 ENCOUNTER — Encounter (HOSPITAL_COMMUNITY): Payer: Self-pay | Admitting: Thoracic Surgery (Cardiothoracic Vascular Surgery)

## 2019-10-19 LAB — POCT I-STAT 7, (LYTES, BLD GAS, ICA,H+H)
Acid-base deficit: 4 mmol/L — ABNORMAL HIGH (ref 0.0–2.0)
Acid-base deficit: 5 mmol/L — ABNORMAL HIGH (ref 0.0–2.0)
Acid-base deficit: 4 mmol/L — ABNORMAL HIGH (ref 0.0–2.0)
Acid-base deficit: 5 mmol/L — ABNORMAL HIGH (ref 0.0–2.0)
Acid-base deficit: 1 mmol/L (ref 0.0–2.0)
Bicarbonate: 25 mmol/L (ref 20.0–28.0)
Bicarbonate: 22.3 mmol/L (ref 20.0–28.0)
Bicarbonate: 22.6 mmol/L (ref 20.0–28.0)
Bicarbonate: 22.8 mmol/L (ref 20.0–28.0)
Bicarbonate: 24.4 mmol/L (ref 20.0–28.0)
Calcium, Ion: 1.23 mmol/L (ref 1.15–1.40)
Calcium, Ion: 1.18 mmol/L (ref 1.15–1.40)
Calcium, Ion: 1.2 mmol/L (ref 1.15–1.40)
Calcium, Ion: 1.28 mmol/L (ref 1.15–1.40)
Calcium, Ion: 1.21 mmol/L (ref 1.15–1.40)
HCT: 33 % — ABNORMAL LOW (ref 39.0–52.0)
HCT: 32 % — ABNORMAL LOW (ref 39.0–52.0)
HCT: 31 % — ABNORMAL LOW (ref 39.0–52.0)
HCT: 31 % — ABNORMAL LOW (ref 39.0–52.0)
HCT: 30 % — ABNORMAL LOW (ref 39.0–52.0)
Hemoglobin: 11.2 g/dL — ABNORMAL LOW (ref 13.0–17.0)
Hemoglobin: 10.9 g/dL — ABNORMAL LOW (ref 13.0–17.0)
Hemoglobin: 10.5 g/dL — ABNORMAL LOW (ref 13.0–17.0)
Hemoglobin: 10.5 g/dL — ABNORMAL LOW (ref 13.0–17.0)
Hemoglobin: 10.2 g/dL — ABNORMAL LOW (ref 13.0–17.0)
O2 Saturation: 97 %
O2 Saturation: 99 %
O2 Saturation: 94 %
O2 Saturation: 97 %
O2 Saturation: 100 %
Patient temperature: 37.1
Potassium: 5.1 mmol/L (ref 3.5–5.1)
Potassium: 5.5 mmol/L — ABNORMAL HIGH (ref 3.5–5.1)
Potassium: 5.8 mmol/L — ABNORMAL HIGH (ref 3.5–5.1)
Potassium: 5.1 mmol/L (ref 3.5–5.1)
Potassium: 4.8 mmol/L (ref 3.5–5.1)
Sodium: 137 mmol/L (ref 135–145)
Sodium: 136 mmol/L (ref 135–145)
Sodium: 136 mmol/L (ref 135–145)
Sodium: 136 mmol/L (ref 135–145)
Sodium: 137 mmol/L (ref 135–145)
TCO2: 27 mmol/L (ref 22–32)
TCO2: 24 mmol/L (ref 22–32)
TCO2: 24 mmol/L (ref 22–32)
TCO2: 24 mmol/L (ref 22–32)
TCO2: 26 mmol/L (ref 22–32)
pCO2 arterial: 66.5 mmHg (ref 32.0–48.0)
pCO2 arterial: 48.2 mmHg — ABNORMAL HIGH (ref 32.0–48.0)
pCO2 arterial: 49.3 mmHg — ABNORMAL HIGH (ref 32.0–48.0)
pCO2 arterial: 52 mmHg — ABNORMAL HIGH (ref 32.0–48.0)
pCO2 arterial: 41.3 mmHg (ref 32.0–48.0)
pH, Arterial: 7.183 — CL (ref 7.350–7.450)
pH, Arterial: 7.273 — ABNORMAL LOW (ref 7.350–7.450)
pH, Arterial: 7.269 — ABNORMAL LOW (ref 7.350–7.450)
pH, Arterial: 7.25 — ABNORMAL LOW (ref 7.350–7.450)
pH, Arterial: 7.38 (ref 7.350–7.450)
pO2, Arterial: 119 mmHg — ABNORMAL HIGH (ref 83.0–108.0)
pO2, Arterial: 137 mmHg — ABNORMAL HIGH (ref 83.0–108.0)
pO2, Arterial: 80 mmHg — ABNORMAL LOW (ref 83.0–108.0)
pO2, Arterial: 101 mmHg (ref 83.0–108.0)
pO2, Arterial: 351 mmHg — ABNORMAL HIGH (ref 83.0–108.0)

## 2019-10-19 LAB — MAGNESIUM
Magnesium: 2.2 mg/dL (ref 1.7–2.4)
Magnesium: 2.2 mg/dL (ref 1.7–2.4)

## 2019-10-19 LAB — GLUCOSE, CAPILLARY
Glucose-Capillary: 125 mg/dL — ABNORMAL HIGH (ref 70–99)
Glucose-Capillary: 169 mg/dL — ABNORMAL HIGH (ref 70–99)
Glucose-Capillary: 184 mg/dL — ABNORMAL HIGH (ref 70–99)
Glucose-Capillary: 195 mg/dL — ABNORMAL HIGH (ref 70–99)
Glucose-Capillary: 86 mg/dL (ref 70–99)
Glucose-Capillary: 90 mg/dL (ref 70–99)
Glucose-Capillary: 98 mg/dL (ref 70–99)

## 2019-10-19 LAB — BLOOD GAS, ARTERIAL
Acid-Base Excess: 0.3 mmol/L (ref 0.0–2.0)
Bicarbonate: 23.7 mmol/L (ref 20.0–28.0)
FIO2: 21
O2 Saturation: 98.4 %
Patient temperature: 37
pCO2 arterial: 33.7 mmHg (ref 32.0–48.0)
pH, Arterial: 7.462 — ABNORMAL HIGH (ref 7.350–7.450)
pO2, Arterial: 94.5 mmHg (ref 83.0–108.0)

## 2019-10-19 LAB — BASIC METABOLIC PANEL
Anion gap: 9 (ref 5–15)
BUN: 12 mg/dL (ref 6–20)
CO2: 23 mmol/L (ref 22–32)
Calcium: 8.9 mg/dL (ref 8.9–10.3)
Chloride: 107 mmol/L (ref 98–111)
Creatinine, Ser: 0.85 mg/dL (ref 0.61–1.24)
GFR calc Af Amer: >60 mL/min (ref >60)
GFR calc non Af Amer: >60 mL/min (ref >60)
Glucose, Bld: 134 mg/dL — ABNORMAL HIGH (ref 70–99)
Potassium: 4 mmol/L (ref 3.5–5.1)
Sodium: 139 mmol/L (ref 135–145)

## 2019-10-19 LAB — CBC
HCT: 31 % — ABNORMAL LOW (ref 39.0–52.0)
Hemoglobin: 10.4 g/dL — ABNORMAL LOW (ref 13.0–17.0)
MCH: 30.9 pg (ref 26.0–34.0)
MCHC: 33.5 g/dL (ref 30.0–36.0)
MCV: 92 fL (ref 80.0–100.0)
Platelets: 188 10*3/uL (ref 150–400)
RBC: 3.37 MIL/uL — ABNORMAL LOW (ref 4.22–5.81)
RDW: 17.9 % — ABNORMAL HIGH (ref 11.5–15.5)
WBC: 11.4 10*3/uL — ABNORMAL HIGH (ref 4.0–10.5)
nRBC: 0 % (ref 0.0–0.2)

## 2019-10-19 LAB — PHOSPHORUS
Phosphorus: 4 mg/dL (ref 2.5–4.6)
Phosphorus: 4.2 mg/dL (ref 2.5–4.6)

## 2019-10-19 MED ORDER — CHLORHEXIDINE GLUCONATE 0.12 % MT SOLN
15.0000 mL | Freq: Two times a day (BID) | OROMUCOSAL | Status: DC
  Administered 2019-10-20 – 2019-10-24 (×8): 15 mL via OROMUCOSAL
  Filled 2019-10-19 (×9): qty 15

## 2019-10-19 MED ORDER — OSMOLITE 1.5 CAL PO LIQD
1000.0000 mL | ORAL | Status: DC
  Administered 2019-10-19 – 2019-10-21 (×3): 1000 mL
  Filled 2019-10-19 (×6): qty 1000

## 2019-10-19 MED ORDER — PRO-STAT SUGAR FREE PO LIQD
30.0000 mL | Freq: Three times a day (TID) | ORAL | Status: DC
  Administered 2019-10-19 – 2019-10-21 (×8): 30 mL
  Filled 2019-10-19 (×9): qty 30

## 2019-10-19 MED ORDER — ORAL CARE MOUTH RINSE
15.0000 mL | Freq: Two times a day (BID) | OROMUCOSAL | Status: DC
  Administered 2019-10-19 – 2019-10-24 (×7): 15 mL via OROMUCOSAL

## 2019-10-19 MED ORDER — GLYBURIDE-METFORMIN 5-500 MG PO TABS: 2.0000 | ORAL_TABLET | Freq: Two times a day (BID) | ORAL | 0 refills | Status: DC

## 2019-10-19 NOTE — Progress Notes (Addendum)
      [address]       [place] [address]             [phone]       1 Day Post-Op Procedure(s) (LRB): XI ROBOTIC ASSISTED THORASCOPY-IVOR LEWIS ESOPHAGECTOMY (N/A) ESOPHAGOGASTRODUODENOSCOPY (EGD) (N/A) INTERCOSTAL NERVE BLOCK NODE DISSECTION XI ROBOTIC ASSISTED JEJUNOSTOMY TUBE PLACEMENT (Left)  Subjective: Patient denies nausea and states incisional/chest tube pain not "too bad".  Objective: Vital signs in last 24 hours: Temp:  [97.9 F (36.6 C)-98.7 F (37.1 C)] 98.6 F (37 C) (06/29 0427) Pulse Rate:  [79-88] 82 (06/29 0427) Cardiac Rhythm: Normal sinus rhythm (06/29 0427) Resp:  [13-20] 18 (06/29 0427) BP: (124-147)/(69-83) 124/69 (06/29 0427) SpO2:  [95 %-100 %] 96 % (06/29 0427) Arterial Line BP: (113-153)/(55-76) 113/76 (06/28 1800)     Intake/Output from previous day: 06/28 0701 - 06/29 0700 In: 5405.4 [I.V.:3500; NG/GT:90; IV Piggyback:1475.4] Out: 3055 [Urine:2555; Blood:200; Chest Tube:300]   Physical Exam:  Cardiovascular: RRR, no murmurs, gallops, or rubs. Pulmonary: Slightly diminished bibasilar breath sounds Abdomen: Soft, non tender, rare bowel sounds present. Extremities: SCDs in place Wounds: Clean and dry.  No erythema or signs of infection. Chest Tube: to suction, no air leak  Lab Results: CBC: Recent Labs    10/18/19 1800 10/19/19 0327  WBC 10.1 11.4*  HGB 10.9* 10.4*  HCT 32.7* 31.0*  PLT 186 188   BMET:  Recent Labs    10/18/19 1800 10/19/19 0327  NA 137 139  K 4.2 4.0  CL 105 107  CO2 19* 23  GLUCOSE 313* 134*  BUN 11 12  CREATININE 1.10 0.85  CALCIUM 8.8* 8.9    PT/INR: No results for input(s): LABPROT, INR in the last 72 hours. ABG:  INR: Will add last result for INR, ABG once components are confirmed Will add last 4 CBG results once components are confirmed  Assessment/Plan:  1. CV - SR with HR in the 80's 2.  Pulmonary - On room air.Blood gas results noted.  Chest tube with 300 cc  since surgery and is sero sanguinous. Chest tube is to suction and there is no air leak. CXR this am not ordered so will order. Encourage incentive spirometer 3. Anemia-H and H this am 10.4 and 31. 4. GI-NPO, IVF. TFs to start soon. NGT with 90 cc since surgery. Swallow study in a few days. 5. CBGs 231/184/125. Will continue as will be NPO, has a history of diabetes, and will be starting TFs soon.  Sascha Palma M ZimmermanPA-C 10/19/2019,7:16 AM [phone]

## 2019-10-19 NOTE — Telephone Encounter (Signed)
Pls schedule a video visit. So we can discuss his meds.  Tomorrow or Thursday. Thx.   Dr. Lovena Le

## 2019-10-19 NOTE — Telephone Encounter (Signed)
Pt needs to see what discharge summary says with discharge from hospital if they still want him on this medication.    Thx.   Dr. Lovena Le

## 2019-10-19 NOTE — Discharge Summary (Signed)
Physician Discharge Summary       [address]       [place] [address]             [phone]    Patient ID: Steven Crane MRN: [id number] DOB/AGE: 1960/07/05 59 y.o.  Admit date: 10/18/2019 Discharge date: 10/25/2019  Admission Diagnoses:  Esophageal cancer [facility])  Discharge Diagnoses:  1. S/p Esophagoscopy - Robotic assisted laparoscopy - Robotic assisted thoracoscopy - Ivor-Lewis esophagectomy - Pyloromyotomy - Laparoscopic jejunostomy tube placement 2F - Intercostal nerve block 2. History of hypertension 3. History of diabetes mellitus 4. History of DVT 5. History of coronary artery disease (s/p PCI 2011) 6. Post-op atrial fibrillation 7. Expected acute blood loss anemia  Consults: None  Procedure (s):  S/p Esophagoscopy - Robotic assisted laparoscopy - Robotic assisted thoracoscopy - Ivor-Lewis esophagectomy - Pyloromyotomy - Laparoscopic jejunostomy tube placement 2F - Intercostal nerve block by Dr. Lowella Dell on 10/18/2019.  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  He is currently undergoing neoadjuvant chemoradiation and has had significant weight loss during this process.  He did have significant dysphagia due to the radiation and was not been able to tolerate much p.o. intake. This did improve after his last admission and he has been able to gain weight. Also, he underwent his PET/CT, and per Dr. Abran Duke personal read, there did not appear to be any distant disease.  There is still some activity in the esophagus, but this might be reactive.  Dr. Kipp Brood discussed the risks and benefits of a robotic assisted Ivor Lewis esophagectomy, and he is agreeble to proceed at the end of the month. He presented to [facility] on 10/18/2019 in order to undergo the aforementioned surgery.  Brief Hospital Course:   Mr. Hinderman was taken to the  operating room on 10/18/2019.  He underwent Esophagoscopy, Robotic assisted laparoscopy, Robotic assisted thoracoscopy, Ivor-Lewis Esophagectomy, Pyloromyotomy, Laparoscopic jejunostomy tube placement and intercostal nerve block.  He tolerated the procedure without difficulty, was extubated and taken to the PACU in stable condition.  The patient did well post operatively.  His NG tube output remained low and was removed on 10/21/2019.  He was started on tube feeds and tolerated this without development of nausea and vomiting.  He developed atrial fibrillation with RVR.  He was treated with IV Amiodarone bolus and drip.  He converted to NSR without difficulty.  He underwent barium esophagram on 10/22/19.  This showed no evidence of leak.  His tube feeds were transitioned to nightly.  He was started on a clear liquid diet which was progressed to a mechanical soft diet as he tolerated.  His chest tubes were removed on 10/22/2019.  Follow up CXR showed clear lung fields and no large residual fluid collections.  He developed atrial fibrillation that was treated with IV amiodarone initially. He converted back to NSR and the amiodarone was converted to the oral form.  He has been ambulating independently.  His incisions are healing without evidence of infection.  He is medically stable for discharge home today.  He was instructed to crush all oral tablet  medications and take with applesauce or similar food.   Latest Vital Signs: Blood pressure 120/76, pulse 83, temperature 98.4 F (36.9 C), temperature source Oral, resp. rate 20, height '6\' 2"'  (1.88 m), weight 114.7 kg, SpO2 95 %.  Physical Exam: General appearance:alert, cooperative and  no distress Neurologic:intact Heart:regular rate and rhythm Lungs:Breath sounds are clear. Good sats on RA. Abdomen:soft, NT. J-tube site is intact and dry. Extremities:well perfused, minimal edema Wound: all incisions are dry. No further drainage from the right chest tube  site.  He has some induration and erythema at an old IV site at his left forearm. No drainage at the site.   Discharge Condition: Stable and discharged to home.  Recent laboratory studies:  Lab Results  Component Value Date   WBC 8.8 10/25/2019   HGB 9.1 (L) 10/25/2019   HCT 27.2 (L) 10/25/2019   MCV 92.5 10/25/2019   PLT 202 10/25/2019   Lab Results  Component Value Date   NA 129 (L) 10/25/2019   K 4.6 10/25/2019   CL 99 10/25/2019   CO2 22 10/25/2019   CREATININE 0.70 10/25/2019   GLUCOSE 221 (H) 10/25/2019      Diagnostic Studies: DG Chest 2 View  Result Date: 10/25/2019 CLINICAL DATA:  Pleural effusion. EXAM: CHEST - 2 VIEW COMPARISON:  10/19/2019 FINDINGS: RIGHT-sided PowerPort tip overlies the superior vena cava. Heart size is normal. There is increased opacity in the retrocardiac region in the LEFT LOWER lobe consistent with pleural effusion and atelectasis or consolidation. RIGHT lung is clear. There is residual contrast in the bowel loops in the LEFT UPPER QUADRANT. IMPRESSION: Increased opacity in the LEFT LOWER lobe consistent with pleural effusion and atelectasis or consolidation. Electronically Signed   By: Nolon Nations M.D.   On: 10/25/2019 12:07   DG Chest 2 View  Result Date: 10/18/2019 CLINICAL DATA:  Preop. EXAM: CHEST - 2 VIEW COMPARISON:  Chest CT 06/21/2019 FINDINGS: Normal heart size and mediastinal contours. Port with tip at the [facility]. No acute infiltrate or edema. No effusion or pneumothorax. No acute osseous findings. Spondylosis. IMPRESSION: No evidence of active disease. Electronically Signed   By: Monte Fantasia M.D.   On: 10/18/2019 06:25   NM PET Image Restag (PS) Skull Base To Thigh  Result Date: 10/02/2019 CLINICAL DATA:  Subsequent treatment strategy for GE junction adenocarcinoma. EXAM: NUCLEAR MEDICINE PET SKULL BASE TO THIGH TECHNIQUE: 11.7 mCi F-18 FDG was injected intravenously. Full-ring PET imaging was performed from the skull base to thigh  after the radiotracer. CT data was obtained and used for attenuation correction and anatomic localization. Fasting blood glucose: 153 mg/dl COMPARISON:  PET-CT dated 07/21/2019 FINDINGS: Mediastinal blood pool activity: SUV max 2.4 Liver activity: SUV max NA NECK: No hypermetabolic cervical lymphadenopathy. Incidental CT findings: none CHEST: Mild residual hypermetabolism involving the distal esophagus, max SUV 4.3, with associated mild wall thickening extending from the distal esophagus (series 4/image 96) to the GE junction (series 4/image 103). No suspicious pulmonary nodules. No hypermetabolic thoracic lymphadenopathy. Incidental CT findings: Right chest port terminates the cavoatrial junction. 3 vessel coronary atherosclerosis. ABDOMEN/PELVIS: No hypermetabolic abdominopelvic lymphadenopathy. Specifically, no gastrohepatic/celiac axis lymphadenopathy. No abnormal hypermetabolism in the liver, spleen, pancreas, or adrenal glands. Incidental CT findings: Layering gallstones (series 4/image 120). Nonobstructing bilateral renal calculi measuring up to 9 mm in the left lower pole (series 4/image 141). Mild atherosclerotic calcifications of the abdominal aorta. SKELETON: No focal hypermetabolic activity to suggest skeletal metastasis. Incidental CT findings: none IMPRESSION: Mild residual hypermetabolism in the distal esophagus, at the site of known primary gastroesophageal adenocarcinoma, improved. No findings suspicious for metastatic disease. Electronically Signed   By: Julian Hy M.D.   On: 10/02/2019 00:41   DG CHEST PORT 1 VIEW  Result Date: 10/20/2019 CLINICAL DATA:  Right-sided chest tube. EXAM: PORTABLE CHEST 1 VIEW COMPARISON:  10/19/2019 FINDINGS: The right chest tube is stable. No pneumothorax identified. The NG tube and right IJ power ports are stable. The cardiac silhouette, mediastinal and hilar contours are unchanged. Persistent patchy left basilar atelectasis and possible small left  effusion. IMPRESSION: 1. Stable support apparatus. 2. No right-sided pneumothorax. 3. Persistent patchy left basilar atelectasis and possible small left effusion. Electronically Signed   By: Marijo Sanes M.D.   On: 10/20/2019 08:21   DG CHEST PORT 1 VIEW  Result Date: 10/19/2019 CLINICAL DATA:  Follow-up right chest tube EXAM: PORTABLE CHEST 1 VIEW COMPARISON:  10/18/2019 FINDINGS: Cardiac shadow is stable. Gastric catheter remains in the stomach. Right chest wall port is again seen. Right chest tube is also seen. No definitive pneumothorax is noted. Atelectasis is again noted in the left base although the overall degree of aeration is improved from the prior study. Previously seen free air is less well visualized. IMPRESSION: Tubes and lines as described above. Improved left basilar atelectasis. Electronically Signed   By: Inez Catalina M.D.   On: 10/19/2019 08:17   DG Chest Port 1 View  Result Date: 10/18/2019 CLINICAL DATA:  NG placement, postop esophageal surgery EXAM: PORTABLE CHEST 1 VIEW COMPARISON:  PET CT 10/01/2019 FINDINGS: Right-sided central venous port tip over the SVC. Esophageal tube tip overlies the mid gastric region, side-port in expected location of GE junction. Small amount of free air beneath the right diaphragm is presumably postoperative. Mild airspace disease at the left base likely reflects atelectasis. No pleural effusion or pneumothorax. Normal heart size. IMPRESSION: Esophageal tube tip overlies the mid gastric region, side-port in expected location of GE junction. Small amount of free air beneath the right diaphragm is presumably postoperative in nature. Airspace disease at the left base, favor atelectasis. Electronically Signed   By: Donavan Foil M.D.   On: 10/18/2019 19:18   Myocardial Perfusion Imaging  Result Date: 10/07/2019  The left ventricular ejection fraction is mildly decreased (45-54%).  Nuclear stress EF: 51%.  There was no ST segment deviation noted during  stress.  Defect 1: There is a medium defect of moderate severity present in the basal inferolateral, mid inferolateral and apical lateral location.  Defect 2: There is a medium defect of mild severity present in the basal inferior, mid inferior and apical inferior location.  Defect 3: There is a small defect of mild severity present in the apex location.  Findings consistent with prior myocardial infarction.  This is a low risk study.  1. Fixed inferior/inferolateral defect.  Consistent with prior infarct.  Consider echocardiogram to confirm wall motion abnormality 2. No ischemia 3. Low risk study   DG ESOPHAGUS W SINGLE CM (SOL OR THIN BA)  Result Date: 10/22/2019 CLINICAL DATA:  Status post esophagectomy on June 28. History of radiation therapy. EXAM: WATER SOLUBLE CONTRAST ENEMA TECHNIQUE: Real-time fluoroscopy was utilized for evaluation of the esophagus using Omnipaque 300 and thin barium. FLUOROSCOPY TIME:  Fluoroscopy Time:  1 minutes Radiation Exposure Index (if provided by the fluoroscopic device): 4 Number of Acquired Spot Images: 20.1 mGy COMPARISON:  None. FINDINGS: Normal pharyngeal anatomy and motility. Contrast flowed freely through the esophagus and the esophagogastric anastomosis without evidence of a stricture. No extraluminal contrast to suggest a leak. No irregularity or ulceration. Esophageal motility was normal. IMPRESSION: 1. Status post distal esophagectomy with esophagogastric anastomosis. No extraluminal contrast does suggest a leak. Electronically Signed   By: Kathreen Devoid  On: 10/22/2019 10:31    EXAM: CHEST - 2 VIEW  COMPARISON:  10/19/2019  FINDINGS: RIGHT-sided PowerPort tip overlies the superior vena cava. Heart size is normal. There is increased opacity in the retrocardiac region in the LEFT LOWER lobe consistent with pleural effusion and atelectasis or consolidation. RIGHT lung is clear. There is residual contrast in the bowel loops in the LEFT UPPER  QUADRANT.  IMPRESSION: Increased opacity in the LEFT LOWER lobe consistent with pleural effusion and atelectasis or consolidation.   Electronically Signed   By: Nolon Nations M.D.   On: 10/25/2019 12:07    Discharge Medications: Allergies as of 10/25/2019      Reactions   Adhesive [tape] Rash      Medication List    STOP taking these medications   acetaminophen 500 MG tablet Commonly known as: TYLENOL Replaced by: acetaminophen 160 MG/5ML solution   amLODipine 5 MG tablet Commonly known as: NORVASC   Dexilant 60 MG capsule Generic drug: dexlansoprazole   fenofibrate 160 MG tablet   glyBURIDE-metformin 5-500 MG tablet Commonly known as: GLUCOVANCE   metoprolol succinate 50 MG 24 hr tablet Commonly known as: TOPROL-XL     TAKE these medications   acetaminophen 160 MG/5ML solution Commonly known as: TYLENOL Place 20.3 mLs (650 mg total) into feeding tube every 6 (six) hours as needed for moderate pain. Replaces: acetaminophen 500 MG tablet   albuterol 108 (90 Base) MCG/ACT inhaler Commonly known as: VENTOLIN HFA Inhale 2 puffs into the lungs every 6 (six) hours as needed for wheezing or shortness of breath.   ALPRAZolam 0.5 MG tablet Commonly known as: XANAX TAKE ONE-HALF TO ONE TABLET BY MOUTH ONCE DAILY AS NEEDED What changed:   how much to take  how to take this  when to take this  reasons to take this  additional instructions   amiodarone 200 MG tablet Commonly known as: PACERONE Take 1 tablet (200 mg total) by mouth 2 (two) times daily. For 7 days then reduce to 1 tablet (267m) by mouth daily. Must be crushed.   aspirin EC 81 MG tablet Take 1 tablet (81 mg total) by mouth daily. What changed: when to take this   atorvastatin 40 MG tablet Commonly known as: LIPITOR Take 1 tablet by mouth once daily What changed: when to take this   blood glucose meter kit and supplies Test glucose once daily. ICD 10 E11.9   calcium carbonate 500  MG chewable tablet Commonly known as: TUMS - dosed in mg elemental calcium Chew 2 tablets by mouth 2 (two) times daily as needed for indigestion or heartburn.   feeding supplement (OSMOLITE 1.5 CAL) Liqd Place 1,610 mLs into feeding tube daily.   insulin detemir 100 UNIT/ML injection Commonly known as: LEVEMIR Inject 70 Units into the skin daily as needed (in the morning if blood sugar is >150).   Lactulose 20 GM/30ML Soln Take 30 mLs (20 g total) by mouth at bedtime. May increase to twice a day if needed. What changed: additional instructions   lidocaine 2 % solution Commonly known as: XYLOCAINE Use as directed 15 mLs in the mouth or throat as needed for mouth pain. What changed: additional instructions   lidocaine-prilocaine cream Commonly known as: EMLA Apply a small amount to port a cath site and cover with plastic wrap one hour prior to infusion appointments What changed:   how much to take  how to take this  when to take this   metFORMIN 500 MG tablet  Commonly known as: GLUCOPHAGE 2 tablets (1,000 mg total) by Per J Tube route 2 (two) times daily with a meal. Crush and put medication in J-tube   multivitamin with minerals Tabs tablet Take 1 tablet by mouth every evening.   ondansetron 8 MG disintegrating tablet Commonly known as: Zofran ODT Take 1 tablet (8 mg total) by mouth every 8 (eight) hours as needed for nausea or vomiting.   pentoxifylline 400 MG CR tablet Commonly known as: TRENTAL TAKE 1 TABLET BY MOUTH THREE TIMES DAILY WITH MEALS   prochlorperazine 10 MG tablet Commonly known as: COMPAZINE Take 1 tablet (10 mg total) by mouth every 6 (six) hours as needed for nausea or vomiting.            Durable Medical Equipment  (From admission, onward)         Start     Ordered   10/22/19 1343  For home use only DME Tube feeding pump  Once       Question:  Length of Need  Answer:  6 Months   10/22/19 1343          Follow Up Appointments:   Follow-up Information    Lajuana Matte, MD. Go on 10/29/2019.   Specialty: Cardiothoracic Surgery Why: Appointment time is at 9:30 am Contact information: [address] [place] [address] [phone]        [facility] Follow up.   Why: Arrangements made for home TF at night Contact information: [place] [address] [phone]        [facility] Follow up.   Specialty: Home Health Services Why: [facility]- they will contact you for a visit this week for tube feed follow up Contact information: [place] [address] [phone]               Signed: Joline Maxcy 10/25/2019, 12:35 PM

## 2019-10-19 NOTE — Telephone Encounter (Signed)
Pt son called the pharmacy has sent over a refill request for glyBURIDE-metformin (GLUCOVANCE) 5-500 MG  Last week and is needing the refill. Pt is in the hospital at this time and needs to have meds ready when released.

## 2019-10-19 NOTE — Progress Notes (Addendum)
Initial Nutrition Assessment  DOCUMENTATION CODES:   Severe malnutrition in context of chronic illness  INTERVENTION:   Tube feeding: -Osmolite 1.5 @ 20 ml/hr via J-tube -Increase by 10 ml Q6 hours to goal rate of 65 ml/hr (1560 ml) -30 ml Prostat TID  At goal rate TF provides: 2640  kcals, 129 grams protein, 1189 ml free water.   Monitor magnesium, potassium, and phosphorus daily for at least 3 days, MD to replete as needed  NUTRITION DIAGNOSIS:   Severe Malnutrition related to chronic illness, cancer and cancer related treatments as evidenced by severe muscle depletion, moderate fat depletion, percent weight loss.  GOAL:   Patient will meet greater than or equal to 90% of their needs  MONITOR:   PO intake, Diet advancement, Skin, TF tolerance, Weight trends, Labs, I & O's  REASON FOR ASSESSMENT:   Consult Enteral/tube feeding initiation and management  ASSESSMENT:   Patient with PMH significant for CHF, CAD , HLD, HTN, MI, DM, and GE junction adenocarcinoma s/p chemotherapy/radiation. Presents this admission for esophagectomy.   6/28- s/p Ivor-Lewis esophagectomy, s/p J-tube  Pt lethargic upon assessment. States his appetite was better after last admission on 6/1. Unable to elaborate on meal composition due to being in pain. Pt spoke with outptaient RD on 6/11 and reported eating hamburger steak, mashed potatoes, oatmeal, and grits. He was trying to drink one protein shake a day. Plan SLP evaluation Thursday. Will titrate TF to goal. Monitor for refeeding.   Pt endorses a UBW of 300 lb and a 70 lb unintentional weight loss over the last year. Records indicate pt weighed 301 on 05/05/19 and 237 lb this admission (21% wt loss in one year, significant for time frame).   Medications: SS novolog Labs: CBG 86-284    NUTRITION - FOCUSED PHYSICAL EXAM:    Most Recent Value  Orbital Region Mild depletion  Upper Arm Region Mild depletion  Thoracic and Lumbar Region Unable  to assess  Buccal Region Moderate depletion  Temple Region Moderate depletion  Clavicle Bone Region Severe depletion  Clavicle and Acromion Bone Region Severe depletion  Scapular Bone Region Unable to assess  Dorsal Hand Mild depletion  Patellar Region Moderate depletion  Anterior Thigh Region Moderate depletion  Posterior Calf Region Moderate depletion  Edema (RD Assessment) Mild  Hair Reviewed  Eyes Reviewed  Mouth Reviewed  Skin Reviewed  Nails Reviewed     Diet Order:   Diet Order    None      EDUCATION NEEDS:   Education needs have been addressed  Skin:  Skin Assessment: Skin Integrity Issues: Skin Integrity Issues:: Incisions Incisions: abdomen, chest  Last BM:  6/27  Height:   Ht Readings from Last 1 Encounters:  10/18/19 6\' 2"  (1.88 m)    Weight:   Wt Readings from Last 1 Encounters:  10/18/19 107.5 kg    BMI:  Body mass index is 30.42 kg/m.  Estimated Nutritional Needs:   Kcal:  2500-2700 kcal  Protein:  125-150 grams  Fluid:  >/= 2.5 L/day   Mariana Single RD, LDN Clinical Nutrition Pager listed in [facility]

## 2019-10-20 ENCOUNTER — Inpatient Hospital Stay (HOSPITAL_COMMUNITY): Payer: BC Managed Care – PPO

## 2019-10-20 DIAGNOSIS — E43 Unspecified severe protein-calorie malnutrition: Secondary | ICD-10-CM | POA: Insufficient documentation

## 2019-10-20 LAB — MAGNESIUM
Magnesium: 2.2 mg/dL (ref 1.7–2.4)
Magnesium: 2.6 mg/dL — ABNORMAL HIGH (ref 1.7–2.4)

## 2019-10-20 LAB — GLUCOSE, CAPILLARY
Glucose-Capillary: 158 mg/dL — ABNORMAL HIGH (ref 70–99)
Glucose-Capillary: 194 mg/dL — ABNORMAL HIGH (ref 70–99)
Glucose-Capillary: 206 mg/dL — ABNORMAL HIGH (ref 70–99)
Glucose-Capillary: 229 mg/dL — ABNORMAL HIGH (ref 70–99)
Glucose-Capillary: 230 mg/dL — ABNORMAL HIGH (ref 70–99)
Glucose-Capillary: 260 mg/dL — ABNORMAL HIGH (ref 70–99)

## 2019-10-20 LAB — PHOSPHORUS
Phosphorus: 2.4 mg/dL — ABNORMAL LOW (ref 2.5–4.6)
Phosphorus: 2.5 mg/dL (ref 2.5–4.6)

## 2019-10-20 MED ORDER — METOPROLOL TARTRATE 5 MG/5ML IV SOLN
2.5000 mg | Freq: Four times a day (QID) | INTRAVENOUS | Status: DC
  Administered 2019-10-20 – 2019-10-25 (×20): 2.5 mg via INTRAVENOUS
  Filled 2019-10-20 (×20): qty 5

## 2019-10-20 MED ORDER — METFORMIN HCL 500 MG PO TABS
1000.0000 mg | ORAL_TABLET | Freq: Two times a day (BID) | ORAL | 0 refills | Status: DC
Start: 2019-10-20 — End: 2019-11-07

## 2019-10-20 MED ORDER — INSULIN ASPART 100 UNIT/ML ~~LOC~~ SOLN
3.0000 [IU] | Freq: Four times a day (QID) | SUBCUTANEOUS | Status: DC
  Administered 2019-10-20 – 2019-10-25 (×17): 3 [IU] via SUBCUTANEOUS

## 2019-10-20 MED ORDER — INSULIN DETEMIR 100 UNIT/ML ~~LOC~~ SOLN
10.0000 [IU] | Freq: Every day | SUBCUTANEOUS | Status: DC
  Administered 2019-10-20 – 2019-10-23 (×4): 10 [IU] via SUBCUTANEOUS
  Filled 2019-10-20 (×6): qty 0.1

## 2019-10-20 NOTE — Progress Notes (Signed)
Inpatient Diabetes Program Recommendations  AACE/ADA: New Consensus Statement on Inpatient Glycemic Control (2015)  Target Ranges:  Prepandial:   less than 140 mg/dL      Peak postprandial:   less than 180 mg/dL (1-2 hours)      Critically ill patients:  140 - 180 mg/dL   Lab Results  Component Value Date   GLUCAP 229 (H) 10/20/2019   HGBA1C 7.8 (H) 09/21/2019    Review of Glycemic Control Results for JONAN, SEUFERT (MRN [id number]) as of 10/20/2019 15:12  Ref. Range 10/19/2019 19:49 10/19/2019 23:18 10/20/2019 04:02 10/20/2019 08:30 10/20/2019 11:11  Glucose-Capillary Latest Ref Range: 70 - 99 mg/dL 169 (H) 195 (H) 194 (H) 230 (H) 229 (H)     Inpatient Diabetes Program Recommendations:     Received referral for "insulin with NPO & TF"  Patient is receiving tube feeds at 65 ml/hr.    Please consider adding Levemir 10 units daily If CBG's remain elevated consider Novolog 3 units tube feed coverage q4h-stop if tube feeds held or discontinued.   Will continue to follow while inpatient.  Thank you, Reche Dixon, RN, BSN Diabetes Coordinator Inpatient Diabetes Program [phone] (team pager from 8a-5p)

## 2019-10-20 NOTE — Telephone Encounter (Signed)
Patient will be released on regular meds- progress note from today states patient will continue Levemir and Glucovance at home ( patient had j tube placement for medication and meals till esophageal healing )

## 2019-10-20 NOTE — Telephone Encounter (Signed)
Lm to return call.

## 2019-10-20 NOTE — Telephone Encounter (Signed)
While pt is in hospital and having the hospitalist or surgeon rounding on him, ask them if they want him on oral medications still for diabetes when he leaves or if they want him on insulin for a little while during the healing process of esophagus.   Thx.   Dr. Lovena Le

## 2019-10-20 NOTE — Progress Notes (Signed)
Mobility Specialist - Progress Note   10/20/19 1315  Mobility  Activity Ambulated in hall  Level of Assistance Modified independent, requires aide device or extra time  Assistive Device Front wheel walker  Distance Ambulated (ft) 500 ft  Mobility Response Tolerated well  Mobility performed by Mobility specialist  $Mobility charge 1 Mobility    Pre-mobility: 95 HR, 96% SpO2 Post-mobility: 86 HR, 100% SpO2   Steven Crane Transport planner Phone: [phone]

## 2019-10-20 NOTE — Progress Notes (Addendum)
      [address]       [place] [address]             [phone]      2 Days Post-Op Procedure(s) (LRB): XI ROBOTIC ASSISTED THORASCOPY-IVOR LEWIS ESOPHAGECTOMY (N/A) ESOPHAGOGASTRODUODENOSCOPY (EGD) (N/A) INTERCOSTAL NERVE BLOCK NODE DISSECTION XI ROBOTIC ASSISTED JEJUNOSTOMY TUBE PLACEMENT (Left) Subjective: Feels okay this morning. He has walked already this morning.   Objective: Vital signs in last 24 hours: Temp:  [97.5 F (36.4 C)-98.4 F (36.9 C)] 97.6 F (36.4 C) (06/30 0808) Pulse Rate:  [86-90] 90 (06/30 0808) Cardiac Rhythm: Normal sinus rhythm (06/30 0722) Resp:  [18-20] 19 (06/30 0808) BP: (122-140)/(72-85) 122/74 (06/30 0808) SpO2:  [95 %-98 %] 96 % (06/30 0808) Weight:  [100.7 kg] 100.7 kg (06/30 0500)     Intake/Output from previous day: 06/29 0701 - 06/30 0700 In: 669.1 [NG/GT:509.1; IV Piggyback:100] Out: 970 [Urine:800; Emesis/NG output:100; Chest Tube:70] Intake/Output this shift: No intake/output data recorded.  General appearance: alert, cooperative and no distress Heart: regular rate and rhythm, S1, S2 normal, no murmur, click, rub or gallop Lungs: clear to auscultation bilaterally Abdomen: soft, non-tender; bowel sounds normal; no masses,  no organomegaly Extremities: extremities normal, atraumatic, no cyanosis or edema Wound: clean and dry  Lab Results: Recent Labs    10/18/19 1800 10/19/19 0327  WBC 10.1 11.4*  HGB 10.9* 10.4*  HCT 32.7* 31.0*  PLT 186 188   BMET:  Recent Labs    10/18/19 1800 10/19/19 0327  NA 137 139  K 4.2 4.0  CL 105 107  CO2 19* 23  GLUCOSE 313* 134*  BUN 11 12  CREATININE 1.10 0.85  CALCIUM 8.8* 8.9    PT/INR: No results for input(s): LABPROT, INR in the last 72 hours. ABG    Component Value Date/Time   PHART 7.462 (H) 10/19/2019 0510   HCO3 23.7 10/19/2019 0510   TCO2 26 10/18/2019 1640   ACIDBASEDEF 3.9 (H) 10/18/2019 1809   O2SAT 98.4 10/19/2019 0510   CBG (last 3)    Recent Labs    10/19/19 2318 10/20/19 0402 10/20/19 0830  GLUCAP 195* 194* 230*    Assessment/Plan: S/P Procedure(s) (LRB): XI ROBOTIC ASSISTED THORASCOPY-IVOR LEWIS ESOPHAGECTOMY (N/A) ESOPHAGOGASTRODUODENOSCOPY (EGD) (N/A) INTERCOSTAL NERVE BLOCK NODE DISSECTION XI ROBOTIC ASSISTED JEJUNOSTOMY TUBE PLACEMENT (Left)  1. CV-NSR in the 90s, BP well controlled 2. Pulm-toleating room air with good oxygen saturation. Chest tube with 70cc output. CXR is stable.  3. GI-NPO, IVF. TFs to start soon. NGT with 100 cc/24 hours Swallow study in a few days. J tube feedings started per RD 4. Endo- CBGs have been elevated. May need Levemir added since he was on 70 units daily at home as well as Glucovance.  Plan: Possibly okay to remove chest tube today. CXR stable and output is minimal. Continue TF and NPO status. Continue NG tube. Discontinue foley.    LOS: 2 days    Elgie Collard 10/20/2019   No events overnight. Pain well controlled Minimal chest tube and NG output output Started on tube feeds We will likely remove NG tube tomorrow.  Matheo Rathbone Bary Leriche

## 2019-10-20 NOTE — Telephone Encounter (Signed)
Pt returned call (very hoarse) and verbalized understanding.

## 2019-10-20 NOTE — Telephone Encounter (Signed)
Spoke with son who stated patient had major surgery on Monday and will be in hospital till end of week or weekend and they are trying to get everything set up for him at home (since holiday weekend) including his meds to be set for him when he gets home. Patient had refills on all his meds but that one med. They can schedule a phone visit when patient is doing a little better if possible

## 2019-10-21 ENCOUNTER — Other Ambulatory Visit: Payer: Self-pay

## 2019-10-21 LAB — GLUCOSE, CAPILLARY
Glucose-Capillary: 174 mg/dL — ABNORMAL HIGH (ref 70–99)
Glucose-Capillary: 198 mg/dL — ABNORMAL HIGH (ref 70–99)
Glucose-Capillary: 237 mg/dL — ABNORMAL HIGH (ref 70–99)
Glucose-Capillary: 184 mg/dL — ABNORMAL HIGH (ref 70–99)
Glucose-Capillary: 192 mg/dL — ABNORMAL HIGH (ref 70–99)

## 2019-10-21 LAB — SURGICAL PATHOLOGY

## 2019-10-21 MED ORDER — AMIODARONE LOAD VIA INFUSION
150.0000 mg | Freq: Once | INTRAVENOUS | Status: DC
  Filled 2019-10-21: qty 83.34

## 2019-10-21 MED ORDER — AMIODARONE HCL IN DEXTROSE 360-4.14 MG/200ML-% IV SOLN
60.0000 mg/h | INTRAVENOUS | Status: AC
  Administered 2019-10-21 (×2): 60 mg/h via INTRAVENOUS
  Filled 2019-10-21: qty 200

## 2019-10-21 MED ORDER — AMIODARONE IV BOLUS ONLY 150 MG/100ML
150.0000 mg | Freq: Once | INTRAVENOUS | Status: AC
  Administered 2019-10-21: 150 mg via INTRAVENOUS
  Filled 2019-10-21: qty 100

## 2019-10-21 MED ORDER — AMIODARONE HCL IN DEXTROSE 360-4.14 MG/200ML-% IV SOLN
30.0000 mg/h | INTRAVENOUS | Status: DC
  Administered 2019-10-21 – 2019-10-23 (×4): 30 mg/h via INTRAVENOUS
  Filled 2019-10-21 (×4): qty 200

## 2019-10-21 MED ORDER — METOPROLOL TARTRATE 5 MG/5ML IV SOLN
2.5000 mg | Freq: Once | INTRAVENOUS | Status: AC
  Administered 2019-10-21: 2.5 mg via INTRAVENOUS
  Filled 2019-10-21: qty 5

## 2019-10-21 NOTE — Progress Notes (Signed)
Pt'son, Donley Redder called by phone. Update given and all questions answered. He expressed appreciations.   Kennyth Lose, RN

## 2019-10-21 NOTE — Progress Notes (Signed)
Patient remains in afib with RVR 120's140's. PA Jadene Pierini aware and will see patient. Patient resting comfortably at this time. C/O of some chest pressure VSS.

## 2019-10-21 NOTE — Progress Notes (Signed)
°   °  [address]       [place] [address]             [phone]       A. fib with RVR this a.m. Otherwise no major complaints Today's Vitals   10/21/19 0615 10/21/19 0700 10/21/19 0745 10/21/19 0800  BP:  116/79 107/88 (!) 120/91  Pulse: (!) 120 (!) 107 (!) 146   Resp: 20 19 18 19   Temp:   98.2 F (36.8 C)   TempSrc:   Oral   SpO2: 95% 96% 95% 94%  Weight:  107.9 kg    Height:      PainSc:       Body mass index is 30.54 kg/m.  Alert NAD A. Fib Easy work of breathing.  Serosanguineous chest tube output Abdomen soft  59 year old male postoperative day 3 status post robotic assisted Ivor Lewis esophagectomy. NG tube has been removed We will obtain swallow study tomorrow Started on amnio protocol for A. fib with RVR Continue tube feeds  Steven Crane O Sha Burling

## 2019-10-21 NOTE — Progress Notes (Signed)
EKG 12 leads and on monitor showed new onset of Atrial fib with RVR, HR 120-140, BP remained in normal limits. Pt was asymptomatic, alert and oriented x 4, denied chest pain, no feeling of heart palpitation, no SOB.   Notified Dr. Orvan Seen, on call provider, order received for Metoprolol 2.5 mg + 2.5 mg  IV and continue to monitor. Suggested to wait for morning round due to Pt is asymptomatically.   Kennyth Lose, RN

## 2019-10-22 ENCOUNTER — Encounter (HOSPITAL_COMMUNITY): Payer: BC Managed Care – PPO

## 2019-10-22 ENCOUNTER — Inpatient Hospital Stay (HOSPITAL_COMMUNITY): Payer: BC Managed Care – PPO

## 2019-10-22 LAB — GLUCOSE, CAPILLARY
Glucose-Capillary: 152 mg/dL — ABNORMAL HIGH (ref 70–99)
Glucose-Capillary: 154 mg/dL — ABNORMAL HIGH (ref 70–99)
Glucose-Capillary: 169 mg/dL — ABNORMAL HIGH (ref 70–99)
Glucose-Capillary: 206 mg/dL — ABNORMAL HIGH (ref 70–99)
Glucose-Capillary: 208 mg/dL — ABNORMAL HIGH (ref 70–99)
Glucose-Capillary: 244 mg/dL — ABNORMAL HIGH (ref 70–99)

## 2019-10-22 MED ORDER — PRO-STAT SUGAR FREE PO LIQD
30.0000 mL | Freq: Two times a day (BID) | ORAL | Status: DC
  Administered 2019-10-22 – 2019-10-25 (×6): 30 mL
  Filled 2019-10-22 (×6): qty 30

## 2019-10-22 MED ORDER — OSMOLITE 1.5 CAL PO LIQD
1610.0000 mL | ORAL | Status: DC
  Administered 2019-10-22 – 2019-10-24 (×3): 1610 mL
  Administered 2019-10-24: 1000 mL
  Filled 2019-10-22 (×5): qty 2000

## 2019-10-22 NOTE — TOC Initial Note (Addendum)
Transition of Care (TOC) - Initial/Assessment Note  Marvetta Gibbons RN, BSN Transitions of Care Unit 4E- RN Case Manager See Treatment Team for direct phone #    Patient Details  Name: Steven Crane MRN: [id number] Date of Birth: 1960-11-27  Transition of Care [facility]) CM/SW Contact:    Dawayne Patricia, RN Phone Number: 10/22/2019, 3:26 PM  Clinical Narrative:                 Pt from home s/p ROBOTIC ASSISTED THORASCOPY-IVOR LEWIS ESOPHAGECTOMY (N/A) ESOPHAGOGASTRODUODENOSCOPY for esophageal cancer.  Received referral for nightime TF needs at home. Spoke with RD regarding transition of care needs for night time TF- recommendations placed.  Spoke with pt and son at bedside for transition of care needs- pt does not have preference for DME agency to supply TF needs- agreeable to [facility] and would prefer delivery of supplies to home.  Per pt he has all needed DME at home as far as RW and BSC, ect. HHRN order placed- pt has [employer] comm. - this will be difficult to secure agency- may need to ask was the RN need is for home. [facility] will be able to f/u for TF needs. Discussed HH with pt- and his main concern is making sure he knows what to do with TF at home and how to manage his J- tube- which education can be done here by bedside RN prior to discharge.  Family to transport home.  Pt had swallow eval today- results pending.   Call made to [facility] for home TF needs- order form placed on shadow chart for signature- spoke with Parks Neptune- PA with TCTS who will sign form later- once signed will need to fax back to [facility] to process.   Expected Discharge Plan: Home/Self Care Barriers to Discharge: Continued Medical Work up   Patient Goals and CMS Choice Patient states their goals for this hospitalization and ongoing recovery are:: return home CMS Medicare.gov Compare Post Acute Care list provided to:: Patient Choice offered to / list presented to : Patient  Expected Discharge Plan and Services Expected  Discharge Plan: Home/Self Care   Discharge Planning Services: CM Consult Post Acute Care Choice: Durable Medical Equipment Living arrangements for the past 2 months: Single Family Home                 DME Arranged: Tube feeding, Tube feeding pump DME Agency: [facility] Date DME Agency Contacted: 10/22/19 Time DME Agency Contacted: 1400 Representative spoke with at DME Agency: Thedore Mins            Prior Living Arrangements/Services Living arrangements for the past 2 months: [place] with:: Self, Relatives Patient language and need for interpreter reviewed:: Yes Do you feel safe going back to the place where you live?: Yes      Need for Family Participation in Patient Care: Yes (Comment) Care giver support system in place?: Yes (comment) Current home services: DME Criminal Activity/Legal Involvement Pertinent to Current Situation/Hospitalization: No - Comment as needed  Activities of Daily Living      Permission Sought/Granted Permission sought to share information with : Chartered certified accountant granted to share information with : Yes, Verbal Permission Granted     Permission granted to share info w AGENCY: [facility]        Emotional Assessment Appearance:: Appears stated age Attitude/Demeanor/Rapport: Engaged Affect (typically observed): Appropriate, Pleasant Orientation: : Oriented to Self, Oriented to Place, Oriented to  Time, Oriented to Situation Alcohol / Substance  Use: Not Applicable Psych Involvement: No (comment)  Admission diagnosis:  Esophageal cancer [facility]) [C15.9] Patient Active Problem List   Diagnosis Date Noted  . Protein-calorie malnutrition, severe 10/20/2019  . Esophageal cancer ([place]) 10/18/2019  . Failure to thrive (0-17) 09/21/2019  . Neutropenia, drug-induced ([place]) 08/23/2019  . 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 GERD (gastroesophageal reflux  disease)   . Esophageal dysphagia   . Displaced oblique fracture of shaft of left tibia, initial encounter for closed fracture 08/21/2018  . Displaced comminuted fracture of shaft of left tibia, initial encounter for closed fracture 08/21/2018  . Unstable angina ([place]) 08/27/2013  . CAD S/P percutaneous coronary angioplasty 2011 08/06/2013  . Chest pain 08/06/2013  . Kidney stone 04/05/2013  . Reactive airways dysfunction syndrome ([place]) 11/30/2012  . Hyperlipemia 09/25/2012  . Diabetes mellitus without complication ([place]) 41/28/7867  . Atherosclerotic heart disease 09/25/2012  . Essential hypertension, benign 09/25/2012  . Morbid obesity ([place]) 09/25/2012  . Psoriasis 09/25/2012  . History of DVT (deep vein thrombosis) 09/25/2012   PCP:  Erven Colla, DO Pharmacy:   [facility] [address], [place] [address] Phone: [phone] Fax: [phone]     Social Determinants of Health (SDOH) Interventions    Readmission Risk Interventions No flowsheet data found.

## 2019-10-22 NOTE — Progress Notes (Signed)
Nutrition Follow up  DOCUMENTATION CODES:   Severe malnutrition in context of chronic illness  INTERVENTION:   Transition to nocturnal ube feeding: -Osmolite 1.5 @ 115 ml/hr x 14 hours (6pm-8am) -30 ml Prostat or other protein modular BID -Free water flushes 250 ml Q6 hours   TF provides: 2615 kcals, 131 grams protein, 1227 ml free water (2227 ml with flushes)  Tube feeding rate could be decreased once PO intake progresses and remains consistent.   NUTRITION DIAGNOSIS:   Severe Malnutrition related to chronic illness, cancer and cancer related treatments as evidenced by severe muscle depletion, moderate fat depletion, percent weight loss.  Ongoing   GOAL:   Patient will meet greater than or equal to 90% of their needs   Addressed via TF  MONITOR:   PO intake, Diet advancement, Skin, TF tolerance, Weight trends, Labs, I & O's  REASON FOR ASSESSMENT:   Consult Enteral/tube feeding initiation and management  ASSESSMENT:   Patient with PMH significant for CHF, CAD , HLD, HTN, MI, DM, and GE junction adenocarcinoma s/p chemotherapy/radiation. Presents this admission for esophagectomy.   6/28- s/p Ivor-Lewis esophagectomy, s/p J-tube  Pt tolerating Osmolite 1.5 at goal rate. Having BMs. Denies abdominal pain/nausea. Diet advanced to DYS 1 with thin liquids. Awaiting first meal tray. TCTS requesting to change tube feeding to nocturnal. Agreed on best time for pt and provided brief overview on how home process works. Recommend continuing tube feeding to meet 100% of needs until PO intake remains consistent given severity of malnutrition.   Admission weight: 107.5 kg  Current weight: 107.7 kg   Medications: SS novolog, levemir Labs: CBG 152-237  Diet Order:   Diet Order            DIET - DYS 1 Room service appropriate? Yes; Fluid consistency: Thin  Diet effective now                 EDUCATION NEEDS:   Education needs have been addressed  Skin:  Skin  Assessment: Skin Integrity Issues: Skin Integrity Issues:: Incisions, Other (Comment) Incisions: abdomen, chest Other: MASD- perineum  Last BM:  7/1  Height:   Ht Readings from Last 1 Encounters:  10/18/19 6\' 2"  (1.88 m)    Weight:   Wt Readings from Last 1 Encounters:  10/22/19 107.7 kg    BMI:  Body mass index is 30.48 kg/m.  Estimated Nutritional Needs:   Kcal:  2500-2700 kcal  Protein:  125-150 grams  Fluid:  >/= 2.5 L/day   Mariana Single RD, LDN Clinical Nutrition Pager listed in [place]

## 2019-10-22 NOTE — Progress Notes (Signed)
Plan of care reviewed. Pt's been progressing. Appeared alert and oriented x 4. He's tearfully talking with his son, Broadus John in the evening.  He expressed his concern about his health problems. After emotional support given., he's able to cope with stress and stated that he's able to rest better tonight.    He's hemodynamically stable. His EKG converted back to NSR since 07.00 pm, HR 70s-80s , BP within normal limits, SPO2 95-98% on room air, remained afebrile.  Chest tube total output 80 ml in the past 24 hours. Minimal serous drainage appeared around chest tube incision site. Dry dressing changed. Continue with - 20 cmH2O of wall suction.   Jejunostomy tube feeding with full strange at 65 ml/hr, flushed 30 ml q 8 hr per order. Pt's able to tolerated well, no aspiration. Abdominal soft, no distention, positive bowel sound. No immediate distress noted tonight. Continue to monitor.    Kennyth Lose, RN

## 2019-10-22 NOTE — Progress Notes (Signed)
Pt's chest tube removed per MD order.  Pt tolerated well.  Pt will be on bedrest and vitals checked per protocol.  No complication to report at this time.

## 2019-10-22 NOTE — Progress Notes (Addendum)
      [address]       [place] [address]             [phone]       4 Days Post-Op Procedure(s) (LRB): XI ROBOTIC ASSISTED THORASCOPY-IVOR LEWIS ESOPHAGECTOMY (N/A) ESOPHAGOGASTRODUODENOSCOPY (EGD) (N/A) INTERCOSTAL NERVE BLOCK NODE DISSECTION XI ROBOTIC ASSISTED JEJUNOSTOMY TUBE PLACEMENT (Left) Subjective: Resting comfortably. No new concerns.  TF on hold for swallow study later this morning.  BM last night.   Objective: Vital signs in last 24 hours: Temp:  [97.5 F (36.4 C)-99.4 F (37.4 C)] 98.6 F (37 C) (07/02 0757) Pulse Rate:  [55-141] 72 (07/02 0757) Cardiac Rhythm: Normal sinus rhythm (07/02 0500) Resp:  [16-23] 17 (07/02 0757) BP: (103-135)/(70-87) 125/72 (07/02 0757) SpO2:  [93 %-97 %] 95 % (07/02 0757) Weight:  [107.7 kg] 107.7 kg (07/02 0300)   Intake/Output from previous day: 07/01 0701 - 07/02 0700 In: 2372 [I.V.:697; NG/GT:1495] Out: 605 [Urine:525; Chest Tube:80] Intake/Output this shift: No intake/output data recorded.  General appearance: alert, cooperative and no distress Neurologic: intact Heart: Afib yesterday, started on amiodrone infusion. Converted to SR last evening.  Lungs: clear to auscultation bilaterally. Right pleural drain remains secure with 9ml output past 24 hours.  Abdomen: soft, NT.  Extremities: no significant peripheral edema Wound: abdominal port ncisions are all intact and dry.   Lab Results: No results for input(s): WBC, HGB, HCT, PLT in the last 72 hours. BMET: No results for input(s): NA, K, CL, CO2, GLUCOSE, BUN, CREATININE, CALCIUM in the last 72 hours.  PT/INR: No results for input(s): LABPROT, INR in the last 72 hours. ABG    Component Value Date/Time   PHART 7.462 (H) 10/19/2019 0510   HCO3 23.7 10/19/2019 0510   TCO2 26 10/18/2019 1640   ACIDBASEDEF 3.9 (H) 10/18/2019 1809   O2SAT 98.4 10/19/2019 0510   CBG (last 3)  Recent Labs    10/22/19 0039 10/22/19 0350 10/22/19 0755    GLUCAP 206* 152* 169*    Assessment/Plan: S/P Procedure(s) (LRB): XI ROBOTIC ASSISTED THORASCOPY-IVOR LEWIS ESOPHAGECTOMY (N/A) ESOPHAGOGASTRODUODENOSCOPY (EGD) (N/A) INTERCOSTAL NERVE BLOCK NODE DISSECTION XI ROBOTIC ASSISTED JEJUNOSTOMY TUBE PLACEMENT (Left)  -POD4 robotic assisted esophagectomy for adenocarcinoma at GE junction. Progressing well, has been tolerating TF with no trouble. BM last night.  Plan swallow study later this morning.   -Post-op atrial fibrillation-back in SR after standard IV amiodarone load and infusion. Continue for now. Recheck K+ in AM.  -History of DVT- has SCD's  -Diabetes mellitus-  Glucose ~150-200 past 24 hours.  Continue Levemir, Novolog, and SSI.   LOS: 4 days    Antony Odea, [place] [phone] 10/22/2019  Patient doing well. Swallow study does not show a leak Advancing to dysphagia diet. We will cycle tube feeds for nocturnal feeds Disposition planning. Likely will go home this weekend or Monday.  Dreonna Hussein Bary Leriche

## 2019-10-23 LAB — CBC
HCT: 26.3 % — ABNORMAL LOW (ref 39.0–52.0)
Hemoglobin: 8.7 g/dL — ABNORMAL LOW (ref 13.0–17.0)
MCH: 31.1 pg (ref 26.0–34.0)
MCHC: 33.1 g/dL (ref 30.0–36.0)
MCV: 93.9 fL (ref 80.0–100.0)
Platelets: 182 10*3/uL (ref 150–400)
RBC: 2.8 MIL/uL — ABNORMAL LOW (ref 4.22–5.81)
RDW: 16.2 % — ABNORMAL HIGH (ref 11.5–15.5)
WBC: 7.7 10*3/uL (ref 4.0–10.5)
nRBC: 0 % (ref 0.0–0.2)

## 2019-10-23 LAB — GLUCOSE, CAPILLARY
Glucose-Capillary: 150 mg/dL — ABNORMAL HIGH (ref 70–99)
Glucose-Capillary: 156 mg/dL — ABNORMAL HIGH (ref 70–99)
Glucose-Capillary: 180 mg/dL — ABNORMAL HIGH (ref 70–99)
Glucose-Capillary: 190 mg/dL — ABNORMAL HIGH (ref 70–99)
Glucose-Capillary: 199 mg/dL — ABNORMAL HIGH (ref 70–99)
Glucose-Capillary: 203 mg/dL — ABNORMAL HIGH (ref 70–99)

## 2019-10-23 LAB — BASIC METABOLIC PANEL
Anion gap: 10 (ref 5–15)
BUN: 24 mg/dL — ABNORMAL HIGH (ref 6–20)
CO2: 26 mmol/L (ref 22–32)
Calcium: 8.2 mg/dL — ABNORMAL LOW (ref 8.9–10.3)
Chloride: 101 mmol/L (ref 98–111)
Creatinine, Ser: 0.74 mg/dL (ref 0.61–1.24)
GFR calc Af Amer: >60 mL/min (ref >60)
GFR calc non Af Amer: >60 mL/min (ref >60)
Glucose, Bld: 197 mg/dL — ABNORMAL HIGH (ref 70–99)
Potassium: 4.1 mmol/L (ref 3.5–5.1)
Sodium: 137 mmol/L (ref 135–145)

## 2019-10-23 MED ORDER — AMIODARONE HCL 200 MG PO TABS
200.0000 mg | ORAL_TABLET | Freq: Two times a day (BID) | ORAL | Status: DC
  Administered 2019-10-23 – 2019-10-25 (×5): 200 mg via ORAL
  Filled 2019-10-23 (×5): qty 1

## 2019-10-23 NOTE — Progress Notes (Addendum)
     [address]       [place] [address]             [phone]       Doing well Tolerating diet  Vitals:   10/23/19 0400 10/23/19 0800  BP:  123/80  Pulse:  66  Resp:  17  Temp: 98.3 F (36.8 C) 98 F (36.7 C)  SpO2:  95%   Alert NAD Sinus EWOB  POD 5 s/p robotic assisted Ivor Lewis esophagectomy On diet Clear for discharge, but awaiting feeding pump delivery.  Likely won't occur until Monday. Will transition amio to crushed PO Will need to go home on crushed medications  Steven Crane O Ramsay Bognar

## 2019-10-24 LAB — GLUCOSE, CAPILLARY
Glucose-Capillary: 145 mg/dL — ABNORMAL HIGH (ref 70–99)
Glucose-Capillary: 192 mg/dL — ABNORMAL HIGH (ref 70–99)
Glucose-Capillary: 211 mg/dL — ABNORMAL HIGH (ref 70–99)
Glucose-Capillary: 240 mg/dL — ABNORMAL HIGH (ref 70–99)
Glucose-Capillary: 243 mg/dL — ABNORMAL HIGH (ref 70–99)
Glucose-Capillary: 256 mg/dL — ABNORMAL HIGH (ref 70–99)

## 2019-10-24 MED ORDER — INSULIN DETEMIR 100 UNIT/ML ~~LOC~~ SOLN
20.0000 [IU] | Freq: Two times a day (BID) | SUBCUTANEOUS | Status: DC
  Administered 2019-10-24 – 2019-10-25 (×3): 20 [IU] via SUBCUTANEOUS
  Filled 2019-10-24 (×4): qty 0.2

## 2019-10-24 NOTE — Progress Notes (Addendum)
      [address]       [place] [address]             [phone]       6 Days Post-Op Procedure(s) (LRB): XI ROBOTIC ASSISTED THORASCOPY-IVOR LEWIS ESOPHAGECTOMY (N/A) ESOPHAGOGASTRODUODENOSCOPY (EGD) (N/A) INTERCOSTAL NERVE BLOCK NODE DISSECTION XI ROBOTIC ASSISTED JEJUNOSTOMY TUBE PLACEMENT (Left) Subjective: Feeling stronger. Tolerating nocturnal feeding per J-tube and dysphagia diet. Denies nausea.  BM yesterday.  Objective: Vital signs in last 24 hours: Temp:  [97.9 F (36.6 C)-99.1 F (37.3 C)] 99.1 F (37.3 C) (07/04 0800) Pulse Rate:  [69-89] 81 (07/04 0800) Cardiac Rhythm: Normal sinus rhythm (07/04 0836) Resp:  [17-21] 20 (07/04 0800) BP: (122-149)/(75-84) 135/77 (07/04 0800) SpO2:  [96 %-100 %] 96 % (07/04 0800) Weight:  [114.7 kg] 114.7 kg (07/04 0620)     Intake/Output from previous day: 07/03 0701 - 07/04 0700 In: -  Out: 9147 [Urine:1570] Intake/Output this shift: No intake/output data recorded.  General appearance: alert, cooperative and no distress Neurologic: intact Heart: regular rate and rhythm Lungs: Breath sounds are clear. Good sats on RA.  Abdomen: soft, NT. J-tube site is intact and dry. Extremities: well perfused, minimal edema Wound: Has some serous drainage from the right chest tube site following removal of the tube yesterday.   Lab Results: Recent Labs    10/23/19 0326  WBC 7.7  HGB 8.7*  HCT 26.3*  PLT 182   BMET:  Recent Labs    10/23/19 0326  NA 137  K 4.1  CL 101  CO2 26  GLUCOSE 197*  BUN 24*  CREATININE 0.74  CALCIUM 8.2*    PT/INR: No results for input(s): LABPROT, INR in the last 72 hours. ABG    Component Value Date/Time   PHART 7.462 (H) 10/19/2019 0510   HCO3 23.7 10/19/2019 0510   TCO2 26 10/18/2019 1640   ACIDBASEDEF 3.9 (H) 10/18/2019 1809   O2SAT 98.4 10/19/2019 0510   CBG (last 3)  Recent Labs    10/24/19 0021 10/24/19 0426 10/24/19 0831  GLUCAP 256* 240* 243*     Assessment/Plan: S/P Procedure(s) (LRB): XI ROBOTIC ASSISTED THORASCOPY-IVOR LEWIS ESOPHAGECTOMY (N/A) ESOPHAGOGASTRODUODENOSCOPY (EGD) (N/A) INTERCOSTAL NERVE BLOCK NODE DISSECTION XI ROBOTIC ASSISTED JEJUNOSTOMY TUBE PLACEMENT (Left)  -POD6 robotic-assisted esophagectomy and J-tube placement. Swallow study on 7/2 showed widely patent anastomosis and no evidence of leak. Tolerating dysphagia diet.   -Post-op atrial fibrillation- loaded with IV amiodarone, converted to crushed amiodarone PO yesterday. Now in stable SR in 70-80's.  -Type 2 DM- Glucose now around 250. He was on levemir 70 units daily at home in addition to metformin. Will increase levemir to 20 units [place] BID (from 10 units daily as currently ordered). Continue SSI.   -Expected acute blood loss anemia- Hct drifted down, BP and HR stable. No indication for intervention. Recheck in AM.   -Pleural effusion- spontaneously drained from right CT site following tube removal yesterday. Will keep site clean and dry. Follow-up CXR in AM.   -Disposition- ready for discharge but awaiting confirmation that tube feeding supplies and pump will be available for home use.    LOS: 6 days    Antony Odea, PA-C 10/24/2019   Patient progressing well.  Surgical incisions clean and dry.  Will be ready for discharge home when all the equipment and support needed at home is in place.  patient examined and medical record reviewed,agree with above note. Tharon Aquas Trigt III 10/24/2019

## 2019-10-25 ENCOUNTER — Inpatient Hospital Stay (HOSPITAL_COMMUNITY): Payer: BC Managed Care – PPO

## 2019-10-25 LAB — CBC
HCT: 27.2 % — ABNORMAL LOW (ref 39.0–52.0)
Hemoglobin: 9.1 g/dL — ABNORMAL LOW (ref 13.0–17.0)
MCH: 31 pg (ref 26.0–34.0)
MCHC: 33.5 g/dL (ref 30.0–36.0)
MCV: 92.5 fL (ref 80.0–100.0)
Platelets: 202 10*3/uL (ref 150–400)
RBC: 2.94 MIL/uL — ABNORMAL LOW (ref 4.22–5.81)
RDW: 15.4 % (ref 11.5–15.5)
WBC: 8.8 10*3/uL (ref 4.0–10.5)
nRBC: 0 % (ref 0.0–0.2)

## 2019-10-25 LAB — BASIC METABOLIC PANEL
Anion gap: 8 (ref 5–15)
BUN: 19 mg/dL (ref 6–20)
CO2: 22 mmol/L (ref 22–32)
Calcium: 7.7 mg/dL — ABNORMAL LOW (ref 8.9–10.3)
Chloride: 99 mmol/L (ref 98–111)
Creatinine, Ser: 0.7 mg/dL (ref 0.61–1.24)
GFR calc Af Amer: >60 mL/min (ref >60)
GFR calc non Af Amer: >60 mL/min (ref >60)
Glucose, Bld: 221 mg/dL — ABNORMAL HIGH (ref 70–99)
Potassium: 4.6 mmol/L (ref 3.5–5.1)
Sodium: 129 mmol/L — ABNORMAL LOW (ref 135–145)

## 2019-10-25 LAB — GLUCOSE, CAPILLARY
Glucose-Capillary: 197 mg/dL — ABNORMAL HIGH (ref 70–99)
Glucose-Capillary: 218 mg/dL — ABNORMAL HIGH (ref 70–99)
Glucose-Capillary: 225 mg/dL — ABNORMAL HIGH (ref 70–99)
Glucose-Capillary: 73 mg/dL (ref 70–99)

## 2019-10-25 MED ORDER — AMIODARONE HCL 200 MG PO TABS: 200.0000 mg | ORAL_TABLET | Freq: Two times a day (BID) | ORAL | 1 refills | Status: DC

## 2019-10-25 MED ORDER — OSMOLITE 1.5 CAL PO LIQD: 1610.0000 mL | ORAL | 0 refills | Status: DC

## 2019-10-25 MED ORDER — ACETAMINOPHEN 160 MG/5ML PO SOLN: 650.0000 mg | Freq: Four times a day (QID) | ORAL | 0 refills | Status: DC | PRN

## 2019-10-25 NOTE — Discharge Instructions (Signed)
Esophageal Surgery   Patient and Family Education YOUR POST-ESOPHAGECTOMY CHECKLIST    Dear Patient and Family Member(s): The purpose of this handout  is to help answer your questions about esophageal surgery.   Please take the time to look through this information and share with your family.   We suggest you write down any questions you have to ask your doctor and healthcare team.  This booklet also contains information from your surgeon's office, including general information, insurance information, and directions to the office.  It is important that you or your family keep this  The nurses and doctors will refer to this information, especially as you prepare to go home!  Please ask the staff any questions you may have!   YOUR POST-ESOPHAGECTOMY CHECKLIST The purpose of this checklist is to make sure you will be ready to go home after surgery and have some items arranged BEFORE go home , so you have a smooth discharge from the hospital after your surgery.  o Quit smoking and avoid alcohol  o Do you have your home medicine list including pain medicine and understand it and are the prescriptions signed before you go home?  o Do you understand your diet instructions?   o Do you understand how to care for your Feeding tube, flushing and starting tube feeding from pump ?  o Do NOT take any non-steroidal anti-inflammatory drugs (for example, Motrin, ibuprofen, Aleve)  o Arrange for someone to be at home with you for 24/7 for the first week after discharge from the hospital. This person needs to be able to help you get in and out of bed, prepare food, change dressings, and start tube feedings if needed.  o Get a 12-inch wedge or 12-inch bed lifts to raise the head of the bed after you go home. Sleeping with your head elevated will help prevent reflux.   o Have recommended foods at home for when you are discharged.   DIET INFORMATION Important points to keep in mind You should  eat 6-8 small meals each day.  Large meals will not be well tolerated. Avoid very hot or cold beverages and spicy foods.  Protein supplements, high-energy foods, or a soft diet may be ordered.  Drinking fluids between meals rather than with meals may also be helpful.    A clinical dietician will be involved in your care, who will help in planning your meals.  You should sit upright, chew slowly, and eat more than 3 hours before bedtime to reduce reflux.   1.   Your body needs added calories and protein to help heal itself. Start slowly and gradually eat more as you are able. 2.   Eat small, frequent meals at least six times per day. 3.   Everyone tolerates foods differently.  Avoid those foods known to cause you problems. 4.   Keep high-calorie snacks handy, such as cheese, peanut butter, and yogurt. 5.   Increase your protein in foods by adding shredded cheese, dry milk powder, or peanut butter. 6.   Drink only nutritious beverages. Try unsweetened juice, Ensure, Boost or Carnation Instant Breakfast instead of coffee, tea, soda, or water. 7.   Do not lay down immediately after eating. Stay upright for at least 2 hours.  Dumping Syndrome When food or fluids move too quickly through your digestive system, it is called "dumping syndrome." Symptoms of dumping syndrome include nausea, dizziness or light-headedness, weakness and fatigue, rapid pulse, abdominal cramping, and diarrhea. It is essential to  tell your doctor if you have any of these symptoms.  Here are some tips to help avoid dumping syndrome: 1.  Don't drink liquids with your meals.  Wait 30 minutes to 1 hour after eating solid food to drink something. 2.  Limit sweets. Use sugar-free foods and drinks in place of regular sweet foods or drinks.   3.  Lactose (milk sugar) may also cause diarrhea and cramping. Drink lactose-free or lactose-reduced milk, or take lactase enzyme tablets (like Dairy Ease) when you eat dairy products. 4.   Adding extra fats (like butter, margarine, cheese, gravy, cream, or sour cream) to foods may help slow down the movement of food through your system. 5.  Avoid extremely hot or cold foods. 6.  Avoid carbonated drinks and alcohol. 7.  Eat slowly and chew your food carefully.  DISCHARGE INSTRUCTIONS . Take a few minutes each day to inspect your surgical incision for any signs or symptoms of infection or other complications (increased pain, swelling, fever, drainage, saliva leaking at the incision site). Report any problems to your surgeon immediately. See your doctor right away if you experience any difficulty swallowing. . If you smoked, do not go back to smoking!! Do not be in a room or a car with someone else who is smoking. Smoking and tobacco use can make you not heal as well. [place] offers FREE smoking cessation classes that can help you quit smoking. For information on classes, and to register, call [phone]. Marland Kitchen Shower every day. Wash the incision gently with a mild unscented soap. Use a clean washcloth each time you wash. Pat your incision dry. Do not use lotions, creams, ointments, powders, or home remedies on or near your incisions. Your incision may be numb or sore for several weeks or months after your surgery. . Avoid heavy activity or anything that tenses your body for 12 weeks after surgery. You may resume your daily activities, work, and sexual relations as soon as you feel able to do so. No driving for the first 3 weeks after going home. . Weigh yourself several times a week. Report any significant weight changes to your doctor (10 pounds in 2 weeks). . Try not to take pain relievers longer than 4 to 7 days. Talk with your doctor if you continue to have pain that requires pain medication after a few days.  . To prevent constipation, take stool softeners at least as long as you take pain medication. If you are sent home with antibiotics, please take all of them even if you  feel fine. . Call your doctor if any of the following occur: fever of 101 degrees or higher, increased pain, swelling, redness, draining, or bleeding around your incision; vomiting; excessive weakness; tarry (black) stools; new, unexplained symptoms (they may be adverse reactions to drugs used in treatment); progressive weight loss; or continuous diarrhea. . Call the Surgeons office immediately if your feeding tube becomes clogged or falls out. Marland Kitchen Keep follow-up appointments so that your physician can check on your progress and condition.  Bring your medicine you are currently taking with you to your appointment. . The [place], survival support groups, social workers, chaplains, counselors, and smoking cessation programs may be helpful. --   All tablet medications must be crushed and taken with applesauce or food of similar consistency.      [facility] [address] [place], [place]  [address]  At [facility] we are dedicated to serving our patients.  We are committed to caring for you in the most competent and courteous manner possible.  We hope that the following information will be helpful.  If you have any questions, please do not hesitate to ask.  OFFICE HOURS  Our office hours are 9:00AM to 5:00PM, Monday through Friday.  Our patients are seen by appointment only. If you are having any problems, we encourage you to call during office hours. In case of an emergency, one of our surgeons is on call at all times. If the office is closed, call the main switchboard and hold until the answering service responds. Your call will be returned as soon as possible.  Telephone Numbers Main Number:  [phone] For Appointments: [phone] Fax Number: [phone]

## 2019-10-25 NOTE — TOC Transition Note (Signed)
Transition of Care (TOC) - CM/SW Discharge Note Marvetta Gibbons RN, BSN Transitions of Care Unit 4E- RN Case Manager See Treatment Team for direct phone #    Patient Details  Name: Steven Crane MRN: [id number] Date of Birth: 20-Sep-1960  Transition of [facility]) CM/SW Contact:  Dawayne Patricia, RN Phone Number: 10/25/2019, 12:11 PM   Clinical Narrative:    Pt stable for transition home today, home TF form signed this am and faxed back to [employer]. Have confirmed with [employer] that home TF will be delivered today to the home. [employer] will provide education on how to use the pump and provide tubing and everything needed for night time feedings.  Spoke with pt again regarding [facility]- pt would feel better if [facility] could be arranged to f/u on home TF needs and educations. Per [place] with [facility] there are no other HHRN needs other than TF/Jtube education.  Calls made to multiple [place]- unable to accept [facility]- unable to accept [facility]- able to accept and will be able to do start of care within 48 hr of discharge. - they will reach out to pt to set up visit.   F/u done with pt to let him know HH was able to be arranged for [facility] follow up visit post discharge.  Pt very appreciative. Pt states Home TF have been delivered. Bedside RN notifying PA for discharge orders.    Final next level of care: [place] Barriers to Discharge: Barriers Resolved   Patient Goals and CMS Choice Patient states their goals for this hospitalization and ongoing recovery are:: return home CMS Medicare.gov Compare Post Acute Care list provided to:: Patient Choice offered to / list presented to : Patient  Discharge Placement               Home with [facility]        Discharge Plan and Services   Discharge Planning Services: CM Consult Post Acute Care Choice: Durable Medical Equipment          DME Arranged: Tube feeding, Tube feeding pump DME Agency: [employer] Date DME Agency Contacted:  10/22/19 Time DME Agency Contacted: 1400 Representative spoke with at DME Agency: [place]: RN [place] Agency: [place] Date [place]: 10/25/19 Time [place]: 1211 Representative spoke with at [place]: [place]) Interventions     Readmission Risk Interventions Readmission Risk Prevention Plan 10/25/2019  Transportation Screening Complete  PCP or Specialist Appt within 5-7 Days Complete  Home Care Screening Complete  Medication Review (RN CM) Complete  Some recent data might be hidden

## 2019-10-25 NOTE — Progress Notes (Signed)
Pt discharged home with family. IV and telemetry box removed. Pt received discharge instructions and all questions were answered. Pt left with all of his belongings. Pt discharged via wheelchair and was accompanied by this RN.

## 2019-10-25 NOTE — Progress Notes (Addendum)
[address]       [place] [address]             [phone]       7 Days Post-Op Procedure(s) (LRB): XI ROBOTIC ASSISTED THORASCOPY-IVOR LEWIS ESOPHAGECTOMY (N/A) ESOPHAGOGASTRODUODENOSCOPY (EGD) (N/A) INTERCOSTAL NERVE BLOCK NODE DISSECTION XI ROBOTIC ASSISTED JEJUNOSTOMY TUBE PLACEMENT (Left) Subjective: Just finished breakfast. Says he is having no difficulty swallowing puree foods but has some discomfort with swallowing thin liquids. Continues to tolerate nocturnal TF via J-tube.    Objective: Vital signs in last 24 hours: Temp:  [97.4 F (36.3 C)-99.7 F (37.6 C)] 98 F (36.7 C) (07/05 0806) Pulse Rate:  [74-93] 81 (07/05 0806) Cardiac Rhythm: Normal sinus rhythm;Heart block (07/05 0721) Resp:  [17-22] 17 (07/05 0806) BP: (119-137)/(76-80) 119/78 (07/05 0806) SpO2:  [96 %-100 %] 100 % (07/05 0806)     Intake/Output from previous day: 07/04 0701 - 07/05 0700 In: -  Out: 1500 [Urine:1500] Intake/Output this shift: No intake/output data recorded.  General appearance: alert, cooperative and no distress Neurologic: intact Heart: regular rate and rhythm Lungs: Breath sounds are clear. Good sats on RA.  Abdomen: soft, NT. J-tube site is intact and dry. Extremities: well perfused, minimal edema Wound: all incisions are dry. No further drainage from the right chest tube site.  He has some induration and erythema at an old IV site at his left forearm. No drainage at the site.   Lab Results: Recent Labs    10/23/19 0326 10/25/19 0414  WBC 7.7 8.8  HGB 8.7* 9.1*  HCT 26.3* 27.2*  PLT 182 202   BMET:  Recent Labs    10/23/19 0326 10/25/19 0414  NA 137 129*  K 4.1 4.6  CL 101 99  CO2 26 22  GLUCOSE 197* 221*  BUN 24* 19  CREATININE 0.74 0.70  CALCIUM 8.2* 7.7*    PT/INR: No results for input(s): LABPROT, INR in the last 72 hours. ABG    Component Value Date/Time   PHART 7.462 (H) 10/19/2019 0510   HCO3 23.7 10/19/2019 0510     TCO2 26 10/18/2019 1640   ACIDBASEDEF 3.9 (H) 10/18/2019 1809   O2SAT 98.4 10/19/2019 0510   CBG (last 3)  Recent Labs    10/25/19 0005 10/25/19 0423 10/25/19 0758  GLUCAP 225* 218* 30    Assessment/Plan: S/P Procedure(s) (LRB): XI ROBOTIC ASSISTED THORASCOPY-IVOR LEWIS ESOPHAGECTOMY (N/A) ESOPHAGOGASTRODUODENOSCOPY (EGD) (N/A) INTERCOSTAL NERVE BLOCK NODE DISSECTION XI ROBOTIC ASSISTED JEJUNOSTOMY TUBE PLACEMENT (Left)  -POD7 robotic-assisted esophagectomy and J-tube placement. Swallow study on 7/2 showed widely patent anastomosis and no evidence of leak. Tolerating dysphagia diet.   -Post-op atrial fibrillation- loaded with IV amiodarone, converted to crushed amiodarone PO 7/3. Now in stable SR in 70-80's.  -Phlebitis- at old IV site left forearm. Will tx with warm compresses for now. Recheck WBC in AM. Monitor.   -Type 2 DM- Glucose 130-225 past 24 hours. He was on levemir 70 units daily at home in addition to metformin. Will continue levemir to 20 units [place] BID Continue SSI.   -Expected acute blood loss anemia- Hct trending up.   -Pleural effusion- spontaneously drained from right CT site following tube removal yesterday. The site is now  clean and dry. Follow-up CXR this AM shows no residual fluid on right, small effusion on left, lung fields o/w clear. .   -Disposition- ready for discharge but awaiting confirmation that tube feeding supplies and pump will be available for home  use. Not expected to be available before tomorrow due to holiday weekend.    LOS: 7 days    Malon Kindle [phone] 10/25/2019   Agree with above Doing well Discharge ready, awaiting home supplies  [place]

## 2019-10-26 ENCOUNTER — Telehealth: Payer: Self-pay

## 2019-10-26 ENCOUNTER — Encounter (HOSPITAL_COMMUNITY): Payer: Self-pay | Admitting: Thoracic Surgery (Cardiothoracic Vascular Surgery)

## 2019-10-26 DIAGNOSIS — Z483 Aftercare following surgery for neoplasm: Secondary | ICD-10-CM | POA: Diagnosis not present

## 2019-10-26 DIAGNOSIS — C155 Malignant neoplasm of lower third of esophagus: Secondary | ICD-10-CM

## 2019-10-26 NOTE — Telephone Encounter (Signed)
Pt called office this morning to clarify tube feeding orders. He is s/p robotic-assisted esophagectomy on 10/18/19 by Dr. Kipp Brood. Pt reports that Dr. Kipp Brood told him the Osmolite tube feeding could be given via pump over 14 hours during the evening/night, but [facility] RN told him they were not informed of this and that the Osmolite 1.5 cal should run continuously for a total of 1610 ml daily. Informed Bettie, RN with [facility] that pt was receiving tube feeding from 1800 to 0800 while in the hospital and that it is okay to administer this way. Tube feeding to be given at rate of 115 ml/hr from 1800 to 0600 daily. RN reported that pt does not like the taste of some of his medications when they are crushed in applesauce and has been swallowing pills whole. She is wanting to know if it is okay for him to continue doing this. Per d/c summary, advised that pt should be crushing medications and taking w/ applesauce or another food similar in consistency. TC to inform pt; left VM stating that he should not be taking medications whole--to crush and take with soft food. Pt to f/u with Dr. Kipp Brood 10/29/19.

## 2019-10-27 ENCOUNTER — Ambulatory Visit: Payer: BC Managed Care – PPO | Admitting: Urology

## 2019-10-29 ENCOUNTER — Other Ambulatory Visit: Payer: Self-pay

## 2019-10-29 ENCOUNTER — Other Ambulatory Visit: Payer: Self-pay | Admitting: Thoracic Surgery (Cardiothoracic Vascular Surgery)

## 2019-10-29 ENCOUNTER — Ambulatory Visit (INDEPENDENT_AMBULATORY_CARE_PROVIDER_SITE_OTHER): Payer: Self-pay | Admitting: Thoracic Surgery (Cardiothoracic Vascular Surgery)

## 2019-10-29 ENCOUNTER — Other Ambulatory Visit (HOSPITAL_COMMUNITY): Payer: Self-pay | Admitting: Internal Medicine

## 2019-10-29 ENCOUNTER — Ambulatory Visit (HOSPITAL_COMMUNITY)
Admission: RE | Admit: 2019-10-29 | Discharge: 2019-10-29 | Disposition: A | Discharge status: Home / Self Care | Payer: BC Managed Care – PPO | Source: Ambulatory Visit | Attending: Thoracic Surgery (Cardiothoracic Vascular Surgery) | Admitting: Thoracic Surgery (Cardiothoracic Vascular Surgery)

## 2019-10-29 ENCOUNTER — Encounter: Payer: Self-pay | Admitting: Thoracic Surgery (Cardiothoracic Vascular Surgery)

## 2019-10-29 ENCOUNTER — Other Ambulatory Visit (HOSPITAL_COMMUNITY): Payer: Self-pay | Admitting: Thoracic Surgery (Cardiothoracic Vascular Surgery)

## 2019-10-29 VITALS — BP 137/82 | HR 64 | Temp 96.4°F | Resp 24 | Ht 74.0 in | Wt 254.0 lb

## 2019-10-29 DIAGNOSIS — R079 Chest pain, unspecified: Secondary | ICD-10-CM | POA: Diagnosis present

## 2019-10-29 DIAGNOSIS — I499 Cardiac arrhythmia, unspecified: Secondary | ICD-10-CM | POA: Diagnosis not present

## 2019-10-29 DIAGNOSIS — C159 Malignant neoplasm of esophagus, unspecified: Secondary | ICD-10-CM

## 2019-10-29 MED ORDER — AMIODARONE HCL 200 MG PO TABS: 400.0000 mg | ORAL_TABLET | Freq: Two times a day (BID) | ORAL | 1 refills | Status: DC

## 2019-10-29 MED ORDER — METOCLOPRAMIDE HCL 5 MG/5ML PO SOLN: 5.0000 mg | Freq: Three times a day (TID) | ORAL | 0 refills | Status: DC

## 2019-10-29 MED ORDER — TRAMADOL 5 MG/ML ORAL SUSPENSION: 25.0000 mg | Freq: Four times a day (QID) | ORAL | 0 refills | Status: DC | PRN

## 2019-10-29 NOTE — Progress Notes (Unsigned)
ekg 

## 2019-10-29 NOTE — Progress Notes (Signed)
      [address]       [place] [address]             [phone]        Tremel R Smead McCormick Medical Record #[id number] Date of Birth: 31-Jul-1960  Referring: Steven Jack, MD Primary Care: Steven Colla, DO Primary Cardiologist:Steven Bronson Ing, MD  Reason for visit:   follow-up  History of Present Illness:     Mr. Steven Crane comes in for his 1 week follow-up appointment.  He complains of some fullness with the tube feeds as well as some soreness in his right chest.  He has had some drainage from the IV site in his right arm.  He has tolerated an oral diet, as well as medications without issues.  Physical Exam: BP 137/82   Pulse 64 Comment: 58-77 bpm, irregular  Temp (!) 96.4 F (35.8 C) (Temporal)   Resp (!) 24   Ht 6\' 2"  (1.88 m)   Wt 254 lb (115.2 kg)   SpO2 99% Comment: RA  BMI 32.61 kg/m   Alert NAD Incision clean.  J-tube site is clean as well. Abdomen soft, ND No peripheral edema Induration along his right forearm with mild drainage.  Diagnostic Studies & Laboratory data:  Path:  A. LYMPH NODE, CELIAC, EXCISION:  - Benign fibroadipose tissue  - Lymph node is not identified   B. LYMPH NODE, LEVEL 7, EXCISION:  - Lymph node, negative for carcinoma (0/1)   C. LYMPH NODE, LEVEL 7, #2, EXCISION:  - Lymph node, negative for carcinoma (0/1)   D. ESOPHAGUS, PROXIMAL MARGIN, RESECTION:  - Negative for carcinoma   E. ESOPHAGUS, DISTAL MARGIN, RESECTION:  - Negative for carcinoma   F. ESOPHAGOGASTRECTOMY, RESECTION:  - No evidence of residual carcinoma (complete therapeutic response).  See comment  - Background stomach with nonspecific gastritis and ulceration  - Therapy-related changes with scattered acellular mucin pools within  subserosa  - Ten benign lymph nodes (0/10)  - Pathologic stage: ypT0, ypN0     Assessment / Plan:   59 year old male status post robotic assisted Ivor Lewis esophagectomy.  He was treated with  neoadjuvant chemotherapy prior to surgery and had a good pathologic response on his final pathology.  He is currently doing well.  Recommended that he advance his diet to a soft mechanical diet, that we discontinue his tube feeds right now.  I have given him a prescription of Keflex for his left arm thrombophlebitis.  He also appears to be in atrial fibrillation and have ordered an EKG.  In the meantime I will increase his amiodarone back to 400 mg twice daily.  The EKG does show normal sinus rhythm with occasional PACs.  He is also received a prescription for Reglan, and tramadol solution.  I will see him back in clinic in 2 weeks to assess removal of his J-tube.    Steven Crane 10/29/2019 12:39 PM

## 2019-11-01 ENCOUNTER — Telehealth: Payer: Self-pay

## 2019-11-01 NOTE — Telephone Encounter (Signed)
Pt calls office and reports that he has noticed some yellowish-green drainage around his J-tube x 2 days. He is afebrile and denies redness around the site. TC to [place], who says that RN visit could be made this afternoon. Received a call later this afternoon from [facility] [place] [place], who visited pt today. She reports a scant amount of yellowish-green drainage around pt's J-tube, but states there are no s/s of infection otherwise. RN reports that pt told her he is changing the dressing qod. Advised that he should be changing daily. TC to pt to advise to change J-tube dressing at least once per day and to monitor drainage. If drainage amount increases, pt is to notify home health RN or [facility]. Also advised pt to regularly check his temperature and to report fever or other s/s of infection. Pt is scheduled for f/u with Dr. Kipp Brood on 11/19/19.

## 2019-11-02 ENCOUNTER — Ambulatory Visit: Payer: BC Managed Care – PPO | Admitting: Family Medicine

## 2019-11-02 ENCOUNTER — Encounter: Payer: Self-pay | Admitting: Family Medicine

## 2019-11-02 ENCOUNTER — Other Ambulatory Visit: Payer: Self-pay

## 2019-11-02 VITALS — BP 142/80 | HR 83 | Temp 97.3°F | Ht 74.0 in | Wt 248.0 lb

## 2019-11-02 DIAGNOSIS — Z934 Other artificial openings of gastrointestinal tract status: Secondary | ICD-10-CM

## 2019-11-02 DIAGNOSIS — R300 Dysuria: Secondary | ICD-10-CM | POA: Diagnosis not present

## 2019-11-02 DIAGNOSIS — Z09 Encounter for follow-up examination after completed treatment for conditions other than malignant neoplasm: Secondary | ICD-10-CM

## 2019-11-02 DIAGNOSIS — E119 Type 2 diabetes mellitus without complications: Secondary | ICD-10-CM

## 2019-11-02 DIAGNOSIS — C159 Malignant neoplasm of esophagus, unspecified: Secondary | ICD-10-CM

## 2019-11-02 LAB — POCT URINALYSIS DIPSTICK
Spec Grav, UA: <=1.005 — AB (ref 1.010–1.025)
pH, UA: 5 (ref 5.0–8.0)

## 2019-11-02 MED ORDER — INSULIN DETEMIR 100 UNIT/ML ~~LOC~~ SOLN: 50.0000 [IU] | Freq: Every day | SUBCUTANEOUS | 1 refills | Status: DC

## 2019-11-02 MED ORDER — TRAMADOL 5 MG/ML ORAL SUSPENSION: 25.0000 mg | Freq: Four times a day (QID) | ORAL | 0 refills | Status: DC | PRN

## 2019-11-02 NOTE — Patient Instructions (Signed)
levimir- 50 units at night if blood sugar over 150.  Continue with metformin 500mg  1 tab in am and pm with meals.   Check blood glucose 2 xper day call if seeing numbers under 80 or over 180.

## 2019-11-02 NOTE — Progress Notes (Signed)
Patient ID: Steven Crane, male    DOB: [birth year], 59 y.o.   MRN: [id number]   Chief Complaint  Patient presents with  . Urinary Frequency   Subjective:    HPI  Pt f/u from hospital discharge after surgery for esophageal cancer resection.  Pt seen with daughter in law today.  Pt admitted on 10/18/19 and dc on 10/25/19.  Pt did well with surgery with Dr. Kipp Brood, cardiothoracic surgeon.  Pt dc with a J-tube for tube feedings.  Pt noting some burning with urination, odor to urine and frequency for about one week. Pt stating had urine catheter placed while in hospital. Was in place a few days while in hospital per pt.  Blood glucose 283 last night. Pt states he was taking metformin 2 bid but sugar was dropping low and pt went back to one bid. Started having problems with sugar since taking out feeding tube. At discharge from hospital pt dc with metformin 1077m bid with meals and crush and put in J-tube.  Drinking good amt of water.  Seeing surgeon on 11/19/19. J- tube in place.   Pt wanting letter of competency-  Has 2 sons(one biological son and step son), they are the medical POA.   Lawyer asked for paper stating "pt is of sound mind." Pay all own bills,  Some family living with you now. Has girlfriend at home and taking care of her medically. Never had a will before. Daughter in law - known him for 10 yrs.  Make sense seems to be of sound mind.   [id number]attorney, [phone] [id number]   TBaird [phone]-pt's cousin. Fax-[phone] Results for orders placed or performed in visit on 11/02/19  Urine Culture   Specimen: Urine   Urine  Result Value Ref Range   Urine Culture, Routine Final report (A)    Organism ID, Bacteria Klebsiella pneumoniae (A)    Antimicrobial Susceptibility Comment   Specimen status report  Result Value Ref Range   specimen status report Comment   POCT urinalysis dipstick  Result Value Ref Range   Color,  UA     Clarity, UA     Glucose, UA     Bilirubin, UA     Ketones, UA     Spec Grav, UA <=1.005 (A) 1.010 - 1.025   Blood, UA     pH, UA 5.0 5.0 - 8.0   Protein, UA     Urobilinogen, UA     Nitrite, UA     Leukocytes, UA     Appearance     Odor       Medical History BTerellhas a past medical history of Anxiety, Arthritis, Asthma, CHF (congestive heart failure) ([facility] (02[place]), Chronic bronchitis (HCC), Chronic lower back pain, Closed head injury ((8527, Coronary artery disease (May 2015), Depression, DVT (deep venous thrombosis) ([facility] (2008), Esophageal cancer ([facility], GERD (gastroesophageal reflux disease), Headache, History of kidney stones, Hyperlipemia, Hypertension, Hypertriglyceridemia, LV dysfunction, Morbid obesity ([facility], Myocardial infarction ([facility] (02[facility]2010), Psoriasis, and Type II diabetes mellitus ([facility].   Outpatient Encounter Medications as of 11/02/2019  Medication Sig  . acetaminophen (TYLENOL) 160 MG/5ML solution Place 20.3 mLs (650 mg total) into feeding tube every 6 (six) hours as needed for moderate pain.  .Marland Kitchenalbuterol (PROVENTIL HFA;VENTOLIN HFA) 108 (90 Base) MCG/ACT inhaler Inhale 2 puffs into the lungs every 6 (six) hours as needed for wheezing or shortness of breath.  . ALPRAZolam (XANAX) 0.5 MG  tablet TAKE ONE-HALF TO ONE TABLET BY MOUTH ONCE DAILY AS NEEDED (Patient taking differently: Take 0.5-75 mg by mouth daily as needed for anxiety. )  . amiodarone (PACERONE) 200 MG tablet Take 2 tablets (400 mg total) by mouth 2 (two) times daily. Must be crushed.  Marland Kitchen aspirin EC 81 MG tablet Take 1 tablet (81 mg total) by mouth daily. (Patient taking differently: Take 81 mg by mouth every evening. )  . atorvastatin (LIPITOR) 40 MG tablet Take 1 tablet by mouth once daily (Patient taking differently: Take 40 mg by mouth at bedtime. )  . blood glucose meter kit and supplies Test glucose once daily. ICD 10 E11.9  . calcium carbonate (TUMS - DOSED IN MG ELEMENTAL CALCIUM) 500 MG  chewable tablet Chew 2 tablets by mouth 2 (two) times daily as needed for indigestion or heartburn.  . insulin detemir (LEVEMIR) 100 UNIT/ML injection Inject 0.5 mLs (50 Units total) into the skin at bedtime.  . lidocaine-prilocaine (EMLA) cream Apply a small amount to port a cath site and cover with plastic wrap one hour prior to infusion appointments (Patient taking differently: Apply 1 application topically See admin instructions. Apply a small amount to port a cath site and cover with plastic wrap one hour prior to infusion appointments)  . metFORMIN (GLUCOPHAGE) 500 MG tablet 2 tablets (1,000 mg total) by Per J Tube route 2 (two) times daily with a meal. Crush and put medication in J-tube  . [DISCONTINUED] insulin detemir (LEVEMIR) 100 UNIT/ML injection Inject 70 Units into the skin daily as needed (in the morning if blood sugar is >150).  . [DISCONTINUED] Lactulose 20 GM/30ML SOLN Take 30 mLs (20 g total) by mouth at bedtime. May increase to twice a day if needed. (Patient taking differently: Take 30 mLs by mouth at bedtime. )  . [DISCONTINUED] metoCLOPramide (REGLAN) 5 MG/5ML solution Take 5 mLs (5 mg total) by mouth 4 (four) times daily -  before meals and at bedtime.  . [DISCONTINUED] ondansetron (ZOFRAN ODT) 8 MG disintegrating tablet Take 1 tablet (8 mg total) by mouth every 8 (eight) hours as needed for nausea or vomiting.  . [DISCONTINUED] pentoxifylline (TRENTAL) 400 MG CR tablet TAKE 1 TABLET BY MOUTH THREE TIMES DAILY WITH MEALS  . [DISCONTINUED] prochlorperazine (COMPAZINE) 10 MG tablet Take 1 tablet (10 mg total) by mouth every 6 (six) hours as needed for nausea or vomiting.  . ciprofloxacin (CIPRO) 250 MG tablet Take 1 tablet (250 mg total) by mouth 2 (two) times daily.  . traMADol (ULTRAM) 5 mg/mL SUSP Take 5 mLs (25 mg total) by mouth every 6 (six) hours as needed.  . [DISCONTINUED] lidocaine (XYLOCAINE) 2 % solution Use as directed 15 mLs in the mouth or throat as needed for mouth  pain. (Patient taking differently: Use as directed 15 mLs in the mouth or throat as needed for mouth pain. Mouth pain)  . [DISCONTINUED] Multiple Vitamin (MULTIVITAMIN WITH MINERALS) TABS tablet Take 1 tablet by mouth every evening.   . [DISCONTINUED] Nutritional Supplements (FEEDING SUPPLEMENT, OSMOLITE 1.5 CAL,) LIQD Place 1,610 mLs into feeding tube daily.  . [DISCONTINUED] traMADol (ULTRAM) 5 mg/mL SUSP Take 5 mLs (25 mg total) by mouth every 6 (six) hours as needed for up to 13 days.   No facility-administered encounter medications on file as of 11/02/2019.     Review of Systems  Constitutional: Negative for chills and fever.  HENT: Negative for congestion, rhinorrhea and sore throat.   Respiratory: Negative for cough, shortness  of breath and wheezing.   Cardiovascular: Negative for chest pain and leg swelling.  Gastrointestinal: Negative for abdominal pain, diarrhea, nausea and vomiting.  Genitourinary: Positive for dysuria. Negative for difficulty urinating, frequency, hematuria and urgency.  Skin: Negative for rash.  Neurological: Negative for dizziness, weakness and headaches.     Vitals BP (!) 142/80   Pulse 83   Temp (!) 97.3 F (36.3 C)   Ht '6\' 2"'  (1.88 m)   Wt 248 lb (112.5 kg)   SpO2 98%   BMI 31.84 kg/m   Objective:   Physical Exam Vitals reviewed.  Constitutional:      Appearance: Normal appearance.  HENT:     Head: Normocephalic.     Nose: Nose normal. No congestion.     Mouth/Throat:     Mouth: Mucous membranes are moist.     Pharynx: No oropharyngeal exudate.  Eyes:     Extraocular Movements: Extraocular movements intact.     Conjunctiva/sclera: Conjunctivae normal.     Pupils: Pupils are equal, round, and reactive to light.  Cardiovascular:     Rate and Rhythm: Normal rate and regular rhythm.     Pulses: Normal pulses.     Heart sounds: Normal heart sounds. No murmur heard.   Pulmonary:     Effort: Pulmonary effort is normal.     Breath  sounds: Normal breath sounds. No wheezing, rhonchi or rales.  Abdominal:     General: Abdomen is flat. Bowel sounds are normal. There is distension.     Palpations: Abdomen is soft. There is no mass.     Tenderness: There is abdominal tenderness. There is no guarding or rebound.     Hernia: No hernia is present.     Comments: +J-tube in place, small yellow discharge from the incision. All other incisions from surgery are healing well.  Musculoskeletal:        General: Normal range of motion.     Right lower leg: No edema.     Left lower leg: No edema.  Skin:    General: Skin is warm and dry.     Findings: No rash.  Neurological:     General: No focal deficit present.     Mental Status: He is alert and oriented to person, place, and time.  Psychiatric:        Mood and Affect: Mood normal.        Behavior: Behavior normal.        Thought Content: Thought content normal.        Judgment: Judgment normal.      Assessment and Plan   1. Hospital discharge follow-up  2. Dysuria - POCT urinalysis dipstick - Urine Culture - Specimen status report - ciprofloxacin (CIPRO) 250 MG tablet; Take 1 tablet (250 mg total) by mouth 2 (two) times daily.  Dispense: 6 tablet; Refill: 0  3. Malignant neoplasm of esophagus, unspecified location (HCC) - traMADol (ULTRAM) 5 mg/mL SUSP; Take 5 mLs (25 mg total) by mouth every 6 (six) hours as needed.  Dispense: 250 mL; Refill: 0  4. Jejunostomy tube present ([place])  5. Diabetes mellitus without complication (HCC) - insulin detemir (LEVEMIR) 100 UNIT/ML injection; Inject 0.5 mLs (50 Units total) into the skin at bedtime.  Dispense: 10 mL; Refill: 1   Send urine for culture. Increase in drinking water. Pt to f/u with cardiothoracic surgery and with oncology. Pt seeing GI to get J-tube removed next week. Continue to use J-tube as directed.  Will  write letter for pt and fax to his attorney about competence and pt able to make legal, financial, and  medical decisions.  DM2- changed levemir to 50 units qhs.  decrease with metformin to 1 tab bid with meals.  F/u 2 months.  Addendum- pt urine culture grew out bacteria will give cirpo 256m bid for 3 days.  Sensitivities to antibiotics still pending.

## 2019-11-04 ENCOUNTER — Other Ambulatory Visit: Payer: Self-pay | Admitting: Thoracic Surgery (Cardiothoracic Vascular Surgery)

## 2019-11-05 MED ORDER — CIPROFLOXACIN HCL 250 MG PO TABS: 250.0000 mg | ORAL_TABLET | Freq: Two times a day (BID) | ORAL | 0 refills | Status: DC

## 2019-11-07 ENCOUNTER — Encounter: Payer: Self-pay | Admitting: Family Medicine

## 2019-11-07 LAB — SPECIMEN STATUS REPORT

## 2019-11-07 LAB — URINE CULTURE

## 2019-11-07 MED ORDER — METFORMIN HCL 500 MG PO TABS
500.0000 mg | ORAL_TABLET | Freq: Two times a day (BID) | ORAL | 3 refills | Status: DC
Start: 2019-11-07 — End: 2019-11-23

## 2019-11-09 ENCOUNTER — Telehealth: Payer: Self-pay

## 2019-11-09 ENCOUNTER — Other Ambulatory Visit: Payer: Self-pay | Admitting: Thoracic Surgery (Cardiothoracic Vascular Surgery)

## 2019-11-09 NOTE — Telephone Encounter (Signed)
Pts nurse w/ [facility] called to report that pt is having problems related to eating and his tube feedings. He is s/p esophagectomy by Dr. Kipp Brood on 10/18/19. Spoke w/ pt directly, and he reports that he was eating soft foods and not using tube feeding (as directed by Dr. Kipp Brood at his last OV on 7/9), but over the past two nights he has taken in tube feeding at 65 ml/hr over 10-12 hours d/t having several episodes of vomiting after he eats. He reports that it is painful for him to swallow anything other than liquids. He states he has been experiencing bloating and excessive belching since restarting the tube feeding, but he was able to eat a scrambled egg for breakfast and a bowl of creamed potatoes for lunch today without vomiting. Instructed pt to d/c tube feedings (as per Dr. Elliot Gault last OV note) and to eat small increments of soft food approximately every 2-3 hours. If he is unable to do this, instructed him to frequently take in nutritious liquids (in small to moderate amounts). OV w/ Dr. Kipp Brood scheduled for tomorrow at 10:30.

## 2019-11-10 ENCOUNTER — Other Ambulatory Visit: Payer: Self-pay | Admitting: Thoracic Surgery (Cardiothoracic Vascular Surgery)

## 2019-11-10 ENCOUNTER — Other Ambulatory Visit: Payer: Self-pay

## 2019-11-10 ENCOUNTER — Ambulatory Visit (INDEPENDENT_AMBULATORY_CARE_PROVIDER_SITE_OTHER): Payer: Self-pay | Admitting: Thoracic Surgery (Cardiothoracic Vascular Surgery)

## 2019-11-10 ENCOUNTER — Encounter: Payer: Self-pay | Admitting: Thoracic Surgery (Cardiothoracic Vascular Surgery)

## 2019-11-10 VITALS — BP 148/80 | HR 74 | Temp 97.5°F | Resp 20 | Ht 74.0 in | Wt 235.0 lb

## 2019-11-10 DIAGNOSIS — C159 Malignant neoplasm of esophagus, unspecified: Secondary | ICD-10-CM

## 2019-11-10 NOTE — Progress Notes (Signed)
     [address]       [place] [address]             [phone]       Mr. Jernigan comes in with a report of dysphagia and odynophagia.  He attempted to restart his tube feeds but had some bloating with that as well.  Prior to the last several days he was able to tolerate p.o. intake without any problems.  On further investigation I think that he has been attempting to eat large meals and not taking his time chewing and swallowing.  His dysphagia and odynophagia have been intermittent.  I stressed the importance of eating slower and taking smaller bites.  He also has very poor dentition this chewing is very difficult for him.  Otherwise he looks well.  He states that his daily weights have been stable at home from surprised that his weight was down to 235 pounds here in clinic.  I will obtain a DG esophagram at his next clinic appointment.  He will follow-up in 2 weeks  Nesreen Albano Bary Leriche

## 2019-11-13 ENCOUNTER — Emergency Department (HOSPITAL_COMMUNITY): Payer: BC Managed Care – PPO

## 2019-11-13 ENCOUNTER — Inpatient Hospital Stay (HOSPITAL_COMMUNITY)
Admission: EM | Admit: 2019-11-13 | Discharge: 2019-11-23 | DRG: 368 | Disposition: A | Discharge status: Home Health | Payer: BC Managed Care – PPO | Attending: Thoracic Surgery (Cardiothoracic Vascular Surgery) | Admitting: Thoracic Surgery (Cardiothoracic Vascular Surgery)

## 2019-11-13 ENCOUNTER — Other Ambulatory Visit: Payer: Self-pay

## 2019-11-13 ENCOUNTER — Encounter (HOSPITAL_COMMUNITY): Payer: Self-pay | Admitting: Emergency Medicine

## 2019-11-13 DIAGNOSIS — Z09 Encounter for follow-up examination after completed treatment for conditions other than malignant neoplasm: Secondary | ICD-10-CM

## 2019-11-13 DIAGNOSIS — M545 Low back pain: Secondary | ICD-10-CM | POA: Diagnosis present

## 2019-11-13 DIAGNOSIS — E43 Unspecified severe protein-calorie malnutrition: Secondary | ICD-10-CM | POA: Diagnosis present

## 2019-11-13 DIAGNOSIS — Y838 Other surgical procedures as the cause of abnormal reaction of the patient, or of later complication, without mention of misadventure at the time of the procedure: Secondary | ICD-10-CM | POA: Diagnosis present

## 2019-11-13 DIAGNOSIS — Z833 Family history of diabetes mellitus: Secondary | ICD-10-CM

## 2019-11-13 DIAGNOSIS — Z91048 Other nonmedicinal substance allergy status: Secondary | ICD-10-CM

## 2019-11-13 DIAGNOSIS — K9413 Enterostomy malfunction: Secondary | ICD-10-CM

## 2019-11-13 DIAGNOSIS — C159 Malignant neoplasm of esophagus, unspecified: Secondary | ICD-10-CM | POA: Diagnosis present

## 2019-11-13 DIAGNOSIS — Z9049 Acquired absence of other specified parts of digestive tract: Secondary | ICD-10-CM

## 2019-11-13 DIAGNOSIS — L89311 Pressure ulcer of right buttock, stage 1: Secondary | ICD-10-CM | POA: Diagnosis not present

## 2019-11-13 DIAGNOSIS — L8990 Pressure ulcer of unspecified site, unspecified stage: Secondary | ICD-10-CM | POA: Insufficient documentation

## 2019-11-13 DIAGNOSIS — Z955 Presence of coronary angioplasty implant and graft: Secondary | ICD-10-CM

## 2019-11-13 DIAGNOSIS — R1310 Dysphagia, unspecified: Secondary | ICD-10-CM | POA: Diagnosis present

## 2019-11-13 DIAGNOSIS — K229 Disease of esophagus, unspecified: Secondary | ICD-10-CM

## 2019-11-13 DIAGNOSIS — K223 Perforation of esophagus: Principal | ICD-10-CM

## 2019-11-13 DIAGNOSIS — T8189XA Other complications of procedures, not elsewhere classified, initial encounter: Secondary | ICD-10-CM | POA: Diagnosis present

## 2019-11-13 DIAGNOSIS — J42 Unspecified chronic bronchitis: Secondary | ICD-10-CM | POA: Diagnosis present

## 2019-11-13 DIAGNOSIS — I5022 Chronic systolic (congestive) heart failure: Secondary | ICD-10-CM | POA: Diagnosis present

## 2019-11-13 DIAGNOSIS — Z6830 Body mass index (BMI) 30.0-30.9, adult: Secondary | ICD-10-CM

## 2019-11-13 DIAGNOSIS — J45909 Unspecified asthma, uncomplicated: Secondary | ICD-10-CM | POA: Diagnosis present

## 2019-11-13 DIAGNOSIS — E781 Pure hyperglyceridemia: Secondary | ICD-10-CM | POA: Diagnosis present

## 2019-11-13 DIAGNOSIS — Z20822 Contact with and (suspected) exposure to covid-19: Secondary | ICD-10-CM | POA: Diagnosis present

## 2019-11-13 DIAGNOSIS — Z9221 Personal history of antineoplastic chemotherapy: Secondary | ICD-10-CM

## 2019-11-13 DIAGNOSIS — I110 Hypertensive heart disease with heart failure: Secondary | ICD-10-CM | POA: Diagnosis present

## 2019-11-13 DIAGNOSIS — I4891 Unspecified atrial fibrillation: Secondary | ICD-10-CM | POA: Diagnosis present

## 2019-11-13 DIAGNOSIS — Z79899 Other long term (current) drug therapy: Secondary | ICD-10-CM

## 2019-11-13 DIAGNOSIS — Z86718 Personal history of other venous thrombosis and embolism: Secondary | ICD-10-CM

## 2019-11-13 DIAGNOSIS — Z794 Long term (current) use of insulin: Secondary | ICD-10-CM

## 2019-11-13 DIAGNOSIS — Z87442 Personal history of urinary calculi: Secondary | ICD-10-CM

## 2019-11-13 DIAGNOSIS — E119 Type 2 diabetes mellitus without complications: Secondary | ICD-10-CM | POA: Diagnosis present

## 2019-11-13 DIAGNOSIS — F329 Major depressive disorder, single episode, unspecified: Secondary | ICD-10-CM | POA: Diagnosis present

## 2019-11-13 DIAGNOSIS — K219 Gastro-esophageal reflux disease without esophagitis: Secondary | ICD-10-CM | POA: Diagnosis present

## 2019-11-13 DIAGNOSIS — Y848 Other medical procedures as the cause of abnormal reaction of the patient, or of later complication, without mention of misadventure at the time of the procedure: Secondary | ICD-10-CM | POA: Diagnosis present

## 2019-11-13 DIAGNOSIS — I2510 Atherosclerotic heart disease of native coronary artery without angina pectoris: Secondary | ICD-10-CM | POA: Diagnosis present

## 2019-11-13 DIAGNOSIS — I252 Old myocardial infarction: Secondary | ICD-10-CM

## 2019-11-13 DIAGNOSIS — I440 Atrioventricular block, first degree: Secondary | ICD-10-CM | POA: Diagnosis present

## 2019-11-13 DIAGNOSIS — Z7982 Long term (current) use of aspirin: Secondary | ICD-10-CM

## 2019-11-13 DIAGNOSIS — R001 Bradycardia, unspecified: Secondary | ICD-10-CM | POA: Diagnosis not present

## 2019-11-13 DIAGNOSIS — R079 Chest pain, unspecified: Secondary | ICD-10-CM

## 2019-11-13 DIAGNOSIS — F419 Anxiety disorder, unspecified: Secondary | ICD-10-CM | POA: Diagnosis present

## 2019-11-13 DIAGNOSIS — M1990 Unspecified osteoarthritis, unspecified site: Secondary | ICD-10-CM | POA: Diagnosis present

## 2019-11-13 DIAGNOSIS — E46 Unspecified protein-calorie malnutrition: Secondary | ICD-10-CM

## 2019-11-13 DIAGNOSIS — T80818A Extravasation of other vesicant agent, initial encounter: Secondary | ICD-10-CM | POA: Diagnosis present

## 2019-11-13 DIAGNOSIS — D62 Acute posthemorrhagic anemia: Secondary | ICD-10-CM | POA: Diagnosis present

## 2019-11-13 DIAGNOSIS — G8929 Other chronic pain: Secondary | ICD-10-CM | POA: Diagnosis present

## 2019-11-13 DIAGNOSIS — K9423 Gastrostomy malfunction: Secondary | ICD-10-CM | POA: Diagnosis present

## 2019-11-13 DIAGNOSIS — E785 Hyperlipidemia, unspecified: Secondary | ICD-10-CM | POA: Diagnosis present

## 2019-11-13 DIAGNOSIS — C160 Malignant neoplasm of cardia: Secondary | ICD-10-CM | POA: Diagnosis present

## 2019-11-13 LAB — COMPREHENSIVE METABOLIC PANEL
ALT: 16 U/L (ref 0–44)
AST: 15 U/L (ref 15–41)
Albumin: 3.1 g/dL — ABNORMAL LOW (ref 3.5–5.0)
Alkaline Phosphatase: 87 U/L (ref 38–126)
Anion gap: 11 (ref 5–15)
BUN: 9 mg/dL (ref 6–20)
CO2: 26 mmol/L (ref 22–32)
Calcium: 8.6 mg/dL — ABNORMAL LOW (ref 8.9–10.3)
Chloride: 94 mmol/L — ABNORMAL LOW (ref 98–111)
Creatinine, Ser: 0.62 mg/dL (ref 0.61–1.24)
GFR calc Af Amer: >60 mL/min (ref >60)
GFR calc non Af Amer: >60 mL/min (ref >60)
Glucose, Bld: 107 mg/dL — ABNORMAL HIGH (ref 70–99)
Potassium: 3.6 mmol/L (ref 3.5–5.1)
Sodium: 131 mmol/L — ABNORMAL LOW (ref 135–145)
Total Bilirubin: 0.5 mg/dL (ref 0.3–1.2)
Total Protein: 7.1 g/dL (ref 6.5–8.1)

## 2019-11-13 LAB — LIPASE, BLOOD: Lipase: 14 U/L (ref 11–51)

## 2019-11-13 LAB — CBC WITH DIFFERENTIAL/PLATELET
Abs Immature Granulocytes: 0.07 10*3/uL (ref 0.00–0.07)
Basophils Absolute: 0 10*3/uL (ref 0.0–0.1)
Basophils Relative: 0 %
Eosinophils Absolute: 0 10*3/uL (ref 0.0–0.5)
Eosinophils Relative: 0 %
HCT: 33.4 % — ABNORMAL LOW (ref 39.0–52.0)
Hemoglobin: 11.2 g/dL — ABNORMAL LOW (ref 13.0–17.0)
Immature Granulocytes: 1 %
Lymphocytes Relative: 2 %
Lymphs Abs: 0.3 10*3/uL — ABNORMAL LOW (ref 0.7–4.0)
MCH: 31.2 pg (ref 26.0–34.0)
MCHC: 33.5 g/dL (ref 30.0–36.0)
MCV: 93 fL (ref 80.0–100.0)
Monocytes Absolute: 0.7 10*3/uL (ref 0.1–1.0)
Monocytes Relative: 5 %
Neutro Abs: 12.9 10*3/uL — ABNORMAL HIGH (ref 1.7–7.7)
Neutrophils Relative %: 92 %
Platelets: 329 10*3/uL (ref 150–400)
RBC: 3.59 MIL/uL — ABNORMAL LOW (ref 4.22–5.81)
RDW: 13.3 % (ref 11.5–15.5)
WBC: 14.1 10*3/uL — ABNORMAL HIGH (ref 4.0–10.5)
nRBC: 0 % (ref 0.0–0.2)

## 2019-11-13 LAB — TROPONIN I (HIGH SENSITIVITY): Troponin I (High Sensitivity): 3 ng/L (ref <18)

## 2019-11-13 MED ORDER — MORPHINE SULFATE (PF) 4 MG/ML IV SOLN
4.0000 mg | Freq: Once | INTRAVENOUS | Status: AC
  Administered 2019-11-13: 4 mg via INTRAVENOUS
  Filled 2019-11-13: qty 1

## 2019-11-13 MED ORDER — IOHEXOL 300 MG/ML  SOLN
100.0000 mL | Freq: Once | INTRAMUSCULAR | Status: AC | PRN
  Administered 2019-11-13: 100 mL via INTRAVENOUS

## 2019-11-13 MED ORDER — SODIUM CHLORIDE 0.9 % IV SOLN: INTRAVENOUS | Status: DC

## 2019-11-13 MED ORDER — PIPERACILLIN-TAZOBACTAM 3.375 G IVPB 30 MIN
3.3750 g | Freq: Once | INTRAVENOUS | Status: AC
  Administered 2019-11-14: 3.375 g via INTRAVENOUS
  Filled 2019-11-13: qty 50

## 2019-11-13 NOTE — ED Notes (Signed)
Pt reports esophogeal cancer   Radiation   Has had upper abd pain since yesterday that today moved into his chest and L arm   Here for eval

## 2019-11-13 NOTE — ED Triage Notes (Signed)
Pt states he has been having upper abd pain for 2 days, that started after he took a walk. Then today the pain moved up into his chest.

## 2019-11-13 NOTE — ED Provider Notes (Signed)
[facility] EMERGENCY DEPARTMENT Provider Note   CSN: [id number] Arrival date & time: 11/13/19  1910     History Chief Complaint  Patient presents with  . Chest Pain  . Abdominal Pain    Steven Crane is a 59 y.o. male.  HPI   This patient is a 59 year old male, he has a known history of multiple medical problems including congestive heart failure, he has a history of coronary disease with multiple stents, he has a history of esophageal cancer, deep venous thrombosis, hypertension, hyperlipidemia, his known ejection fraction is around 40%.  He is a diabetic.  The patient presents to the hospital today with a complaint of chest pain which starts in the chest and moves down towards his upper abdomen on the right side.  He reports that approximately 3-1/2 weeks ago he had a procedure to resect his esophageal cancer and to have a new esophagus built out of his stomach.  According to the medical record which I have reviewed, the cardiothoracic surgeon had done a robotic assisted Ivor Lewis esophagectomy, he was treated with neoadjuvant chemotherapy prior to surgery and had good pathologic responses.  He also had a pyloromyotomy, he had a jejunostomy tube that was placed during that procedure as well.  The patient had been using the jejunostomy tube but is no longer using it.  Instead he is taking food and fluids by mouth.  He was actually seen in the cardiothoracic office 2 days ago during which time he was complaining of odynophagia and dysphagia.  He was advised to take smaller meals and to chew his food well which she had likely not been doing prior to that.  The patient reports that he is now having some chest pain on the right side, this started earlier today, it has been rather persistent throughout the day and seems to radiate down into the right upper quadrant and epigastrium.  It does go to the back as well.  It is not making him short of breath but he states it makes me lose my wind"  when he does take of breath.  He has not had any significant swelling in his legs, his bowel movements have been normal, he has not had to use his jejunostomy tube.  He states this does not feel like his prior coronary disease however he does report that the pain started after he had been walking in the community, he walks about 20 minutes/day at the recommendation of his surgeon  Past Medical History:  Diagnosis Date  . Anxiety   . Arthritis    "hands, back, knees" (08/27/2013)  . Asthma   . CHF (congestive heart failure) ([place]) 02/2010  . Chronic bronchitis ([place])    "get it q year"  . Chronic lower back pain   . Closed head injury 1975  . Coronary artery disease May 2015   s/p successful PTCA/DES x 1 to distal RCA and PTCA/DES x 1 to OM-20 Aug 2013  . Depression   . DVT (deep venous thrombosis) ([place]) 2008   "LLE"  . Esophageal cancer ([place])   . GERD (gastroesophageal reflux disease)   . Headache    hs of but no longer has problems   . History of kidney stones   . Hyperlipemia   . Hypertension   . Hypertriglyceridemia   . LV dysfunction    LVEF 40% at time of cardiac cath May 2015  . Morbid obesity ([place])   . Myocardial infarction ([place]) 02/2010  .  Psoriasis   . Type II diabetes mellitus [facility])     Patient Active Problem List   Diagnosis Date Noted  . Protein-calorie malnutrition, severe 10/20/2019  . Esophageal cancer ([place]) 10/18/2019  . Failure to thrive (0-17) 09/21/2019  . Neutropenia, drug-induced ([place]) 08/23/2019  . 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 GERD (gastroesophageal reflux disease)   . Esophageal dysphagia   . Displaced oblique fracture of shaft of left tibia, initial encounter for closed fracture 08/21/2018  . Displaced comminuted fracture of shaft of left tibia, initial encounter for closed fracture 08/21/2018  . Unstable angina ([place]) 08/27/2013  . CAD S/P percutaneous coronary angioplasty  2011 08/06/2013  . Chest pain 08/06/2013  . Kidney stone 04/05/2013  . Reactive airways dysfunction syndrome ([place]) 11/30/2012  . Hyperlipemia 09/25/2012  . Diabetes mellitus without complication ([place]) 25/42/7062  . Atherosclerotic heart disease 09/25/2012  . Essential hypertension, benign 09/25/2012  . Morbid obesity ([place]) 09/25/2012  . Psoriasis 09/25/2012  . History of DVT (deep vein thrombosis) 09/25/2012    Past Surgical History:  Procedure Laterality Date  . BIOPSY  06/17/2019   Procedure: BIOPSY;  Surgeon: [name], MD;  Location: [facility] END[facility];  Service: Endoscopy;;  . COLONOSCOPY  01/06/2012   Dr. [name]: suboptimal prep. normal exam. next colonoscopy in 10 years.   . CORONARY ANGIOPLASTY WITH STENT PLACEMENT  02/2010; 08/27/2013   "1 + 3"   . ESOPHAGOGASTRODUODENOSCOPY N/A 10/18/2019   Procedure: ESOPHAGOGASTRODUODENOSCOPY (EGD);  Surgeon: Lajuana Matte, MD;  Location: [facility] OR;  Service: Thoracic;  Laterality: N/A;  . ESOPHAGOGASTRODUODENOSCOPY (EGD) WITH PROPOFOL N/A 06/17/2019   Procedure: ESOPHAGOGASTRODUODENOSCOPY (EGD) WITH PROPOFOL;  Surgeon: [name], MD;  Location: [facility];  Service: Endoscopy;  Laterality: N/A;  11:15am  . INTERCOSTAL NERVE BLOCK  10/18/2019   Procedure: INTERCOSTAL NERVE BLOCK;  Surgeon: Lajuana Matte, MD;  Location: [place];  Service: Thoracic;;  . IR IMAGING GUIDED PORT INSERTION  08/03/2019  . KNEE ARTHROSCOPY Left 1981  . LEFT HEART CATHETERIZATION WITH CORONARY ANGIOGRAM N/A 08/27/2013   Procedure: LEFT HEART CATHETERIZATION WITH CORONARY ANGIOGRAM;  Surgeon: [name], MD;  Location: [facility] CATH LAB;  Service: Cardiovascular;  Laterality: N/A;  . NODE DISSECTION  10/18/2019   Procedure: NODE DISSECTION;  Surgeon: Lajuana Matte, MD;  Location: [place];  Service: Thoracic;;  . right wrist fracture surgery     . TIBIA IM NAIL INSERTION Left 08/21/2018   Procedure: INTRAMEDULLARY (IM) NAIL TIBIAL;  Surgeon:  Rod Can, MD;  Location: [facility] ORS;  Service: Orthopedics;  Laterality: Left;       Family History  Problem Relation Age of Onset  . Coronary artery disease Father   . Diabetes type II Father   . Diabetes Father   . Heart attack Father   . Hypertension Mother     Social History   Tobacco Use  . Smoking status: Never Smoker  . Smokeless tobacco: Never Used  . Tobacco comment: "started dipping @ age 57; quit for 5 1/2 yrs at one time; hadn't chewed in awhile"  Vaping Use  . Vaping Use: Never used  Substance Use Topics  . Alcohol use: Not Currently    Alcohol/week: 0.0 standard drinks    Comment: occ   . Drug use: No    Home Medications Prior to Admission medications   Medication Sig Start Date End Date Taking? Authorizing Provider  acetaminophen (TYLENOL) 160 MG/5ML solution  Place 20.3 mLs (650 mg total) into feeding tube every 6 (six) hours as needed for moderate pain. 10/25/19   Roddenberry, Arlis Porta, PA-C  albuterol (PROVENTIL HFA;VENTOLIN HFA) 108 (90 Base) MCG/ACT inhaler Inhale 2 puffs into the lungs every 6 (six) hours as needed for wheezing or shortness of breath. 01/28/17   Kathyrn Drown, MD  ALPRAZolam (XANAX) 0.5 MG tablet TAKE ONE-HALF TO ONE TABLET BY MOUTH ONCE DAILY AS NEEDED Patient taking differently: Take 0.5-75 mg by mouth daily as needed for anxiety.  07/27/19   [name], MD  amiodarone (PACERONE) 200 MG tablet Take 2 tablets (400 mg total) by mouth 2 (two) times daily. Must be crushed. 10/29/19   Lajuana Matte, MD  aspirin EC 81 MG tablet Take 1 tablet (81 mg total) by mouth daily. Patient taking differently: Take 81 mg by mouth every evening.  04/10/18   Herminio Commons, MD  atorvastatin (LIPITOR) 40 MG tablet Take 1 tablet by mouth once daily Patient taking differently: Take 40 mg by mouth at bedtime.  07/15/19   Herminio Commons, MD  blood glucose meter kit and supplies Test glucose once daily. ICD 10 E11.9 09/26/17   [name], MD  calcium carbonate (TUMS - DOSED IN MG ELEMENTAL CALCIUM) 500 MG chewable tablet Chew 2 tablets by mouth 2 (two) times daily as needed for indigestion or heartburn.    [provider]  insulin detemir (LEVEMIR) 100 UNIT/ML injection Inject 0.5 mLs (50 Units total) into the skin at bedtime. 11/02/19   Erven Colla, DO  lidocaine-prilocaine (EMLA) cream Apply a small amount to port a cath site and cover with plastic wrap one hour prior to infusion appointments Patient taking differently: Apply 1 application topically See admin instructions. Apply a small amount to port a cath site and cover with plastic wrap one hour prior to infusion appointments 07/21/19   Derek Jack, MD  metFORMIN (GLUCOPHAGE) 500 MG tablet 1 tablet (500 mg total) by Per J Tube route 2 (two) times daily with a meal. Crush and put medication in J-tube 11/07/19   Lovena Le, Malena M, DO  metoCLOPramide (REGLAN) 5 MG/5ML solution TAKE 5 ML BY MOUTH  4 TIMES DAILY BEFORE MEAL(S) AND AT BEDTIME 11/09/19   Lightfoot, Lucile Crater, MD  traMADol (ULTRAM) 5 mg/mL SUSP Take 5 mLs (25 mg total) by mouth every 6 (six) hours as needed. Patient not taking: Reported on 11/10/2019 11/02/19   Erven Colla, DO    Allergies    Adhesive [tape]  Review of Systems   Review of Systems  All other systems reviewed and are negative.   Physical Exam Updated Vital Signs BP (!) 166/87 (BP Location: Right Arm)   Pulse 78   Temp 97.9 F (36.6 C) (Oral)   Resp 20   Ht 1.88 m (6' 2")   Wt (!) 106.6 kg   SpO2 97%   BMI 30.17 kg/m   Physical Exam Vitals and nursing note reviewed.  Constitutional:      General: He is not in acute distress.    Appearance: He is well-developed.  HENT:     Head: Normocephalic and atraumatic.     Mouth/Throat:     Pharynx: No oropharyngeal exudate.  Eyes:     General: No scleral icterus.       Right eye: No discharge.        Left eye: No discharge.     Conjunctiva/sclera: Conjunctivae  normal.  Pupils: Pupils are equal, round, and reactive to light.  Neck:     Thyroid: No thyromegaly.     Vascular: No JVD.  Cardiovascular:     Rate and Rhythm: Normal rate and regular rhythm.     Heart sounds: Normal heart sounds. No murmur heard.  No friction rub. No gallop.   Pulmonary:     Effort: Pulmonary effort is normal. No respiratory distress.     Breath sounds: Normal breath sounds. No wheezing or rales.  Abdominal:     General: Bowel sounds are normal. There is no distension.     Palpations: Abdomen is soft. There is no mass.     Tenderness: There is abdominal tenderness.     Comments: There is tenderness in the right upper quadrant and epigastrium, no guarding, no peritoneal signs, very soft abdomen otherwise, J-tube is in position  Musculoskeletal:        General: No tenderness. Normal range of motion.     Cervical back: Normal range of motion and neck supple.  Lymphadenopathy:     Cervical: No cervical adenopathy.  Skin:    General: Skin is warm and dry.     Findings: No erythema or rash.  Neurological:     Mental Status: He is alert.     Coordination: Coordination normal.  Psychiatric:        Behavior: Behavior normal.     ED Results / Procedures / Treatments   Labs (all labs ordered are listed, but only abnormal results are displayed) Labs Reviewed  CBC WITH DIFFERENTIAL/PLATELET - Abnormal; Notable for the following components:      Result Value   WBC 14.1 (*)    RBC 3.59 (*)    Hemoglobin 11.2 (*)    HCT 33.4 (*)    Neutro Abs 12.9 (*)    Lymphs Abs 0.3 (*)    All other components within normal limits  COMPREHENSIVE METABOLIC PANEL - Abnormal; Notable for the following components:   Sodium 131 (*)    Chloride 94 (*)    Glucose, Bld 107 (*)    Calcium 8.6 (*)    Albumin 3.1 (*)    All other components within normal limits  SARS CORONAVIRUS 2 BY RT PCR (HOSPITAL ORDER, [place] [facility])  LIPASE, BLOOD  TROPONIN I (HIGH  SENSITIVITY)    EKG EKG Interpretation  Date/Time:  Saturday November 13 2019 19:20:42 EDT Ventricular Rate:  86 PR Interval:  172 QRS Duration: 104 QT Interval:  402 QTC Calculation: 481 R Axis:   -54 Text Interpretation: Normal sinus rhythm Possible Left atrial enlargement Left axis deviation Inferior infarct , age undetermined Possible Anterior infarct , age undetermined Abnormal ECG Since last tracing rate more regular Confirmed by Noemi Chapel [phone]) on 11/13/2019 8:39:45 PM   Radiology CT Chest W Contrast  Addendum Date: 11/13/2019   ADDENDUM REPORT: 11/13/2019 22:47 ADDENDUM: Impression 2 should read: Diffuse circumferential thickening of the neoesophagus with a small perforation along the mesial aspect with extraluminal gas and fluid contained by the posterior mediastinum and layering adjacent the descending thoracic aorta. Some mild reactive thickening of the aortic wall may be present but without acute luminal abnormality or clear fistulization. Impression 7 should read: Postsurgical changes along the right lateral chest wall with additional abdominal trocar site to the right of a small fluid and fat containing umbilical hernia. No acute complication at the operative sites. Electronically Signed   By: Lovena Le M.D.   On:  11/13/2019 22:47   Result Date: 11/13/2019 CLINICAL DATA:  Epigastric pain, recent esophageal cancer surgery, robotic assisted Ivor-Lewis esophagectomy performed 10/18/2019 EXAM: CT CHEST, ABDOMEN, AND PELVIS WITH CONTRAST TECHNIQUE: Multidetector CT imaging of the chest, abdomen and pelvis was performed following the standard protocol during bolus administration of intravenous contrast. CONTRAST:  129m OMNIPAQUE IOHEXOL 300 MG/ML  SOLN COMPARISON:  None. FINDINGS: CT CHEST FINDINGS Cardiovascular: Normal cardiac size. Three-vessel coronary artery calcifications are present. No pericardial effusion. Minimal atherosclerotic plaque in the normal caliber thoracic aorta.  Normal 3 vessel branching of the aortic arch without acute luminal abnormality of the proximal great vessels are aorta. Small amount of stranding adjacent the aorta favored to be related to the postsurgical change and complications further detailed in the mediastinal section below. Central pulmonary arteries are normal caliber. No large central filling defects are seen on this non tailored examination of pulmonary arteries. Right IJ approach Port-A-Cath tip terminates at the superior cavoatrial junction. No major venous abnormalities. Mediastinum/Nodes: There are postsurgical changes from recent Ivor-Lewis esophagectomy with tubularization and pull-through of the stomach. There is diffuse circumferential thickening of the esophagus however, a small outpouching is noted along the mesial aspect of the neo esophagus (2/31) with a small amount of extraluminal gas and fluid contained within the retroperitoneum and layering anterior to the aorta. Some mild reactive thickening of the aorta at wall may be present. (2/35) no clear fistulization is seen at this time. No acute abnormality of the trachea. Thyroid gland and thoracic inlet are unremarkable. No worrisome mediastinal, hilar or axillary adenopathy. Lungs/Pleura: Small left and moderate right pleural effusions with adjacent areas of atelectasis including more bandlike areas of subsegmental atelectasis in the right lower lobe. Underlying airspace disease is not fully excluded this is less favored this time. No pneumothorax. No other consolidative opacities. Musculoskeletal: Multilevel degenerative changes are present in the imaged portions of the spine. Multilevel flowing anterior osteophytosis, compatible with features of diffuse idiopathic skeletal hyperostosis (DISH). Postsurgical changes along the right lateral chest wall compatible with recent robotic assisted thoracotomy no visible displaced rib fractures. No acute or worrisome osseous lesions. Mild body wall  edema. CT ABDOMEN PELVIS FINDINGS Hepatobiliary: No worrisome focal liver lesions. Smooth liver surface contour. Diffuse hepatic hypoattenuation compatible with hepatic steatosis. Sparing along the gallbladder fossa. High attenuation material layering dependently within gallbladder, could reflect small amount of biliary sludge. No pericholecystic fluid or inflammation. No biliary ductal dilatation. Pancreas: Partial fatty replacement of the pancreas. No pancreatic ductal dilatation or surrounding inflammatory changes. Spleen: Normal in size. No concerning splenic lesions. Adrenals/Urinary Tract: Normal adrenal glands. Asymmetric delayed left nephrogram with some asymmetrically increased left perinephric stranding. No concerning renal masses. No obstructive urolithiasis or hydronephrosis. Few scattered nonobstructing calculi in both kidneys. Bladder is decompressed mild circumferential bladder wall thickening perivesicular haze is present however. Stomach/Bowel: Postsurgical changes of the neo esophagus with potential perforation in the posterior mediastinum, as detailed above. Stable line extending through the diaphragmatic hiatus in the upper abdomen appears intact. Duodenum with a normal course across the midline abdomen. No frank small bowel wall thickening or dilatation. A percutaneous jejunostomy seen in the left upper quadrant. A normal appendix is visualized. No colonic dilatation or wall thickening. Vascular/Lymphatic: Atherosclerotic calcifications within the abdominal aorta and branch vessels. No aneurysm or ectasia. No enlarged abdominopelvic lymph nodes. 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 Reproductive: The prostate  and seminal vesicles are unremarkable. Other: Some additional edematous changes in the central mesentery as well as moderate diffuse body wall edema.  Postsurgical changes along the small amount of fluid contained within the umbilical fat containing hernia. Trocar site noted at the level of the umbilicus just to the right of midline. No acute abnormality of the trocar site. Musculoskeletal: Multilevel degenerative changes are present in the imaged portions of the spine. Levocurvature of the lumbar spine apex L4. Partial ankylosis across the right SI joint. Additional moderate degenerative changes in the bilateral hips. IMPRESSION: 1. Postsurgical changes from recent Ivor-Lewis esophagectomy with tubularization and pull-through of the stomach. 2. Diffuse circumferential thickening of the neoesophagus with a small outpouching along the mesial aspect with a small amount of extraluminal gas and fluid contained within the retroperitoneum and layering adjacent the descending thoracic aorta. Some mild reactive thickening of the aorta at wall may be present. No clear fistulization is seen at this time. 3. Small left and moderate right pleural effusions with adjacent areas of atelectasis including more bandlike areas of subsegmental atelectasis in the right lower lobe. Underlying airspace disease is not fully excluded though less favored. 4. Asymmetric delayed left nephrogram with some asymmetrically increased left perinephric stranding. Findings are concerning for ascending urinary tract infection. Recommend correlation with urinalysis. 5. Additional bilateral nonobstructing nephrolithiasis. 6. Hepatic steatosis. 7. Postsurgical changes along the small amount of fluid contained within the umbilical fat containing hernia. No acute abnormality of the trocar site. 8. Aortic Atherosclerosis (ICD10-I70.0). These results were called by telephone at the time of interpretation on 11/13/2019 at 10:39 pm to provider Evalee Jefferson, who verbally acknowledged these results. Electronically Signed: By: Lovena Le M.D. On: 11/13/2019 22:41   CT ABDOMEN PELVIS W CONTRAST  Addendum Date:  11/13/2019   ADDENDUM REPORT: 11/13/2019 22:47 ADDENDUM: Impression 2 should read: Diffuse circumferential thickening of the neoesophagus with a small perforation along the mesial aspect with extraluminal gas and fluid contained by the posterior mediastinum and layering adjacent the descending thoracic aorta. Some mild reactive thickening of the aortic wall may be present but without acute luminal abnormality or clear fistulization. Impression 7 should read: Postsurgical changes along the right lateral chest wall with additional abdominal trocar site to the right of a small fluid and fat containing umbilical hernia. No acute complication at the operative sites. Electronically Signed   By: Lovena Le M.D.   On: 11/13/2019 22:47   Result Date: 11/13/2019 CLINICAL DATA:  Epigastric pain, recent esophageal cancer surgery, robotic assisted Ivor-Lewis esophagectomy performed 10/18/2019 EXAM: CT CHEST, ABDOMEN, AND PELVIS WITH CONTRAST TECHNIQUE: Multidetector CT imaging of the chest, abdomen and pelvis was performed following the standard protocol during bolus administration of intravenous contrast. CONTRAST:  142m OMNIPAQUE IOHEXOL 300 MG/ML  SOLN COMPARISON:  None. FINDINGS: CT CHEST FINDINGS Cardiovascular: Normal cardiac size. Three-vessel coronary artery calcifications are present. No pericardial effusion. Minimal atherosclerotic plaque in the normal caliber thoracic aorta. Normal 3 vessel branching of the aortic arch without acute luminal abnormality of the proximal great vessels are aorta. Small amount of stranding adjacent the aorta favored to be related to the postsurgical change and complications further detailed in the mediastinal section below. Central pulmonary arteries are normal caliber. No large central filling defects are seen on this non tailored examination of pulmonary arteries. Right IJ approach Port-A-Cath tip terminates at the superior cavoatrial junction. No major venous abnormalities.  Mediastinum/Nodes: There are postsurgical changes from recent Ivor-Lewis esophagectomy with tubularization and pull-through of the  stomach. There is diffuse circumferential thickening of the esophagus however, a small outpouching is noted along the mesial aspect of the neo esophagus (2/31) with a small amount of extraluminal gas and fluid contained within the retroperitoneum and layering anterior to the aorta. Some mild reactive thickening of the aorta at wall may be present. (2/35) no clear fistulization is seen at this time. No acute abnormality of the trachea. Thyroid gland and thoracic inlet are unremarkable. No worrisome mediastinal, hilar or axillary adenopathy. Lungs/Pleura: Small left and moderate right pleural effusions with adjacent areas of atelectasis including more bandlike areas of subsegmental atelectasis in the right lower lobe. Underlying airspace disease is not fully excluded this is less favored this time. No pneumothorax. No other consolidative opacities. Musculoskeletal: Multilevel degenerative changes are present in the imaged portions of the spine. Multilevel flowing anterior osteophytosis, compatible with features of diffuse idiopathic skeletal hyperostosis (DISH). Postsurgical changes along the right lateral chest wall compatible with recent robotic assisted thoracotomy no visible displaced rib fractures. No acute or worrisome osseous lesions. Mild body wall edema. CT ABDOMEN PELVIS FINDINGS Hepatobiliary: No worrisome focal liver lesions. Smooth liver surface contour. Diffuse hepatic hypoattenuation compatible with hepatic steatosis. Sparing along the gallbladder fossa. High attenuation material layering dependently within gallbladder, could reflect small amount of biliary sludge. No pericholecystic fluid or inflammation. No biliary ductal dilatation. Pancreas: Partial fatty replacement of the pancreas. No pancreatic ductal dilatation or surrounding inflammatory changes. Spleen: Normal in  size. No concerning splenic lesions. Adrenals/Urinary Tract: Normal adrenal glands. Asymmetric delayed left nephrogram with some asymmetrically increased left perinephric stranding. No concerning renal masses. No obstructive urolithiasis or hydronephrosis. Few scattered nonobstructing calculi in both kidneys. Bladder is decompressed mild circumferential bladder wall thickening perivesicular haze is present however. Stomach/Bowel: Postsurgical changes of the neo esophagus with potential perforation in the posterior mediastinum, as detailed above. Stable line extending through the diaphragmatic hiatus in the upper abdomen appears intact. Duodenum with a normal course across the midline abdomen. No frank small bowel wall thickening or dilatation. A percutaneous jejunostomy seen in the left upper quadrant. A normal appendix is visualized. No colonic dilatation or wall thickening. Vascular/Lymphatic: Atherosclerotic calcifications within the abdominal aorta and branch vessels. No aneurysm or ectasia. No enlarged abdominopelvic lymph nodes. 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 Reproductive: The prostate and seminal vesicles are unremarkable. Other: Some additional edematous changes in the central mesentery as well as moderate diffuse body wall edema. Postsurgical changes along the small amount of fluid contained within the umbilical fat containing hernia. Trocar site noted at the level of the umbilicus just to the right of midline. No acute abnormality of the trocar site. Musculoskeletal: Multilevel degenerative changes are present in the imaged portions of the spine. Levocurvature of the lumbar spine apex L4. Partial ankylosis across the right SI joint. Additional moderate degenerative changes in the bilateral hips. IMPRESSION: 1. 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 2. Diffuse circumferential thickening of the neoesophagus with a small outpouching along the mesial aspect with a small amount of extraluminal gas and fluid contained within the retroperitoneum and layering adjacent the descending thoracic aorta. Some mild reactive thickening of the aorta at wall may be present. No clear fistulization is seen at this time. 3. Small left and moderate right pleural effusions with adjacent areas of atelectasis including more bandlike areas of subsegmental atelectasis  in the right lower lobe. Underlying airspace disease is not fully excluded though less favored. 4. Asymmetric delayed left nephrogram with some asymmetrically increased left perinephric stranding. Findings are concerning for ascending urinary tract infection. Recommend correlation with urinalysis. 5. Additional bilateral nonobstructing nephrolithiasis. 6. Hepatic steatosis. 7. Postsurgical changes along the small amount of fluid contained within the umbilical fat containing hernia. No acute abnormality of the trocar site. 8. Aortic Atherosclerosis (ICD10-I70.0). These results were called by telephone at the time of interpretation on 11/13/2019 at 10:39 pm to provider Evalee Jefferson, who verbally acknowledged these results. Electronically Signed: By: Lovena Le M.D. On: 11/13/2019 22:41   DG Chest Port 1 View  Result Date: 11/13/2019 CLINICAL DATA:  Chest pain EXAM: PORTABLE CHEST 1 VIEW COMPARISON:  October 25, 2019 FINDINGS: There is a stable right-sided Port-A-Cath. The heart size is unchanged parent there are probable trace bilateral pleural effusions, right greater than left. There is no pneumothorax. No focal infiltrate. There is no acute osseous abnormality. There are gas distended loops of bowel in the upper abdomen. IMPRESSION: 1. Probable trace bilateral pleural effusions, right greater than left. 2. Gas distended loops of bowel in the upper abdomen.  Electronically Signed   By: Constance Holster M.D.   On: 11/13/2019 21:22    Procedures .Critical Care Performed by: Noemi Chapel, MD Authorized by: Noemi Chapel, MD   Critical care provider statement:    Critical care time (minutes):  35   Critical care time was exclusive of:  Separately billable procedures and treating other patients and teaching time   Critical care was necessary to treat or prevent imminent or life-threatening deterioration of the following conditions: perforated viscus.   Critical care was time spent personally by me on the following activities:  Blood draw for specimens, development of treatment plan with patient or surrogate, discussions with consultants, evaluation of patient's response to treatment, examination of patient, obtaining history from patient or surrogate, ordering and performing treatments and interventions, ordering and review of laboratory studies, ordering and review of radiographic studies, pulse oximetry, re-evaluation of patient's condition and review of old charts   (including critical care time)  Medications Ordered in ED Medications  0.9 %  sodium chloride infusion ( Intravenous New Bag/Given 11/13/19 2148)  piperacillin-tazobactam (ZOSYN) IVPB 3.375 g (has no administration in time range)  morphine 4 MG/ML injection 4 mg (4 mg Intravenous Given 11/13/19 2149)  iohexol (OMNIPAQUE) 300 MG/ML solution 100 mL (100 mLs Intravenous Contrast Given 11/13/19 2216)    ED Course  I have reviewed the triage vital signs and the nursing notes.  Pertinent labs & imaging results that were available during my care of the patient were reviewed by me and considered in my medical decision making (see chart for details).    MDM Rules/Calculators/A&P                          The patient's heart and lung exam is unremarkable, he does have some reproducible tenderness across the lower chest wall and into the right upper quadrant.  His EKG thankfully does not  show any signs of acute ischemia.  It does show some Q waves in the inferior leads and poor R wave progression but no ST elevation or depression.  He will need labs including a lipase and a troponin but I would also recommend a CT scan of the chest abdomen and pelvis given his recent major thoracoabdominal surgery to make sure there  is no signs of complicating factors.  The patient is agreeable to the plan, his vital signs are reassuring without fever tachycardia or hypotension.  He does have a leukocytosis.  The patient has potential for perforation in the esophagus.  Antibiotics were ordered, Covid test ordered  I discussed the care with Dr. Servando Snare of the cardiothoracic surgery service.  This patient has what could be a perforation of the esophagus with some air and fluid in the mediastinum.  Dr. Servando Snare has seen the CT scans and feels that oral contrast would be more beneficial to help highlight whether this is a true perforation.  The ability to do that study here tonight is a nonreality as we do not have a radiologist on site.  This patient will need to be transferred to [facility], ED to have the rest of the studies, Dr. Servando Snare wishes to be consulted after the studies have returned to discuss results if abnormal.  He requests a CT chest immediately after swallow study to look at esophagus =- these studies were ordered at this time.  11:15 PM  Dr. Christy Gentles at [facility] ED has accepted transfer of patient.  Final Clinical Impression(s) / ED Diagnoses Final diagnoses:  Esophageal abnormality  Chest pain, unspecified type      Noemi Chapel, MD 11/13/19 2315

## 2019-11-14 ENCOUNTER — Emergency Department (HOSPITAL_COMMUNITY): Payer: BC Managed Care – PPO

## 2019-11-14 DIAGNOSIS — K223 Perforation of esophagus: Secondary | ICD-10-CM | POA: Diagnosis present

## 2019-11-14 DIAGNOSIS — Z20822 Contact with and (suspected) exposure to covid-19: Secondary | ICD-10-CM | POA: Diagnosis present

## 2019-11-14 DIAGNOSIS — E119 Type 2 diabetes mellitus without complications: Secondary | ICD-10-CM | POA: Diagnosis present

## 2019-11-14 DIAGNOSIS — I110 Hypertensive heart disease with heart failure: Secondary | ICD-10-CM | POA: Diagnosis present

## 2019-11-14 DIAGNOSIS — J95812 Postprocedural air leak: Secondary | ICD-10-CM | POA: Diagnosis not present

## 2019-11-14 DIAGNOSIS — R1310 Dysphagia, unspecified: Secondary | ICD-10-CM | POA: Diagnosis present

## 2019-11-14 DIAGNOSIS — F329 Major depressive disorder, single episode, unspecified: Secondary | ICD-10-CM | POA: Diagnosis present

## 2019-11-14 DIAGNOSIS — F419 Anxiety disorder, unspecified: Secondary | ICD-10-CM | POA: Diagnosis present

## 2019-11-14 DIAGNOSIS — K9413 Enterostomy malfunction: Secondary | ICD-10-CM | POA: Diagnosis present

## 2019-11-14 DIAGNOSIS — M545 Low back pain: Secondary | ICD-10-CM | POA: Diagnosis present

## 2019-11-14 DIAGNOSIS — C160 Malignant neoplasm of cardia: Secondary | ICD-10-CM | POA: Diagnosis present

## 2019-11-14 DIAGNOSIS — I2510 Atherosclerotic heart disease of native coronary artery without angina pectoris: Secondary | ICD-10-CM | POA: Diagnosis present

## 2019-11-14 DIAGNOSIS — C159 Malignant neoplasm of esophagus, unspecified: Secondary | ICD-10-CM | POA: Diagnosis present

## 2019-11-14 DIAGNOSIS — Y838 Other surgical procedures as the cause of abnormal reaction of the patient, or of later complication, without mention of misadventure at the time of the procedure: Secondary | ICD-10-CM | POA: Diagnosis present

## 2019-11-14 DIAGNOSIS — G8929 Other chronic pain: Secondary | ICD-10-CM | POA: Diagnosis present

## 2019-11-14 DIAGNOSIS — E781 Pure hyperglyceridemia: Secondary | ICD-10-CM | POA: Diagnosis present

## 2019-11-14 DIAGNOSIS — I5022 Chronic systolic (congestive) heart failure: Secondary | ICD-10-CM | POA: Diagnosis present

## 2019-11-14 DIAGNOSIS — Y848 Other medical procedures as the cause of abnormal reaction of the patient, or of later complication, without mention of misadventure at the time of the procedure: Secondary | ICD-10-CM | POA: Diagnosis present

## 2019-11-14 DIAGNOSIS — K219 Gastro-esophageal reflux disease without esophagitis: Secondary | ICD-10-CM | POA: Diagnosis present

## 2019-11-14 DIAGNOSIS — J42 Unspecified chronic bronchitis: Secondary | ICD-10-CM | POA: Diagnosis present

## 2019-11-14 DIAGNOSIS — T80818A Extravasation of other vesicant agent, initial encounter: Secondary | ICD-10-CM | POA: Diagnosis present

## 2019-11-14 DIAGNOSIS — M1990 Unspecified osteoarthritis, unspecified site: Secondary | ICD-10-CM | POA: Diagnosis present

## 2019-11-14 DIAGNOSIS — J45909 Unspecified asthma, uncomplicated: Secondary | ICD-10-CM | POA: Diagnosis present

## 2019-11-14 DIAGNOSIS — E43 Unspecified severe protein-calorie malnutrition: Secondary | ICD-10-CM | POA: Diagnosis present

## 2019-11-14 DIAGNOSIS — D62 Acute posthemorrhagic anemia: Secondary | ICD-10-CM | POA: Diagnosis present

## 2019-11-14 DIAGNOSIS — K9423 Gastrostomy malfunction: Secondary | ICD-10-CM | POA: Diagnosis present

## 2019-11-14 DIAGNOSIS — E785 Hyperlipidemia, unspecified: Secondary | ICD-10-CM | POA: Diagnosis present

## 2019-11-14 LAB — GLUCOSE, CAPILLARY
Glucose-Capillary: 100 mg/dL — ABNORMAL HIGH (ref 70–99)
Glucose-Capillary: 121 mg/dL — ABNORMAL HIGH (ref 70–99)
Glucose-Capillary: 125 mg/dL — ABNORMAL HIGH (ref 70–99)
Glucose-Capillary: 128 mg/dL — ABNORMAL HIGH (ref 70–99)
Glucose-Capillary: 136 mg/dL — ABNORMAL HIGH (ref 70–99)
Glucose-Capillary: 99 mg/dL (ref 70–99)

## 2019-11-14 LAB — SARS CORONAVIRUS 2 BY RT PCR (HOSPITAL ORDER, PERFORMED IN ~~LOC~~ HOSPITAL LAB): SARS Coronavirus 2: NEGATIVE

## 2019-11-14 LAB — BASIC METABOLIC PANEL
Anion gap: 8 (ref 5–15)
BUN: 5 mg/dL — ABNORMAL LOW (ref 6–20)
CO2: 26 mmol/L (ref 22–32)
Calcium: 8.3 mg/dL — ABNORMAL LOW (ref 8.9–10.3)
Chloride: 100 mmol/L (ref 98–111)
Creatinine, Ser: 0.56 mg/dL — ABNORMAL LOW (ref 0.61–1.24)
GFR calc Af Amer: >60 mL/min (ref >60)
GFR calc non Af Amer: >60 mL/min (ref >60)
Glucose, Bld: 101 mg/dL — ABNORMAL HIGH (ref 70–99)
Potassium: 3.7 mmol/L (ref 3.5–5.1)
Sodium: 134 mmol/L — ABNORMAL LOW (ref 135–145)

## 2019-11-14 LAB — CBC
HCT: 29.2 % — ABNORMAL LOW (ref 39.0–52.0)
Hemoglobin: 9.9 g/dL — ABNORMAL LOW (ref 13.0–17.0)
MCH: 31.3 pg (ref 26.0–34.0)
MCHC: 33.9 g/dL (ref 30.0–36.0)
MCV: 92.4 fL (ref 80.0–100.0)
Platelets: 297 10*3/uL (ref 150–400)
RBC: 3.16 MIL/uL — ABNORMAL LOW (ref 4.22–5.81)
RDW: 13.2 % (ref 11.5–15.5)
WBC: 11.6 10*3/uL — ABNORMAL HIGH (ref 4.0–10.5)
nRBC: 0 % (ref 0.0–0.2)

## 2019-11-14 LAB — MRSA PCR SCREENING: MRSA by PCR: NEGATIVE

## 2019-11-14 LAB — LACTIC ACID, PLASMA: Lactic Acid, Venous: 0.9 mmol/L (ref 0.5–1.9)

## 2019-11-14 MED ORDER — PIPERACILLIN-TAZOBACTAM 3.375 G IVPB 30 MIN: 3.3750 g | Freq: Once | INTRAVENOUS | Status: DC

## 2019-11-14 MED ORDER — INSULIN DETEMIR 100 UNIT/ML ~~LOC~~ SOLN
10.0000 [IU] | Freq: Every day | SUBCUTANEOUS | Status: DC
  Administered 2019-11-14 – 2019-11-17 (×4): 10 [IU] via SUBCUTANEOUS
  Filled 2019-11-14 (×5): qty 0.1

## 2019-11-14 MED ORDER — CHLORHEXIDINE GLUCONATE CLOTH 2 % EX PADS
6.0000 | MEDICATED_PAD | Freq: Every day | CUTANEOUS | Status: DC
  Administered 2019-11-14 – 2019-11-23 (×10): 6 via TOPICAL

## 2019-11-14 MED ORDER — INSULIN ASPART 100 UNIT/ML ~~LOC~~ SOLN
0.0000 [IU] | SUBCUTANEOUS | Status: DC
  Administered 2019-11-14: 2 [IU] via SUBCUTANEOUS
  Administered 2019-11-14: 3 [IU] via SUBCUTANEOUS
  Administered 2019-11-14 – 2019-11-15 (×4): 2 [IU] via SUBCUTANEOUS
  Administered 2019-11-15: 8 [IU] via SUBCUTANEOUS
  Administered 2019-11-15 (×2): 2 [IU] via SUBCUTANEOUS
  Administered 2019-11-16 (×2): 4 [IU] via SUBCUTANEOUS
  Administered 2019-11-16 (×2): 2 [IU] via SUBCUTANEOUS
  Administered 2019-11-16: 8 [IU] via SUBCUTANEOUS
  Administered 2019-11-17: 4 [IU] via SUBCUTANEOUS
  Administered 2019-11-17: 8 [IU] via SUBCUTANEOUS
  Administered 2019-11-17: 4 [IU] via SUBCUTANEOUS
  Administered 2019-11-18 (×2): 8 [IU] via SUBCUTANEOUS
  Administered 2019-11-18: 12 [IU] via SUBCUTANEOUS
  Administered 2019-11-18: 4 [IU] via SUBCUTANEOUS
  Administered 2019-11-18 – 2019-11-19 (×3): 8 [IU] via SUBCUTANEOUS
  Administered 2019-11-19: 12 [IU] via SUBCUTANEOUS
  Administered 2019-11-19: 8 [IU] via SUBCUTANEOUS
  Administered 2019-11-19: 4 [IU] via SUBCUTANEOUS

## 2019-11-14 MED ORDER — AMIODARONE HCL 200 MG PO TABS
200.0000 mg | ORAL_TABLET | Freq: Every day | ORAL | Status: DC
  Administered 2019-11-14 – 2019-11-16 (×3): 200 mg
  Filled 2019-11-14 (×3): qty 1

## 2019-11-14 MED ORDER — DIATRIZOATE MEGLUMINE & SODIUM 66-10 % PO SOLN
ORAL | Status: AC
  Filled 2019-11-14: qty 120

## 2019-11-14 MED ORDER — PIPERACILLIN-TAZOBACTAM 3.375 G IVPB
3.3750 g | Freq: Three times a day (TID) | INTRAVENOUS | Status: DC
  Administered 2019-11-14 – 2019-11-22 (×24): 3.375 g via INTRAVENOUS
  Filled 2019-11-14 (×23): qty 50

## 2019-11-14 MED ORDER — ONDANSETRON HCL 4 MG/2ML IJ SOLN
4.0000 mg | INTRAMUSCULAR | Status: DC | PRN
  Administered 2019-11-15: 4 mg via INTRAVENOUS
  Filled 2019-11-14: qty 2

## 2019-11-14 MED ORDER — ACETAMINOPHEN 160 MG/5ML PO SOLN
325.0000 mg | ORAL | Status: DC | PRN
  Administered 2019-11-15 – 2019-11-18 (×6): 650 mg
  Filled 2019-11-14 (×6): qty 20.3

## 2019-11-14 MED ORDER — KCL IN DEXTROSE-NACL 20-5-0.45 MEQ/L-%-% IV SOLN
INTRAVENOUS | Status: DC
  Filled 2019-11-14 (×11): qty 1000

## 2019-11-14 MED ORDER — FENTANYL CITRATE (PF) 100 MCG/2ML IJ SOLN
100.0000 ug | Freq: Once | INTRAMUSCULAR | Status: AC
  Administered 2019-11-14: 100 ug via INTRAVENOUS
  Filled 2019-11-14: qty 2

## 2019-11-14 MED ORDER — OSMOLITE 1.2 CAL PO LIQD
1000.0000 mL | ORAL | Status: DC
  Administered 2019-11-15: 1000 mL
  Filled 2019-11-14: qty 1000

## 2019-11-14 MED ORDER — CHLORHEXIDINE GLUCONATE 0.12 % MT SOLN
15.0000 mL | OROMUCOSAL | Status: AC
  Administered 2019-11-14: 15 mL via OROMUCOSAL
  Filled 2019-11-14: qty 15

## 2019-11-14 MED ORDER — OSMOLITE 1.2 CAL PO LIQD
1000.0000 mL | ORAL | Status: DC
  Administered 2019-11-14: 1000 mL
  Filled 2019-11-14 (×2): qty 1000

## 2019-11-14 MED ORDER — PANTOPRAZOLE SODIUM 40 MG IV SOLR
40.0000 mg | INTRAVENOUS | Status: DC
  Administered 2019-11-14 – 2019-11-21 (×8): 40 mg via INTRAVENOUS
  Filled 2019-11-14 (×8): qty 40

## 2019-11-14 MED ORDER — OXYCODONE HCL 5 MG/5ML PO SOLN
5.0000 mg | ORAL | Status: DC | PRN
  Administered 2019-11-15: 5 mg
  Filled 2019-11-14: qty 5

## 2019-11-14 MED ORDER — MORPHINE SULFATE (PF) 2 MG/ML IV SOLN: 1.0000 mg | INTRAVENOUS | Status: DC | PRN

## 2019-11-14 MED ORDER — ENOXAPARIN SODIUM 40 MG/0.4ML ~~LOC~~ SOLN
40.0000 mg | Freq: Every day | SUBCUTANEOUS | Status: DC
  Administered 2019-11-15 – 2019-11-23 (×9): 40 mg via SUBCUTANEOUS
  Filled 2019-11-14 (×10): qty 0.4

## 2019-11-14 MED ORDER — INSULIN ASPART 100 UNIT/ML ~~LOC~~ SOLN
0.0000 [IU] | SUBCUTANEOUS | Status: DC
  Administered 2019-11-16: 2 [IU] via SUBCUTANEOUS
  Administered 2019-11-19: 8 [IU] via SUBCUTANEOUS
  Administered 2019-11-20: 4 [IU] via SUBCUTANEOUS
  Administered 2019-11-20: 12 [IU] via SUBCUTANEOUS
  Administered 2019-11-20: 2 [IU] via SUBCUTANEOUS
  Administered 2019-11-20: 12 [IU] via SUBCUTANEOUS
  Administered 2019-11-20: 8 [IU] via SUBCUTANEOUS
  Administered 2019-11-20: 12 [IU] via SUBCUTANEOUS
  Administered 2019-11-21 (×2): 8 [IU] via SUBCUTANEOUS
  Administered 2019-11-21 (×2): 12 [IU] via SUBCUTANEOUS
  Administered 2019-11-21: 8 [IU] via SUBCUTANEOUS
  Administered 2019-11-21: 12 [IU] via SUBCUTANEOUS
  Administered 2019-11-22 (×2): 8 [IU] via SUBCUTANEOUS
  Administered 2019-11-22: 4 [IU] via SUBCUTANEOUS
  Administered 2019-11-22: 12 [IU] via SUBCUTANEOUS
  Administered 2019-11-22: 8 [IU] via SUBCUTANEOUS
  Administered 2019-11-22: 12 [IU] via SUBCUTANEOUS
  Administered 2019-11-23: 8 [IU] via SUBCUTANEOUS
  Administered 2019-11-23: 4 [IU] via SUBCUTANEOUS
  Administered 2019-11-23 (×3): 8 [IU] via SUBCUTANEOUS

## 2019-11-14 MED ORDER — AMIODARONE PEDIATRIC ORAL SUSPENSION 5 MG/ML
200.0000 mg | Freq: Every day | ORAL | Status: DC
  Filled 2019-11-14: qty 40

## 2019-11-14 NOTE — H&P (Addendum)
[address]       [place] [address]             [phone]                    Danarius R Micallef [place] Medical Record #[id number] Date of Birth: 1960-05-17  Referring: No ref. provider found Primary Care: Erven Colla, DO Primary Cardiologist: Kate Sable, MD (Inactive)  Chief Complaint:    Chief Complaint  Patient presents with  . Chest Pain  . 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   History of Present Illness:    AUL MANGIERI 59 y.o. male is status post Ivor Lewis esophagectomy with pyloromyotomy on October 18, 2019.  He had undergone preoperative radiation chemotherapy progressed well after surgery and was discharged home on jejunal tube feedings and p.o. diet.  He noted last week having urinary tract symptoms and was treated with 3 days of antibiotics for presumed UTI.  3 days ago he noted having increasing bloating vomited once.  Yesterday noted increasing epigastric and back pain sometimes when walking.  He was evaluated with Gastrografin swallow-unfortunate there was some malfunction and there are no films available, CT of the chest after the Gastrografin swallow shows contained leak just below the level of the carina.  Patient denies any fever chills, he was transferred from [facility] and admitted.  He has been started on IV antibiotics n.p.o. and will resume tube feedings.  Patient does not appear toxic or septic.  Feels better currently even without any pain  medicine administered.    Current Activity/ Functional Status:  Patient is independent with mobility/ambulation, transfers, ADL's, IADL's.   Zubrod Score: At the time of surgery this patient's most appropriate activity status/level should be described as: []     0    Normal activity, no symptoms [x]     1    Restricted in physical strenuous activity but ambulatory, able to do out light work []     2    Ambulatory and capable of self care, unable to do work activities, up and about               >50 % of waking hours                              []     3    Only limited self care, in bed greater than 50% of waking hours []     4    Completely disabled, no self care, confined to bed or chair []     5    Moribund   Past Medical  History:  Diagnosis Date  . Anxiety   . Arthritis    "hands, back, knees" (08/27/2013)  . Asthma   . CHF (congestive heart failure) ([place]) 02/2010  . Chronic bronchitis ([place])    "get it q year"  . Chronic lower back pain   . Closed head injury 1975  . Coronary artery disease May 2015   s/p successful PTCA/DES x 1 to distal RCA and PTCA/DES x 1 to OM-20 Aug 2013  . Depression   . DVT (deep venous thrombosis) ([place]) 2008   "LLE"  . Esophageal cancer ([place])   . GERD (gastroesophageal reflux disease)   . Headache    hs of but no longer has problems   . History of kidney stones   . Hyperlipemia   . Hypertension   . Hypertriglyceridemia   . LV dysfunction    LVEF 40% at time of cardiac cath May 2015  . Morbid obesity ([place])   . Myocardial infarction ([place]) 02/2010  . Psoriasis   . Type II diabetes mellitus ([place])     Past Surgical History:  Procedure Laterality Date  . BIOPSY  06/17/2019   Procedure: BIOPSY;  Surgeon: [name], MD;  [facility];  Service: Endoscopy;;  . COLONOSCOPY  01/06/2012   Dr. [name]: suboptimal prep. normal exam. next colonoscopy in 10 years.   . CORONARY ANGIOPLASTY WITH STENT PLACEMENT  02/2010; 08/27/2013   "1 + 3"    . ESOPHAGOGASTRODUODENOSCOPY N/A 10/18/2019   Procedure: ESOPHAGOGASTRODUODENOSCOPY (EGD);  Surgeon: Lajuana Matte, MD;  Location: [facility] OR;  Service: Thoracic;  Laterality: N/A;  . ESOPHAGOGASTRODUODENOSCOPY (EGD) WITH PROPOFOL N/A 06/17/2019   Procedure: ESOPHAGOGASTRODUODENOSCOPY (EGD) WITH PROPOFOL;  Surgeon: [name], MD;  [facility];  Service: Endoscopy;  Laterality: N/A;  11:15am  . INTERCOSTAL NERVE BLOCK  10/18/2019   Procedure: INTERCOSTAL NERVE BLOCK;  Surgeon: Lajuana Matte, MD;  Location: [place];  Service: Thoracic;;  . IR IMAGING GUIDED PORT INSERTION  08/03/2019  . KNEE ARTHROSCOPY Left 1981  . LEFT HEART CATHETERIZATION WITH CORONARY ANGIOGRAM N/A 08/27/2013   Procedure: LEFT HEART CATHETERIZATION WITH CORONARY ANGIOGRAM;  Surgeon: [name], MD;  Location: [facility] CATH LAB;  Service: Cardiovascular;  Laterality: N/A;  . NODE DISSECTION  10/18/2019   Procedure: NODE DISSECTION;  Surgeon: Lajuana Matte, MD;  Location: [place];  Service: Thoracic;;  . right wrist fracture surgery     . TIBIA IM NAIL INSERTION Left 08/21/2018   Procedure: INTRAMEDULLARY (IM) NAIL TIBIAL;  Surgeon: Rod Can, MD;  Location: [facility];  Service: Orthopedics;  Laterality: Left;    Family History  Problem Relation Age of Onset  . Coronary artery disease Father   . Diabetes type II Father   . Diabetes Father   . Heart attack Father   . Hypertension Mother      Social History   Tobacco Use  Smoking Status Never Smoker  Smokeless Tobacco Never Used  Tobacco Comment   "started dipping @ age 28; quit for 5 1/2 yrs at one time; hadn't chewed in awhile"    Social History   Substance and Sexual Activity  Alcohol Use Not Currently  . Alcohol/week: 0.0 standard drinks   Comment: occ      Allergies  Allergen Reactions  . Adhesive [Tape] Rash    Current Facility-Administered Medications  Medication Dose Route Frequency Provider Last Rate Last Admin   . oxyCODONE (ROXICODONE) 5 MG/5ML solution 5-10 mg  5-10 mg Per Tube Q4H PRN Grace Isaac, MD       And  . acetaminophen (TYLENOL) 160 MG/5ML solution 325-650 mg  325-650 mg Per Tube Q4H PRN Grace Isaac, MD      . chlorhexidine (PERIDEX) 0.12 % solution 15 mL  15 mL Mouth/Throat NOW Lanelle Bal B, MD      . dextrose 5 % and 0.45 % NaCl with KCl 20 mEq/L infusion   Intravenous Continuous Grace Isaac, MD      . Derrill Memo ON 11/15/2019] enoxaparin (LOVENOX) injection 40 mg  40 mg Subcutaneous Daily Grace Isaac, MD      . feeding supplement (OSMOLITE 1.2 CAL) liquid 1,000 mL  1,000 mL Per Tube Q24H Grace Isaac, MD      . insulin aspart (novoLOG) injection 0-24 Units  0-24 Units Subcutaneous Q4H Grace Isaac, MD      . ondansetron [facility]) injection 4 mg  4 mg Intravenous Q4H PRN Grace Isaac, MD      . pantoprazole (PROTONIX) injection 40 mg  40 mg Intravenous Q24H Grace Isaac, MD      . piperacillin-tazobactam (ZOSYN) IVPB 3.375 g  3.375 g Intravenous Q8H Grace Isaac, MD          Review of Systems:     Cardiac Review of Systems: [Y] = yes  or   [ N ] = no   Chest Pain Blue.Reese    ]  Resting SOB Florencio.Farrier   ] Exertional SOB  [n  ]  Vertell Limber Florencio.Farrier ]   Pedal Edema Florencio.Farrier   ]    Palpitations Florencio.Farrier  ] Syncope  Florencio.Farrier  ]   Presyncope Florencio.Farrier   ]   General Review of Systems: [Y] = yes [  ]=no Constitional: recent weight change [  ];  Wt loss over the last 3 months [   ] anorexia [ y ]; fatigue Blue.Reese  ]; nausea [ y ]; night sweats [n  ]; fever [ n ]; or chills [  ];           Eye : blurred vision [  ]; diplopia [   ]; vision changes [  ];  Amaurosis fugax[  ]; Resp: cough [  ];  wheezing[  ];  hemoptysis[  ]; shortness of breath[  ]; paroxysmal nocturnal dyspnea[  ]; dyspnea on exertion[  ]; or orthopnea[  ];  GI:  gallstones[  ], vomiting[  ];  dysphagia[ y ]; melena[  ];  hematochezia [n  ]; heartburn[  ];   Hx of  Colonoscopy[y  ]; GU: kidney stones [  ]; hematuria[  ];    dysuria [  ];  nocturia[  ];  history of     obstruction [  ]; urinary frequency Blue.Reese  ]             Skin: rash, swelling[  ];, hair loss[  ];  peripheral edema[  ];  or itching[  ]; Musculosketetal: myalgias[  ];  joint swelling[  ];  joint erythema[  ];  joint pain[  ];  back pain[  ];  Heme/Lymph: bruising[  ];  bleeding[  ];  anemia[  ];  Neuro: TIA[  ];  headaches[  ];  stroke[  ];  vertigo[  ];  seizures[  ];   paresthesias[  ];  difficulty walking[  ];  Psych:depression[  ]; anxiety[  ];  Endocrine: diabetes[  ];  thyroid dysfunction[  ];  Immunizations: Flu up to date [  ]; Pneumococcal up to date [  ];  Other:     PHYSICAL EXAMINATION: BP (!) 132/73   Pulse 72   Temp 98.6 F (37 C) (Oral)   Resp 17   Ht 6\' 2"  (1.88 m)   Wt (!) 101.6 kg   SpO2 97%   BMI 28.76 kg/m  General appearance: alert, cooperative, appears stated age and no distress Head: Normocephalic, without obvious abnormality, atraumatic Neck: no adenopathy, no carotid bruit, no JVD, supple, symmetrical, trachea midline and thyroid not enlarged, symmetric, no tenderness/mass/nodules Lymph nodes: Cervical, supraclavicular, and axillary nodes normal. Resp: diminished breath sounds bibasilar Cardio: regular rate and rhythm, S1, S2 normal, no murmur, click, rub or gallop GI: soft, non-tender; bowel sounds normal; no masses,  no organomegaly Extremities: extremities normal, atraumatic, no cyanosis or edema and Homans sign is negative, no sign of DVT Neurologic: Grossly normal Patient's incisions are healed  J-tube is in place  Diagnostic Studies & Laboratory data:     Recent Radiology Findings:   DG Chest 2 View  Result Date: 10/25/2019 CLINICAL DATA:  Pleural effusion. EXAM: CHEST - 2 VIEW COMPARISON:  10/19/2019 FINDINGS: RIGHT-sided PowerPort tip overlies the superior vena cava. Heart size is normal. There is increased opacity in the retrocardiac region in the LEFT LOWER lobe consistent with pleural effusion  and atelectasis or consolidation. RIGHT lung is clear. There is residual contrast in the bowel loops in the LEFT UPPER QUADRANT. IMPRESSION: Increased opacity in the LEFT LOWER lobe consistent with pleural effusion and atelectasis or consolidation. Electronically Signed   By: Nolon Nations M.D.   On: 10/25/2019 12:07   DG Chest 2 View  Result Date: 10/18/2019 CLINICAL DATA:  Preop. EXAM: CHEST - 2 VIEW COMPARISON:  Chest CT 06/21/2019 FINDINGS: Normal heart size and mediastinal contours. Port with tip at the [facility]. No acute infiltrate or edema. No effusion or pneumothorax. No acute osseous findings. Spondylosis. IMPRESSION: No evidence of active disease. Electronically Signed   By: Monte Fantasia M.D.   On: 10/18/2019 06:25   CT Chest Wo Contrast  Result Date: 11/14/2019 CLINICAL DATA:  Esophageal cancer status post partial esophagectomy and gastric pull-through procedure. Epigastric pain, with evidence of neo esophagus perforation on previous CT. EXAM: CT CHEST WITHOUT CONTRAST TECHNIQUE: Multidetector CT imaging of the chest was performed following the standard protocol without IV contrast. COMPARISON:  11/13/2019 FINDINGS: Cardiovascular: Unenhanced imaging of the heart and great vessels demonstrates no pericardial effusion. Normal caliber of the thoracic aorta. Extensive atherosclerosis of the coronary vessels. Mediastinum/Nodes: Postsurgical changes are again noted from esophagectomy and gastric pull-through. As seen on the previous CT, there is evidence of perforation of the neo esophagus just inferior to the level of the carina. The contained perforation extends along the ventral margin of the descending thoracic aorta. Water-soluble contrast was ingested prior to performance of the CT, and extravasated contrast outlines the contained perforation, which terminates just above the left hemidiaphragm. The remainder of the orally administered contrast outlines the gastric pull-through, and empties into  the proximal small bowel. The thyroid and trachea are unremarkable.  No pathologic adenopathy. Lungs/Pleura: Small bilateral pleural effusions are again noted, right greater than left. Areas of atelectasis are seen within the right lower lobe. No pneumothorax. Upper Abdomen: Stable appearance of the upper abdomen. Oral contrast outlines the distal stomach and proximal small bowel. Musculoskeletal: No acute or destructive bony lesions. Reconstructed images demonstrate no additional findings.  IMPRESSION: 1. Postsurgical changes from esophagectomy and gastric pull-through procedure. 2. 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 Administered oral contrast outlines the contained perforation within the posterior mediastinum, immediately anterior to the descending thoracic aorta. 3. Stable bilateral pleural effusions and basilar atelectasis. These results were called by telephone at the time of interpretation on 11/14/2019 at 2:26 am to provider Dr. Christy Gentles, Who verbally acknowledged these results. Electronically Signed   By: Randa Ngo M.D.   On: 11/14/2019 02:27   CT Chest W Contrast  Addendum Date: 11/13/2019   ADDENDUM REPORT: 11/13/2019 22:47 ADDENDUM: Impression 2 should read: Diffuse circumferential thickening of the neoesophagus with a small perforation along the mesial aspect with extraluminal gas and fluid contained by the posterior mediastinum and layering adjacent the descending thoracic aorta. Some mild reactive thickening of the aortic wall may be present but without acute luminal abnormality or clear fistulization. Impression 7 should read: Postsurgical changes along the right lateral chest wall with additional abdominal trocar site to the right of a small fluid and fat containing umbilical hernia. No acute complication at the operative sites. Electronically Signed   By: Lovena Le M.D.   On: 11/13/2019 22:47   Result Date: 11/13/2019  CLINICAL DATA:  Epigastric pain, recent esophageal cancer surgery, robotic assisted Ivor-Lewis esophagectomy performed 10/18/2019 EXAM: CT CHEST, ABDOMEN, AND PELVIS WITH CONTRAST TECHNIQUE: Multidetector CT imaging of the chest, abdomen and pelvis was performed following the standard protocol during bolus administration of intravenous contrast. CONTRAST:  115mL OMNIPAQUE IOHEXOL 300 MG/ML  SOLN COMPARISON:  None. FINDINGS: CT CHEST FINDINGS Cardiovascular: Normal cardiac size. Three-vessel coronary artery calcifications are present. No pericardial effusion. Minimal atherosclerotic plaque in the normal caliber thoracic aorta. Normal 3 vessel branching of the aortic arch without acute luminal abnormality of the proximal great vessels are aorta. Small amount of stranding adjacent the aorta favored to be related to the postsurgical change and complications further detailed in the mediastinal section below. Central pulmonary arteries are normal caliber. No large central filling defects are seen on this non tailored examination of pulmonary arteries. Right IJ approach Port-A-Cath tip terminates at the superior cavoatrial junction. No major venous abnormalities. Mediastinum/Nodes: There are postsurgical changes from recent Ivor-Lewis esophagectomy with tubularization and pull-through of the stomach. There is diffuse circumferential thickening of the esophagus however, a small outpouching is noted along the mesial aspect of the neo esophagus (2/31) with a small amount of extraluminal gas and fluid contained within the retroperitoneum and layering anterior to the aorta. Some mild reactive thickening of the aorta at wall may be present. (2/35) no clear fistulization is seen at this time. No acute abnormality of the trachea. Thyroid gland and thoracic inlet are unremarkable. No worrisome mediastinal, hilar or axillary adenopathy. Lungs/Pleura: Small left and moderate right pleural effusions with adjacent areas of atelectasis  including more bandlike areas of subsegmental atelectasis in the right lower lobe. Underlying airspace disease is not fully excluded this is less favored this time. No pneumothorax. No other consolidative opacities. Musculoskeletal: Multilevel degenerative changes are present in the imaged portions of the spine. Multilevel flowing anterior osteophytosis, compatible with features of diffuse idiopathic skeletal hyperostosis (DISH). Postsurgical changes along the right lateral chest wall compatible with recent robotic assisted thoracotomy no visible displaced rib fractures. No acute or worrisome osseous lesions. Mild body wall edema. CT ABDOMEN PELVIS FINDINGS Hepatobiliary: No worrisome focal liver lesions. Smooth liver surface contour. Diffuse hepatic hypoattenuation compatible with  hepatic steatosis. Sparing along the gallbladder fossa. High attenuation material layering dependently within gallbladder, could reflect small amount of biliary sludge. No pericholecystic fluid or inflammation. No biliary ductal dilatation. Pancreas: Partial fatty replacement of the pancreas. No pancreatic ductal dilatation or surrounding inflammatory changes. Spleen: Normal in size. No concerning splenic lesions. Adrenals/Urinary Tract: Normal adrenal glands. Asymmetric delayed left nephrogram with some asymmetrically increased left perinephric stranding. No concerning renal masses. No obstructive urolithiasis or hydronephrosis. Few scattered nonobstructing calculi in both kidneys. Bladder is decompressed mild circumferential bladder wall thickening perivesicular haze is present however. Stomach/Bowel: Postsurgical changes of the neo esophagus with potential perforation in the posterior mediastinum, as detailed above. Stable line extending through the diaphragmatic hiatus in the upper abdomen appears intact. Duodenum with a normal course across the midline abdomen. No frank small bowel wall thickening or dilatation. A percutaneous  jejunostomy seen in the left upper quadrant. A normal appendix is visualized. No colonic dilatation or wall thickening. Vascular/Lymphatic: Atherosclerotic calcifications within the abdominal aorta and branch vessels. No aneurysm or ectasia. No enlarged abdominopelvic lymph nodes. 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 Reproductive: The prostate and seminal vesicles are unremarkable. Other: Some additional edematous changes in the central mesentery as well as moderate diffuse body wall edema. Postsurgical changes along the small amount of fluid contained within the umbilical fat containing hernia. Trocar site noted at the level of the umbilicus just to the right of midline. No acute abnormality of the trocar site. Musculoskeletal: Multilevel degenerative changes are present in the imaged portions of the spine. Levocurvature of the lumbar spine apex L4. Partial ankylosis across the right SI joint. Additional moderate degenerative changes in the bilateral hips. IMPRESSION: 1. Postsurgical changes from recent Ivor-Lewis esophagectomy with tubularization and pull-through of the stomach. 2. Diffuse circumferential thickening of the neoesophagus with a small outpouching along the mesial aspect with a small amount of extraluminal gas and fluid contained within the retroperitoneum and layering adjacent the descending thoracic aorta. Some mild reactive thickening of the aorta at wall may be present. No clear fistulization is seen at this time. 3. Small left and moderate right pleural effusions with adjacent areas of atelectasis including more bandlike areas of subsegmental atelectasis in the right lower lobe. Underlying airspace disease is not fully excluded though less favored. 4. Asymmetric delayed left nephrogram with some asymmetrically increased left perinephric stranding. Findings  are concerning for ascending urinary tract infection. Recommend correlation with urinalysis. 5. Additional bilateral nonobstructing nephrolithiasis. 6. Hepatic steatosis. 7. Postsurgical changes along the small amount of fluid contained within the umbilical fat containing hernia. No acute abnormality of the trocar site. 8. Aortic Atherosclerosis (ICD10-I70.0). These results were called by telephone at the time of interpretation on 11/13/2019 at 10:39 pm to provider Evalee Jefferson, who verbally acknowledged these results. Electronically Signed: By: Lovena Le M.D. On: 11/13/2019 22:41   CT ABDOMEN PELVIS W CONTRAST  Addendum Date: 11/13/2019   ADDENDUM REPORT: 11/13/2019 22:47 ADDENDUM: Impression 2 should read: Diffuse circumferential thickening of the neoesophagus with a small perforation along the mesial aspect with extraluminal gas and fluid contained by the posterior mediastinum and layering adjacent the descending thoracic aorta. Some mild reactive thickening of the aortic wall may be present but without acute luminal abnormality or clear fistulization. Impression 7 should read: Postsurgical changes along the right lateral chest wall with additional abdominal trocar site to the right of  a small fluid and fat containing umbilical hernia. No acute complication at the operative sites. Electronically Signed   By: Lovena Le M.D.   On: 11/13/2019 22:47   Result Date: 11/13/2019 CLINICAL DATA:  Epigastric pain, recent esophageal cancer surgery, robotic assisted Ivor-Lewis esophagectomy performed 10/18/2019 EXAM: CT CHEST, ABDOMEN, AND PELVIS WITH CONTRAST TECHNIQUE: Multidetector CT imaging of the chest, abdomen and pelvis was performed following the standard protocol during bolus administration of intravenous contrast. CONTRAST:  113mL OMNIPAQUE IOHEXOL 300 MG/ML  SOLN COMPARISON:  None. FINDINGS: CT CHEST FINDINGS Cardiovascular: Normal cardiac size. Three-vessel coronary artery calcifications are present. No  pericardial effusion. Minimal atherosclerotic plaque in the normal caliber thoracic aorta. Normal 3 vessel branching of the aortic arch without acute luminal abnormality of the proximal great vessels are aorta. Small amount of stranding adjacent the aorta favored to be related to the postsurgical change and complications further detailed in the mediastinal section below. Central pulmonary arteries are normal caliber. No large central filling defects are seen on this non tailored examination of pulmonary arteries. Right IJ approach Port-A-Cath tip terminates at the superior cavoatrial junction. No major venous abnormalities. Mediastinum/Nodes: There are postsurgical changes from recent Ivor-Lewis esophagectomy with tubularization and pull-through of the stomach. There is diffuse circumferential thickening of the esophagus however, a small outpouching is noted along the mesial aspect of the neo esophagus (2/31) with a small amount of extraluminal gas and fluid contained within the retroperitoneum and layering anterior to the aorta. Some mild reactive thickening of the aorta at wall may be present. (2/35) no clear fistulization is seen at this time. No acute abnormality of the trachea. Thyroid gland and thoracic inlet are unremarkable. No worrisome mediastinal, hilar or axillary adenopathy. Lungs/Pleura: Small left and moderate right pleural effusions with adjacent areas of atelectasis including more bandlike areas of subsegmental atelectasis in the right lower lobe. Underlying airspace disease is not fully excluded this is less favored this time. No pneumothorax. No other consolidative opacities. Musculoskeletal: Multilevel degenerative changes are present in the imaged portions of the spine. Multilevel flowing anterior osteophytosis, compatible with features of diffuse idiopathic skeletal hyperostosis (DISH). Postsurgical changes along the right lateral chest wall compatible with recent robotic assisted thoracotomy  no visible displaced rib fractures. No acute or worrisome osseous lesions. Mild body wall edema. CT ABDOMEN PELVIS FINDINGS Hepatobiliary: No worrisome focal liver lesions. Smooth liver surface contour. Diffuse hepatic hypoattenuation compatible with hepatic steatosis. Sparing along the gallbladder fossa. High attenuation material layering dependently within gallbladder, could reflect small amount of biliary sludge. No pericholecystic fluid or inflammation. No biliary ductal dilatation. Pancreas: Partial fatty replacement of the pancreas. No pancreatic ductal dilatation or surrounding inflammatory changes. Spleen: Normal in size. No concerning splenic lesions. Adrenals/Urinary Tract: Normal adrenal glands. Asymmetric delayed left nephrogram with some asymmetrically increased left perinephric stranding. No concerning renal masses. No obstructive urolithiasis or hydronephrosis. Few scattered nonobstructing calculi in both kidneys. Bladder is decompressed mild circumferential bladder wall thickening perivesicular haze is present however. Stomach/Bowel: Postsurgical changes of the neo esophagus with potential perforation in the posterior mediastinum, as detailed above. Stable line extending through the diaphragmatic hiatus in the upper abdomen appears intact. Duodenum with a normal course across the midline abdomen. No frank small bowel wall thickening or dilatation. A percutaneous jejunostomy seen in the left upper quadrant. A normal appendix is visualized. No colonic dilatation or wall thickening. Vascular/Lymphatic: Atherosclerotic calcifications within the abdominal aorta and branch vessels. No aneurysm or ectasia. No enlarged abdominopelvic lymph nodes. 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 Reproductive: The prostate and seminal vesicles are unremarkable. Other: Some additional  edematous changes in the central mesentery as well as moderate diffuse body wall edema. Postsurgical changes along the small amount of fluid contained within the umbilical fat containing hernia. Trocar site noted at the level of the umbilicus just to the right of midline. No acute abnormality of the trocar site. Musculoskeletal: Multilevel degenerative changes are present in the imaged portions of the spine. Levocurvature of the lumbar spine apex L4. Partial ankylosis across the right SI joint. Additional moderate degenerative changes in the bilateral hips. IMPRESSION: 1. Postsurgical changes from recent Ivor-Lewis esophagectomy with tubularization and pull-through of the stomach. 2. Diffuse circumferential thickening of the neoesophagus with a small outpouching along the mesial aspect with a small amount of extraluminal gas and fluid contained within the retroperitoneum and layering adjacent the descending thoracic aorta. Some mild reactive thickening of the aorta at wall may be present. No clear fistulization is seen at this time. 3. Small left and moderate right pleural effusions with adjacent areas of atelectasis including more bandlike areas of subsegmental atelectasis in the right lower lobe. Underlying airspace disease is not fully excluded though less favored. 4. Asymmetric delayed left nephrogram with some asymmetrically increased left perinephric stranding. Findings are concerning for ascending urinary tract infection. Recommend correlation with urinalysis. 5. Additional bilateral nonobstructing nephrolithiasis. 6. Hepatic steatosis. 7. Postsurgical changes along the small amount of fluid contained within the umbilical fat containing hernia. No acute abnormality of the trocar site. 8. Aortic Atherosclerosis (ICD10-I70.0). These results were called by telephone at the time of interpretation on 11/13/2019 at 10:39 pm to provider Evalee Jefferson, who verbally acknowledged these results. Electronically Signed: By:  Lovena Le M.D. On: 11/13/2019 22:41   DG Chest Port 1 View  Result Date: 11/13/2019 CLINICAL DATA:  Chest pain EXAM: PORTABLE CHEST 1 VIEW COMPARISON:  October 25, 2019 FINDINGS: There is a stable right-sided Port-A-Cath. The heart size is unchanged parent there are probable trace bilateral pleural effusions, right greater than left. There is no pneumothorax. No focal infiltrate. There is no acute osseous abnormality. There are gas distended loops of bowel in the upper abdomen. IMPRESSION: 1. Probable trace bilateral pleural effusions, right greater than left. 2. Gas distended loops of bowel in the upper abdomen. Electronically Signed   By: Constance Holster M.D.   On: 11/13/2019 21:22   DG CHEST PORT 1 VIEW  Result Date: 10/20/2019 CLINICAL DATA:  Right-sided chest tube. EXAM: PORTABLE CHEST 1 VIEW COMPARISON:  10/19/2019 FINDINGS: The right chest tube is stable. No pneumothorax identified. The NG tube and right IJ power ports are stable. The cardiac silhouette, mediastinal and hilar contours are unchanged. Persistent patchy left basilar atelectasis and possible small left effusion. IMPRESSION: 1. Stable support apparatus. 2. No right-sided pneumothorax. 3. Persistent patchy left basilar atelectasis and possible small left effusion. Electronically Signed   By: Marijo Sanes M.D.   On: 10/20/2019 08:21   DG CHEST PORT 1 VIEW  Result Date: 10/19/2019 CLINICAL DATA:  Follow-up right chest tube EXAM: PORTABLE CHEST 1 VIEW COMPARISON:  10/18/2019 FINDINGS: Cardiac shadow is stable. Gastric catheter remains in the stomach. Right chest wall port is again seen. Right chest tube is also seen. No definitive pneumothorax is noted. Atelectasis is again noted in the left base although the overall degree of aeration is improved  from the prior study. Previously seen free air is less well visualized. IMPRESSION: Tubes and lines as described above. Improved left basilar atelectasis. Electronically Signed   By: Inez Catalina M.D.   On: 10/19/2019 08:17   DG Chest Port 1 View  Result Date: 10/18/2019 CLINICAL DATA:  NG placement, postop esophageal surgery EXAM: PORTABLE CHEST 1 VIEW COMPARISON:  PET CT 10/01/2019 FINDINGS: Right-sided central venous port tip over the SVC. Esophageal tube tip overlies the mid gastric region, side-port in expected location of GE junction. Small amount of free air beneath the right diaphragm is presumably postoperative. Mild airspace disease at the left base likely reflects atelectasis. No pleural effusion or pneumothorax. Normal heart size. IMPRESSION: Esophageal tube tip overlies the mid gastric region, side-port in expected location of GE junction. Small amount of free air beneath the right diaphragm is presumably postoperative in nature. Airspace disease at the left base, favor atelectasis. Electronically Signed   By: Donavan Foil M.D.   On: 10/18/2019 19:18   DG ESOPHAGUS W SINGLE CM (SOL OR THIN BA)  Result Date: 11/14/2019 CLINICAL DATA:  Esophageal cancer with gastric pull-through, evidence of perforation on previous CT EXAM: ESOPHOGRAM/BARIUM SWALLOW TECHNIQUE: Single contrast examination was performed using water-soluble contrast. FLUOROSCOPY TIME:  Fluoroscopy Time:  27 seconds Radiation Exposure Index (if provided by the fluoroscopic device): 19.87 mGy Number of Acquired Spot Images: 0 COMPARISON:  11/13/2019 FINDINGS: Under fluoroscopic visualization, the patient ingested water-soluble contrast to evaluate the neo esophagus perforation seen on prior CT. Because of equipment malfunction, the fluoroscopic images were not saved and no images could be submitted to PACS. During real-time evaluation, the water-soluble contrast could be seen outlining the gastric pull-through, with extravasation into the posterior mediastinum corresponding to the contained perforation seen on previous CT. A subsequent CT scan was ordered by the emergency room, clearly outlining the contained  perforation in the posterior mediastinum ventral to the descending thoracic aorta. IMPRESSION: 1. Single-contrast esophagram with water-soluble contrast demonstrating contained perforation of the gastric pull-through procedure. Because of archive malfunction, fluoroscopic images were not saved. Subsequent CT scan was performed demonstrating extravasated oral contrast outlining the contained perforation in the posterior mediastinum. These results were called by telephone at the time of interpretation on 11/14/2019 at 2:26 am to provider Dr. Christy Gentles, Who verbally acknowledged these results. Electronically Signed   By: Randa Ngo M.D.   On: 11/14/2019 02:26   DG ESOPHAGUS W SINGLE CM (SOL OR THIN BA)  Result Date: 10/22/2019 CLINICAL DATA:  Status post esophagectomy on June 28. History of radiation therapy. EXAM: WATER SOLUBLE CONTRAST ENEMA TECHNIQUE: Real-time fluoroscopy was utilized for evaluation of the esophagus using Omnipaque 300 and thin barium. FLUOROSCOPY TIME:  Fluoroscopy Time:  1 minutes Radiation Exposure Index (if provided by the fluoroscopic device): 4 Number of Acquired Spot Images: 20.1 mGy COMPARISON:  None. FINDINGS: Normal pharyngeal anatomy and motility. Contrast flowed freely through the esophagus and the esophagogastric anastomosis without evidence of a stricture. No extraluminal contrast to suggest a leak. No irregularity or ulceration. Esophageal motility was normal. IMPRESSION: 1. Status post distal esophagectomy with esophagogastric anastomosis. No extraluminal contrast does suggest a leak. Electronically Signed   By: Kathreen Devoid   On: 10/22/2019 10:31     I have independently reviewed the above radiology studies  and reviewed the findings with the patient.   Recent Lab Findings: Lab Results  Component Value Date   WBC 14.1 (H) 11/13/2019   HGB 11.2 (L) 11/13/2019  HCT 33.4 (L) 11/13/2019   PLT 329 11/13/2019   GLUCOSE 107 (H) 11/13/2019   CHOL 117 07/22/2019   TRIG  115 07/22/2019   HDL 34 (L) 07/22/2019   LDLCALC 62 07/22/2019   ALT 16 11/13/2019   AST 15 11/13/2019   NA 131 (L) 11/13/2019   K 3.6 11/13/2019   CL 94 (L) 11/13/2019   CREATININE 0.62 11/13/2019   BUN 9 11/13/2019   CO2 26 11/13/2019   TSH 1.792 09/29/2019   INR 1.1 10/14/2019   HGBA1C 7.8 (H) 09/21/2019    Path: FINAL MICROSCOPIC DIAGNOSIS:   A. LYMPH NODE, CELIAC, EXCISION:  - Benign fibroadipose tissue  - Lymph node is not identified   B. LYMPH NODE, LEVEL 7, EXCISION:  - Lymph node, negative for carcinoma (0/1)   C. LYMPH NODE, LEVEL 7, #2, EXCISION:  - Lymph node, negative for carcinoma (0/1)   D. ESOPHAGUS, PROXIMAL MARGIN, RESECTION:  - Negative for carcinoma   E. ESOPHAGUS, DISTAL MARGIN, RESECTION:  - Negative for carcinoma   F. ESOPHAGOGASTRECTOMY, RESECTION:  - No evidence of residual carcinoma (complete therapeutic response).  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 resume tube feedings, cover with antibiotics consider esophageal stent versus observation If develops signs or symptoms of extending infection may require mediastinal exploration. #3  Atrial fibrillation intermittent-the patient was discharged home on amiodarone related to postop atrial-he developed recurrent atrial fib as I was seeing him with controlled rate-amiodarone per feeding tube will be resumed-we will have to address anticoagulation for recurrent A. fib but with current admission situation will not start that now.  #4 diabetes on insulin and Metformin at home-as we resume tube feedings  will need to adjust long-acting and sliding scale insulin  Grace Isaac MD      [address] [place] [address] Office [phone]     11/14/2019 7:34 AM

## 2019-11-14 NOTE — ED Provider Notes (Signed)
Discussed the case with radiology.  Patient appears to have perforation by CT imaging.  Discussed the case with Dr. Servando Snare who will admit to the ICU.  We will keep patient n.p.o.  He has already received IV antibiotics.   Ripley Fraise, MD 11/14/19 [phone]

## 2019-11-14 NOTE — ED Provider Notes (Signed)
I assumed care of patient after transfer from [facility].  Patient underwent recent esophagectomy, now reporting chest pain moves into his neck.  The plan after discussion with cardiothoracic surgery, is for patient to have a Gastrografin study, with a CT chest without IV contrast immediately following the swallow study.  I have confirmed this with our radiology team. Patient is awake/alert at this time.  His vital signs are appropriate Once imaging is complete I will discuss the case with cardiothoracic surgery BP (!) 160/85 (BP Location: Left Arm)   Pulse 80   Temp 98.6 F (37 C) (Oral)   Resp 19   Ht 1.88 m (6\' 2" )   Wt (!) 106.6 kg   SpO2 99%   BMI 30.17 kg/m     Ripley Fraise, MD 11/14/19 [phone]

## 2019-11-14 NOTE — ED Triage Notes (Addendum)
Pt arrives via [facility] for evaluation at [facility] for torn esophagus per [facility] CT results.

## 2019-11-14 NOTE — Progress Notes (Signed)
Patient ID: Steven Crane, male   DOB: 05/17/1960, 59 y.o.   MRN: [id number] EVENING ROUNDS NOTE :     [address]       [place] [address]             [phone]                     Total Length of Stay:  LOS: 0 days  BP (!) 142/83 (BP Location: Left Arm)   Pulse 64   Temp 98.7 F (37.1 C) (Oral)   Resp 18   Ht 6\' 2"  (1.88 m)   Wt (!) 101.6 kg   SpO2 98%   BMI 28.76 kg/m   .Intake/Output      07/24 0701 - 07/25 0700 07/25 0701 - 07/26 0700   P.O. 0 800   I.V. (mL/kg) 462.7 (4.6) 388.6 (3.8)   IV Piggyback 49.5    Total Intake(mL/kg) 512.1 (5) 1188.6 (11.7)   Urine (mL/kg/hr) 275 910 (0.8)   Total Output 275 910   Net +237.1 +278.6          . dextrose 5 % and 0.45 % NaCl with KCl 20 mEq/L Stopped (11/14/19 1742)  . piperacillin-tazobactam (ZOSYN)  IV 3.375 g (11/14/19 1742)     Lab Results  Component Value Date   WBC 11.6 (H) 11/14/2019   HGB 9.9 (L) 11/14/2019   HCT 29.2 (L) 11/14/2019   PLT 297 11/14/2019   GLUCOSE 101 (H) 11/14/2019   CHOL 117 07/22/2019   TRIG 115 07/22/2019   HDL 34 (L) 07/22/2019   LDLCALC 62 07/22/2019   ALT 16 11/13/2019   AST 15 11/13/2019   NA 134 (L) 11/14/2019   K 3.7 11/14/2019   CL 100 11/14/2019   CREATININE 0.56 (L) 11/14/2019   BUN 5 (L) 11/14/2019   CO2 26 11/14/2019   TSH 1.792 09/29/2019   PSA 0.24 01/13/2014   INR 1.1 10/14/2019   HGBA1C 7.8 (H) 09/21/2019   MICROALBUR 0.50 10/15/2013   Has converted back to sinus No evidence of sepsis Tube feeding started-    Grace Isaac MD  Beeper [phone] Office [phone] 11/14/2019 6:49 PM

## 2019-11-15 ENCOUNTER — Inpatient Hospital Stay (HOSPITAL_COMMUNITY): Payer: BC Managed Care – PPO

## 2019-11-15 LAB — GLUCOSE, CAPILLARY
Glucose-Capillary: 122 mg/dL — ABNORMAL HIGH (ref 70–99)
Glucose-Capillary: 139 mg/dL — ABNORMAL HIGH (ref 70–99)
Glucose-Capillary: 141 mg/dL — ABNORMAL HIGH (ref 70–99)
Glucose-Capillary: 152 mg/dL — ABNORMAL HIGH (ref 70–99)
Glucose-Capillary: 152 mg/dL — ABNORMAL HIGH (ref 70–99)
Glucose-Capillary: 153 mg/dL — ABNORMAL HIGH (ref 70–99)
Glucose-Capillary: 210 mg/dL — ABNORMAL HIGH (ref 70–99)

## 2019-11-15 LAB — CBC
HCT: 28.2 % — ABNORMAL LOW (ref 39.0–52.0)
Hemoglobin: 9.2 g/dL — ABNORMAL LOW (ref 13.0–17.0)
MCH: 30.4 pg (ref 26.0–34.0)
MCHC: 32.6 g/dL (ref 30.0–36.0)
MCV: 93.1 fL (ref 80.0–100.0)
Platelets: 284 10*3/uL (ref 150–400)
RBC: 3.03 MIL/uL — ABNORMAL LOW (ref 4.22–5.81)
RDW: 13.2 % (ref 11.5–15.5)
WBC: 10 10*3/uL (ref 4.0–10.5)
nRBC: 0 % (ref 0.0–0.2)

## 2019-11-15 LAB — BASIC METABOLIC PANEL
Anion gap: 6 (ref 5–15)
BUN: 7 mg/dL (ref 6–20)
CO2: 29 mmol/L (ref 22–32)
Calcium: 8.5 mg/dL — ABNORMAL LOW (ref 8.9–10.3)
Chloride: 98 mmol/L (ref 98–111)
Creatinine, Ser: 0.63 mg/dL (ref 0.61–1.24)
GFR calc Af Amer: >60 mL/min (ref >60)
GFR calc non Af Amer: >60 mL/min (ref >60)
Glucose, Bld: 153 mg/dL — ABNORMAL HIGH (ref 70–99)
Potassium: 3.8 mmol/L (ref 3.5–5.1)
Sodium: 133 mmol/L — ABNORMAL LOW (ref 135–145)

## 2019-11-15 MED ORDER — ORAL CARE MOUTH RINSE
15.0000 mL | Freq: Two times a day (BID) | OROMUCOSAL | Status: DC
  Administered 2019-11-15 – 2019-11-20 (×2): 15 mL via OROMUCOSAL

## 2019-11-15 MED ORDER — CHLORHEXIDINE GLUCONATE 0.12 % MT SOLN
15.0000 mL | Freq: Two times a day (BID) | OROMUCOSAL | Status: DC
  Administered 2019-11-15 – 2019-11-21 (×9): 15 mL via OROMUCOSAL
  Filled 2019-11-15 (×13): qty 15

## 2019-11-15 MED ORDER — OSMOLITE 1.5 CAL PO LIQD
1000.0000 mL | ORAL | Status: DC
  Administered 2019-11-15 – 2019-11-19 (×7): 1000 mL
  Filled 2019-11-15 (×5): qty 1000

## 2019-11-15 MED ORDER — PROSOURCE TF PO LIQD
45.0000 mL | Freq: Three times a day (TID) | ORAL | Status: DC
  Administered 2019-11-15 – 2019-11-23 (×25): 45 mL
  Filled 2019-11-15 (×26): qty 45

## 2019-11-15 NOTE — Progress Notes (Addendum)
Initial Nutrition Assessment  DOCUMENTATION CODES:   Severe malnutrition in context of chronic illness  INTERVENTION:   Tube Feeding:  Change to Osmolite 1.5 with goal rate of 70 ml/hr (titration to goal rate per MD) Add ProSourceTF 45 mL TID Provides 2640 kcals, 138 g of protein and 1277 mL of free water  Off IVF and if no fluids by mouth, pt needs 200 mL free water q 6 hours for hydration per tube  Recommend patient continue TF via J-tube post discharge even if diet advanced given continued weight loss, poor po intake with early satiety and continued malnutrition   NUTRITION DIAGNOSIS:   Severe Malnutrition related to chronic illness, cancer and cancer related treatments as evidenced by moderate fat depletion, severe fat depletion, percent weight loss.  GOAL:   Patient will meet greater than or equal to 90% of their needs  MONITOR:   Diet advancement, Labs, Weight trends, TF tolerance  REASON FOR ASSESSMENT:   Consult Enteral/tube feeding initiation and management  ASSESSMENT:   59 yo male admitted with post-op contained esophageal leak with hx of GE junction adenocarcinoma with Ivor-Lewis esophagectomy with pyloromyotomy and J-tube placement on 10/18/19; pt was discharge to home on po diet with J-tube nocturnal TF. Pt also previously underwent chemo and radiation therapy. Additional PMH includes DM   NPO at present . Pt reports that he was NOT discharged home on TF several weeks ago. He was on a po diet only and told to take 5-6 small meals per day plus 3 protein shakes. Pt reports he has been unsuccessful at doing this. Pt complains of early satiety and bloating. Pt reports he would eat 2 eggs and maybe some oatmeal for breakfast and then drink a protein shake but then would be so full he would not be able to eat anything until dinner time. Dinner was small, bites due to continued feeling of fullness. Pt only drinking 1 protein shake per day.   Osmolite 1.2 at 30 ml/hr  ordered today via J-tube; pt reports no GI issues today other than pain at J-tube site.   Current weight 102 kg (224 pounds); weight yesterday 101.6 kg. Weight loss per weight encounters. Pt reports prior to starting chemo and radiation, pt weighed 300 pounds. Records indicated weight of 301 pounds on 05/05/19 and 237 pounds on admission in June. 26% weight loss since Jan, 5.5 % wt loss over the past month.Weight loss percentage may be even more prevalent given pt with mild/moderate edema on exam.   Nutrition focused physical exam on this admission demonstrating worsening muscle wasting and fat loss compared to exam 1 month ago.   Labs: sodium 133 (L) Meds: D5-1/2 NS with KCl at 50 ml/hr, ss novolog, levemir  NUTRITION - FOCUSED PHYSICAL EXAM:    Most Recent Value  Orbital Region Moderate depletion  Upper Arm Region Moderate depletion  Thoracic and Lumbar Region No depletion  Buccal Region Moderate depletion  Temple Region Moderate depletion  Clavicle Bone Region Severe depletion  Clavicle and Acromion Bone Region Severe depletion  Scapular Bone Region Severe depletion  Dorsal Hand Unable to assess  Patellar Region Moderate depletion  Anterior Thigh Region Moderate depletion  Posterior Calf Region Unable to assess  [edema]       Diet Order:   Diet Order            Diet NPO time specified  Diet effective now  EDUCATION NEEDS:   Education needs have been addressed  Skin:  Skin Assessment: Reviewed RN Assessment  Last BM:  7/25  Height:   Ht Readings from Last 1 Encounters:  11/14/19 6\' 2"  (1.88 m)    Weight:   Wt Readings from Last 1 Encounters:  11/15/19 (!) 102 kg     BMI:  Body mass index is 28.87 kg/m.  Estimated Nutritional Needs:   Kcal:  0981-1914  Protein:  125-150 g  Fluid:  >/= 2 L   Kerman Passey MS, RDN, LDN, CNSC Registered Dietitian III Clinical Nutrition RD Pager and On-Call Pager Number Located in [place]

## 2019-11-15 NOTE — Progress Notes (Signed)
Patient in afib rate controlled.  Dr. Kipp Brood paged. Orders to continue amio per tube and monitor given.  RN will continue to monitor.

## 2019-11-15 NOTE — Progress Notes (Signed)
      [address]       [place] [address]             [phone]                      Events: No events _______________________________________________________________ Vitals: BP (!) 134/75   Pulse 72   Temp 99.2 F (37.3 C) (Oral)   Resp 21   Ht 6\' 2"  (1.88 m)   Wt (!) 102 kg   SpO2 96%   BMI 28.87 kg/m   - Neuro: alert NAD  - Cardiovascular: sinus.      - Pulm: EWOB    ABG    Component Value Date/Time   PHART 7.462 (H) 10/19/2019 0510   PCO2ART 33.7 10/19/2019 0510   PO2ART 94.5 10/19/2019 0510   HCO3 23.7 10/19/2019 0510   TCO2 26 10/18/2019 1640   ACIDBASEDEF 3.9 (H) 10/18/2019 1809   O2SAT 98.4 10/19/2019 0510    - Abd: soft - Extremity: trace edema  .Intake/Output      07/25 0701 - 07/26 0700 07/26 0701 - 07/27 0700   P.O. 800    I.V. (mL/kg) 598.1 (5.9)    NG/GT 298.3    IV Piggyback 51.9    Total Intake(mL/kg) 1748.4 (17.1)    Urine (mL/kg/hr) 1485 (0.6)    Total Output 1485    Net +263.4            _______________________________________________________________ Labs: CBC Latest Ref Rng & Units 11/15/2019 11/14/2019 11/13/2019  WBC 4.0 - 10.5 K/uL 10.0 11.6(H) 14.1(H)  Hemoglobin 13.0 - 17.0 g/dL 9.2(L) 9.9(L) 11.2(L)  Hematocrit 39 - 52 % 28.2(L) 29.2(L) 33.4(L)  Platelets 150 - 400 K/uL 284 297 329   CMP Latest Ref Rng & Units 11/15/2019 11/14/2019 11/13/2019  Glucose 70 - 99 mg/dL 153(H) 101(H) 107(H)  BUN 6 - 20 mg/dL 7 5(L) 9  Creatinine 0.61 - 1.24 mg/dL 0.63 0.56(L) 0.62  Sodium 135 - 145 mmol/L 133(L) 134(L) 131(L)  Potassium 3.5 - 5.1 mmol/L 3.8 3.7 3.6  Chloride 98 - 111 mmol/L 98 100 94(L)  CO2 22 - 32 mmol/L 29 26 26   Calcium 8.9 - 10.3 mg/dL 8.5(L) 8.3(L) 8.6(L)  Total Protein 6.5 - 8.1 g/dL - - 7.1  Total Bilirubin 0.3 - 1.2 mg/dL - - 0.5  Alkaline Phos 38 - 126 U/L - - 87  AST 15 - 41 U/L - - 15  ALT 0 - 44 U/L - - 16     CXR: stable  _______________________________________________________________  Assessment and Plan: S/p esophagectomy, presents with possible leak  Neuro: pain controlled CV: sinus Pulm: continue pulm toilet Renal: stable GI: CT scan show possible contained leak in the mediastinum.  WBC nml.  Will continue tube feeds for now. Heme: stable ID: will continue abx Dispo: will transfer to floor  Melodie Bouillon, MD 11/15/2019 7:47 AM

## 2019-11-15 NOTE — Progress Notes (Signed)
Patient states that he had prior pressure injury from recent hospitalization in June.  He has been caring for it with corn starch powder. When RN assessed, right buttock appeared purpled and was painful to the touch. Skin appeared to be mostly unbroken.  Patient requested to clean bottom and put antifungal powder on sore area. RN assisted patient with his request and also placed a new sacral foam. RN assisted patient to the bed and helped him to offload his bottom with a pillow. RN administered pain medication to patient to help with his soreness. RN had another RN come in to assess area as well. Wound looked to both of [place] as a stage 1 (see chart for documentation).

## 2019-11-16 ENCOUNTER — Inpatient Hospital Stay (HOSPITAL_COMMUNITY): Payer: BC Managed Care – PPO

## 2019-11-16 DIAGNOSIS — L8990 Pressure ulcer of unspecified site, unspecified stage: Secondary | ICD-10-CM | POA: Insufficient documentation

## 2019-11-16 LAB — GLUCOSE, CAPILLARY
Glucose-Capillary: 139 mg/dL — ABNORMAL HIGH (ref 70–99)
Glucose-Capillary: 203 mg/dL — ABNORMAL HIGH (ref 70–99)
Glucose-Capillary: 173 mg/dL — ABNORMAL HIGH (ref 70–99)
Glucose-Capillary: 172 mg/dL — ABNORMAL HIGH (ref 70–99)
Glucose-Capillary: 153 mg/dL — ABNORMAL HIGH (ref 70–99)

## 2019-11-16 MED ORDER — METOPROLOL TARTRATE 5 MG/5ML IV SOLN
5.0000 mg | Freq: Four times a day (QID) | INTRAVENOUS | Status: DC
  Administered 2019-11-16 – 2019-11-18 (×8): 5 mg via INTRAVENOUS
  Filled 2019-11-16 (×8): qty 5

## 2019-11-16 MED ORDER — IOHEXOL 300 MG/ML  SOLN
150.0000 mL | Freq: Once | INTRAMUSCULAR | Status: AC | PRN
  Administered 2019-11-16: 50 mL via ORAL

## 2019-11-16 NOTE — Progress Notes (Signed)
Received verbal order from PA increasing  the tube feeding to 19ml/hr. Will have RD assess pt again tomorrow.   Lavenia Atlas, RN

## 2019-11-16 NOTE — Progress Notes (Signed)
Pt states how he feels that sometimes the Osmolite feels like it tries to go up the esophagus.  He does not feel nauseous.  Pt is already on IV Protonix for gastric reflux, but he says it's not helping much.  Sometimes he feels sputum in his throat and is able to cough it up.  It had a thick tan/milky look to it.  No complaints of shortness of breath. Pt currently sitting up in bed at 45 degrees. Will continue to monitor pt.  Lupita Dawn, RN

## 2019-11-16 NOTE — Progress Notes (Addendum)
      [address]       [place] [id number]             [phone]      POD 29: S/p Esophagoscopy - Robotic assisted laparoscopy - Robotic assisted thoracoscopy - Ivor-Lewis esophagectomy - Pyloromyotomy - Laparoscopic jejunostomy tube placement 35F     Intercostal nerve block by Dr. Kipp Brood on 10/18/2019.  Subjective: Patient states he must sit upright as if he does not, "milky like reflux and comes up"  Objective: Vital signs in last 24 hours: Temp:  [97.7 F (36.5 C)-98.4 F (36.9 C)] 98 F (36.7 C) (07/27 0347) Pulse Rate:  [65-96] 84 (07/27 0355) Cardiac Rhythm: Normal sinus rhythm (07/26 1900) Resp:  [14-20] 19 (07/27 0355) BP: (123-143)/(77-86) 143/84 (07/27 0347) SpO2:  [94 %-98 %] 96 % (07/27 0355) Weight:  [102 kg] 102 kg (07/27 0355)      Intake/Output from previous day: 07/26 0701 - 07/27 0700 In: 1629.4 [I.V.:528.4; NG/GT:676; IV Piggyback:125] Out: 740 [Urine:740]   Physical Exam:  Cardiovascular: RRR Pulmonary: Clear to auscultation on the left and diminished right base Abdomen: Soft, non tender, bowel sounds present. Extremities: Mild bilateral lower extremity edema. Wounds: Clean and dry.  No erythema or signs of infection.   Lab Results: CBC: Recent Labs    11/14/19 0803 11/15/19 0224  WBC 11.6* 10.0  HGB 9.9* 9.2*  HCT 29.2* 28.2*  PLT 297 284   BMET:  Recent Labs    11/14/19 0803 11/15/19 0224  NA 134* 133*  K 3.7 3.8  CL 100 98  CO2 26 29  GLUCOSE 101* 153*  BUN 5* 7  CREATININE 0.56* 0.63  CALCIUM 8.3* 8.5*    PT/INR: No results for input(s): LABPROT, INR in the last 72 hours. ABG:  INR: Will add last result for INR, ABG once components are confirmed Will add last 4 CBG results once components are confirmed  Assessment/Plan:  1. CV - Previous intermittent a fib. SR with HR in the 60's this am. On Amiodarone 200 mg daily via tube. 2.  Pulmonary - On room air. 3. GI-NPO, TFs at 45 ml/hr. Will  not advance tube feedings until I discuss with Dr. Kipp Brood. On Protonix IV. 4. DM-CBGs 122/153/139. On Insulin. HGA1C 06/01 7.8. He was on Metformin prior to surgery. He will need follow up after discharge with medical doctor. 5. ID-on Zosyn for delayed post op esophageal leak 6. Anemia-H and H yesterday 9.2 and 28.2  Donielle M ZimmermanPA-C 11/16/2019,7:22 AM [phone]  Agree with above. Patient is scheduled to undergo an esophagram as well as a J-tube study. We will continue to advance tube feeds Will check CBC tomorrow Continue antibiotics.  Johny Pitstick Bary Leriche

## 2019-11-16 NOTE — Discharge Summary (Signed)
Physician Discharge Summary       [address]       [place] [address]             [phone]    Patient ID: Steven Crane MRN: [id number] DOB/AGE: Mar 15, 1961 59 y.o.  Admit date: 11/13/2019 Discharge date: 11/23/2019  Admission Diagnoses: Esophageal perforation  Discharge Diagnoses:  1. Perforated carcinoma of esophagus (HCC) S/p :Esophagoscopy - Robotic assisted laparoscopy - Robotic assisted thoracoscopy - Ivor-Lewis esophagectomy - Pyloromyotomy - Laparoscopic jejunostomy tube placement 55F - Intercostal nerve block 2. History of CHF (congestive heart failure) ([facility]) 3. History of Chronic bronchitis ([place]) 4. History of Chronic lower back pain 5. History of Closed head injury 6. History of Coronary artery disease 7. History of DVT (deep venous thrombosis) LLE  (HCC) 8. History of GERD (gastroesophageal reflux disease) 9. History of History of kidney stones 10. History of Hyperlipemia 11. History of Hypertension 12. History of Morbid obesity ([place]) 13. History of Type II diabetes mellitus ([place]) 14. History of Psoriasis 15. Arthritis-hands, back, knees 16. 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 Servando Snare, MD - assisting   Anesthesia  general EBL:  100 ml Blood Administration: non Specimen:  Proximal and distal margins.  Esophagogastrectomy, celiac lymph nodes, level 7 lymph nodes   Counts:  correct   Indications: 59 yo male with Seiwerts II adencarcinoma of the esophagus at 33-38cm with some involvment of the cardia. He has completed his neoadjuvant therapy, and despite loosing 70lbs, he has been able to regain some weight, and maintain a stable albumin. The risks and benefits of robotic assisted Ivor-Lewis esophagectomy, with J tube has been discussed. He is agreeable to proceed  Findings: Esophageal lesion noted at 35cm on EGD.  No mass noted on retroflexion in the gastric cardia.  Stomach was viable after ligation of the short gastrics, and left gastric arteries.  Good intra-thoracic length.  Two full donuts.    Operative Technique: After the risks, benefits and alternatives were thoroughly discussed, the patient was brought to the operative theatre.  Anesthesia was induced, and the esophagoscope was passed through the oropharynx down to the stomach.  The scope was retroflexed.  On retrograde examination of the esophagus, there was good response to neoadjuvant therapy.  The scope was then parked at 25 cm from the incisors.  The patient was then prepped and draped in normal sterile fashion.  An appropriate surgical pause was performed, and pre-operative antibiotics were dosed accordingly.  We began with a 1 cm incision 15 cm caudad from the xiphoid and slightly  lateral to the umbilicus.  Using an Optiview we entered the peritoneal space.  The abdomen was then insufflated with CO2.  3 other robotic ports were placed to triangulate the hiatus.  Another 12 mm port was placed in place at the level of the umbilicus laterally for an assistant port and another 5 mm trocar was placed in the right lower quadrant for liver retractor.  The patient was then placed in steep reverse Trendelenburg and the liver was elevated to expose the esophageal hiatus.  And then the robot was docked.  We began by dividing the gastrohepatic ligament to expose the right diaphragmatic crus and then dissected  the esophagus into the mediastinum.  We then divided the short gastrics and moved towards the right crus and completed our dissection along the esophageal hiatus.  A Penrose drain was then used to encircle the the esophagus and we continued our dissection up into the mediastinum.  The stomach was then retracted superiorly, and we mobilized it off of the pancreas.  The left gastric artery was then isolated and divided with a robotic stabler.  ICG was then injected through his central line, and good blood flow to the stomach was evident.  We then marked an area away from the right gastroepiploic artery, and began to divide the omentum.    Once we achieved good mobilization, we focused our attention on the pylorus.  A 3cm longitudinal incision in the serosa was made, and the muscle was carefully divided.  We then began to tubularized the gastric conduit with several fires of the robotic stapler.  Once complete, the conduit was then attached to the distal end of the specimen.  We then undocked the robot, after removing all instruments, and the liver retractor, and then focused our attention placement of the jejunostomy tube.  The ligament of Treitz was identified, and a portion of small bowel about 20-30cm distal was used.  Corner stiches were placed, and passed out through the abdominal wall.  Using Seldinger technique, we access the small bowel and confirmed its position with insufflation.  The tract was sequentially dilated, and we passed a 60F jejunostomy tube distally.  The sutures were secured, and another proximal stitch was placed to prevent torsion.  The pneumoperitoneum was released, and all ports were removed.  The incisions were closed with absorbable suture.  The esophagoscope was removed.  The patient was then placed in a left lateral decubitus position.  The robotic ports were placed to triangulate the esophagus.  We continued our mobilization of the esophagus up to the azygous vein.  The azygous  vein was divided with a robotic stapler.  The esophagus was then mobilized circumferentially, and then divided at the level of the azygous vein.  A 4cm access incision was made inferiorly in the posterior axillary line.  A wound retractor was placed, and we pulled the specimen up along with the gastric conduit.  Proximal and distal margins from the specimen were sent off.  An gastrotomy was then made in the conduit, and 77m EEA stapler was inserted.  The anvil was passed on an NG tube, and it was passed through a small esophagotomy.  The two were connected, and the anastomosis was created.  Two complete rings were produced, and gastrotomy was closed with several fires of the stapler.  The suture line was buttressed with an omental pedical flap.  The ports were removed, and a 53F chest tube was placed.  An intercostal nerve block was performed.  The  lung was expanded.  The incisions were closed with absorbable suture.    The patient tolerated the procedure without any immediate complications, and was transferred to the PACU in stable condition.   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  He had undergone preoperative radiation chemotherapy progressed well after surgery and was discharged home on jejunal tube feedings and p.o. diet.  He noted last week having urinary tract symptoms and was treated with 3 days of antibiotics for presumed UTI.  3 days ago he noted having increasing bloating vomited once.  Yesterday noted increasing epigastric and back pain sometimes when walking.  He was evaluated with Gastrografin swallow-unfortunate there was some malfunction and there are no films available, CT of the chest after the Gastrografin swallow shows contained leak just below the level of the carina.  Patient denies any fever chills, he was transferred from [facility] to [facility] and admitted.  He has been started on IV  antibiotics n.p.o. and will resume tube feedings.  Patient does not appear toxic or septic.  Feels better currently even without any pain medicine administered.  He was admitted for IV antibiotics and close observation.  Brief Hospital Course:  He has remained afebrile and hemodynamically stable. He tolerated tube feedings. He remained on Meropenem and WBC gradually decreased. Swallow study done 07/27 showed similar appearance of contrast extravasation from the proximal aspect of the gastric pull-through into the mediastinum with contrast material tracking inferiorly and to the left in front of the spine. Extravasated contrast appears to remain contained within the mediastinum. His J tube became clogged the evening of 07/27. IR was able to unclog the J tube on 07/28. He tolerated tube feedings and was titrated to goal. He had intermittent a fib on admission. He was on Amiodarone and on Lopressor initially via tube then IV to help with HR. Amiodarone was stopped and IV Lopressor was continued. He remained in sinus rhythm. Repeat swallow study will be arranged outpatient. He is to remain NPO until the swallow study on continuous tube feeds. Home health nursing has been arranged to assist with j-tube administration of medication with flushing and tube feeding set up. Also his blood glucose level will need to be monitored closely since it has been elevated since admission. He will be discharged on both short acting and long acting insulin and the diabetes educator will meet with the patient and family prior to discharge.    Latest Vital Signs: Blood pressure 139/78, pulse 60, temperature 98.1 F (36.7 C), temperature source Oral, resp. rate 18, height _0  (1.88 m), weight 101.4 kg, SpO2 97 %.  Physical Exam:  General appearance: alert, cooperative and no distress Heart: regular rate and rhythm, S1, S2 normal, no murmur, click, rub or gallop and sinus brady Lungs: clear to auscultation  bilaterally Abdomen: soft, non-tender; bowel sounds normal; no masses,  no organomegaly Extremities: extremities normal, atraumatic, no cyanosis or edema Wound: clean and dry  Discharge Condition:Stable and discharged to home.  Recent laboratory studies:  Lab Results  Component Value Date   WBC 7.8 11/23/2019   HGB 9.8 (L) 11/23/2019   HCT 30.2 (L) 11/23/2019   MCV 92.4 11/23/2019   PLT 288 11/23/2019   Lab Results  Component Value Date   NA 133 (L) 11/19/2019   K 4.0 11/19/2019   CL 99 11/19/2019   CO2 25 11/19/2019   CREATININE 0.58 (L) 11/19/2019  GLUCOSE 162 (H) 11/19/2019      Diagnostic Studies: DG Chest 2 View  Result Date: 10/25/2019 CLINICAL DATA:  Pleural effusion. EXAM: CHEST - 2 VIEW COMPARISON:  10/19/2019 FINDINGS: RIGHT-sided PowerPort tip overlies the superior vena cava. Heart size is normal. There is increased opacity in the retrocardiac region in the LEFT LOWER lobe consistent with pleural effusion and atelectasis or consolidation. RIGHT lung is clear. There is residual contrast in the bowel loops in the LEFT UPPER QUADRANT. IMPRESSION: Increased opacity in the LEFT LOWER lobe consistent with pleural effusion and atelectasis or consolidation. Electronically Signed   By: Nolon Nations M.D.   On: 10/25/2019 12:07   CT Chest Wo Contrast  Result Date: 11/14/2019 CLINICAL DATA:  Esophageal cancer status post partial esophagectomy and gastric pull-through procedure. Epigastric pain, with evidence of neo esophagus perforation on previous CT. EXAM: CT CHEST WITHOUT CONTRAST TECHNIQUE: Multidetector CT imaging of the chest was performed following the standard protocol without IV contrast. COMPARISON:  11/13/2019 FINDINGS: Cardiovascular: Unenhanced imaging of the heart and great vessels demonstrates no pericardial effusion. Normal caliber of the thoracic aorta. Extensive atherosclerosis of the coronary vessels. Mediastinum/Nodes: Postsurgical changes are again noted from  esophagectomy and gastric pull-through. As seen on the previous CT, there is evidence of perforation of the neo esophagus just inferior to the level of the carina. The contained perforation extends along the ventral margin of the descending thoracic aorta. Water-soluble contrast was ingested prior to performance of the CT, and extravasated contrast outlines the contained perforation, which terminates just above the left hemidiaphragm. The remainder of the orally administered contrast outlines the gastric pull-through, and empties into the proximal small bowel. The thyroid and trachea are unremarkable.  No pathologic adenopathy. Lungs/Pleura: Small bilateral pleural effusions are again noted, right greater than left. Areas of atelectasis are seen within the right lower lobe. No pneumothorax. Upper Abdomen: Stable appearance of the upper abdomen. Oral contrast outlines the distal stomach and proximal small bowel. Musculoskeletal: No acute or destructive bony lesions. Reconstructed images demonstrate no additional findings. IMPRESSION: 1. Postsurgical changes from esophagectomy and gastric pull-through procedure. 2. 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 Administered oral contrast outlines the contained perforation within the posterior mediastinum, immediately anterior to the descending thoracic aorta. 3. Stable bilateral pleural effusions and basilar atelectasis. These results were called by telephone at the time of interpretation on 11/14/2019 at 2:26 am to provider Dr. Christy Gentles, Who verbally acknowledged these results. Electronically Signed   By: Randa Ngo M.D.   On: 11/14/2019 02:27   CT Chest W Contrast  Addendum Date: 11/13/2019   ADDENDUM REPORT: 11/13/2019 22:47 ADDENDUM: Impression 2 should read: Diffuse circumferential thickening of the neoesophagus with a small perforation along the mesial aspect with extraluminal gas and fluid contained  by the posterior mediastinum and layering adjacent the descending thoracic aorta. Some mild reactive thickening of the aortic wall may be present but without acute luminal abnormality or clear fistulization. Impression 7 should read: Postsurgical changes along the right lateral chest wall with additional abdominal trocar site to the right of a small fluid and fat containing umbilical hernia. No acute complication at the operative sites. Electronically Signed   By: Lovena Le M.D.   On: 11/13/2019 22:47   Result Date: 11/13/2019 CLINICAL DATA:  Epigastric pain, recent esophageal cancer surgery, robotic assisted Ivor-Lewis esophagectomy performed 10/18/2019 EXAM: CT CHEST, ABDOMEN, AND PELVIS WITH CONTRAST TECHNIQUE: Multidetector CT imaging of  the chest, abdomen and pelvis was performed following the standard protocol during bolus administration of intravenous contrast. CONTRAST:  170m OMNIPAQUE IOHEXOL 300 MG/ML  SOLN COMPARISON:  None. FINDINGS: CT CHEST FINDINGS Cardiovascular: Normal cardiac size. Three-vessel coronary artery calcifications are present. No pericardial effusion. Minimal atherosclerotic plaque in the normal caliber thoracic aorta. Normal 3 vessel branching of the aortic arch without acute luminal abnormality of the proximal great vessels are aorta. Small amount of stranding adjacent the aorta favored to be related to the postsurgical change and complications further detailed in the mediastinal section below. Central pulmonary arteries are normal caliber. No large central filling defects are seen on this non tailored examination of pulmonary arteries. Right IJ approach Port-A-Cath tip terminates at the superior cavoatrial junction. No major venous abnormalities. Mediastinum/Nodes: There are postsurgical changes from recent Ivor-Lewis esophagectomy with tubularization and pull-through of the stomach. There is diffuse circumferential thickening of the esophagus however, a small outpouching is  noted along the mesial aspect of the neo esophagus (2/31) with a small amount of extraluminal gas and fluid contained within the retroperitoneum and layering anterior to the aorta. Some mild reactive thickening of the aorta at wall may be present. (2/35) no clear fistulization is seen at this time. No acute abnormality of the trachea. Thyroid gland and thoracic inlet are unremarkable. No worrisome mediastinal, hilar or axillary adenopathy. Lungs/Pleura: Small left and moderate right pleural effusions with adjacent areas of atelectasis including more bandlike areas of subsegmental atelectasis in the right lower lobe. Underlying airspace disease is not fully excluded this is less favored this time. No pneumothorax. No other consolidative opacities. Musculoskeletal: Multilevel degenerative changes are present in the imaged portions of the spine. Multilevel flowing anterior osteophytosis, compatible with features of diffuse idiopathic skeletal hyperostosis (DISH). Postsurgical changes along the right lateral chest wall compatible with recent robotic assisted thoracotomy no visible displaced rib fractures. No acute or worrisome osseous lesions. Mild body wall edema. CT ABDOMEN PELVIS FINDINGS Hepatobiliary: No worrisome focal liver lesions. Smooth liver surface contour. Diffuse hepatic hypoattenuation compatible with hepatic steatosis. Sparing along the gallbladder fossa. High attenuation material layering dependently within gallbladder, could reflect small amount of biliary sludge. No pericholecystic fluid or inflammation. No biliary ductal dilatation. Pancreas: Partial fatty replacement of the pancreas. No pancreatic ductal dilatation or surrounding inflammatory changes. Spleen: Normal in size. No concerning splenic lesions. Adrenals/Urinary Tract: Normal adrenal glands. Asymmetric delayed left nephrogram with some asymmetrically increased left perinephric stranding. No concerning renal masses. No obstructive  urolithiasis or hydronephrosis. Few scattered nonobstructing calculi in both kidneys. Bladder is decompressed mild circumferential bladder wall thickening perivesicular haze is present however. Stomach/Bowel: Postsurgical changes of the neo esophagus with potential perforation in the posterior mediastinum, as detailed above. Stable line extending through the diaphragmatic hiatus in the upper abdomen appears intact. Duodenum with a normal course across the midline abdomen. No frank small bowel wall thickening or dilatation. A percutaneous jejunostomy seen in the left upper quadrant. A normal appendix is visualized. No colonic dilatation or wall thickening. Vascular/Lymphatic: Atherosclerotic calcifications within the abdominal aorta and branch vessels. No aneurysm or ectasia. No enlarged abdominopelvic lymph nodes. 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 Reproductive: The prostate and seminal vesicles are unremarkable. Other: Some additional edematous changes in the central mesentery as well as moderate diffuse body wall edema. Postsurgical changes along the small amount of fluid contained within the umbilical fat  containing hernia. Trocar site noted at the level of the umbilicus just to the right of midline. No acute abnormality of the trocar site. Musculoskeletal: Multilevel degenerative changes are present in the imaged portions of the spine. Levocurvature of the lumbar spine apex L4. Partial ankylosis across the right SI joint. Additional moderate degenerative changes in the bilateral hips. IMPRESSION: 1. Postsurgical changes from recent Ivor-Lewis esophagectomy with tubularization and pull-through of the stomach. 2. Diffuse circumferential thickening of the neoesophagus with a small outpouching along the mesial aspect with a small amount of extraluminal gas and fluid contained  within the retroperitoneum and layering adjacent the descending thoracic aorta. Some mild reactive thickening of the aorta at wall may be present. No clear fistulization is seen at this time. 3. Small left and moderate right pleural effusions with adjacent areas of atelectasis including more bandlike areas of subsegmental atelectasis in the right lower lobe. Underlying airspace disease is not fully excluded though less favored. 4. Asymmetric delayed left nephrogram with some asymmetrically increased left perinephric stranding. Findings are concerning for ascending urinary tract infection. Recommend correlation with urinalysis. 5. Additional bilateral nonobstructing nephrolithiasis. 6. Hepatic steatosis. 7. Postsurgical changes along the small amount of fluid contained within the umbilical fat containing hernia. No acute abnormality of the trocar site. 8. Aortic Atherosclerosis (ICD10-I70.0). These results were called by telephone at the time of interpretation on 11/13/2019 at 10:39 pm to provider Evalee Jefferson, who verbally acknowledged these results. Electronically Signed: By: Lovena Le M.D. On: 11/13/2019 22:41   CT ABDOMEN PELVIS W CONTRAST  Addendum Date: 11/13/2019   ADDENDUM REPORT: 11/13/2019 22:47 ADDENDUM: Impression 2 should read: Diffuse circumferential thickening of the neoesophagus with a small perforation along the mesial aspect with extraluminal gas and fluid contained by the posterior mediastinum and layering adjacent the descending thoracic aorta. Some mild reactive thickening of the aortic wall may be present but without acute luminal abnormality or clear fistulization. Impression 7 should read: Postsurgical changes along the right lateral chest wall with additional abdominal trocar site to the right of a small fluid and fat containing umbilical hernia. No acute complication at the operative sites. Electronically Signed   By: Lovena Le M.D.   On: 11/13/2019 22:47   Result Date:  11/13/2019 CLINICAL DATA:  Epigastric pain, recent esophageal cancer surgery, robotic assisted Ivor-Lewis esophagectomy performed 10/18/2019 EXAM: CT CHEST, ABDOMEN, AND PELVIS WITH CONTRAST TECHNIQUE: Multidetector CT imaging of the chest, abdomen and pelvis was performed following the standard protocol during bolus administration of intravenous contrast. CONTRAST:  170m OMNIPAQUE IOHEXOL 300 MG/ML  SOLN COMPARISON:  None. FINDINGS: CT CHEST FINDINGS Cardiovascular: Normal cardiac size. Three-vessel coronary artery calcifications are present. No pericardial effusion. Minimal atherosclerotic plaque in the normal caliber thoracic aorta. Normal 3 vessel branching of the aortic arch without acute luminal abnormality of the proximal great vessels are aorta. Small amount of stranding adjacent the aorta favored to be related to the postsurgical change and complications further detailed in the mediastinal section below. Central pulmonary arteries are normal caliber. No large central filling defects are seen on this non tailored examination of pulmonary arteries. Right IJ approach Port-A-Cath tip terminates at the superior cavoatrial junction. No major venous abnormalities. Mediastinum/Nodes: There are postsurgical changes from recent Ivor-Lewis esophagectomy with tubularization and pull-through of the stomach. There is diffuse circumferential thickening of the esophagus however, a small outpouching is noted along the mesial aspect of the neo esophagus (2/31) with a small amount of extraluminal gas and fluid contained within the  retroperitoneum and layering anterior to the aorta. Some mild reactive thickening of the aorta at wall may be present. (2/35) no clear fistulization is seen at this time. No acute abnormality of the trachea. Thyroid gland and thoracic inlet are unremarkable. No worrisome mediastinal, hilar or axillary adenopathy. Lungs/Pleura: Small left and moderate right pleural effusions with adjacent areas of  atelectasis including more bandlike areas of subsegmental atelectasis in the right lower lobe. Underlying airspace disease is not fully excluded this is less favored this time. No pneumothorax. No other consolidative opacities. Musculoskeletal: Multilevel degenerative changes are present in the imaged portions of the spine. Multilevel flowing anterior osteophytosis, compatible with features of diffuse idiopathic skeletal hyperostosis (DISH). Postsurgical changes along the right lateral chest wall compatible with recent robotic assisted thoracotomy no visible displaced rib fractures. No acute or worrisome osseous lesions. Mild body wall edema. CT ABDOMEN PELVIS FINDINGS Hepatobiliary: No worrisome focal liver lesions. Smooth liver surface contour. Diffuse hepatic hypoattenuation compatible with hepatic steatosis. Sparing along the gallbladder fossa. High attenuation material layering dependently within gallbladder, could reflect small amount of biliary sludge. No pericholecystic fluid or inflammation. No biliary ductal dilatation. Pancreas: Partial fatty replacement of the pancreas. No pancreatic ductal dilatation or surrounding inflammatory changes. Spleen: Normal in size. No concerning splenic lesions. Adrenals/Urinary Tract: Normal adrenal glands. Asymmetric delayed left nephrogram with some asymmetrically increased left perinephric stranding. No concerning renal masses. No obstructive urolithiasis or hydronephrosis. Few scattered nonobstructing calculi in both kidneys. Bladder is decompressed mild circumferential bladder wall thickening perivesicular haze is present however. Stomach/Bowel: Postsurgical changes of the neo esophagus with potential perforation in the posterior mediastinum, as detailed above. Stable line extending through the diaphragmatic hiatus in the upper abdomen appears intact. Duodenum with a normal course across the midline abdomen. No frank small bowel wall thickening or dilatation. A  percutaneous jejunostomy seen in the left upper quadrant. A normal appendix is visualized. No colonic dilatation or wall thickening. Vascular/Lymphatic: Atherosclerotic calcifications within the abdominal aorta and branch vessels. No aneurysm or ectasia. No enlarged abdominopelvic lymph nodes. 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 Reproductive: The prostate and seminal vesicles are unremarkable. Other: Some additional edematous changes in the central mesentery as well as moderate diffuse body wall edema. Postsurgical changes along the small amount of fluid contained within the umbilical fat containing hernia. Trocar site noted at the level of the umbilicus just to the right of midline. No acute abnormality of the trocar site. Musculoskeletal: Multilevel degenerative changes are present in the imaged portions of the spine. Levocurvature of the lumbar spine apex L4. Partial ankylosis across the right SI joint. Additional moderate degenerative changes in the bilateral hips. IMPRESSION: 1. Postsurgical changes from recent Ivor-Lewis esophagectomy with tubularization and pull-through of the stomach. 2. Diffuse circumferential thickening of the neoesophagus with a small outpouching along the mesial aspect with a small amount of extraluminal gas and fluid contained within the retroperitoneum and layering adjacent the descending thoracic aorta. Some mild reactive thickening of the aorta at wall may be present. No clear fistulization is seen at this time. 3. Small left and moderate right pleural effusions with adjacent areas of atelectasis including more bandlike areas of subsegmental atelectasis in the right lower lobe. Underlying airspace disease is not fully excluded though less favored. 4. Asymmetric delayed left nephrogram with some asymmetrically increased left perinephric  stranding. Findings are concerning for ascending urinary tract infection.  Recommend correlation with urinalysis. 5. Additional bilateral nonobstructing nephrolithiasis. 6. Hepatic steatosis. 7. Postsurgical changes along the small amount of fluid contained within the umbilical fat containing hernia. No acute abnormality of the trocar site. 8. Aortic Atherosclerosis (ICD10-I70.0). These results were called by telephone at the time of interpretation on 11/13/2019 at 10:39 pm to provider Evalee Jefferson, who verbally acknowledged these results. Electronically Signed: By: Lovena Le M.D. On: 11/13/2019 22:41   IR Cm Inj Any Colonic Tube W/Fluoro  Result Date: 11/17/2019 INDICATION: Concern for clogged feeding jejunostomy tube. EXAM: FLUOROSCOPIC GUIDED INJECTION OF FEEDING JEJUNOSTOMY TUBE COMPARISON:  None. MEDICATIONS: None. CONTRAST:  57m OMNIPAQUE IOHEXOL 300 MG/ML SOLN administered into the jejunal lumen FLUOROSCOPY TIME:  12 seconds (7 mGy) COMPLICATIONS: None immediate. PROCEDURE: The patient is feeding jejunostomy catheter was flushed with a small amount of saline resulting in normal functionality of the jejunostomy catheter As such, the patient was placed supine on the fluoroscopy table. Contrast injection demonstrated appropriate positioning and functionality of the feeding jejunostomy catheter with brisk opacification of the small bowel. The jejunostomy catheter was flushed with a small amount of saline and capped. A dressing was applied. The patient tolerated the procedure well without immediate postprocedural complication. IMPRESSION: Technically successful bedside unclogging of feeding jejunostomy catheter. No exchange performed. The feeding jejunostomy catheter is ready for immediate use. Electronically Signed   By: JSandi MariscalM.D.   On: 11/17/2019 13:14   DG Chest Port 1 View  Result Date: 11/15/2019 CLINICAL DATA:  Chest pain with recent esophagectomy EXAM: PORTABLE CHEST 1 VIEW COMPARISON:  Chest  radiograph November 13, 2019 and chest CT November 14, 2019 FINDINGS: Port-A-Cath tip is in the superior vena cava. No pneumothorax. There is apparent layering effusion on the right, better appreciated on CT from 1 day prior. There is no appreciable edema or consolidation. Soft tissue prominence in the area of esophagectomy is rather subtle by radiography and much better seen on recent CT. No edema or airspace opacity. Heart size normal. No adenopathy appreciable by radiography. No bone lesions. IMPRESSION: No pneumothorax. Apparent layering pleural effusion on the right, better seen by recent CT. No edema or airspace opacity. Stable cardiomediastinal silhouette with soft tissue prominence from recent esophagectomy much better seen on recent CT. Note that the contrast indicating suspected perforation of the neo esophagus seen 1 day prior is not appreciable on current radiographic examination. Electronically Signed   By: WLowella GripIII M.D.   On: 11/15/2019 07:36   DG Chest Port 1 View  Result Date: 11/13/2019 CLINICAL DATA:  Chest pain EXAM: PORTABLE CHEST 1 VIEW COMPARISON:  October 25, 2019 FINDINGS: There is a stable right-sided Port-A-Cath. The heart size is unchanged parent there are probable trace bilateral pleural effusions, right greater than left. There is no pneumothorax. No focal infiltrate. There is no acute osseous abnormality. There are gas distended loops of bowel in the upper abdomen. IMPRESSION: 1. Probable trace bilateral pleural effusions, right greater than left. 2. Gas distended loops of bowel in the upper abdomen. Electronically Signed   By: CConstance HolsterM.D.   On: 11/13/2019 21:22   DG ESOPHAGUS W SINGLE CM (SOL OR THIN BA)  Result Date: 11/16/2019 CLINICAL DATA:  Status post esophagectomy with gastric pull-through. Contrast leak on recent water-soluble esophagram. EXAM: ESOPHOGRAM/BARIUM SWALLOW TECHNIQUE: Single contrast examination was performed using water-soluble contrast  material. FLUOROSCOPY TIME:  Fluoroscopy Time:  0 minutes and 30 seconds. Radiation Exposure Index (if provided by the fluoroscopic device): 72.1  mGy Number of Acquired Spot Images: COMPARISON:  Chest CT 11/14/2019. FINDINGS: Similar to yesterday's CT scan, there is evidence of contrast extravasation from the medial aspect of the gastric pull-through. This appears to be distal to the proximal anastomosis and below the level of the carina. Contrast tracks medially and then inferiorly to the left, anterior to the spine. The extravasated contrast material flows in a track mimicking the course of the native esophagus prior to resection. Today's fluoroscopy image is very similar to the coronal CT image 94 of series 5 on the CT scan from 2 days ago. The extravasated contrast material appears to remain within the mediastinum. IMPRESSION: 1. Similar appearance of contrast extravasation from the proximal aspect of the gastric pull-through into the mediastinum with contrast material tracking inferiorly and to the left in front of the spine. Extravasated contrast appears to remain contained within the mediastinum. Electronically Signed   By: Misty Stanley M.D.   On: 11/16/2019 12:35   DG ESOPHAGUS W SINGLE CM (SOL OR THIN BA)  Result Date: 11/14/2019 CLINICAL DATA:  Esophageal cancer with gastric pull-through, evidence of perforation on previous CT EXAM: ESOPHOGRAM/BARIUM SWALLOW TECHNIQUE: Single contrast examination was performed using water-soluble contrast. FLUOROSCOPY TIME:  Fluoroscopy Time:  27 seconds Radiation Exposure Index (if provided by the fluoroscopic device): 19.87 mGy Number of Acquired Spot Images: 0 COMPARISON:  11/13/2019 FINDINGS: Under fluoroscopic visualization, the patient ingested water-soluble contrast to evaluate the neo esophagus perforation seen on prior CT. Because of equipment malfunction, the fluoroscopic images were not saved and no images could be submitted to PACS. During real-time  evaluation, the water-soluble contrast could be seen outlining the gastric pull-through, with extravasation into the posterior mediastinum corresponding to the contained perforation seen on previous CT. A subsequent CT scan was ordered by the emergency room, clearly outlining the contained perforation in the posterior mediastinum ventral to the descending thoracic aorta. IMPRESSION: 1. Single-contrast esophagram with water-soluble contrast demonstrating contained perforation of the gastric pull-through procedure. Because of archive malfunction, fluoroscopic images were not saved. Subsequent CT scan was performed demonstrating extravasated oral contrast outlining the contained perforation in the posterior mediastinum. These results were called by telephone at the time of interpretation on 11/14/2019 at 2:26 am to provider Dr. Christy Gentles, Who verbally acknowledged these results. Electronically Signed   By: Randa Ngo M.D.   On: 11/14/2019 02:26     Discharge Medications: Allergies as of 11/23/2019      Reactions   Adhesive [tape] Rash      Medication List    STOP taking these medications   albuterol 108 (90 Base) MCG/ACT inhaler Commonly known as: VENTOLIN HFA   ALPRAZolam 0.5 MG tablet Commonly known as: XANAX   amiodarone 200 MG tablet Commonly known as: PACERONE   aspirin EC 81 MG tablet   atorvastatin 40 MG tablet Commonly known as: LIPITOR   calcium carbonate 500 MG chewable tablet Commonly known as: TUMS - dosed in mg elemental calcium   lidocaine-prilocaine cream Commonly known as: EMLA   metFORMIN 500 MG tablet Commonly known as: GLUCOPHAGE   metoCLOPramide 5 MG/5ML solution Commonly known as: REGLAN   traMADol 5 mg/mL Susp Commonly known as: ULTRAM     TAKE these medications   acetaminophen 160 MG/5ML solution Commonly known as: TYLENOL Place 10.2-20.3 mLs (325-650 mg total) into feeding tube every 4 (four) hours as needed for mild pain or moderate pain. What  changed:   how much to take  when to take this  reasons to take this   blood glucose meter kit and supplies Test glucose once daily. ICD 10 E11.9   feeding supplement (OSMOLITE 1.5 CAL) Liqd Place 1,000 mLs into feeding tube continuous for 15 days.   feeding supplement (PROSource TF) liquid Place 45 mLs into feeding tube 3 (three) times daily for 15 days.   insulin aspart 100 UNIT/ML FlexPen Commonly known as: NOVOLOG Inject 3 Units into the skin 3 (three) times daily with meals.   insulin aspart 100 UNIT/ML FlexPen Commonly known as: NOVOLOG CBG < 70: Implement Hypoglycemia Standing Orders and refer to Hypoglycemia Standing Orders sidebar report  CBG < 120 (dose in units): 0  CBG 120 - 160 (dose in units): 2  CBG 161 - 200 (dose in units): 4  CBG 201 - 250 (dose in units): 8  CBG 251 - 300 (dose in units): 12  CBG 301 - 350 (dose in units) 16  CBG 351 - 450 (dose in units): 20  CBG > 450 24 units and obtain STAT glucose and notify MD  *this will be a short term medication that will be susceptible to changes when your tube feedings are changes   insulin detemir 100 UNIT/ML injection Commonly known as: LEVEMIR Inject 0.2 mLs (20 Units total) into the skin daily. Start taking on: November 24, 2019 What changed:   how much to take  when to take this   insulin starter kit- pen needles Misc 1 kit by Other route once for 1 dose.   metoprolol tartrate 25 MG tablet Commonly known as: LOPRESSOR Place 1 tablet (25 mg total) into feeding tube 2 (two) times daily. Crush finely and insert in Jtube   oxyCODONE 5 MG/5ML solution Commonly known as: ROXICODONE Place 5 mLs (5 mg total) into feeding tube every 6 (six) hours as needed for up to 7 days for severe pain.   pantoprazole sodium 40 mg/20 mL Pack Commonly known as: PROTONIX Place 20 mLs (40 mg total) into feeding tube daily. Start taking on: November 24, 2019   Pen Needles 3/16" 31G X 5 MM Misc Inject 3 Units as  directed 3 (three) times daily.       Follow Up Appointments:  Follow-up Information    Lajuana Matte, MD. Go on 11/26/2019.   Specialty: Cardiothoracic Surgery Why: Appointment time is at 10:45 am Contact information: [place] [address] [place], [place], [place]. Call in 1 day(s).   Specialty: Family Medicine Contact information: [place] [address] [phone]        Herminio Commons, MD .   Specialty: Cardiology Contact information: [place] [address] [phone]               Signed: Terance Hart ContePA-C 11/23/2019, 5:43 PM

## 2019-11-17 ENCOUNTER — Inpatient Hospital Stay (HOSPITAL_COMMUNITY): Payer: BC Managed Care – PPO

## 2019-11-17 HISTORY — PX: IR CM INJ ANY COLONIC TUBE W/FLUORO: IMG2336

## 2019-11-17 LAB — BASIC METABOLIC PANEL
Anion gap: 8 (ref 5–15)
BUN: 6 mg/dL (ref 6–20)
CO2: 30 mmol/L (ref 22–32)
Calcium: 8.5 mg/dL — ABNORMAL LOW (ref 8.9–10.3)
Chloride: 99 mmol/L (ref 98–111)
Creatinine, Ser: 0.59 mg/dL — ABNORMAL LOW (ref 0.61–1.24)
GFR calc Af Amer: >60 mL/min (ref >60)
GFR calc non Af Amer: >60 mL/min (ref >60)
Glucose, Bld: 105 mg/dL — ABNORMAL HIGH (ref 70–99)
Potassium: 3.7 mmol/L (ref 3.5–5.1)
Sodium: 137 mmol/L (ref 135–145)

## 2019-11-17 LAB — CBC
HCT: 29 % — ABNORMAL LOW (ref 39.0–52.0)
Hemoglobin: 9.4 g/dL — ABNORMAL LOW (ref 13.0–17.0)
MCH: 30 pg (ref 26.0–34.0)
MCHC: 32.4 g/dL (ref 30.0–36.0)
MCV: 92.7 fL (ref 80.0–100.0)
Platelets: 304 10*3/uL (ref 150–400)
RBC: 3.13 MIL/uL — ABNORMAL LOW (ref 4.22–5.81)
RDW: 13.1 % (ref 11.5–15.5)
WBC: 6.6 10*3/uL (ref 4.0–10.5)
nRBC: 0 % (ref 0.0–0.2)

## 2019-11-17 LAB — GLUCOSE, CAPILLARY
Glucose-Capillary: 101 mg/dL — ABNORMAL HIGH (ref 70–99)
Glucose-Capillary: 118 mg/dL — ABNORMAL HIGH (ref 70–99)
Glucose-Capillary: 162 mg/dL — ABNORMAL HIGH (ref 70–99)
Glucose-Capillary: 194 mg/dL — ABNORMAL HIGH (ref 70–99)
Glucose-Capillary: 201 mg/dL — ABNORMAL HIGH (ref 70–99)
Glucose-Capillary: 216 mg/dL — ABNORMAL HIGH (ref 70–99)
Glucose-Capillary: 93 mg/dL (ref 70–99)

## 2019-11-17 MED ORDER — IOHEXOL 300 MG/ML  SOLN
50.0000 mL | Freq: Once | INTRAMUSCULAR | Status: AC | PRN
  Administered 2019-11-17: 10 mL

## 2019-11-17 MED ORDER — POTASSIUM CHLORIDE 20 MEQ/15ML (10%) PO SOLN
40.0000 meq | Freq: Once | ORAL | Status: AC
  Administered 2019-11-17: 40 meq via ORAL
  Filled 2019-11-17: qty 30

## 2019-11-17 MED ORDER — LIDOCAINE VISCOUS HCL 2 % MT SOLN
OROMUCOSAL | Status: AC
  Filled 2019-11-17: qty 15

## 2019-11-17 NOTE — Progress Notes (Signed)
Resumed tube feeding 44ml/hr per PA. Will continue to monitor the pt.  Lavenia Atlas, RN

## 2019-11-17 NOTE — Procedures (Signed)
Pre procedural Dx: Clogged feeding J-tube. Post procedural Dx: Same  Technically successful bedside unclogging of feeding jejunostomy catheter.  No exchange performed.   The feeding jejunostomy catheter is ready for immediate use.    Ronny Bacon, MD Pager #: [phone]

## 2019-11-17 NOTE — Progress Notes (Signed)
Attempted to flush peg tube.  Tube would not flush. Tried to flush tube with coke.  Unable to do so.  Another RN also attempted to flush tube unsuccessfully.  Patient stated that when the peg tube has gotten clogged in the past, they take a long Qtip and use that to unclog it.  This RN informed patient that his was not he protocol . Tube feedings on hold at this time.

## 2019-11-17 NOTE — Progress Notes (Addendum)
      [address]       [place] [address]             [phone]      POD 30: S/p Esophagoscopy - Robotic assisted laparoscopy - Robotic assisted thoracoscopy - Ivor-Lewis esophagectomy - Pyloromyotomy - Laparoscopic jejunostomy tube placement 54F     Intercostal nerve block by Dr. Kipp Brood on 10/18/2019.  Subjective: Patient states stomach feels like it is burning this am. He does not have milky like reflux since TFs were stopped as J tube clogged late last evening.  Objective: Vital signs in last 24 hours: Temp:  [97.5 F (36.4 C)-98.5 F (36.9 C)] 97.9 F (36.6 C) (07/28 0501) Pulse Rate:  [60-69] 66 (07/28 0501) Cardiac Rhythm: Normal sinus rhythm (07/27 1923) Resp:  [16-22] 22 (07/28 0509) BP: (128-147)/(75-84) 128/83 (07/28 0501) SpO2:  [95 %-97 %] 95 % (07/28 0501) Weight:  [101.2 kg] 101.2 kg (07/28 0509)      Intake/Output from previous day: 07/27 0701 - 07/28 0700 In: 995 [NG/GT:800] Out: 2050 [Urine:2050]   Physical Exam:  Cardiovascular: RRR Pulmonary: Clear to auscultation on the left and diminished right base Abdomen: Soft, non tender, bowel sounds present. Extremities: Mild bilateral lower extremity edema. Wounds: Clean and dry.  No erythema or signs of infection.   Lab Results: CBC: Recent Labs    11/15/19 0224 11/17/19 0443  WBC 10.0 6.6  HGB 9.2* 9.4*  HCT 28.2* 29.0*  PLT 284 304   BMET:  Recent Labs    11/15/19 0224 11/17/19 0443  NA 133* 137  K 3.8 3.7  CL 98 99  CO2 29 30  GLUCOSE 153* 105*  BUN 7 6  CREATININE 0.63 0.59*  CALCIUM 8.5* 8.5*    PT/INR: No results for input(s): LABPROT, INR in the last 72 hours. ABG:  INR: Will add last result for INR, ABG once components are confirmed Will add last 4 CBG results once components are confirmed  Assessment/Plan:  1. CV - Previous intermittent a fib. SB with HR in the 50's this am.  2.  Pulmonary - On room air. 3. GI-NPO, TFs at 45 ml/hr (on  hold as of last night;J tube clogged late last evening).  Likely ask IR to assist. On Protonix IV.  Of note, swallow study yesterday showed similar appearance of contrast extravasation from the proximal aspect of the gastric pull-through into the mediastinum with contrast material tracking inferiorly and to the left in front of the spine. Extravasated contrast appears to remain contained within the mediastinum. 4. DM-CBGs 153/162/93. On Insulin. HGA1C 06/01 7.8. He was on Metformin prior to surgery. He will need follow up after discharge with medical doctor. 5. ID-on Zosyn for delayed post op esophageal leak 6. Anemia-H and H this am 9.4 and 29 7. Supplement potassium  Midge Momon M ZimmermanPA-C 11/17/2019,7:12 AM [phone]

## 2019-11-18 DIAGNOSIS — J95812 Postprocedural air leak: Secondary | ICD-10-CM

## 2019-11-18 LAB — GLUCOSE, CAPILLARY
Glucose-Capillary: 190 mg/dL — ABNORMAL HIGH (ref 70–99)
Glucose-Capillary: 228 mg/dL — ABNORMAL HIGH (ref 70–99)
Glucose-Capillary: 259 mg/dL — ABNORMAL HIGH (ref 70–99)
Glucose-Capillary: 228 mg/dL — ABNORMAL HIGH (ref 70–99)
Glucose-Capillary: 207 mg/dL — ABNORMAL HIGH (ref 70–99)
Glucose-Capillary: 114 mg/dL — ABNORMAL HIGH (ref 70–99)

## 2019-11-18 MED ORDER — PANCRELIPASE (LIP-PROT-AMYL) 10440-39150 UNITS PO TABS
20880.0000 [IU] | ORAL_TABLET | Freq: Once | ORAL | Status: AC
  Administered 2019-11-18: 20880 [IU]
  Filled 2019-11-18: qty 2

## 2019-11-18 MED ORDER — METOPROLOL TARTRATE 5 MG/5ML IV SOLN
5.0000 mg | Freq: Three times a day (TID) | INTRAVENOUS | Status: DC
  Administered 2019-11-18 – 2019-11-23 (×15): 5 mg via INTRAVENOUS
  Filled 2019-11-18 (×16): qty 5

## 2019-11-18 MED ORDER — INSULIN DETEMIR 100 UNIT/ML ~~LOC~~ SOLN
12.0000 [IU] | Freq: Every day | SUBCUTANEOUS | Status: DC
  Administered 2019-11-18 – 2019-11-19 (×2): 12 [IU] via SUBCUTANEOUS
  Filled 2019-11-18 (×3): qty 0.12

## 2019-11-18 MED ORDER — METOCLOPRAMIDE HCL 5 MG/ML IJ SOLN
10.0000 mg | Freq: Four times a day (QID) | INTRAMUSCULAR | Status: AC
  Administered 2019-11-18 – 2019-11-20 (×8): 10 mg via INTRAVENOUS
  Filled 2019-11-18 (×8): qty 2

## 2019-11-18 MED ORDER — OSMOLITE 1.5 CAL PO LIQD
1000.0000 mL | ORAL | Status: DC
  Administered 2019-11-19: 1000 mL
  Filled 2019-11-18 (×3): qty 1000

## 2019-11-18 MED ORDER — SODIUM BICARBONATE 650 MG PO TABS
650.0000 mg | ORAL_TABLET | Freq: Once | ORAL | Status: AC
  Administered 2019-11-18: 650 mg
  Filled 2019-11-18: qty 1

## 2019-11-18 NOTE — Plan of Care (Signed)
Continue to monitor

## 2019-11-18 NOTE — Plan of Care (Signed)
Poc progressing.  

## 2019-11-18 NOTE — Progress Notes (Addendum)
Nutrition Follow Up  DOCUMENTATION CODES:   Severe malnutrition in context of chronic illness  INTERVENTION:   Tube Feeding:  -Osmolite 1.5 @ 30 ml/hr via J-tube -Increase by 10 ml Q6 hours to goal rate of 70 ml/hr (1680 ml) -ProSourceTF 45 mL TID Provides 2640 kcals, 138 g of protein and 1277 mL of free water  Off IVF and if no fluids by mouth, pt needs 200 mL free water q 6 hours for hydration per tube  Recommend patient continue TF via J-tube post discharge regardless of diet advancement given continued weight loss, poor po intake with early satiety and continued malnutrition  NUTRITION DIAGNOSIS:   Severe Malnutrition related to chronic illness, cancer and cancer related treatments as evidenced by moderate fat depletion, severe fat depletion, percent weight loss.  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 Pt also previously underwent chemo and radiation therapy. Additional PMH includes DM   Pt complained of burning in stomach with Osmolite 1.5 running at 45 ml/hr. After discussion with pt, he endorses taking Reglan QID at home. Reglan added this am per TCTS. Symptoms have improved. Had liquid BM this am per RN documentation. Titrate tube feeding to goal.   Pt remains NPO. The leak appears to be unchanged. Pt will need home TF (to meet 100% of needs) regardless of PO status given the severity of malnutrition.   Admission weight: 106.6 kg  Current weight: 101.1 kg   Drips: D5 in 1/2NS @ 50 ml/hr  Medications: SS  novolog, levemir, 10 mg reglan BID Labs: CBG [id number]  Diet Order:   Diet Order            Diet NPO time specified  Diet effective now                 EDUCATION NEEDS:   Education needs have been addressed  Skin:  Skin Assessment: Skin Integrity Issues: Skin Integrity Issues:: Incisions, Other (Comment) Incisions: abdomen Other: MASD-buttocks  Last BM:  7/29  Height:   Ht Readings from Last 1 Encounters:  11/14/19 6\' 2"  (1.88 m)    Weight:   Wt Readings from Last 1 Encounters:  11/18/19 (!) 101.1 kg     BMI:  Body mass index is 28.62 kg/m.  Estimated Nutritional Needs:   Kcal:  1572-6203  Protein:  125-150 g  Fluid:  >/= 2 L   Mariana Single RD, LDN Clinical Nutrition Pager listed in [place]

## 2019-11-18 NOTE — Progress Notes (Addendum)
      [address]       [place] [address]             [phone]      POD 31: S/p Esophagoscopy - Robotic assisted laparoscopy - Robotic assisted thoracoscopy - Ivor-Lewis esophagectomy - Pyloromyotomy - Laparoscopic jejunostomy tube placement 16F     Intercostal nerve block by Dr. Kipp Brood on 10/18/2019.  Subjective: Patient still with burning in the stomach.  Objective: Vital signs in last 24 hours: Temp:  [97.6 F (36.4 C)-98.3 F (36.8 C)] 98.1 F (36.7 C) (07/29 0334) Pulse Rate:  [54-110] 110 (07/29 0334) Cardiac Rhythm: Sinus bradycardia;Heart block (07/28 1900) Resp:  [15-20] 17 (07/29 0334) BP: (131-151)/(78-85) 140/85 (07/29 0334) SpO2:  [95 %-98 %] 95 % (07/29 0334) Weight:  [101.1 kg] 101.1 kg (07/29 0332)      Intake/Output from previous day: 07/28 0701 - 07/29 0700 In: 2906.7 [I.V.:1270.6; NG/GT:931.8; IV Piggyback:389.4] Out: 1600 [Urine:1600]   Physical Exam:  Cardiovascular: RRR Pulmonary: Clear to auscultation on the left and slightly diminished right base Abdomen: Soft, non tender, bowel sounds present. Extremities: Trace bilateral lower extremity edema. Wounds: Clean and dry.  No erythema or signs of infection.   Lab Results: CBC: Recent Labs    11/17/19 0443  WBC 6.6  HGB 9.4*  HCT 29.0*  PLT 304   BMET:  Recent Labs    11/17/19 0443  NA 137  K 3.7  CL 99  CO2 30  GLUCOSE 105*  BUN 6  CREATININE 0.59*  CALCIUM 8.5*    PT/INR: No results for input(s): LABPROT, INR in the last 72 hours. ABG:  INR: Will add last result for INR, ABG once components are confirmed Will add last 4 CBG results once components are confirmed  Assessment/Plan:  1. CV - Previous intermittent a fib. Occasional ST, firs degree heart block and now on Lopressor 5 mg IV Q 6 hours but will change to every 8 hours as HR barely in the 60's. 2.  Pulmonary - On room air. 3. GI-NPO, TFs at 45 ml/hr . IR successfully unclogged J tube  yesterday. On Protonix IV.  As discussed with Dr. Kipp Brood, will start Reglan. 4. DM-CBGs 194/201/190. On Insulin but will increase for better glucose control. HGA1C 06/01 7.8. He was on Metformin prior to surgery. He will need follow up after discharge with medical doctor. 5. ID-on Zosyn for delayed post op esophageal leak 6. Anemia-Last H and H 9.4 and 29   Steven Crane 11/18/2019,6:57 AM [phone]  Agree with above We will start Reglan today Tolerating tube feeds. We will attempt to increase goal. Dispo planning for early next week  Steven Crane

## 2019-11-19 ENCOUNTER — Encounter (HOSPITAL_COMMUNITY): Payer: BC Managed Care – PPO

## 2019-11-19 ENCOUNTER — Ambulatory Visit: Payer: BC Managed Care – PPO | Admitting: Thoracic Surgery (Cardiothoracic Vascular Surgery)

## 2019-11-19 LAB — BASIC METABOLIC PANEL
Anion gap: 9 (ref 5–15)
BUN: 7 mg/dL (ref 6–20)
CO2: 25 mmol/L (ref 22–32)
Calcium: 8.4 mg/dL — ABNORMAL LOW (ref 8.9–10.3)
Chloride: 99 mmol/L (ref 98–111)
Creatinine, Ser: 0.58 mg/dL — ABNORMAL LOW (ref 0.61–1.24)
GFR calc Af Amer: >60 mL/min (ref >60)
GFR calc non Af Amer: >60 mL/min (ref >60)
Glucose, Bld: 162 mg/dL — ABNORMAL HIGH (ref 70–99)
Potassium: 4 mmol/L (ref 3.5–5.1)
Sodium: 133 mmol/L — ABNORMAL LOW (ref 135–145)

## 2019-11-19 LAB — GLUCOSE, CAPILLARY
Glucose-Capillary: 131 mg/dL — ABNORMAL HIGH (ref 70–99)
Glucose-Capillary: 164 mg/dL — ABNORMAL HIGH (ref 70–99)
Glucose-Capillary: 228 mg/dL — ABNORMAL HIGH (ref 70–99)
Glucose-Capillary: 233 mg/dL — ABNORMAL HIGH (ref 70–99)
Glucose-Capillary: 238 mg/dL — ABNORMAL HIGH (ref 70–99)
Glucose-Capillary: 297 mg/dL — ABNORMAL HIGH (ref 70–99)

## 2019-11-19 MED ORDER — OSMOLITE 1.5 CAL PO LIQD
1000.0000 mL | ORAL | Status: DC
  Administered 2019-11-20 – 2019-11-22 (×5): 1000 mL
  Filled 2019-11-19 (×10): qty 1000

## 2019-11-19 NOTE — Progress Notes (Signed)
Mobility Specialist - Progress Note   11/19/19 1429  Mobility  Activity Ambulated in hall  Level of Assistance Contact guard assist, steadying assist  Assistive Device None  Distance Ambulated (ft) 470 ft  Mobility Response Tolerated well  Mobility performed by Mobility specialist  $Mobility charge 1 Mobility    Pre-mobility: 70 HR, 116/86 BP, 93% SpO2 During mobility: 91 HR Post-mobility: 77 HR, 146/81 BP, 97% SpO2  Pt says he typically walks with a RW at home when he goes for longer walks outside, but otherwise doesn't use one. Pt requested to walk w/o today.   Pricilla Handler Mobility Specialist Mobility Specialist Phone: [phone]

## 2019-11-19 NOTE — Progress Notes (Addendum)
      [address]       [place] [address]             [phone]      POD 32: S/p Esophagoscopy - Robotic assisted laparoscopy - Robotic assisted thoracoscopy - Ivor-Lewis esophagectomy - Pyloromyotomy - Laparoscopic jejunostomy tube placement 47F     Intercostal nerve block by Dr. Kipp Brood on 10/18/2019.  Subjective: Patient states burning in stomach better;given Reglan. He states J tube became clogged last night but nurse was able to unclog it.  Objective: Vital signs in last 24 hours: Temp:  [97.5 F (36.4 C)-98.6 F (37 C)] 97.8 F (36.6 C) (07/30 0329) Pulse Rate:  [59-80] 80 (07/30 0329) Cardiac Rhythm: Normal sinus rhythm;Heart block (07/29 1900) Resp:  [17-19] 19 (07/30 0329) BP: (131-158)/(65-91) 158/65 (07/30 0329) SpO2:  [96 %-98 %] 98 % (07/30 0329) Weight:  [101.4 kg] 101.4 kg (07/30 0332)      Intake/Output from previous day: 07/29 0701 - 07/30 0700 In: 455 [NG/GT:455] Out: 1085 [Urine:1085]   Physical Exam:  Cardiovascular: RRR Pulmonary: Clear to auscultation on the left and slightly diminished right base Abdomen: Soft, non tender, bowel sounds present. Extremities: Trace bilateral lower extremity edema. Wounds: Clean and dry.  No erythema or signs of infection.   Lab Results: CBC: Recent Labs    11/17/19 0443  WBC 6.6  HGB 9.4*  HCT 29.0*  PLT 304   BMET:  Recent Labs    11/17/19 0443 11/19/19 0434  NA 137 133*  K 3.7 4.0  CL 99 99  CO2 30 25  GLUCOSE 105* 162*  BUN 6 7  CREATININE 0.59* 0.58*  CALCIUM 8.5* 8.4*    PT/INR: No results for input(s): LABPROT, INR in the last 72 hours. ABG:  INR: Will add last result for INR, ABG once components are confirmed Will add last 4 CBG results once components are confirmed  Assessment/Plan:  1. CV - Previous intermittent a fib. SR, first degree heart block and now on Lopressor 5 mg IV Q 6 hours but will change to every 8 hours as HR barely in the 60's. 2.   Pulmonary - On room air. 3. GI-NPO, TFs at 45 ml/hr . IR successfully unclogged J tube yesterday. On Protonix IV.  Continue Reglan. Likely swallow study Monday 4. DM-CBGs 207/114/164. On Insulin but will increase for better glucose control. HGA1C 06/01 7.8. He was on Metformin prior to surgery. He will need follow up after discharge with medical doctor. 5. ID-on Zosyn for delayed post op esophageal leak 6. Anemia-Last H and H 9.4 and 33   Steven M ZimmermanPA-C 11/19/2019,7:10 AM [phone]  Continued titration of his tube feeds to goal. No need for swallow on Monday as patient will be discharged with n.p.o. status. Dispo planning for Monday  [place]

## 2019-11-19 NOTE — Plan of Care (Signed)
  Problem: Education: Goal: Knowledge of General Education information will improve Description Including pain rating scale, medication(s)/side effects and non-pharmacologic comfort measures Outcome: Progressing   

## 2019-11-19 NOTE — Progress Notes (Signed)
Patient feels as he can only tolerate tube feed at 60 ml/hr. Patient is starting to feel "bloated and full". Will continue to monitor.

## 2019-11-19 NOTE — Plan of Care (Signed)
Continue to monitor

## 2019-11-20 LAB — GLUCOSE, CAPILLARY
Glucose-Capillary: 234 mg/dL — ABNORMAL HIGH (ref 70–99)
Glucose-Capillary: 284 mg/dL — ABNORMAL HIGH (ref 70–99)
Glucose-Capillary: 176 mg/dL — ABNORMAL HIGH (ref 70–99)
Glucose-Capillary: 291 mg/dL — ABNORMAL HIGH (ref 70–99)
Glucose-Capillary: 259 mg/dL — ABNORMAL HIGH (ref 70–99)

## 2019-11-20 MED ORDER — INSULIN DETEMIR 100 UNIT/ML ~~LOC~~ SOLN
15.0000 [IU] | Freq: Every day | SUBCUTANEOUS | Status: DC
  Administered 2019-11-20 – 2019-11-21 (×2): 15 [IU] via SUBCUTANEOUS
  Filled 2019-11-20 (×3): qty 0.15

## 2019-11-20 NOTE — Progress Notes (Signed)
      [address]       [place] [address]             [phone]     POD 33: S/p Esophagoscopy - Robotic assisted laparoscopy - Robotic assisted thoracoscopy - Ivor-Lewis esophagectomy - Pyloromyotomy - Laparoscopic jejunostomy tube placement 70F     Intercostal nerve block by Dr. Kipp Brood on 10/18/2019.   Subjective:  Patient has no new complaints.  States he feels full.  Objective: Vital signs in last 24 hours: Temp:  [97.8 F (36.6 C)-98.3 F (36.8 C)] 98.2 F (36.8 C) (07/31 0741) Pulse Rate:  [55-95] 55 (07/31 0741) Cardiac Rhythm: Normal sinus rhythm;Heart block (07/30 2000) Resp:  [16-20] 19 (07/31 0741) BP: (136-151)/(77-85) 147/77 (07/31 0741) SpO2:  [96 %-100 %] 97 % (07/31 0741) Weight:  [100.6 kg] 100.6 kg (07/31 0444)  Intake/Output from previous day: 07/30 0701 - 07/31 0700 In: 3650.9 [P.O.:240; I.V.:2452.1; NG/GT:600; IV Piggyback:328.9] Out: 1976 [Urine:1975; Stool:1]  General appearance: alert, cooperative and no distress Heart: regular rate and rhythm Lungs: clear to auscultation bilaterally Abdomen: soft, non-tender; bowel sounds normal; no masses,  no organomegaly Extremities: edema trace  Lab Results: No results for input(s): WBC, HGB, HCT, PLT in the last 72 hours. BMET:  Recent Labs    11/19/19 0434  NA 133*  K 4.0  CL 99  CO2 25  GLUCOSE 162*  BUN 7  CREATININE 0.58*  CALCIUM 8.4*    PT/INR: No results for input(s): LABPROT, INR in the last 72 hours. ABG    Component Value Date/Time   PHART 7.462 (H) 10/19/2019 0510   HCO3 23.7 10/19/2019 0510   TCO2 26 10/18/2019 1640   ACIDBASEDEF 3.9 (H) 10/18/2019 1809   O2SAT 98.4 10/19/2019 0510   CBG (last 3)  Recent Labs    11/19/19 2330 11/20/19 0443 11/20/19 0744  GLUCAP 131* 234* 284*    Assessment/Plan:  1. CV- Sinus Bradycardia- continue Lopressor 2. Pulm- no acute issues  3. GI- remains NPO, TFs at 60 ml/hr.. patient tolerating w/o N/V..... no  swallow study on Monday 4. DM- sugars remain elevated, will increase Levemir to 15 units 5. ID- continue zosyn for esophageal leak 6. Dispo- patient stable, continue tube feedings at current rate... plan for discharge home Monday with continuous tube feedings, likely ABX   LOS: 6 days    Ellwood Handler, PA-C 11/20/2019

## 2019-11-20 NOTE — Progress Notes (Signed)
Mobility Specialist - Progress Note   11/20/19 1327  Mobility  Activity Ambulated in hall  Level of Assistance Independent  Assistive Device None  Distance Ambulated (ft) 230 ft  Mobility Response Tolerated poorly  Mobility performed by Mobility specialist  $Mobility charge 1 Mobility    Pre-mobility: 69 HR, 142/80 BP, 96% SpO2 During mobility: 144 HR Post-mobility: 67 HR, 144/82 BP, 97% SpO2  Pt's HR spiked to 144 while ambulating, and he endorsed feeling midsternal chest tightness. I rolled him back to his room w/ an office chair, his HR went down to 72 by the time we reached his bed. RN was notified.   Pricilla Handler Mobility Specialist Mobility Specialist Phone: [phone]

## 2019-11-21 LAB — GLUCOSE, CAPILLARY
Glucose-Capillary: 216 mg/dL — ABNORMAL HIGH (ref 70–99)
Glucose-Capillary: 231 mg/dL — ABNORMAL HIGH (ref 70–99)
Glucose-Capillary: 242 mg/dL — ABNORMAL HIGH (ref 70–99)
Glucose-Capillary: 251 mg/dL — ABNORMAL HIGH (ref 70–99)
Glucose-Capillary: 264 mg/dL — ABNORMAL HIGH (ref 70–99)
Glucose-Capillary: 266 mg/dL — ABNORMAL HIGH (ref 70–99)

## 2019-11-21 MED ORDER — PANTOPRAZOLE SODIUM 40 MG PO PACK
40.0000 mg | PACK | Freq: Every day | ORAL | Status: DC
  Administered 2019-11-22 – 2019-11-23 (×2): 40 mg
  Filled 2019-11-21 (×2): qty 20

## 2019-11-21 MED ORDER — METOCLOPRAMIDE HCL 5 MG/ML IJ SOLN
10.0000 mg | Freq: Two times a day (BID) | INTRAMUSCULAR | Status: AC
  Administered 2019-11-21 (×2): 10 mg via INTRAVENOUS
  Filled 2019-11-21 (×2): qty 2

## 2019-11-21 NOTE — Plan of Care (Signed)
Poc progressing.  

## 2019-11-21 NOTE — Progress Notes (Signed)
      [address]       [place] [address]             [phone]      POD 33: S/p Esophagoscopy - Robotic assisted laparoscopy - Robotic assisted thoracoscopy - Ivor-Lewis esophagectomy - Pyloromyotomy - Laparoscopic jejunostomy tube placement 75F Intercostal nerve blockby Dr. Kipp Brood on 10/18/2019.  Subjective:  Patient continues to not feel well.  He developed chest discomfort while ambulating yesterday when he developed Atrial Fibrillation.  He also continues to have abdominal pain.  He is concerned about going home tomorrow and just isn't sure how he will be able to handle everything.  Objective: Vital signs in last 24 hours: Temp:  [98 F (36.7 C)-98.4 F (36.9 C)] 98.3 F (36.8 C) (08/01 0755) Pulse Rate:  [61-69] 67 (08/01 0755) Cardiac Rhythm: Normal sinus rhythm;Heart block (08/01 0700) Resp:  [16-19] 16 (08/01 0755) BP: (129-154)/(80-90) 154/84 (08/01 0755) SpO2:  [96 %-98 %] 96 % (08/01 0755) Weight:  [100.6 kg] 100.6 kg (08/01 0500)  Intake/Output from previous day: 07/31 0701 - 08/01 0700 In: 2353.8 [I.V.:944.3; NG/GT:1140; IV Piggyback:169.5] Out: 1920 [Urine:1920]  General appearance: alert, cooperative and no distress Heart: regular rate and rhythm Lungs: clear to auscultation bilaterally Abdomen: soft, non-tender; bowel sounds normal; no masses,  no organomegaly Extremities: extremities normal, atraumatic, no cyanosis or edema Wound: J Tube clean and dry  Lab Results: No results for input(s): WBC, HGB, HCT, PLT in the last 72 hours. BMET:  Recent Labs    11/19/19 0434  NA 133*  K 4.0  CL 99  CO2 25  GLUCOSE 162*  BUN 7  CREATININE 0.58*  CALCIUM 8.4*    PT/INR: No results for input(s): LABPROT, INR in the last 72 hours. ABG    Component Value Date/Time   PHART 7.462 (H) 10/19/2019 0510   HCO3 23.7 10/19/2019 0510   TCO2 26 10/18/2019 1640   ACIDBASEDEF 3.9 (H) 10/18/2019 1809   O2SAT 98.4 10/19/2019 0510    CBG (last 3)  Recent Labs    11/21/19 0007 11/21/19 0500 11/21/19 0754  GLUCAP 216* 251* 242*    Assessment/Plan:  1. CV- Atrial Fibrillation yesterday, currently Sinus Bradycardia this morning- currently on Lopressor 5 mg IV every 8 hours.. will need to transition to liquid regimen for J Tube at time of discharge 2. Pulm- no acute issues 3. GI- Tube feeds at 85ml/hr continuous feeding, no N/V.. hasn't moved bowels in a few days, reglan has helped previously will give 2 doses today 4. ID- on Zosyn for esophageal leak 5. Dispo- patient with some chest pain/A.fib yesterday, continue Lopressor, give IV reglan to help with GI  Motility patient uses this with good success, patient is unsure how he will be able to handle everything at home... he may benefit from SNF stay, if A. Fib recurs will consult Cardiology   LOS: 7 days    Ellwood Handler, PA-C 11/21/2019

## 2019-11-22 ENCOUNTER — Other Ambulatory Visit (HOSPITAL_COMMUNITY): Payer: BC Managed Care – PPO

## 2019-11-22 ENCOUNTER — Inpatient Hospital Stay (HOSPITAL_COMMUNITY): Payer: BC Managed Care – PPO | Admitting: Hematology

## 2019-11-22 LAB — GLUCOSE, CAPILLARY
Glucose-Capillary: 161 mg/dL — ABNORMAL HIGH (ref 70–99)
Glucose-Capillary: 203 mg/dL — ABNORMAL HIGH (ref 70–99)
Glucose-Capillary: 227 mg/dL — ABNORMAL HIGH (ref 70–99)
Glucose-Capillary: 253 mg/dL — ABNORMAL HIGH (ref 70–99)
Glucose-Capillary: 260 mg/dL — ABNORMAL HIGH (ref 70–99)
Glucose-Capillary: 297 mg/dL — ABNORMAL HIGH (ref 70–99)

## 2019-11-22 MED ORDER — INSULIN DETEMIR 100 UNIT/ML ~~LOC~~ SOLN
18.0000 [IU] | Freq: Every day | SUBCUTANEOUS | Status: DC
  Administered 2019-11-22 – 2019-11-23 (×2): 18 [IU] via SUBCUTANEOUS
  Filled 2019-11-22 (×2): qty 0.18

## 2019-11-22 MED ORDER — INSULIN STARTER KIT- PEN NEEDLES (ENGLISH)
1.0000 | Freq: Once | Status: DC
  Filled 2019-11-22: qty 1

## 2019-11-22 NOTE — Evaluation (Signed)
Physical Therapy Evaluation Patient Details Name: Steven Crane MRN: [id number] DOB: 12-07-1960 Today's Date: 11/22/2019   History of Present Illness  Pt adm 7/25 with delayed postop esophageal leak. 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 PMH -  CAD, obesity, DM, HTN, CHF, arthritis, lt tibial fx  Clinical Impression  Pt did well amb in hallway. Pt anxious about bells/alarms on IV and telemetry box. Reassured pt that HR was stable. Recommend pt continue to amb in halls while he is hospitalized. No further PT recommended.    Follow Up Recommendations No PT follow up    Equipment Recommendations  None recommended by PT    Recommendations for Other Services       Precautions / Restrictions Precautions Precautions: Other (comment) Precaution Comments: PEG      Mobility  Bed Mobility Overal bed mobility: Modified Independent             General bed mobility comments: Incr time  Transfers Overall transfer level: Modified independent Equipment used: None             General transfer comment: Incr time and effort  Ambulation/Gait Ambulation/Gait assistance: Supervision Gait Distance (Feet): 450 Feet Assistive device: None Gait Pattern/deviations: Decreased stride length Gait velocity: decr Gait velocity interpretation: 1.31 - 2.62 ft/sec, indicative of limited community ambulator General Gait Details: Steady gait. Supervision only for lines.  Stairs            Wheelchair Mobility    Modified Rankin (Stroke Patients Only)       Balance Overall balance assessment: No apparent balance deficits (not formally assessed)                                           Pertinent Vitals/Pain      Home Living Family/patient expects to be discharged to:: Private residence Living Arrangements: Spouse/significant other;Children Available Help at Discharge: Family;Available 24 hours/day Type of Home: Mobile home Home Access: Ramped  entrance     Home Layout: One level Home Equipment: Bedside commode;Crutches;Wheelchair - manual      Prior Function Level of Independence: Independent               Hand Dominance        Extremity/Trunk Assessment   Upper Extremity Assessment Upper Extremity Assessment: Generalized weakness    Lower Extremity Assessment Lower Extremity Assessment: Generalized weakness       Communication   Communication: No difficulties (low volume)  Cognition Arousal/Alertness: Awake/alert Behavior During Therapy: WFL for tasks assessed/performed;Anxious Overall Cognitive Status: Within Functional Limits for tasks assessed                                        General Comments General comments (skin integrity, edema, etc.): HR stable 70-80's    Exercises     Assessment/Plan    PT Assessment Patent does not need any further PT services  PT Problem List         PT Treatment Interventions      PT Goals (Current goals can be found in the Care Plan section)  Acute Rehab PT Goals PT Goal Formulation: All assessment and education complete, DC therapy    Frequency     Barriers to discharge        Co-evaluation  AM-PAC PT "6 Clicks" Mobility  Outcome Measure Help needed turning from your back to your side while in a flat bed without using bedrails?: None Help needed moving from lying on your back to sitting on the side of a flat bed without using bedrails?: None Help needed moving to and from a bed to a chair (including a wheelchair)?: None Help needed standing up from a chair using your arms (e.g., wheelchair or bedside chair)?: None Help needed to walk in hospital room?: None Help needed climbing 3-5 steps with a railing? : None 6 Click Score: 24    End of Session   Activity Tolerance: Patient tolerated treatment well Patient left: in chair;with call bell/phone within reach Nurse Communication: Mobility status PT Visit  Diagnosis: Other abnormalities of gait and mobility (R26.89)    Time: 4235-3614 PT Time Calculation (min) (ACUTE ONLY): 17 min   Charges:   PT Evaluation $PT Eval Low Complexity: [place] Pager [phone] [place] 11/22/2019, 2:03 PM

## 2019-11-22 NOTE — Progress Notes (Addendum)
        [address]       [place] [address]             [phone]        Subjective: Feels okay this morning.   Objective: Vital signs in last 24 hours: Temp:  [97.6 F (36.4 C)-98.3 F (36.8 C)] 98.3 F (36.8 C) (08/02 0444) Pulse Rate:  [62-70] 67 (08/02 0444) Cardiac Rhythm: Normal sinus rhythm (08/01 2051) Resp:  [15-19] 17 (08/02 0444) BP: (127-154)/(72-85) 134/72 (08/02 0444) SpO2:  [96 %-99 %] 99 % (08/02 0444) Weight:  [100.4 kg] 100.4 kg (08/02 0444)     Intake/Output from previous day: 08/01 0701 - 08/02 0700 In: 1805 [I.V.:275; NG/GT:1380; IV Piggyback:150] Out: 1925 [Urine:1925] Intake/Output this shift: No intake/output data recorded.  General appearance: alert, cooperative and no distress Heart: regular rate and rhythm, S1, S2 normal, no murmur, click, rub or gallop Lungs: clear to auscultation bilaterally Abdomen: soft, non-tender; bowel sounds normal; no masses,  no organomegaly Extremities: extremities normal, atraumatic, no cyanosis or edema Wound: clean and dry  Lab Results: No results for input(s): WBC, HGB, HCT, PLT in the last 72 hours. BMET: No results for input(s): NA, K, CL, CO2, GLUCOSE, BUN, CREATININE, CALCIUM in the last 72 hours.  PT/INR: No results for input(s): LABPROT, INR in the last 72 hours. ABG    Component Value Date/Time   PHART 7.462 (H) 10/19/2019 0510   HCO3 23.7 10/19/2019 0510   TCO2 26 10/18/2019 1640   ACIDBASEDEF 3.9 (H) 10/18/2019 1809   O2SAT 98.4 10/19/2019 0510   CBG (last 3)  Recent Labs    11/21/19 1747 11/21/19 2025 11/22/19 0002  GLUCAP 266* 264* 203*    Assessment/Plan:   POD 34: S/p Esophagoscopy - Robotic assisted laparoscopy - Robotic assisted thoracoscopy - Ivor-Lewis esophagectomy - Pyloromyotomy - Laparoscopic jejunostomy tube placement 64F Intercostal nerve blockby Dr. Kipp Brood on 10/18/2019.  1. CV-NSR in the 60s, BP well controlled 2.  Pulm-tolerating room air with good oxygen saturation 3. Renal-creatinine 0.58, electrolytes okay 4. H and H 9.4/29.0, expected acute blood loss anemia 5. Endo- blood glucose continues to be over 200. Will increase Levemir and consult diabetes educator. He is nervous about the short acting insulin shorts and SSI.   Plan: Continue tube feeds, will need at the least home health for discharge but may need a facility. Consult to diabetes educator and physical therapy. He has not walked since he went into afib over the weekend.     LOS: 8 days    Steven Crane 11/22/2019  No events. Back in sinus Tolerating tube feeds at 60 mils per hour Discontinuing antibiotics today. Will repeat CBC tomorrow Titrating insulin. Dispo planning for tomorrow.  Will go home with another week of n.p.o. Swallow study will be ordered as an outpatient.  Steven Crane

## 2019-11-22 NOTE — Progress Notes (Signed)
Inpatient Diabetes Program Recommendations  AACE/ADA: New Consensus Statement on Inpatient Glycemic Control (2015)  Target Ranges:  Prepandial:   less than 140 mg/dL      Peak postprandial:   less than 180 mg/dL (1-2 hours)      Critically ill patients:  140 - 180 mg/dL   Lab Results  Component Value Date   GLUCAP 297 (H) 11/22/2019   HGBA1C 7.8 (H) 09/21/2019    Review of Glycemic Control Results for Steven Crane, Steven Crane (MRN [id number]) as of 11/22/2019 14:48  Ref. Range 11/21/2019 20:25 11/22/2019 00:02 11/22/2019 04:43 11/22/2019 08:40 11/22/2019 12:04  Glucose-Capillary Latest Ref Range: 70 - 99 mg/dL 264 (H) 203 (H) 253 (H) 260 (H) 297 (H)   Diabetes history:  DM2 Outpatient Diabetes medications:  Levemir 50-70 units daily if CBG >150 mg/dl Metformin 500 mg BID Current orders for Inpatient glycemic control:  Novolog 0-24 units Q4H  Levemir 18 units daily   Inpatient Diabetes Recommendations:  Novolog 3 units tube feed coverage.  Stop if feeds are held or discontinued.  Note:  Spoke with patient at bedside.  He states he will be going home NPO and on continuous tube feeds.  Referral for "new to rapid insulin".    He currently takes Levemir with vial and syringe.  He states he would prefer the insulin pen for short acting insulin.  Ordered insulin starter kit.  Educated on short acting insulin and how it works.  He states he has had frequent low blood sugars lately and has adjusted his Levemir from 70-50 units depending on blood sugar.  Reviewed hypoglycemia symptoms and treatment.  As he is NPO,  would recommend using 4 oz. regular soda such as Sprite via j-tube.    Patient would like DM coordinator to return tomorrow when his daughter Aldona Bar is here to hear on short acting insulin.  Will continue to follow while inpatient.  Thank you, Reche Dixon, RN, BSN Diabetes Coordinator Inpatient Diabetes Program [phone] (team pager from 8a-5p)

## 2019-11-22 NOTE — Progress Notes (Signed)
Mobility Specialist - Progress Note   11/22/19 1418  Mobility  Activity Ambulated in hall  Level of Assistance Independent  Assistive Device None  Distance Ambulated (ft) 500 ft  Mobility Response Tolerated well  Mobility performed by Mobility specialist  $Mobility charge 1 Mobility    Pre-mobility: 85 HR, 96% SpO2 During mobility: 89 HR Post-mobility: 70 HR, 96% SpO2   Cherylann Hobday Transport planner Phone: [phone]

## 2019-11-23 LAB — GLUCOSE, CAPILLARY
Glucose-Capillary: 177 mg/dL — ABNORMAL HIGH (ref 70–99)
Glucose-Capillary: 177 mg/dL — ABNORMAL HIGH (ref 70–99)
Glucose-Capillary: 219 mg/dL — ABNORMAL HIGH (ref 70–99)
Glucose-Capillary: 219 mg/dL — ABNORMAL HIGH (ref 70–99)
Glucose-Capillary: 223 mg/dL — ABNORMAL HIGH (ref 70–99)
Glucose-Capillary: 229 mg/dL — ABNORMAL HIGH (ref 70–99)
Glucose-Capillary: 229 mg/dL — ABNORMAL HIGH (ref 70–99)
Glucose-Capillary: 231 mg/dL — ABNORMAL HIGH (ref 70–99)

## 2019-11-23 LAB — CBC
HCT: 30.2 % — ABNORMAL LOW (ref 39.0–52.0)
Hemoglobin: 9.8 g/dL — ABNORMAL LOW (ref 13.0–17.0)
MCH: 30 pg (ref 26.0–34.0)
MCHC: 32.5 g/dL (ref 30.0–36.0)
MCV: 92.4 fL (ref 80.0–100.0)
Platelets: 288 10*3/uL (ref 150–400)
RBC: 3.27 MIL/uL — ABNORMAL LOW (ref 4.22–5.81)
RDW: 13.2 % (ref 11.5–15.5)
WBC: 7.8 10*3/uL (ref 4.0–10.5)
nRBC: 0 % (ref 0.0–0.2)

## 2019-11-23 MED ORDER — METOPROLOL TARTRATE 25 MG PO TABS
25.0000 mg | ORAL_TABLET | Freq: Two times a day (BID) | ORAL | 1 refills | Status: AC
Start: 2019-11-23 — End: ?

## 2019-11-23 MED ORDER — PANTOPRAZOLE SODIUM 40 MG PO PACK: 40.0000 mg | PACK | Freq: Every day | ORAL | 1 refills | Status: DC

## 2019-11-23 MED ORDER — INSULIN ASPART 100 UNIT/ML ~~LOC~~ SOLN: 3.0000 [IU] | Freq: Three times a day (TID) | SUBCUTANEOUS | 11 refills | Status: DC

## 2019-11-23 MED ORDER — INSULIN ASPART 100 UNIT/ML FLEXPEN
PEN_INJECTOR | SUBCUTANEOUS | 0 refills | Status: AC
Start: 2019-11-23 — End: ?

## 2019-11-23 MED ORDER — INSULIN DETEMIR 100 UNIT/ML ~~LOC~~ SOLN: 20.0000 [IU] | Freq: Every day | SUBCUTANEOUS | 11 refills | Status: AC

## 2019-11-23 MED ORDER — ACETAMINOPHEN 160 MG/5ML PO SOLN: 325.0000 mg | ORAL | 0 refills | Status: AC | PRN

## 2019-11-23 MED ORDER — OSMOLITE 1.5 CAL PO LIQD: 1000.0000 mL | ORAL | 0 refills | Status: AC

## 2019-11-23 MED ORDER — "PEN NEEDLES 3/16"" 31G X 5 MM MISC": 3.0000 [IU] | Freq: Three times a day (TID) | 1 refills | Status: DC

## 2019-11-23 MED ORDER — INSULIN ASPART 100 UNIT/ML FLEXPEN: 3.0000 [IU] | PEN_INJECTOR | Freq: Three times a day (TID) | SUBCUTANEOUS | 1 refills | Status: DC

## 2019-11-23 MED ORDER — INSULIN ASPART 100 UNIT/ML ~~LOC~~ SOLN
3.0000 [IU] | Freq: Three times a day (TID) | SUBCUTANEOUS | Status: DC
  Administered 2019-11-23 (×3): 3 [IU] via SUBCUTANEOUS

## 2019-11-23 MED ORDER — INSULIN ASPART 100 UNIT/ML ~~LOC~~ SOLN: 0.0000 [IU] | Freq: Three times a day (TID) | SUBCUTANEOUS | 11 refills | Status: DC

## 2019-11-23 MED ORDER — INSULIN STARTER KIT- PEN NEEDLES (ENGLISH): 1.0000 | Freq: Once | 0 refills | Status: AC

## 2019-11-23 MED ORDER — OXYCODONE HCL 5 MG/5ML PO SOLN: 5.0000 mg | Freq: Four times a day (QID) | ORAL | 0 refills | Status: AC | PRN

## 2019-11-23 MED ORDER — PROSOURCE TF PO LIQD: 45.0000 mL | Freq: Three times a day (TID) | ORAL | 0 refills | Status: AC

## 2019-11-23 NOTE — Progress Notes (Signed)
Discharge instructions reviewed with patient and patient's daughter. Reviewed all medications and tube feed instructions. Patient verbalized understanding. All questions answered.

## 2019-11-23 NOTE — Plan of Care (Signed)
Discharge to home °

## 2019-11-23 NOTE — TOC Transition Note (Signed)
Transition of Care [facility]) - CM/SW Discharge Note   Patient Details  Name: Steven Crane MRN: [id number] Date of Birth: 1960/10/23  [facility] CM/SW Contact:  Bartholomew Crews, RN Phone Number: [phone] 11/23/2019, 4:45 PM   Clinical Narrative:     Patient to transition home today. Verified active with Alvis Lemmings for [facility] RN. [place] RN orders placed by MD for resumption of services. [employer] active for TF - notified of need for new order for TF. Order signed by provider and emailed to [employer] - receipt confirmed. Spoke with patient at bedside - confirmed having TF pump. Family to provide transportation home following family meeting with diabetes coordinator. No further TOC needs identified.   Final next level of care: [place] Barriers to Discharge: No Barriers Identified   Patient Goals and CMS Choice Patient states their goals for this hospitalization and ongoing recovery are:: return home CMS Medicare.gov Compare Post Acute Care list provided to:: Patient Choice offered to / list presented to : Patient  Discharge Placement                       Discharge Plan and Services                DME Arranged: Tube feeding DME Agency: [employer] Date DME Agency Contacted: 11/23/19 Time DME Agency Contacted: 1400 Representative spoke with at DME Agency: [place]: RN [place] Agency: [place] Date [place]: 11/23/19 Time [place]: 1030 Representative spoke with at [place]: [place] ([place]) Interventions     Readmission Risk Interventions Readmission Risk Prevention Plan 10/25/2019  Transportation Screening Complete  PCP or Specialist Appt within 5-7 Days Complete  Home Care Screening Complete  Medication Review (RN CM) Complete  Some recent data might be hidden

## 2019-11-23 NOTE — Progress Notes (Signed)
Nutrition Brief Note  Spoke with RN. Patient unable to reach tube feeding goal rate of 70 ml/hr without feeling bloating/nausea. Only willing to advance to 60 ml/hr with Prosource TF TID (2280 kcal, 123 g protein, meets 89% of kcal needs and 98% of protein needs) States at home he was unable to tolerate rate of 65 ml/hr despite taking 5 mg Reglan QID.   Encouraged patient to increase rate to 65 ml/hr once at home, establish tolerance, and then proceed to goal rate of 70 ml/hr. Patient hesitant to try. Recommend continuing scheduled Reglan at home.   Discussed with patient that all nutrition needs (including hydration) will need to be met via feeding tube as he will remain NPO until next TCTS follow up/swallow evaluation. Nurse to teach pt how to administer free water flushes.   Patient's goal regimen: -Osmolite 1.5 @ 70 ml/hr via J-tube (1680 ml) -45 ml ProSourceTF or other protein modular TID -Free water flushes 200 ml Q6 hours (800 ml)  Provides 2640 kcals, 138 g of protein and 1277 mL of free water (2077 ml total water)  Mariana Single RD, LDN Clinical Nutrition Pager listed in [place]

## 2019-11-23 NOTE — Discharge Instructions (Signed)
Esophagectomy, Care After  This sheet gives you information about how to care for yourself after your procedure. Your health care provider may also give you more specific instructions. If you have problems or questions, contact your health care provider. What can I expect after the procedure? After the procedure, it is common to have:  Soreness near the incision sites.  Soreness when swallowing food.  Tiredness (fatigue).  Loss of appetite.  Nausea.  Heartburn.  Diarrhea. Follow these instructions at home: Feeding tube  If you were sent home with a feeding tube, follow instructions from your health care provider about taking care of the tube.  Work with your nutrition care provider for using your feeding tube and getting enough nutrition. Eating and drinking  Follow instructions from your health care provider about eating or drinking restrictions. NPO until swallow study.   NPO Activity  Do not lift anything that is heavier than 10 lb (4.5 kg) until your surgeon says you can.  Do not drive or operate heavy machinery while taking prescription pain medicine.  Return to your normal activities as told by your health care provider. Ask your health care provider what activities are safe for you. Incision care   Follow instructions from your health care provider about how to take care of your incision. Make sure you: ? Change your bandage (dressing) as told by your health care provider. ? Wash your hands with soap and water before you change your dressing. If soap and water are not available, use hand sanitizer. ? Leave stitches (sutures), skin glue, or adhesive strips in place. These skin closures may need to stay in place for 2 weeks or longer. If adhesive strip edges start to loosen and curl up, you may trim the loose edges. Do not remove adhesive strips completely unless your health care provider tells you to do that.  Do not take baths, swim, or use a hot tub until your  health care provider approves  Check your incision areas every day for signs of infection. Check for: ? More redness, swelling, or pain. ? More fluid or blood. ? Warmth. ? Pus or a bad smell. General instructions  To prevent or treat constipation while you are taking prescription pain medicine, your health care provider may recommend that you: ? Take over-the-counter or prescription medicines. ? Eat foods that are high in fiber, such as fresh fruits and vegetables, whole grains, and beans. ? Limit foods that are high in fat and processed sugars, such as fried and sweet foods.  Take over-the-counter and prescription medicines only as told by your health care provider.  Wear compression stockings as told by your health care provider. These stockings help prevent blood clots and reduce swelling in your legs.  Do not use any products that contain nicotine or tobacco, such as cigarettes and e-cigarettes. If you need help quitting, ask your health care provider.  Keep all follow-up visits as told by your health care provider. This is important. Contact a health care provider if:  You have problems or questions about your feeding tube.  You have chills or fever.  You have pain that is not controlled by your pain medicine.  You have trouble swallowing.  You have persistent or worsening heartburn.  You have persistent or worsening diarrhea.  You have more redness, swelling, or pain around your incision area.  You have more fluid coming from your incision area.  Your incision feels warm to the touch.  You have pus or a  bad smell coming from your incision area.  You have a persistent or worsening cough. Get help right away if:  You have severe pain.  You are bleeding from an incision area.  You are vomiting blood.  You are unable to swallow.  You have trouble breathing.  You have chest pain. Summary  Expect some discomfort and fatigue during your recovery.  Work with  your nutrition care provider if you still have a feeding tube at home.  Follow your surgeons directions for care of your incision areas.  Return to normal activities gradually, as told by your health care provider. This information is not intended to replace advice given to you by your health care provider. Make sure you discuss any questions you have with your health care provider. Document Revised: 03/21/2017 Document Reviewed: 12/07/2015 Elsevier Patient Education  [place].  Insulin Aspart injection What is this medicine? INSULIN ASPART (IN su lin AS part) is a human-made form of insulin. This drug lowers the amount of sugar in your blood. It is a fast acting insulin that starts working faster than regular insulin. It will not work as long as regular insulin. This medicine may be used for other purposes; ask your health care provider or pharmacist if you have questions. COMMON BRAND NAME(S): Fiasp, [employer], Medtronic, NovoLog, NovoLog Flexpen, NovoLog PenFill What should I tell my health care provider before I take this medicine? They need to know if you have any of these conditions:  episodes of low blood sugar  eye disease, vision problems  kidney disease  liver disease  an unusual or allergic reaction to insulin, metacresol, other medicines, foods, dyes, or preservatives  pregnant or trying to get pregnant  breast-feeding How should I use this medicine? This medicine is for injection under the skin. Use exactly as directed. It is important to follow the directions given to you by your health care professional or doctor. If you are using Novolog, you should start your meal within 5 to 10 minutes after injection. If you are using Fiasp, you should start your meal at the time of injection or within 20 minutes after injection. Have food ready before injection. Do not delay eating. You will be taught how to use this medicine and how to adjust doses for  activities and illness. Do not use more insulin than prescribed. Do not use more or less often than prescribed. Always check the appearance of your insulin before using it. This medicine should be clear and colorless like water. Do not use if it is cloudy, thickened, colored, or has solid particles in it. If you use a pen, be sure to take off the outer needle cover before using the dose. It is important that you put your used needles and syringes in a special sharps container. Do not put them in a trash can. If you do not have a sharps container, call your pharmacist or healthcare provider to get one. This drug comes with INSTRUCTIONS FOR USE. Ask your pharmacist for directions on how to use this drug. Read the information carefully. Talk to your pharmacist or health care provider if you have questions. Talk to your pediatrician regarding the use of this medicine in children. While this drug may be prescribed for children as young as 2 years for selected conditions, precautions do apply. Overdosage: If you think you have taken too much of this medicine contact a poison control center or emergency room at once. NOTE: This medicine is only  for you. Do not share this medicine with others. What if I miss a dose? It is important not to miss a dose. Your health care professional or doctor should discuss a plan for missed doses with you. If you do miss a dose, follow their plan. Do not take double doses. What may interact with this medicine?  other medicines for diabetes Many medications may cause an increase or decrease in blood sugar, these include:  alcohol containing beverages  antiviral medicines for HIV or AIDS  aspirin and aspirin-like drugs  certain medicines for depression, anxiety, or psychotic disturbances  chromium  diuretics  male hormones, like estrogens or progestins and birth control pills  heart medicines  isoniazid  MAOIs like Carbex, Eldepryl, Marplan, Nardil, and  Parnate  male hormones or anabolic steroids  medicines for weight loss  medicines for allergies, asthma, cold, or cough  niacin  NSAIDs, medicines for pain and inflammation, like ibuprofen or naproxen  octreotide  pentamidine  phenytoin  probenecid  quinolone antibiotics like ciprofloxacin, levofloxacin, ofloxacin  some herbal dietary supplements  steroid medicines like prednisone or cortisone  sulfamethoxazole; trimethoprim  thyroid medicine Some medications can hide the warning symptoms of low blood sugar. You may need to monitor your blood sugar more closely if you are taking one of these medications. These include:  beta-blockers such as atenolol, metoprolol, propranolol  clonidine  guanethidine  reserpine This list may not describe all possible interactions. Give your health care provider a list of all the medicines, herbs, non-prescription drugs, or dietary supplements you use. Also tell them if you smoke, drink alcohol, or use illegal drugs. Some items may interact with your medicine. What should I watch for while using this medicine? Visit your health care professional or doctor for regular checks on your progress. A test called the HbA1C (A1C) will be monitored. This is a simple blood test. It measures your blood sugar control over the last 2 to 3 months. You will receive this test every 3 to 6 months. Learn how to check your blood sugar. Learn the symptoms of low and high blood sugar and how to manage them. Always carry a quick-source of sugar with you in case you have symptoms of low blood sugar. Examples include hard sugar candy or glucose tablets. Make sure others know that you can choke if you eat or drink when you develop serious symptoms of low blood sugar, such as seizures or unconsciousness. They must get medical help at once. Tell your doctor or health care professional if you have high blood sugar. You might need to change the dose of your medicine. If  you are sick or exercising more than usual, you might need to change the dose of your medicine. Do not skip meals. Ask your doctor or health care professional if you should avoid alcohol. Many nonprescription cough and cold products contain sugar or alcohol. These can affect blood sugar. Make sure that you have the right kind of syringe for the type of insulin you use. Try not to change the brand and type of insulin or syringe unless your health care professional or doctor tells you to. Switching insulin brand or type can cause dangerously high or low blood sugar. Always keep an extra supply of insulin, syringes, and needles on hand. Use a syringe one time only. Throw away syringe and needle in a closed container to prevent accidental needle sticks. Insulin pens and cartridges should never be shared. Even if the needle is changed, sharing may  result in passing of viruses like hepatitis or HIV. Each time you get a new box of pen needles, check to see if they are the same type as the ones you were trained to use. If not, ask your health care professional to show you how to use this new type properly. Wear a medical ID bracelet or chain, and carry a card that describes your disease and details of your medicine and dosage times. What side effects may I notice from receiving this medicine? Side effects that you should report to your doctor or health care professional as soon as possible:  allergic reactions like skin rash, itching or hives, swelling of the face, lips, or tongue  breathing problems  signs and symptoms of high blood sugar such as dizziness, dry mouth, dry skin, fruity breath, nausea, stomach pain, increased hunger or thirst, increased urination  signs and symptoms of low blood sugar such as feeling anxious, confusion, dizziness, increased hunger, unusually weak or tired, sweating, shakiness, cold, irritable, headache, blurred vision, fast heartbeat, loss of consciousness Side effects that  usually do not require medical attention (report to your doctor or health care professional if they continue or are bothersome):  increase or decrease in fatty tissue under the skin due to overuse of a particular injection site  itching, burning, swelling, or rash at site where injected This list may not describe all possible side effects. Call your doctor for medical advice about side effects. You may report side effects to FDA at 1-800-FDA-1088. Where should I keep my medicine? Keep out of the reach of children. Unopened Vials: Novolog Vials: Store in a refrigerator between 2 and 8 degrees C (36 and 46 degrees F) or at room temperature below 30 degrees C (86 degrees F). Do not freeze or use if the insulin has been frozen. Protect from light and excessive heat. If stored at room temperature, the vial must be discarded after 28 days. Throw away any unopened and unused medicine that has been stored in the refrigerator after the expiration date. Fiasp Vials: Store in a refrigerator between 2 and 8 degrees C (36 and 46 degrees F) or at room temperature below 30 degrees C (86 degrees F). Do not freeze or use if the insulin has been frozen. Protect from light and excessive heat. If stored at room temperature, the vial must be discarded after 28 days. Throw away any unopened and unused medicine that has been stored in the refrigerator after the expiration date. Unopened Pens and Cartridges: Novolog Flexpens and cartridges: Store in a refrigerator between 2 and 8 degrees C (36 and 46 degrees F) or at room temperature below 30 degrees C (86 degrees F). Do not freeze or use if the insulin has been frozen. Protect from light and excessive heat. If stored at room temperature, the pen or cartridge must be discarded after 28 days. Throw away any unopened and unused medicine that has been stored in the refrigerator after the expiration date. Fiasp FlexTouch pens: Store in a refrigerator between 2 and 8 degrees C (36  and 46 degrees F) or at room temperature below 30 degrees C (86 degrees F). Do not freeze or use if the insulin has been frozen. Protect from light and excessive heat. If stored at room temperature, the pen must be discarded after 28 days. Throw away any unopened and unused medicine that has been stored in the refrigerator after the expiration date. Fiasp FlexTouch cartridges: Store at room temperature below 30 degrees C (86  degrees F). Do not refrigerate or freeze. Keep away from heat and light. Throw the cartridge away after 28 days, even if it still has insulin left in it. Vials that you are using: Novolog Vials: Store in the refrigerator or at room temperature below 30 degrees C (86 degrees F). Do not freeze. Keep away from heat and light. Throw the opened vial away after 28 days. Fiasp Vials: Store in the refrigerator or at room temperature below 30 degrees C (86 degrees F). Do not freeze. Keep away from heat and light. Throw the opened vial away after 28 days. Pens and cartridges that you are using: Novolog Flexpens and cartridges: Store at room temperature below 30 degrees C (86 degrees F). Do not refrigerate or freeze. Keep away from heat and light. Throw away the pen or cartridge after 28 days, even if it still has insulin left in it. Fiasp FlexTouch pens: Store in the refrigerator or at room temperature below 30 degrees C (86 degrees F). Do not freeze. Keep away from heat and light. Throw the pen away after 28 days, even if it still has insulin left in it. Fiasp FlexTouch cartridges: Store at room temperature below 30 degrees C (86 degrees F). Do not refrigerate or freeze. Keep away from heat and light. Throw the cartridge away after 28 days, even if it still has insulin left in it. NOTE: This sheet is a summary. It may not cover all possible information. If you have questions about this medicine, talk to your doctor, pharmacist, or health care provider.  2020 Elsevier/Gold Standard (2018-12-22  08:02:23)   Jejunostomy Tube Home Guide, Adult A gastrostomy tube, or G-tube, is a tube that is inserted through the abdomen into the stomach. The tube is used to give feedings and medicines when a person is unable to eat and drink enough on his or her own. How to care for a J-tube Supplies needed  Saline solution or clean, warm water and soap.  Cotton swab or gauze.  Precut gauze bandage (dressing) and tape, if needed. Instructions 1. Wash your hands with soap and water. 2. If there is a dressing between the person's skin and the tube, remove it. 3. Check the area where the tube enters the skin. Check for problems such as: ? Redness. ? Swelling. ? Pus-like drainage. ? Extra skin growth. 4. Moisten the cotton swab with the saline solution or soap and water mixture. Gently clean around the insertion site. Remove any drainage or crusting. ? When the G-tube is first put in, a normal saline solution or water can be used to clean the skin. ? Mild soap and warm water can be used when the skin around the G-tube site has healed. 5. If there should be a dressing between the person's skin and the tube, apply it at this time. How to flush a J-tube Flush the J-tube regularly to keep it from clogging. Flush it before and after feedings and as often as told by the health care provider. Supplies needed  Purified or sterile water, warmed. If the person has a weak disease-fighting (immune) system, or if he or she has difficulty fighting off infections (is immunocompromised), use only sterile water. ? If you are unsure about the amount of chemical contaminants in purified or drinking water, use sterile water. ? To purify drinking water by boiling:  Boil water for at least 1 minute. Keep lid over water while it boils. Allow water to cool to room temperature before using.  60cc  J-tube syringe. Instructions 1. Wash your hands with soap and water. 2. Draw up 30 mL of warm water in a  syringe. 3. Connect the syringe to the tube. 4. Slowly and gently push the water into the tube. G-tube problems and solutions  If the tube comes out: ? Cover the opening with a clean dressing and tape. ? Call a health care provider right away. ? A health care provider will need to put the tube back in within 4 hours.  If there is skin or scar tissue growing where the tube enters the skin: ? Keep the area clean and dry. ? Secure the tube with tape so that the tube does not move around too much. ? Call a health care provider.  If the tube gets clogged: ? Slowly push warm water into the tube with a large syringe. ? Do not force the fluid into the tube or push an object into the tube. ? If you are not able to unclog the tube, call a health care provider right away. Follow these instructions at home: Feedings  Give feedings at room temperature.  Cover and place unused feedings in the refrigerator.  If feedings are continuous: ? Do not put more than 4 hours worth of feedings in the feeding bag. ? Stop the feedings when you need to give medicine or flush the tube. Be sure to restart the feedings. ? Make sure the person's head is above his or her stomach (upright position). This will prevent choking and discomfort.  Replace feeding bags and syringes as told by the health care provider.  Make sure the person is in the right position during and after feedings: ? During feedings, the person's position should be in the upright position. ? After a noncontinuous feeding (bolus feeding), have the person stay in the upright position for 1 hour. General instructions  Only use syringes made for J-tubes.  Do not pull or put tension on the tube.  Clamp the tube before removing the cap or disconnecting a syringe.  Measure the length of the J-tube every day from the insertion site to the end of the tube.  If the person's J-tube has a balloon, check the fluid in the balloon every week. The  amount of fluid that should be in the balloon can be found in the manufacturers specifications.  Make sure the person takes care of his or her oral health, such as by brushing his or her teeth.  Remove excess air from the J-tube as told by the person's health care provider. This is called "venting."  Keep the area where the tube enters the skin clean and dry.  Do not push feedings, medicines, or flushes rapidly. Contact a health care provider if:  The person with the tube has any of these problems: ? Constipation. ? Fever.  There is a large amount of fluid or mucus-like liquid leaking from the tube.  Skin or scar tissue appears to be growing where the tube enters the skin.  The length of tube from the insertion site to the G-tube gets longer. Get help right away if:  The person with the tube has any of these problems: ? Severe abdominal pain. ? Severe tenderness. ? Severe bloating. ? Nausea. ? Vomiting. ? Trouble breathing. ? Shortness of breath.  Any of these problems happen in the area where the tube enters the skin: ? Redness, irritation, swelling, or soreness. ? Pus-like discharge. ? A bad smell.  The tube is clogged and cannot  be flushed.  The tube comes out. Summary  A jejunostomy tube, or J-tube, is a tube that is inserted through the abdomen into the stomach. The tube is used to give feedings and medicines when a person is unable to eat and drink enough on his or her own.  Check and clean the insertion site daily as told by the person's health care provider.  Flush the J-tube regularly to keep it from clogging. Flush it before and after feedings and as often as told by the person's health care provider.  Keep the area where the tube enters the skin clean and dry. This information is not intended to replace advice given to you by your health care provider. Make sure you discuss any questions you have with your health care provider. Document Revised: 03/21/2017  Document Reviewed: 06/03/2016 Elsevier Patient Education  2020 [employer].

## 2019-11-23 NOTE — Progress Notes (Signed)
Spoke with Wendi who says that patient's home health has been resumed with prior agency before admission. Spoke with diabetic coordinator, Abigail Butts who will meet with family for diabetes education. Patient states family will be here in about an hour.

## 2019-11-23 NOTE — Progress Notes (Signed)
      [address]       [place] [address]             [phone]          Subjective: He states he has had trouble with his j-tube. The nurses had to do some warm flushes to unclog the tube.   Objective: Vital signs in last 24 hours: Temp:  [97.8 F (36.6 C)-99 F (37.2 C)] 98.4 F (36.9 C) (08/03 0355) Pulse Rate:  [57-69] 57 (08/03 0355) Cardiac Rhythm: Heart block (08/02 1926) Resp:  [16-18] 16 (08/03 0355) BP: (139-145)/(71-82) 145/80 (08/03 0355) SpO2:  [95 %-98 %] 96 % (08/03 0355) Weight:  [101.4 kg] 101.4 kg (08/03 0355)     Intake/Output from previous day: 08/02 0701 - 08/03 0700 In: 0  Out: 1300 [Urine:1300] Intake/Output this shift: No intake/output data recorded.  General appearance: alert, cooperative and no distress Heart: regular rate and rhythm, S1, S2 normal, no murmur, click, rub or gallop and sinus brady Lungs: clear to auscultation bilaterally Abdomen: soft, non-tender; bowel sounds normal; no masses,  no organomegaly Extremities: extremities normal, atraumatic, no cyanosis or edema Wound: clean and dry  Lab Results: Recent Labs    11/23/19 0350  WBC 7.8  HGB 9.8*  HCT 30.2*  PLT 288   BMET: No results for input(s): NA, K, CL, CO2, GLUCOSE, BUN, CREATININE, CALCIUM in the last 72 hours.  PT/INR: No results for input(s): LABPROT, INR in the last 72 hours. ABG    Component Value Date/Time   PHART 7.462 (H) 10/19/2019 0510   HCO3 23.7 10/19/2019 0510   TCO2 26 10/18/2019 1640   ACIDBASEDEF 3.9 (H) 10/18/2019 1809   O2SAT 98.4 10/19/2019 0510   CBG (last 3)  Recent Labs    11/22/19 2027 11/23/19 0006 11/23/19 0354  GLUCAP 161* 177* 229*    Assessment/Plan: POD 35: S/p Esophagoscopy - Robotic assisted laparoscopy - Robotic assisted thoracoscopy - Ivor-Lewis esophagectomy - Pyloromyotomy - Laparoscopic jejunostomy tube placement 30F Intercostal nerve blockby Dr. Kipp Brood on 10/18/2019.  1. CV-NSR  in the 60s, BP well controlled 2. Pulm-tolerating room air with good oxygen saturation 3. Renal-creatinine 0.58, electrolytes okay 4. H and H 9.8/30.2, expected acute blood loss anemia 5. Endo- blood glucose continues to be over 200. Continue Levemir, added Novolog 3 units TID for tube feeding coverage. Continue SSI. Diabetic educator to talk to patient and family today to review short acting insulin. TF at goal ( 70/hr)with prosource TID.   Plan: Will remain NPO for another week and swallow study will be arranged outpatient. CBC stable today. Will discharge once all home health needs are set up and Diabetes educator has had a chance to speak with family.     LOS: 9 days    Steven Crane 11/23/2019

## 2019-11-23 NOTE — Plan of Care (Signed)
Continue to monitor

## 2019-11-23 NOTE — Progress Notes (Signed)
Inpatient Diabetes Program Recommendations  AACE/ADA: New Consensus Statement on Inpatient Glycemic Control (2015)  Target Ranges:  Prepandial:   less than 140 mg/dL      Peak postprandial:   less than 180 mg/dL (1-2 hours)      Critically ill patients:  140 - 180 mg/dL   Lab Results  Component Value Date   GLUCAP 223 (H) 11/23/2019   HGBA1C 7.8 (H) 09/21/2019    Review of Glycemic Control  Educated patient, GF and daughter on insulin pen use at home. Reviewed contents of insulin flexpen starter kit. Reviewed all steps if insulin pen including attachment of needle, 2-unit air shot, dialing up dose, giving injection, removing needle, disposal of sharps, storage of unused insulin, disposal of insulin etc.Also reviewed troubleshooting with insulin pen. MD to give patient Rxs for insulin pens and insulin pen needles.  To be discharged on:  Levemir 20 units QHS Novolog Flexpen 3 units tid + 0-24 units tid  Discussed use of glucose gel for hypoglycemia. To f/u with PCP within a week or two of discharge.  Thank you. Lorenda Peck, RD, LDN, CDE Inpatient Diabetes Coordinator [phone]

## 2019-11-24 ENCOUNTER — Telehealth: Payer: Self-pay | Admitting: Family Medicine

## 2019-11-24 NOTE — Telephone Encounter (Signed)
Pt recently discharged from hospital Was told to call here for a follow up appt - only a few same day spots left next week  Please advise on when to schedule pt

## 2019-11-25 NOTE — Telephone Encounter (Signed)
Patient is wanting to know if he can do virtual visit instead of coming in. Because he has a feeding tube and colostomy bag and very difficult to get around now.Please advise

## 2019-11-25 NOTE — Telephone Encounter (Signed)
3;50pm on Tuesday appt if he can come.  Thx. Dr. Lovena Le

## 2019-11-26 ENCOUNTER — Ambulatory Visit (INDEPENDENT_AMBULATORY_CARE_PROVIDER_SITE_OTHER): Payer: Self-pay | Admitting: Thoracic Surgery (Cardiothoracic Vascular Surgery)

## 2019-11-26 ENCOUNTER — Other Ambulatory Visit: Payer: Self-pay

## 2019-11-26 ENCOUNTER — Other Ambulatory Visit: Payer: BC Managed Care – PPO

## 2019-11-26 ENCOUNTER — Encounter: Payer: Self-pay | Admitting: Thoracic Surgery (Cardiothoracic Vascular Surgery)

## 2019-11-26 VITALS — BP 160/80 | HR 67 | Temp 97.6°F | Resp 20 | Ht 74.0 in | Wt 222.0 lb

## 2019-11-26 DIAGNOSIS — C160 Malignant neoplasm of cardia: Secondary | ICD-10-CM

## 2019-11-26 NOTE — Telephone Encounter (Signed)
Patient is scheduled for phone visit on 8/10 at 3:50

## 2019-11-26 NOTE — Telephone Encounter (Signed)
Yes that's fine. Thx. Dr. Darene Lamer

## 2019-11-26 NOTE — Progress Notes (Signed)
     [address]       [place] [address]             [phone]       Mr. Seier comes in for his 1 week appointment following discharge.  He underwent an Ivor Lewis esophagectomy.  At his original operation he was discharged on postoperative day 6 without any issues.  He subsequently represented 2 weeks later with evidence of contained esophageal perforation.  He was admitted to the hospital for 1week during that time his tube feeds were increased and he was kept n.p.o.  Today continues to complain of some upper abdominal pain and lethargy.  He has not been very active, and mostly spends all day in a chair.  He is tolerating his tube feeds at goal without any issues.  He denies any fevers or chills.  I restitched his J-tube.  It looked uninfected.  I also recommended that he attempt to be active for 20 minutes every day to increase his endurance.  He is scheduled for repeat swallow study on August 18.  If this is good will allow him to start p.o. intake again.  I will see him back in clinic in 2 weeks.  Ruslan Mccabe Bary Leriche

## 2019-11-29 ENCOUNTER — Ambulatory Visit: Payer: BC Managed Care – PPO | Admitting: Urology

## 2019-11-30 ENCOUNTER — Telehealth: Payer: Self-pay | Admitting: Family Medicine

## 2019-11-30 ENCOUNTER — Telehealth (INDEPENDENT_AMBULATORY_CARE_PROVIDER_SITE_OTHER): Payer: BC Managed Care – PPO | Admitting: Family Medicine

## 2019-11-30 ENCOUNTER — Other Ambulatory Visit: Payer: Self-pay

## 2019-11-30 ENCOUNTER — Telehealth: Payer: Self-pay

## 2019-11-30 ENCOUNTER — Encounter: Payer: Self-pay | Admitting: Family Medicine

## 2019-11-30 DIAGNOSIS — E0865 Diabetes mellitus due to underlying condition with hyperglycemia: Secondary | ICD-10-CM

## 2019-11-30 DIAGNOSIS — C159 Malignant neoplasm of esophagus, unspecified: Secondary | ICD-10-CM

## 2019-11-30 MED ORDER — PANTOPRAZOLE SODIUM 40 MG PO PACK: PACK | ORAL | 2 refills | Status: AC

## 2019-11-30 MED ORDER — PANTOPRAZOLE SODIUM 40 MG PO PACK: PACK | ORAL | 2 refills | Status: DC

## 2019-11-30 NOTE — Telephone Encounter (Signed)
protoinx RX faxed to [place]

## 2019-11-30 NOTE — Telephone Encounter (Signed)
[name], clapper are scheduled for a virtual visit with your provider today.    Just as we do with appointments in the office, we must obtain your consent to participate.  Your consent will be active for this visit and any virtual visit you may have with one of our providers in the next 365 days.    If you have a MyChart account, I can also send a copy of this consent to you electronically.  All virtual visits are billed to your insurance company just like a traditional visit in the office.  As this is a virtual visit, video technology does not allow for your provider to perform a traditional examination.  This may limit your provider's ability to fully assess your condition.  If your provider identifies any concerns that need to be evaluated in person or the need to arrange testing such as labs, EKG, etc, we will make arrangements to do so.    Although advances in technology are sophisticated, we cannot ensure that it will always work on either your end or our end.  If the connection with a video visit is poor, we may have to switch to a telephone visit.  With either a video or telephone visit, we are not always able to ensure that we have a secure connection.   I need to obtain your verbal consent now.   Are you willing to proceed with your visit today?   Steven Crane has provided verbal consent on 11/30/2019 for a virtual visit (video or telephone).   Vicente Males, LPN 05/06/7260  0:35 PM

## 2019-11-30 NOTE — Progress Notes (Signed)
Patient ID: Steven Crane, male    DOB: 19-Jun-1960, 59 y.o.   MRN: [id number]   Virtual Visit via Telephone Note  I connected with Steven Crane on 11/30/19 at  3:50 PM EDT by telephone and verified that I am speaking with the correct person using two identifiers.  Location: Patient: home Provider: office   I discussed the limitations, risks, security and privacy concerns of performing an evaluation and management service by telephone and the availability of in person appointments. I also discussed with the patient that there may be a patient responsible charge related to this service. The patient expressed understanding and agreed to proceed. Chief Complaint  Patient presents with  . Hospitalization Follow-up   Subjective:    HPI   Pt had phone visit to f/u from recent hospitalization for ruptured esophagus with esophageal carcinoma. Pt was in [facility] from 11/13/19-11/23/19. Pt now has feeding tube and colostomy bag.  Pt states that his sugars have been running high. He is having to check sugar 4-5 times a day. Has to check it in the middle of the night so his sugar is not over 300 in the mornings.  Admit date: 11/13/2019 Discharge date: 11/23/2019 Was admitted due to Esophageal perforation.  Had repair done and dc to home.  DM2- Checking bg 3x-5x per day and seeing 200-300s.  Taking 20 units levemir and gave novolog sliding scale.  They stopped oral diabetic meds in hospital. Taking metoprolol 33m bid. protonix taking it in j-tube.  bg readings for last 2 days. today- 258, then took 15 units novolog. levemire 20 units in am. 221- 14 units,  189- then 7 units. novolog  Last night- 176 at 8pm,  212, 254, 258, 236, 333.  -levemir taking it in am.  Novolog dosing ranging from 7 units to 15 units during the day.   Medical History BFabriciohas a past medical history of Anxiety, Arthritis, Asthma, CHF (congestive heart failure) ([facility] (02/2010), Chronic bronchitis ([facility], Chronic  lower back pain, Closed head injury ((6333, Coronary artery disease (May 2015), Depression, DVT (deep venous thrombosis) ([facility] (2008), Esophageal cancer ([facility], GERD (gastroesophageal reflux disease), Headache, History of kidney stones, Hyperlipemia, Hypertension, Hypertriglyceridemia, LV dysfunction, Morbid obesity ([facility], Myocardial infarction ([facility] (02/2010), Psoriasis, and Type II diabetes mellitus ([facility].   Outpatient Encounter Medications as of 11/30/2019  Medication Sig  . acetaminophen (TYLENOL) 160 MG/5ML solution Place 10.2-20.3 mLs (325-650 mg total) into feeding tube every 4 (four) hours as needed for mild pain or moderate pain.  . blood glucose meter kit and supplies Test glucose once daily. ICD 10 E11.9  . insulin aspart (NOVOLOG) 100 UNIT/ML FlexPen Inject 3 Units into the skin 3 (three) times daily with meals.  . insulin aspart (NOVOLOG) 100 UNIT/ML FlexPen CBG < 70: Implement Hypoglycemia Standing Orders and refer to Hypoglycemia Standing Orders sidebar report  CBG < 120 (dose in units): 0  CBG 120 - 160 (dose in units): 2  CBG 161 - 200 (dose in units): 4  CBG 201 - 250 (dose in units): 8  CBG 251 - 300 (dose in units): 12  CBG 301 - 350 (dose in units) 16  CBG 351 - 450 (dose in units): 20  CBG > 450 24 units and obtain STAT glucose and notify MD  *this will be a short term medication that will be susceptible to changes when your tube feedings are changes  . insulin detemir (LEVEMIR) 100 UNIT/ML injection Inject 0.2 mLs (20 Units total)  into the skin daily.  . Insulin Pen Needle (PEN NEEDLES 3/16") 31G X 5 MM MISC Inject 3 Units as directed 3 (three) times daily.  . metoprolol tartrate (LOPRESSOR) 25 MG tablet Place 1 tablet (25 mg total) into feeding tube 2 (two) times daily. Crush finely and insert in Jtube  . Nutritional Supplements (FEEDING SUPPLEMENT, OSMOLITE 1.5 CAL,) LIQD Place 1,000 mLs into feeding tube continuous for 15 days.  . Nutritional Supplements (FEEDING  SUPPLEMENT, PROSOURCE TF,) liquid Place 45 mLs into feeding tube 3 (three) times daily for 15 days.  Marland Kitchen oxyCODONE (ROXICODONE) 5 MG/5ML solution Place 5 mLs (5 mg total) into feeding tube every 6 (six) hours as needed for up to 7 days for severe pain.  . [DISCONTINUED] pantoprazole sodium (PROTONIX) 40 mg/20 mL PACK 59m./ml, 20 ml via J-Tube daily   No facility-administered encounter medications on file as of 11/30/2019.     Review of Systems  Constitutional: Negative for chills and fever.  HENT: Negative for congestion, rhinorrhea and sore throat.   Respiratory: Negative for cough, shortness of breath and wheezing.   Cardiovascular: Negative for chest pain and leg swelling.  Gastrointestinal: Negative for abdominal pain, diarrhea, nausea and vomiting.       +healing from perforated esophagus repair, J-tube in place.  Genitourinary: Negative for dysuria and frequency.  Skin: Negative for rash.  Neurological: Negative for dizziness, weakness and headaches.     Vitals There were no vitals taken for this visit.  Objective:   Physical Exam Phone visit.  Assessment and Plan   1. Perforated carcinoma of esophagus ([facility]  2. Diabetes mellitus due to underlying condition with hyperglycemia, unspecified whether long term insulin use (HCC)   DM2- uncontrolled, since pt switching from oral pills to insulin. Pt taking levimir and novolog sliding scale. Advising -Increase the levimir from 20 units to 25 units in am. Continue to check bg 3-5xper day.  Continue using novolog sliding scale. If seeing numbers under 90 or over 300 to call the office. Pt healing well from repair of esophageal rupture.   F/u 2 wks with blood glucose readings, DM2. Or call if needing anything additional.   Follow Up Instructions:    I discussed the assessment and treatment plan with the patient. The patient was provided an opportunity to ask questions and all were answered. The patient agreed with the plan and  demonstrated an understanding of the instructions.   The patient was advised to call back or seek an in-person evaluation if the symptoms worsen or if the condition fails to improve as anticipated.  I provided 15 minutes of non-face-to-face time during this encounter.

## 2019-12-02 ENCOUNTER — Telehealth: Payer: Self-pay

## 2019-12-02 ENCOUNTER — Telehealth: Payer: Self-pay | Admitting: Family Medicine

## 2019-12-02 NOTE — Telephone Encounter (Signed)
Steven Crane with [facility] calling to report pt is tired of sticking his finger 4-5 times a day. And is wondering if we could order the freestyle libra for pt to try.   Also reporting she is not aware who is managing his feedings. Right now it is continuous and he is burping up acid and it is yellow like the tube feeding.   CB# [phone]

## 2019-12-02 NOTE — Telephone Encounter (Signed)
Have the nurse  from [place] call the GI doctors to ask if they want the tube feedings 3 or 4x per day instead.   If you all have paperwork for the freestyle libra, I can fill it out for him.  Thx.   Dr. Lovena Le

## 2019-12-02 NOTE — Telephone Encounter (Signed)
Nutrition Follow-up:  Patient called RD and asked for return phone call.    Patient s/p Ivor-lewis esophagectomy with pyloromyotomy and J-tube placement on 6/28.  Following surgery had contained esophageal perforation.    Spoke with patient via phone for nutrition follow-up.  Patient reports reflux, coughing up awful tasting yellow fluid that looks like tube feeding.  Reports gets strangled at times.  Reported started taking protonix via tube yesterday.  Reports that he is taking 24ml of osmolite 1.5 and tries to stay on the pump for 24 hours a day.  Can't increase due to bloating, relux.  Patient is giving prosource TID via J-tube.  Patient flushing tube with 120-170ml of water at least 3 times per day or more. Giving water with prosouce.  Reports frustration with feeding tube and it getting clogged easily.  Blood glucose running in 200s, 4-5 times in 300s in the am if does not take novolog around 1am.  Reports bowel movement 1-2 times per day (soft)  Patient currently NPO. Planning swallow study on 8/18  Medications: novolog, protonix  Labs: reviewed  Anthropometrics:   Weight 222 lb on 8/6   Estimated Energy Needs  Kcals: [id number] Protein: 125-150 g Fluid: > 2 L  NUTRITION DIAGNOSIS: Inadequate oral intake continues relying on feeding tube at this time    INTERVENTION:  Continue 60 ml/hr continuous 24 hr (goal rate 70 ml/hr) at this time.  Encouraged patient to use at least 850ml-1000 ml water per day with flush, mixing medications, etc for hydration as not taking anything po.   Provides 2430 calories, 119 g protein and 1880-2049ml free water.  Will clarify order from [place] especially regarding which prosource patient is being sent.   Offered encouragement to continue nutrition.      MONITORING, EVALUATION, GOAL: weight trends, TF tolerance   NEXT VISIT: August 30th, phone f/u  Steven Crane, Steven Crane, Steven Crane Registered Dietitian [phone] (mobile)

## 2019-12-02 NOTE — Telephone Encounter (Signed)
Please advise. Thank you

## 2019-12-03 ENCOUNTER — Encounter (HOSPITAL_COMMUNITY): Payer: Self-pay

## 2019-12-03 ENCOUNTER — Emergency Department (HOSPITAL_COMMUNITY)
Admission: EM | Admit: 2019-12-03 | Discharge: 2019-12-04 | Disposition: A | Discharge status: Home / Self Care | Payer: BC Managed Care – PPO | Attending: Emergency Medicine | Admitting: Emergency Medicine

## 2019-12-03 ENCOUNTER — Telehealth (HOSPITAL_COMMUNITY): Payer: Self-pay

## 2019-12-03 DIAGNOSIS — K9423 Gastrostomy malfunction: Secondary | ICD-10-CM | POA: Diagnosis present

## 2019-12-03 DIAGNOSIS — J90 Pleural effusion, not elsewhere classified: Secondary | ICD-10-CM | POA: Insufficient documentation

## 2019-12-03 DIAGNOSIS — Z6828 Body mass index (BMI) 28.0-28.9, adult: Secondary | ICD-10-CM | POA: Diagnosis not present

## 2019-12-03 DIAGNOSIS — E785 Hyperlipidemia, unspecified: Secondary | ICD-10-CM | POA: Insufficient documentation

## 2019-12-03 DIAGNOSIS — Z6836 Body mass index (BMI) 36.0-36.9, adult: Secondary | ICD-10-CM | POA: Insufficient documentation

## 2019-12-03 DIAGNOSIS — J45909 Unspecified asthma, uncomplicated: Secondary | ICD-10-CM | POA: Insufficient documentation

## 2019-12-03 DIAGNOSIS — T85528A Displacement of other gastrointestinal prosthetic devices, implants and grafts, initial encounter: Secondary | ICD-10-CM

## 2019-12-03 DIAGNOSIS — R627 Adult failure to thrive: Secondary | ICD-10-CM | POA: Insufficient documentation

## 2019-12-03 DIAGNOSIS — E119 Type 2 diabetes mellitus without complications: Secondary | ICD-10-CM | POA: Insufficient documentation

## 2019-12-03 DIAGNOSIS — Z833 Family history of diabetes mellitus: Secondary | ICD-10-CM | POA: Insufficient documentation

## 2019-12-03 DIAGNOSIS — Z8249 Family history of ischemic heart disease and other diseases of the circulatory system: Secondary | ICD-10-CM | POA: Diagnosis not present

## 2019-12-03 DIAGNOSIS — N200 Calculus of kidney: Secondary | ICD-10-CM | POA: Diagnosis not present

## 2019-12-03 DIAGNOSIS — M1990 Unspecified osteoarthritis, unspecified site: Secondary | ICD-10-CM | POA: Insufficient documentation

## 2019-12-03 DIAGNOSIS — I2510 Atherosclerotic heart disease of native coronary artery without angina pectoris: Secondary | ICD-10-CM | POA: Diagnosis not present

## 2019-12-03 DIAGNOSIS — C160 Malignant neoplasm of cardia: Secondary | ICD-10-CM | POA: Diagnosis not present

## 2019-12-03 DIAGNOSIS — I509 Heart failure, unspecified: Secondary | ICD-10-CM | POA: Insufficient documentation

## 2019-12-03 DIAGNOSIS — Z79899 Other long term (current) drug therapy: Secondary | ICD-10-CM | POA: Diagnosis not present

## 2019-12-03 DIAGNOSIS — Z794 Long term (current) use of insulin: Secondary | ICD-10-CM | POA: Insufficient documentation

## 2019-12-03 DIAGNOSIS — K9413 Enterostomy malfunction: Secondary | ICD-10-CM

## 2019-12-03 DIAGNOSIS — K219 Gastro-esophageal reflux disease without esophagitis: Secondary | ICD-10-CM | POA: Diagnosis not present

## 2019-12-03 DIAGNOSIS — I110 Hypertensive heart disease with heart failure: Secondary | ICD-10-CM | POA: Insufficient documentation

## 2019-12-03 DIAGNOSIS — J449 Chronic obstructive pulmonary disease, unspecified: Secondary | ICD-10-CM | POA: Diagnosis not present

## 2019-12-03 DIAGNOSIS — Z955 Presence of coronary angioplasty implant and graft: Secondary | ICD-10-CM | POA: Diagnosis not present

## 2019-12-03 DIAGNOSIS — I252 Old myocardial infarction: Secondary | ICD-10-CM | POA: Diagnosis not present

## 2019-12-03 DIAGNOSIS — Z86718 Personal history of other venous thrombosis and embolism: Secondary | ICD-10-CM | POA: Diagnosis not present

## 2019-12-03 NOTE — Telephone Encounter (Signed)
Nutrition  Spoke to [place] and confirmed tube feeding orders of osmolite 1.5 at 17ml/hr over 24 hours and prosource (100 calorie, 10g protein) 1.5 oz TID.    Spoke to patient again and this am and he has plenty of tube feeding supplies.  Continues protonix.  Not taking reglan.    Continues with osmolite 1.5 at 70ml/hr over 24 hours and prosource 1.5 oz TID via J-tube.   Tube feeding providing 2580 calories, 134 gm protein and 1880-2053ml free water.   Encouraged patient to call surgeon's office to inquire about restarting reglan due to bloating, full feeling. Noted was on this in the hospital.   Contact information provided to patient.   Francheska Villeda B. Zenia Resides, [place], [place] Registered Dietitian [phone] (mobile)

## 2019-12-03 NOTE — Telephone Encounter (Signed)
Attempted to contact nurse for [facility]. Voicemail full unable to leave message

## 2019-12-03 NOTE — ED Triage Notes (Signed)
Pt arrives POV for eval of feeding tube being clogged. HHRN and pt have tried all home remedies w/ no relief. States stopped since about 1500 today

## 2019-12-04 ENCOUNTER — Emergency Department (HOSPITAL_COMMUNITY): Payer: BC Managed Care – PPO

## 2019-12-04 HISTORY — PX: IR REPLC DUODEN/JEJUNO TUBE PERCUT W/FLUORO: IMG2334

## 2019-12-04 LAB — CBC WITH DIFFERENTIAL/PLATELET
Abs Immature Granulocytes: 0.03 10*3/uL (ref 0.00–0.07)
Basophils Absolute: 0 10*3/uL (ref 0.0–0.1)
Basophils Relative: 1 %
Eosinophils Absolute: 0 10*3/uL (ref 0.0–0.5)
Eosinophils Relative: 0 %
HCT: 37.1 % — ABNORMAL LOW (ref 39.0–52.0)
Hemoglobin: 12 g/dL — ABNORMAL LOW (ref 13.0–17.0)
Immature Granulocytes: 0 %
Lymphocytes Relative: 10 %
Lymphs Abs: 0.8 10*3/uL (ref 0.7–4.0)
MCH: 29.6 pg (ref 26.0–34.0)
MCHC: 32.3 g/dL (ref 30.0–36.0)
MCV: 91.4 fL (ref 80.0–100.0)
Monocytes Absolute: 0.9 10*3/uL (ref 0.1–1.0)
Monocytes Relative: 12 %
Neutro Abs: 6 10*3/uL (ref 1.7–7.7)
Neutrophils Relative %: 77 %
Platelets: 288 10*3/uL (ref 150–400)
RBC: 4.06 MIL/uL — ABNORMAL LOW (ref 4.22–5.81)
RDW: 13.7 % (ref 11.5–15.5)
WBC: 7.8 10*3/uL (ref 4.0–10.5)
nRBC: 0 % (ref 0.0–0.2)

## 2019-12-04 LAB — COMPREHENSIVE METABOLIC PANEL
ALT: 55 U/L — ABNORMAL HIGH (ref 0–44)
AST: 21 U/L (ref 15–41)
Albumin: 2.6 g/dL — ABNORMAL LOW (ref 3.5–5.0)
Alkaline Phosphatase: 101 U/L (ref 38–126)
Anion gap: 10 (ref 5–15)
BUN: 18 mg/dL (ref 6–20)
CO2: 27 mmol/L (ref 22–32)
Calcium: 9.1 mg/dL (ref 8.9–10.3)
Chloride: 98 mmol/L (ref 98–111)
Creatinine, Ser: 0.63 mg/dL (ref 0.61–1.24)
GFR calc Af Amer: >60 mL/min (ref >60)
GFR calc non Af Amer: >60 mL/min (ref >60)
Glucose, Bld: 149 mg/dL — ABNORMAL HIGH (ref 70–99)
Potassium: 4.5 mmol/L (ref 3.5–5.1)
Sodium: 135 mmol/L (ref 135–145)
Total Bilirubin: 0.5 mg/dL (ref 0.3–1.2)
Total Protein: 6.8 g/dL (ref 6.5–8.1)

## 2019-12-04 LAB — CBG MONITORING, ED: Glucose-Capillary: 155 mg/dL — ABNORMAL HIGH (ref 70–99)

## 2019-12-04 LAB — LIPASE, BLOOD: Lipase: 17 U/L (ref 11–51)

## 2019-12-04 MED ORDER — IOHEXOL 300 MG/ML  SOLN
50.0000 mL | Freq: Once | INTRAMUSCULAR | Status: AC | PRN
  Administered 2019-12-04: 15 mL

## 2019-12-04 MED ORDER — LIDOCAINE VISCOUS HCL 2 % MT SOLN
OROMUCOSAL | Status: AC
  Filled 2019-12-04: qty 15

## 2019-12-04 NOTE — ED Notes (Signed)
Attempted to flush J tube with warm water  Unable to flush.

## 2019-12-04 NOTE — Procedures (Signed)
Interventional Radiology Procedure Note  Procedure: Jejunostomy tube exchange  Complications: None  Estimated Blood Loss: None  Findings: Occluded 16 Fr jejunostomy tube. After removal, reason for kinking/occlusion is acute kinking and collapse at site of additional sidehole creation in tube, creating area of relative tube weakness.  New 16 Fr balloon retention jejunostomy tube placed over wire. Tube cut to appropriate length. Balloon inflated with 5 mL of saline. Tube in good position in jejunum and ok to use.  Venetia Night. Kathlene Cote, M.D Pager:  [phone]

## 2019-12-04 NOTE — ED Notes (Signed)
Transported to CT 

## 2019-12-04 NOTE — Discharge Instructions (Signed)
Follow-up with Dr. Kipp Brood, as planned.

## 2019-12-04 NOTE — ED Provider Notes (Signed)
[facility] EMERGENCY DEPARTMENT Provider Note   CSN: [id number] Arrival date & time: 12/03/19  2034     History Chief Complaint  Patient presents with  . Feeding Tube Clogged    Steven Crane is a 59 y.o. male.  HPI      Steven Crane is a 59 y.o. male, with a history of CHF, CAD, DVT, GERD, DM, MI, HTN, adenocarcinoma of the gastroesophageal junction, presenting to the ED with complaint of an obstructed J-tube since around 3 PM yesterday. Both he and the home health nurse have tried to flush the tube without success. The tube has been difficult to flush and feed for the last couple days.  He has also begun to have abdominal discomfort and emesis, sometimes green in color and sometimes similar in color and consistency as the feeding product.  Patient states the J-tube was placed June 28 in conjunction with esophagectomy, placed by cardiothoracic surgeon Dr Kipp Brood.  He is in a n.p.o. status.  Denies chest pain, shortness of breath, fever, changes in bowel movements, hematochezia/melena, or any other complaints.  Past Medical History:  Diagnosis Date  . Anxiety   . Arthritis    "hands, back, knees" (08/27/2013)  . Asthma   . CHF (congestive heart failure) ([place]) 02/2010  . Chronic bronchitis ([place])    "get it q year"  . Chronic lower back pain   . Closed head injury 1975  . Coronary artery disease May 2015   s/p successful PTCA/DES x 1 to distal RCA and PTCA/DES x 1 to OM-20 Aug 2013  . Depression   . DVT (deep venous thrombosis) ([place]) 2008   "LLE"  . Esophageal cancer ([place])   . GERD (gastroesophageal reflux disease)   . Headache    hs of but no longer has problems   . History of kidney stones   . Hyperlipemia   . Hypertension   . Hypertriglyceridemia   . LV dysfunction    LVEF 40% at time of cardiac cath May 2015  . Morbid obesity ([place])   . Myocardial infarction ([place]) 02/2010  . Psoriasis   . Type II diabetes mellitus [facility])     Patient Active  Problem List   Diagnosis Date Noted  . Pressure injury of skin 11/16/2019  . Esophageal perforation 11/14/2019  . 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 Protein-calorie malnutrition, severe 10/20/2019  . Esophageal cancer ([place]) 10/18/2019  . Failure to thrive (0-17) 09/21/2019  . Neutropenia, drug-induced ([place]) 08/23/2019  . 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 GERD (gastroesophageal reflux disease)   . Esophageal dysphagia   . Displaced oblique fracture of shaft of left tibia, initial encounter for closed fracture 08/21/2018  . Displaced comminuted fracture of shaft of left tibia, initial encounter for closed fracture 08/21/2018  . Unstable angina ([place]) 08/27/2013  . CAD S/P percutaneous coronary angioplasty 2011 08/06/2013  . Chest pain 08/06/2013  . Kidney stone 04/05/2013  . Reactive airways dysfunction syndrome ([place]) 11/30/2012  . Hyperlipemia 09/25/2012  . Diabetes mellitus without complication ([place]) 67/20/9470  . Atherosclerotic heart disease 09/25/2012  . Essential hypertension, benign 09/25/2012  . Morbid obesity ([place]) 09/25/2012  . Psoriasis 09/25/2012  . History of DVT (deep vein thrombosis) 09/25/2012    Past Surgical History:  Procedure Laterality Date  . BIOPSY  06/17/2019   Procedure: BIOPSY;  Surgeon: [name], MD;  Location[facility];  Service: Endoscopy;;  .  COLONOSCOPY  01/06/2012   Dr. [name]: suboptimal prep. normal exam. next colonoscopy in 10 years.   . CORONARY ANGIOPLASTY WITH STENT PLACEMENT  02/2010; 08/27/2013   "1 + 3"   . ESOPHAGOGASTRODUODENOSCOPY N/A 10/18/2019   Procedure: ESOPHAGOGASTRODUODENOSCOPY (EGD);  Surgeon: Lajuana Matte, MD;  Location: [facility] OR;  Service: Thoracic;  Laterality: N/A;  . ESOPHAGOGASTRODUODENOSCOPY (EGD) WITH PROPOFOL N/A 06/17/2019   Procedure: ESOPHAGOGASTRODUODENOSCOPY (EGD) WITH PROPOFOL;  Surgeon: [name], MD;   Location[facility];  Service: Endoscopy;  Laterality: N/A;  11:15am  . INTERCOSTAL NERVE BLOCK  10/18/2019   Procedure: INTERCOSTAL NERVE BLOCK;  Surgeon: Lajuana Matte, MD;  Location: [place];  Service: Thoracic;;  . IR CM INJ ANY COLONIC TUBE W/FLUORO  11/17/2019  . IR IMAGING GUIDED PORT INSERTION  08/03/2019  . IR REPLC DUODEN/JEJUNO TUBE PERCUT W/FLUORO  12/04/2019  . KNEE ARTHROSCOPY Left 1981  . LEFT HEART CATHETERIZATION WITH CORONARY ANGIOGRAM N/A 08/27/2013   Procedure: LEFT HEART CATHETERIZATION WITH CORONARY ANGIOGRAM;  Surgeon: [name], MD;  Location: [facility] CATH LAB;  Service: Cardiovascular;  Laterality: N/A;  . NODE DISSECTION  10/18/2019   Procedure: NODE DISSECTION;  Surgeon: Lajuana Matte, MD;  Location: [place];  Service: Thoracic;;  . right wrist fracture surgery     . TIBIA IM NAIL INSERTION Left 08/21/2018   Procedure: INTRAMEDULLARY (IM) NAIL TIBIAL;  Surgeon: Rod Can, MD;  Location: [facility] ORS;  Service: Orthopedics;  Laterality: Left;       Family History  Problem Relation Age of Onset  . Coronary artery disease Father   . Diabetes type II Father   . Diabetes Father   . Heart attack Father   . Hypertension Mother     Social History   Tobacco Use  . Smoking status: Never Smoker  . Smokeless tobacco: Never Used  . Tobacco comment: "started dipping @ age 38; quit for 5 1/2 yrs at one time; hadn't chewed in awhile"  Vaping Use  . Vaping Use: Never used  Substance Use Topics  . Alcohol use: Not Currently    Alcohol/week: 0.0 standard drinks    Comment: occ   . Drug use: No    Home Medications Prior to Admission medications   Medication Sig Start Date End Date Taking? Authorizing Provider  acetaminophen (TYLENOL) 160 MG/5ML solution Place 10.2-20.3 mLs (325-650 mg total) into feeding tube every 4 (four) hours as needed for mild pain or moderate pain. 11/23/19   Elgie Collard, PA-C  blood glucose meter kit and supplies Test glucose  once daily. ICD 10 E11.9 09/26/17   [name], MD  insulin aspart (NOVOLOG) 100 UNIT/ML FlexPen Inject 3 Units into the skin 3 (three) times daily with meals. 11/23/19   Elgie Collard, PA-C  insulin aspart (NOVOLOG) 100 UNIT/ML FlexPen CBG < 70: Implement Hypoglycemia Standing Orders and refer to Hypoglycemia Standing Orders sidebar report  CBG < 120 (dose in units): 0  CBG 120 - 160 (dose in units): 2  CBG 161 - 200 (dose in units): 4  CBG 201 - 250 (dose in units): 8  CBG 251 - 300 (dose in units): 12  CBG 301 - 350 (dose in units) 16  CBG 351 - 450 (dose in units): 20  CBG > 450 24 units and obtain STAT glucose and notify MD  *this will be a short term medication that will be susceptible to changes when your tube feedings are changes 11/23/19  Harriet Pho, Tessa N, PA-C  insulin detemir (LEVEMIR) 100 UNIT/ML injection Inject 0.2 mLs (20 Units total) into the skin daily. 11/24/19   Elgie Collard, PA-C  Insulin Pen Needle (PEN NEEDLES 3/16") 31G X 5 MM MISC Inject 3 Units as directed 3 (three) times daily. 11/23/19   Elgie Collard, PA-C  metoprolol tartrate (LOPRESSOR) 25 MG tablet Place 1 tablet (25 mg total) into feeding tube 2 (two) times daily. Crush finely and insert in Jtube 11/23/19   Elgie Collard, PA-C  Nutritional Supplements (FEEDING SUPPLEMENT, OSMOLITE 1.5 CAL,) LIQD Place 1,000 mLs into feeding tube continuous for 15 days. 11/23/19 12/08/19  Elgie Collard, PA-C  Nutritional Supplements (FEEDING SUPPLEMENT, PROSOURCE TF,) liquid Place 45 mLs into feeding tube 3 (three) times daily for 15 days. 11/23/19 12/08/19  Elgie Collard, PA-C  pantoprazole (PROTONIX) 40 MG tablet SMARTSIG:2 Tablet(s) By Mouth 10 Times Daily PRN 12/01/19   [provider]  pantoprazole sodium (PROTONIX) 40 mg/20 mL PACK 19m./ml, 20 ml via J-Tube daily 11/30/19   LLajuana Matte MD    Allergies    Adhesive [tape]  Review of Systems   Review of Systems  Constitutional: Negative for chills,  diaphoresis and fever.  Respiratory: Negative for cough and shortness of breath.   Cardiovascular: Negative for chest pain.  Gastrointestinal: Positive for abdominal pain, nausea and vomiting. Negative for blood in stool.       Clogged J-tube.  All other systems reviewed and are negative.   Physical Exam Updated Vital Signs BP 139/85 (BP Location: Right Arm)   Pulse 82   Temp 98.1 F (36.7 C) (Oral)   Resp 16   Ht '6\' 2"'  (1.88 m)   Wt 100.7 kg   SpO2 97%   BMI 28.50 kg/m   Physical Exam Vitals and nursing note reviewed.  Constitutional:      General: He is not in acute distress.    Appearance: He is well-developed. He is not diaphoretic.  HENT:     Head: Normocephalic and atraumatic.     Mouth/Throat:     Mouth: Mucous membranes are moist.     Pharynx: Oropharynx is clear.  Eyes:     Conjunctiva/sclera: Conjunctivae normal.  Cardiovascular:     Rate and Rhythm: Normal rate and regular rhythm.     Pulses: Normal pulses.          Radial pulses are 2+ on the right side and 2+ on the left side.       Posterior tibial pulses are 2+ on the right side and 2+ on the left side.     Heart sounds: Normal heart sounds.     Comments: Tactile temperature in the extremities appropriate and equal bilaterally. Pulmonary:     Effort: Pulmonary effort is normal. No respiratory distress.     Breath sounds: Normal breath sounds.  Abdominal:     Palpations: Abdomen is soft.     Tenderness: There is abdominal tenderness. There is no guarding.       Comments: J-tube in place.  There is some drainage leaking from around the J-tube.  J-tube is sutured in place. There is what appears to be feeding product in the tube.  Musculoskeletal:     Cervical back: Neck supple.     Right lower leg: No edema.     Left lower leg: No edema.  Lymphadenopathy:     Cervical: No cervical adenopathy.  Skin:    General: Skin is warm and  dry.  Neurological:     Mental Status: He is alert.  Psychiatric:         Mood and Affect: Mood and affect normal.        Speech: Speech normal.        Behavior: Behavior normal.     ED Results / Procedures / Treatments   Labs (all labs ordered are listed, but only abnormal results are displayed) Labs Reviewed  COMPREHENSIVE METABOLIC PANEL - Abnormal; Notable for the following components:      Result Value   Glucose, Bld 149 (*)    Albumin 2.6 (*)    ALT 55 (*)    All other components within normal limits  CBC WITH DIFFERENTIAL/PLATELET - Abnormal; Notable for the following components:   RBC 4.06 (*)    Hemoglobin 12.0 (*)    HCT 37.1 (*)    All other components within normal limits  CBG MONITORING, ED - Abnormal; Notable for the following components:   Glucose-Capillary 155 (*)    All other components within normal limits  LIPASE, BLOOD    Hemoglobin  Date Value Ref Range Status  12/04/2019 12.0 (L) 13.0 - 17.0 g/dL Final  11/23/2019 9.8 (L) 13.0 - 17.0 g/dL Final  11/17/2019 9.4 (L) 13.0 - 17.0 g/dL Final    EKG None  Radiology  CT ABDOMEN WO CONTRAST  Result Date: 12/04/2019 CLINICAL DATA:  Epigastric pain and difficulty using jejunostomy catheter EXAM: CT ABDOMEN WITHOUT CONTRAST TECHNIQUE: Multidetector CT imaging of the abdomen was performed following the standard protocol without IV contrast. COMPARISON:  11/13/2019 FINDINGS: Lower chest: Right-sided pleural effusion is noted stable from the prior exam. Some associated right basilar atelectasis is noted. Hepatobiliary: No focal liver abnormality is seen. No gallstones, gallbladder wall thickening, or biliary dilatation. Pancreas: Unremarkable. No pancreatic ductal dilatation or surrounding inflammatory changes. Spleen: Normal in size without focal abnormality. Adrenals/Urinary Tract: Adrenal glands are within normal limits. Kidneys demonstrate bilateral nonobstructing renal calculi similar to that noted on prior exam. Ureters are within normal limits. The bladder is not visualized on  this study. Stomach/Bowel: Visualized bowel demonstrates evidence of prior esophagectomy and gastric pull-through. Small bowel and colon appear within normal limits. The appendix is not well visualized. No inflammatory changes to suggest appendicitis are noted. Jejunostomy catheter is noted and appears coiled upon itself with some kinking. Given the clinical history, this likely contributes to the catheter malfunction. Vascular/Lymphatic: No significant vascular findings are present. No enlarged abdominal or pelvic lymph nodes. Other: No abdominal wall hernia or abnormality. Musculoskeletal: Degenerative changes are noted. IMPRESSION: Right-sided pleural effusion stable from prior exam. Associated atelectasis is noted. Nonobstructing bilateral renal calculi stable from the previous study. Kinking of the known jejunostomy catheter. This likely contributes to the poor function of the catheter. No other focal abnormality is noted. Electronically Signed   By: Inez Catalina M.D.   On: 12/04/2019 09:50   IR Replc Duoden/Jejuno Tube Percut W/Fluoro  Result Date: 12/04/2019 INDICATION: History of esophagectomy and jejunostomy tube placement on 10/18/2019. Recurrent occlusion and kinking of indwelling jejunostomy tube. The tube is now completely occluded and nonfunctional. Flushing attempts as well as wiring and de-clog attempts have been unsuccessful. EXAM: REPLACEMENT OF JEJUNOSTOMY TUBE UNDER FLUOROSCOPY MEDICATIONS: None ANESTHESIA/SEDATION: None CONTRAST:  15 mL Omnipaque 300-administered into the gastric lumen. FLUOROSCOPY TIME:  Fluoroscopy Time: 46 seconds.  6.0 mGy. COMPLICATIONS: None immediate. PROCEDURE: Informed written consent was obtained from the patient after a thorough discussion of the procedural risks, benefits  and alternatives. All questions were addressed. Maximal Sterile Barrier Technique was utilized including caps, mask, sterile gowns, sterile gloves, sterile drape, hand hygiene and skin  antiseptic. A timeout was performed prior to the initiation of the procedure. Fluoroscopy was performed of the pre-existing jejunostomy tube. Prior to prepping the catheter, attempt was made to flush the catheter, advance a guidewire and also advanced a dedicated de-clogging brush device. The pre-existing tube was retracted under fluoroscopy, eventually cut and removed over a guidewire. A new 70 French balloon retention catheter was then trimmed and advanced over the wire. The retention balloon was inflated with 5 mL of sterile saline. The new jejunostomy catheter was then injected with contrast material and positioning confirmed by fluoroscopy. FINDINGS: The pre-existing jejunostomy tube was completely occluded and could not be successfully de-clogged with flushing, guidewire and de-clogging brush advancement maneuvers. After catheter removal over a guidewire, there was a appearing kink involving the distal portion of the catheter at the level of what appears to be placement of a side hole in the distal portion of the catheter. A new 26 French balloon retention catheter was advanced over the wire into the jejunum and is normally patent with the tip located in the jejunum. Additional sideholes were not added to this catheter and this is a single lumen end-hole jejunostomy. The catheter may be used immediately. IMPRESSION: Replacement of occluded 16 French jejunostomy tube for new 16 French balloon retention jejunostomy. The new catheter tip lies in the jejunum and is ready for immediate use. The occluded catheter was kinked as suspected by CT and fluoroscopy at a level of side hole creation where there was some relative weakening of the integrity of the catheter. Electronically Signed   By: Aletta Edouard M.D.   On: 12/04/2019 14:52    Procedures Procedures (including critical care time)  Medications Ordered in ED Medications  iohexol (OMNIPAQUE) 300 MG/ML solution 50 mL (15 mLs Per Tube Contrast Given  12/04/19 1419)    ED Course  I have reviewed the triage vital signs and the nursing notes.  Pertinent labs & imaging results that were available during my care of the patient were reviewed by me and considered in my medical decision making (see chart for details).  Clinical Course as of Dec 07 1315  Sat Dec 04, 2019  1102 Spoke with Dr. Kipp Brood, patient's cardiothoracic surgeon who placed the J-tube. Recommends we speak with IR.  They have been able to resolve prior kinks in the patient's J-tube. He plans to see the patient in the office next week.   [SJ]  1137 Spoke with Dr. Kathlene Cote, [place]. States for [place] to try to force flush the tube.  If this does not work, put in order for IR J-tube exchange.   [SJ]    Clinical Course User Index [SJ] Hena Ewalt, Helane Gunther, PA-C   MDM Rules/Calculators/A&P                          Patient presents with a clogged J-tube.  We were unable to correct this in the ED. IR replaced patient's J-tube and showed it to be functioning afterward. Patient has follow-up appointment scheduled with his surgeon.  The patient was given instructions for home care as well as return precautions. Patient voices understanding of these instructions, accepts the plan, and is comfortable with discharge.  I reviewed and interpreted the patient's labs and radiological studies.   Findings and plan of care discussed with [facility]  [place], MD. Dr. Stark Jock personally evaluated and examined this patient.  Final Clinical Impression(s) / ED Diagnoses Final diagnoses:  Jejunostomy tube fell out  Malfunction of jejunostomy tube [facility])    Rx / DC Orders ED Discharge Orders    None       Layla Maw 12/07/19 1319    Veryl Speak, MD 12/10/19 2334

## 2019-12-04 NOTE — ED Notes (Signed)
Patient transported to CT 

## 2019-12-06 ENCOUNTER — Other Ambulatory Visit: Payer: Self-pay

## 2019-12-06 ENCOUNTER — Telehealth: Payer: Self-pay

## 2019-12-06 ENCOUNTER — Inpatient Hospital Stay (HOSPITAL_COMMUNITY): Payer: BC Managed Care – PPO

## 2019-12-06 ENCOUNTER — Other Ambulatory Visit: Payer: Self-pay | Admitting: *Deleted

## 2019-12-06 ENCOUNTER — Inpatient Hospital Stay (HOSPITAL_COMMUNITY): Payer: BC Managed Care – PPO | Attending: Hematology | Admitting: Hematology

## 2019-12-06 ENCOUNTER — Encounter (HOSPITAL_COMMUNITY): Payer: Self-pay

## 2019-12-06 VITALS — BP 134/73 | HR 67 | Temp 98.3°F | Resp 16 | Wt 214.3 lb

## 2019-12-06 DIAGNOSIS — R1310 Dysphagia, unspecified: Secondary | ICD-10-CM | POA: Diagnosis not present

## 2019-12-06 DIAGNOSIS — E119 Type 2 diabetes mellitus without complications: Secondary | ICD-10-CM | POA: Insufficient documentation

## 2019-12-06 DIAGNOSIS — R634 Abnormal weight loss: Secondary | ICD-10-CM | POA: Insufficient documentation

## 2019-12-06 DIAGNOSIS — Z452 Encounter for adjustment and management of vascular access device: Secondary | ICD-10-CM | POA: Diagnosis not present

## 2019-12-06 DIAGNOSIS — C160 Malignant neoplasm of cardia: Secondary | ICD-10-CM | POA: Insufficient documentation

## 2019-12-06 LAB — COMPREHENSIVE METABOLIC PANEL
ALT: 48 U/L — ABNORMAL HIGH (ref 0–44)
AST: 26 U/L (ref 15–41)
Albumin: 2.9 g/dL — ABNORMAL LOW (ref 3.5–5.0)
Alkaline Phosphatase: 103 U/L (ref 38–126)
Anion gap: 9 (ref 5–15)
BUN: 22 mg/dL — ABNORMAL HIGH (ref 6–20)
CO2: 28 mmol/L (ref 22–32)
Calcium: 8.6 mg/dL — ABNORMAL LOW (ref 8.9–10.3)
Chloride: 99 mmol/L (ref 98–111)
Creatinine, Ser: 0.54 mg/dL — ABNORMAL LOW (ref 0.61–1.24)
GFR calc Af Amer: >60 mL/min (ref >60)
GFR calc non Af Amer: >60 mL/min (ref >60)
Glucose, Bld: 150 mg/dL — ABNORMAL HIGH (ref 70–99)
Potassium: 3.7 mmol/L (ref 3.5–5.1)
Sodium: 136 mmol/L (ref 135–145)
Total Bilirubin: 0.4 mg/dL (ref 0.3–1.2)
Total Protein: 7.2 g/dL (ref 6.5–8.1)

## 2019-12-06 LAB — CBC WITH DIFFERENTIAL/PLATELET
Abs Immature Granulocytes: 0.04 10*3/uL (ref 0.00–0.07)
Basophils Absolute: 0 10*3/uL (ref 0.0–0.1)
Basophils Relative: 0 %
Eosinophils Absolute: 0.1 10*3/uL (ref 0.0–0.5)
Eosinophils Relative: 1 %
HCT: 40.1 % (ref 39.0–52.0)
Hemoglobin: 12.7 g/dL — ABNORMAL LOW (ref 13.0–17.0)
Immature Granulocytes: 0 %
Lymphocytes Relative: 12 %
Lymphs Abs: 1.3 10*3/uL (ref 0.7–4.0)
MCH: 29.2 pg (ref 26.0–34.0)
MCHC: 31.7 g/dL (ref 30.0–36.0)
MCV: 92.2 fL (ref 80.0–100.0)
Monocytes Absolute: 1.1 10*3/uL — ABNORMAL HIGH (ref 0.1–1.0)
Monocytes Relative: 10 %
Neutro Abs: 8 10*3/uL — ABNORMAL HIGH (ref 1.7–7.7)
Neutrophils Relative %: 77 %
Platelets: 339 10*3/uL (ref 150–400)
RBC: 4.35 MIL/uL (ref 4.22–5.81)
RDW: 13.4 % (ref 11.5–15.5)
WBC: 10.4 10*3/uL (ref 4.0–10.5)
nRBC: 0 % (ref 0.0–0.2)

## 2019-12-06 MED ORDER — SODIUM CHLORIDE 0.9% FLUSH
10.0000 mL | Freq: Once | INTRAVENOUS | Status: AC
  Administered 2019-12-06: 10 mL via INTRAVENOUS

## 2019-12-06 MED ORDER — HEPARIN SOD (PORK) LOCK FLUSH 100 UNIT/ML IV SOLN
500.0000 [IU] | Freq: Once | INTRAVENOUS | Status: AC
  Administered 2019-12-06: 500 [IU] via INTRAVENOUS

## 2019-12-06 NOTE — Telephone Encounter (Signed)
Spoke with patient and he is complaining of his the tube site draining and has an odor.  Message sent to patients provider for further advise.

## 2019-12-06 NOTE — Progress Notes (Signed)
Orders placed for physical therapy and occupational therapy for strengthening exercises.

## 2019-12-06 NOTE — Progress Notes (Signed)
Patients port flushed without difficulty.  Good blood return noted with no bruising or swelling noted at site.  Band aid applied.  VSS with discharge and left in satisfactory condition with no s/s of distress noted.   

## 2019-12-06 NOTE — Progress Notes (Signed)
Patient accepted for PT/OT with [place] per Judson Roch, RN with [facility].

## 2019-12-06 NOTE — Progress Notes (Signed)
Steven Crane, [place] [id number]   CLINIC:  Medical Oncology/Hematology  PCP:  Erven Colla, DO [address] / [place] [address] [phone]   REASON FOR VISIT:  Follow-up for GE junction adenocarcinoma  PRIOR THERAPY: Weekly carboplatin and paclitaxel from 07/26/2019 to 08/30/2019.  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)   07/26/2019 -  Chemotherapy   The patient had palonosetron (ALOXI) injection 0.25 mg, 0.25 mg, Intravenous,  Once, 1 of 1 cycle Administration: 0.25 mg (07/26/2019), 0.25 mg (08/02/2019), 0.25 mg (08/09/2019), 0.25 mg (08/17/2019), 0.25 mg (08/23/2019), 0.25 mg (08/30/2019) CARBOplatin (PARAPLATIN) 300 mg in sodium chloride 0.9 % 250 mL chemo infusion, 300 mg (100 % of original dose 300 mg), Intravenous,  Once, 1 of 1 cycle Dose modification:   (original dose 300 mg, Cycle 1),   (original dose 300 mg, Cycle 1),   (original dose 300 mg, Cycle 1),   (original dose 300 mg, Cycle 1),   (original dose 300 mg, Cycle 1),   (original dose 300 mg, Cycle 1) Administration: 300 mg (07/26/2019), 300 mg (08/02/2019), 300 mg (08/09/2019), 300 mg (08/17/2019), 300 mg (08/23/2019), 300 mg (08/30/2019) PACLitaxel (TAXOL) 132 mg in sodium chloride 0.9 % 250 mL chemo infusion (</= 55m/m2), 50 mg/m2 = 132 mg, Intravenous,  Once, 1 of 1 cycle Administration: 132 mg (07/26/2019), 132 mg (08/02/2019), 132 mg (08/09/2019), 132 mg (08/17/2019), 132 mg (08/23/2019), 132 mg (08/30/2019)  for chemotherapy treatment.      CANCER STAGING: Cancer Staging No matching staging information was found for the patient.  INTERVAL HISTORY:  Mr. Steven Crane a 59y.o. male, returns for routine follow-up of his GE junction adenocarcinoma. BJirehwas last seen on 10/04/2019.    Today he is accompanied by his wife. He reports that he gets biliary drainage from around his J-tube and is only taking water and ice chips by mouth. He is getting 60 mL of tube feeds around the clock. The right sided abdominal pain is still ongoing and is alleviated when he reclines. He will have a swallow test on 8/18 and go back to Dr. LKipp Broodon 8/20. He reports that his energy levels are severely low and getting worse.   REVIEW OF SYSTEMS:  Review of Systems  Constitutional: Positive for appetite change (depleted) and fatigue (depleted).  Respiratory: Positive for cough.   Cardiovascular: Positive for palpitations.  Gastrointestinal: Positive for abdominal pain (7/10 abdominal pain), nausea and vomiting (yellow-green vomitus).  Neurological: Positive for dizziness, headaches and numbness.  All other systems reviewed and are negative.   PAST MEDICAL/SURGICAL HISTORY:  Past Medical History:  Diagnosis Date  . Anxiety   . Arthritis    "hands, back, knees" (08/27/2013)  . Asthma   . CHF (congestive heart failure) ([facility] 02/2010  . Chronic bronchitis ([facility]    "get it q year"  . Chronic lower back pain   . Closed head injury 1975  . Coronary artery disease May 2015   s/p successful PTCA/DES x 1 to distal RCA and PTCA/DES x 1 to OM-20 Aug 2013  . Depression   . DVT (deep venous thrombosis) ([facility] 2008   "LLE"  . Esophageal cancer ([facility]   . GERD (gastroesophageal reflux disease)   . Headache    hs of but no longer has problems   .  History of kidney stones   . Hyperlipemia   . Hypertension   . Hypertriglyceridemia   . LV dysfunction    LVEF 40% at time of cardiac cath May 2015  . Morbid obesity ([facility])   . Myocardial infarction ([facility]) 02/2010  . Psoriasis   . Type II diabetes mellitus ([facility])    Past Surgical History:  Procedure Laterality Date  . BIOPSY  06/17/2019   Procedure: BIOPSY;  Surgeon: [name], MD;  [facility]: [facility];  Service: Endoscopy;;  .  COLONOSCOPY  01/06/2012   Dr. [name]: suboptimal prep. normal exam. next colonoscopy in 10 years.   . CORONARY ANGIOPLASTY WITH STENT PLACEMENT  02/2010; 08/27/2013   "1 + 3"   . ESOPHAGOGASTRODUODENOSCOPY N/A 10/18/2019   Procedure: ESOPHAGOGASTRODUODENOSCOPY (EGD);  Surgeon: Lajuana Matte, MD;  Location: [facility] OR;  Service: Thoracic;  Laterality: N/A;  . ESOPHAGOGASTRODUODENOSCOPY (EGD) WITH PROPOFOL N/A 06/17/2019   Procedure: ESOPHAGOGASTRODUODENOSCOPY (EGD) WITH PROPOFOL;  Surgeon: [name], MD;  [facility];  Service: Endoscopy;  Laterality: N/A;  11:15am  . INTERCOSTAL NERVE BLOCK  10/18/2019   Procedure: INTERCOSTAL NERVE BLOCK;  Surgeon: Lajuana Matte, MD;  Location: [facility];  Service: Thoracic;;  . IR CM INJ ANY COLONIC TUBE W/FLUORO  11/17/2019  . IR IMAGING GUIDED PORT INSERTION  08/03/2019  . IR REPLC DUODEN/JEJUNO TUBE PERCUT W/FLUORO  12/04/2019  . KNEE ARTHROSCOPY Left 1981  . LEFT HEART CATHETERIZATION WITH CORONARY ANGIOGRAM N/A 08/27/2013   Procedure: LEFT HEART CATHETERIZATION WITH CORONARY ANGIOGRAM;  Surgeon: [name], MD;  Location: [facility] CATH LAB;  Service: Cardiovascular;  Laterality: N/A;  . NODE DISSECTION  10/18/2019   Procedure: NODE DISSECTION;  Surgeon: Lajuana Matte, MD;  Location: [facility];  Service: Thoracic;;  . right wrist fracture surgery     . TIBIA IM NAIL INSERTION Left 08/21/2018   Procedure: INTRAMEDULLARY (IM) NAIL TIBIAL;  Surgeon: Rod Can, MD;  Location: [facility] OR[facility];  Service: Orthopedics;  Laterality: Left;    SOCIAL HISTORY:  Social History   Socioeconomic History  . Marital status: Single    Spouse name: Not on file  . Number of children: 3  . Years of education: Not on file  . Highest education level: Not on file  Occupational History  . Occupation: [employer]  Tobacco Use  . Smoking status: Never Smoker  . Smokeless tobacco: Never Used  . Tobacco comment: "started dipping @ age 69; quit for 5 1/2 yrs at one  time; hadn't chewed in awhile"  Vaping Use  . Vaping Use: Never used  Substance and Sexual Activity  . Alcohol use: Not Currently    Alcohol/week: 0.0 standard drinks    Comment: occ   . Drug use: No  . Sexual activity: Yes  Other Topics Concern  . Not on file  Social History Narrative  . Not on file   Social Determinants of Health   Financial Resource Strain: Low Risk   . Difficulty of Paying Living Expenses: Not hard at all  Food Insecurity: No Food Insecurity  . Worried About Charity fundraiser in the Last Year: Never true  . Ran Out of Food in the Last Year: Never true  Transportation Needs: No Transportation Needs  . Lack of Transportation (Medical): No  . Lack of Transportation (Non-Medical): No  Physical Activity: Insufficiently Active  . Days of Exercise per Week: 1 day  . Minutes of Exercise per Session: 20 min  Stress:  No Stress Concern Present  . Feeling of Stress : Not at all  Social Connections: Moderately Integrated  . Frequency of Communication with Friends and Family: Three times a week  . Frequency of Social Gatherings with Friends and Family: Three times a week  . Attends Religious Services: 1 to 4 times per year  . Active Member of Clubs or Organizations: No  . Attends Archivist Meetings: Never  . Marital Status: Living with partner  Intimate Partner Violence: Not At Risk  . Fear of Current or Ex-Partner: No  . Emotionally Abused: No  . Physically Abused: No  . Sexually Abused: No    FAMILY HISTORY:  Family History  Problem Relation Age of Onset  . Coronary artery disease Father   . Diabetes type II Father   . Diabetes Father   . Heart attack Father   . Hypertension Mother     CURRENT MEDICATIONS:  Current Outpatient Medications  Medication Sig Dispense Refill  . acetaminophen (TYLENOL) 160 MG/5ML solution Place 10.2-20.3 mLs (325-650 mg total) into feeding tube every 4 (four) hours as needed for mild pain or moderate pain. 120 mL  0  . blood glucose meter kit and supplies Test glucose once daily. ICD 10 E11.9 1 each 5  . insulin aspart (NOVOLOG) 100 UNIT/ML FlexPen Inject 3 Units into the skin 3 (three) times daily with meals. 15 mL 1  . insulin aspart (NOVOLOG) 100 UNIT/ML FlexPen CBG < 70: Implement Hypoglycemia Standing Orders and refer to Hypoglycemia Standing Orders sidebar report  CBG < 120 (dose in units): 0  CBG 120 - 160 (dose in units): 2  CBG 161 - 200 (dose in units): 4  CBG 201 - 250 (dose in units): 8  CBG 251 - 300 (dose in units): 12  CBG 301 - 350 (dose in units) 16  CBG 351 - 450 (dose in units): 20  CBG > 450 24 units and obtain STAT glucose and notify MD  *this will be a short term medication that will be susceptible to changes when your tube feedings are changes 15 mL 0  . insulin detemir (LEVEMIR) 100 UNIT/ML injection Inject 0.2 mLs (20 Units total) into the skin daily. 10 mL 11  . Insulin Pen Needle (PEN NEEDLES 3/16") 31G X 5 MM MISC Inject 3 Units as directed 3 (three) times daily. 50 each 1  . metoprolol tartrate (LOPRESSOR) 25 MG tablet Place 1 tablet (25 mg total) into feeding tube 2 (two) times daily. Crush finely and insert in Jtube 60 tablet 1  . Nutritional Supplements (FEEDING SUPPLEMENT, OSMOLITE 1.5 CAL,) LIQD Place 1,000 mLs into feeding tube continuous for 15 days. 25200 mL 0  . Nutritional Supplements (FEEDING SUPPLEMENT, PROSOURCE TF,) liquid Place 45 mLs into feeding tube 3 (three) times daily for 15 days. 2025 mL 0  . pantoprazole (PROTONIX) 40 MG tablet SMARTSIG:2 Tablet(s) By Mouth 10 Times Daily PRN    . pantoprazole sodium (PROTONIX) 40 mg/20 mL PACK 24m./ml, 20 ml via J-Tube daily 600 mL 2   No current facility-administered medications for this visit.    ALLERGIES:  Allergies  Allergen Reactions  . Adhesive [Tape] Rash    PHYSICAL EXAM:  Performance status (ECOG): 1 - Symptomatic but completely ambulatory  Vitals:   12/06/19 1143  BP: 134/73  Pulse: 67   Resp: 16  Temp: 98.3 F (36.8 C)  SpO2: 97%   Wt Readings from Last 3 Encounters:  12/06/19 214 lb 4.8 oz (97.2 kg)  12/03/19 222 lb (100.7 kg)  11/26/19 222 lb (100.7 kg)   Physical Exam Vitals reviewed.  Constitutional:      Appearance: Normal appearance.  Cardiovascular:     Rate and Rhythm: Normal rate. Rhythm irregular.     Pulses: Normal pulses.     Heart sounds: Normal heart sounds.  Pulmonary:     Effort: Pulmonary effort is normal.     Breath sounds: Normal breath sounds.  Abdominal:     Comments: J-tube in LUQ  Neurological:     General: No focal deficit present.     Mental Status: He is alert and oriented to person, place, and time.  Psychiatric:        Mood and Affect: Mood normal.        Behavior: Behavior normal.      LABORATORY DATA:  I have reviewed the labs as listed.  CBC Latest Ref Rng & Units 12/06/2019 12/04/2019 11/23/2019  WBC 4.0 - 10.5 K/uL 10.4 7.8 7.8  Hemoglobin 13.0 - 17.0 g/dL 12.7(L) 12.0(L) 9.8(L)  Hematocrit 39 - 52 % 40.1 37.1(L) 30.2(L)  Platelets 150 - 400 K/uL 339 288 288   CMP Latest Ref Rng & Units 12/06/2019 12/04/2019 11/19/2019  Glucose 70 - 99 mg/dL 150(H) 149(H) 162(H)  BUN 6 - 20 mg/dL 22(H) 18 7  Creatinine 0.61 - 1.24 mg/dL 0.54(L) 0.63 0.58(L)  Sodium 135 - 145 mmol/L 136 135 133(L)  Potassium 3.5 - 5.1 mmol/L 3.7 4.5 4.0  Chloride 98 - 111 mmol/L 99 98 99  CO2 22 - 32 mmol/L _0 Calcium 8.9 - 10.3 mg/dL 8.6(L) 9.1 8.4(L)  Total Protein 6.5 - 8.1 g/dL 7.2 6.8 -  Total Bilirubin 0.3 - 1.2 mg/dL 0.4 0.5 -  Alkaline Phos 38 - 126 U/L 103 101 -  AST 15 - 41 U/L 26 21 -  ALT 0 - 44 U/L 48(H) 55(H) -    DIAGNOSTIC IMAGING:  I have independently reviewed the scans and discussed with the patient. CT ABDOMEN WO CONTRAST  Result Date: 12/04/2019 CLINICAL DATA:  Epigastric pain and difficulty using jejunostomy catheter EXAM: CT ABDOMEN WITHOUT CONTRAST TECHNIQUE: Multidetector CT imaging of the abdomen was performed  following the standard protocol without IV contrast. COMPARISON:  11/13/2019 FINDINGS: Lower chest: Right-sided pleural effusion is noted stable from the prior exam. Some associated right basilar atelectasis is noted. Hepatobiliary: No focal liver abnormality is seen. No gallstones, gallbladder wall thickening, or biliary dilatation. Pancreas: Unremarkable. No pancreatic ductal dilatation or surrounding inflammatory changes. Spleen: Normal in size without focal abnormality. Adrenals/Urinary Tract: Adrenal glands are within normal limits. Kidneys demonstrate bilateral nonobstructing renal calculi similar to that noted on prior exam. Ureters are within normal limits. The bladder is not visualized on this study. Stomach/Bowel: Visualized bowel demonstrates evidence of prior esophagectomy and gastric pull-through. Small bowel and colon appear within normal limits. The appendix is not well visualized. No inflammatory changes to suggest appendicitis are noted. Jejunostomy catheter is noted and appears coiled upon itself with some kinking. Given the clinical history, this likely contributes to the catheter malfunction. Vascular/Lymphatic: No significant vascular findings are present. No enlarged abdominal or pelvic lymph nodes. Other: No abdominal wall hernia or abnormality. Musculoskeletal: Degenerative changes are noted. IMPRESSION: Right-sided pleural effusion stable from prior exam. Associated atelectasis is noted. Nonobstructing bilateral renal calculi stable from the previous study. Kinking of the known jejunostomy catheter. This likely contributes to the poor function of the catheter. No other focal abnormality is noted.  Electronically Signed   By: Inez Catalina M.D.   On: 12/04/2019 09:50   CT Chest Wo Contrast  Result Date: 11/14/2019 CLINICAL DATA:  Esophageal cancer status post partial esophagectomy and gastric pull-through procedure. Epigastric pain, with evidence of neo esophagus perforation on previous CT.  EXAM: CT CHEST WITHOUT CONTRAST TECHNIQUE: Multidetector CT imaging of the chest was performed following the standard protocol without IV contrast. COMPARISON:  11/13/2019 FINDINGS: Cardiovascular: Unenhanced imaging of the heart and great vessels demonstrates no pericardial effusion. Normal caliber of the thoracic aorta. Extensive atherosclerosis of the coronary vessels. Mediastinum/Nodes: Postsurgical changes are again noted from esophagectomy and gastric pull-through. As seen on the previous CT, there is evidence of perforation of the neo esophagus just inferior to the level of the carina. The contained perforation extends along the ventral margin of the descending thoracic aorta. Water-soluble contrast was ingested prior to performance of the CT, and extravasated contrast outlines the contained perforation, which terminates just above the left hemidiaphragm. The remainder of the orally administered contrast outlines the gastric pull-through, and empties into the proximal small bowel. The thyroid and trachea are unremarkable.  No pathologic adenopathy. Lungs/Pleura: Small bilateral pleural effusions are again noted, right greater than left. Areas of atelectasis are seen within the right lower lobe. No pneumothorax. Upper Abdomen: Stable appearance of the upper abdomen. Oral contrast outlines the distal stomach and proximal small bowel. Musculoskeletal: No acute or destructive bony lesions. Reconstructed images demonstrate no additional findings. IMPRESSION: 1. Postsurgical changes from esophagectomy and gastric pull-through procedure. 2. 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 Administered oral contrast outlines the contained perforation within the posterior mediastinum, immediately anterior to the descending thoracic aorta. 3. Stable bilateral pleural effusions and basilar atelectasis. These results were called by telephone at the time of  interpretation on 11/14/2019 at 2:26 am to provider Dr. Christy Gentles, Who verbally acknowledged these results. Electronically Signed   By: Randa Ngo M.D.   On: 11/14/2019 02:27   CT Chest W Contrast  Addendum Date: 11/13/2019   ADDENDUM REPORT: 11/13/2019 22:47 ADDENDUM: Impression 2 should read: Diffuse circumferential thickening of the neoesophagus with a small perforation along the mesial aspect with extraluminal gas and fluid contained by the posterior mediastinum and layering adjacent the descending thoracic aorta. Some mild reactive thickening of the aortic wall may be present but without acute luminal abnormality or clear fistulization. Impression 7 should read: Postsurgical changes along the right lateral chest wall with additional abdominal trocar site to the right of a small fluid and fat containing umbilical hernia. No acute complication at the operative sites. Electronically Signed   By: Lovena Le M.D.   On: 11/13/2019 22:47   Result Date: 11/13/2019 CLINICAL DATA:  Epigastric pain, recent esophageal cancer surgery, robotic assisted Ivor-Lewis esophagectomy performed 10/18/2019 EXAM: CT CHEST, ABDOMEN, AND PELVIS WITH CONTRAST TECHNIQUE: Multidetector CT imaging of the chest, abdomen and pelvis was performed following the standard protocol during bolus administration of intravenous contrast. CONTRAST:  142m OMNIPAQUE IOHEXOL 300 MG/ML  SOLN COMPARISON:  None. FINDINGS: CT CHEST FINDINGS Cardiovascular: Normal cardiac size. Three-vessel coronary artery calcifications are present. No pericardial effusion. Minimal atherosclerotic plaque in the normal caliber thoracic aorta. Normal 3 vessel branching of the aortic arch without acute luminal abnormality of the proximal great vessels are aorta. Small amount of stranding adjacent the aorta favored to be related to the postsurgical change and complications further detailed in the mediastinal section below. Central  pulmonary arteries are normal caliber.  No large central filling defects are seen on this non tailored examination of pulmonary arteries. Right IJ approach Port-A-Cath tip terminates at the superior cavoatrial junction. No major venous abnormalities. Mediastinum/Nodes: There are postsurgical changes from recent Ivor-Lewis esophagectomy with tubularization and pull-through of the stomach. There is diffuse circumferential thickening of the esophagus however, a small outpouching is noted along the mesial aspect of the neo esophagus (2/31) with a small amount of extraluminal gas and fluid contained within the retroperitoneum and layering anterior to the aorta. Some mild reactive thickening of the aorta at wall may be present. (2/35) no clear fistulization is seen at this time. No acute abnormality of the trachea. Thyroid gland and thoracic inlet are unremarkable. No worrisome mediastinal, hilar or axillary adenopathy. Lungs/Pleura: Small left and moderate right pleural effusions with adjacent areas of atelectasis including more bandlike areas of subsegmental atelectasis in the right lower lobe. Underlying airspace disease is not fully excluded this is less favored this time. No pneumothorax. No other consolidative opacities. Musculoskeletal: Multilevel degenerative changes are present in the imaged portions of the spine. Multilevel flowing anterior osteophytosis, compatible with features of diffuse idiopathic skeletal hyperostosis (DISH). Postsurgical changes along the right lateral chest wall compatible with recent robotic assisted thoracotomy no visible displaced rib fractures. No acute or worrisome osseous lesions. Mild body wall edema. CT ABDOMEN PELVIS FINDINGS Hepatobiliary: No worrisome focal liver lesions. Smooth liver surface contour. Diffuse hepatic hypoattenuation compatible with hepatic steatosis. Sparing along the gallbladder fossa. High attenuation material layering dependently within gallbladder, could reflect small amount of biliary sludge. No  pericholecystic fluid or inflammation. No biliary ductal dilatation. Pancreas: Partial fatty replacement of the pancreas. No pancreatic ductal dilatation or surrounding inflammatory changes. Spleen: Normal in size. No concerning splenic lesions. Adrenals/Urinary Tract: Normal adrenal glands. Asymmetric delayed left nephrogram with some asymmetrically increased left perinephric stranding. No concerning renal masses. No obstructive urolithiasis or hydronephrosis. Few scattered nonobstructing calculi in both kidneys. Bladder is decompressed mild circumferential bladder wall thickening perivesicular haze is present however. Stomach/Bowel: Postsurgical changes of the neo esophagus with potential perforation in the posterior mediastinum, as detailed above. Stable line extending through the diaphragmatic hiatus in the upper abdomen appears intact. Duodenum with a normal course across the midline abdomen. No frank small bowel wall thickening or dilatation. A percutaneous jejunostomy seen in the left upper quadrant. A normal appendix is visualized. No colonic dilatation or wall thickening. Vascular/Lymphatic: Atherosclerotic calcifications within the abdominal aorta and branch vessels. No aneurysm or ectasia. No enlarged abdominopelvic lymph nodes. 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 Reproductive: The prostate and seminal vesicles are unremarkable. Other: Some additional edematous changes in the central mesentery as well as moderate diffuse body wall edema. Postsurgical changes along the small amount of fluid contained within the umbilical fat containing hernia. Trocar site noted at the level of the umbilicus just to the right of midline. No acute abnormality of the trocar site. Musculoskeletal: Multilevel degenerative changes are present in the imaged portions of the spine.  Levocurvature of the lumbar spine apex L4. Partial ankylosis across the right SI joint. Additional moderate degenerative changes in the bilateral hips. IMPRESSION: 1. Postsurgical changes from recent Ivor-Lewis esophagectomy with tubularization and pull-through of the stomach. 2. Diffuse circumferential thickening of the neoesophagus with a small outpouching along the mesial aspect with a small amount of extraluminal gas and fluid  contained within the retroperitoneum and layering adjacent the descending thoracic aorta. Some mild reactive thickening of the aorta at wall may be present. No clear fistulization is seen at this time. 3. Small left and moderate right pleural effusions with adjacent areas of atelectasis including more bandlike areas of subsegmental atelectasis in the right lower lobe. Underlying airspace disease is not fully excluded though less favored. 4. Asymmetric delayed left nephrogram with some asymmetrically increased left perinephric stranding. Findings are concerning for ascending urinary tract infection. Recommend correlation with urinalysis. 5. Additional bilateral nonobstructing nephrolithiasis. 6. Hepatic steatosis. 7. Postsurgical changes along the small amount of fluid contained within the umbilical fat containing hernia. No acute abnormality of the trocar site. 8. Aortic Atherosclerosis (ICD10-I70.0). These results were called by telephone at the time of interpretation on 11/13/2019 at 10:39 pm to provider Evalee Jefferson, who verbally acknowledged these results. Electronically Signed: By: Lovena Le M.D. On: 11/13/2019 22:41   CT ABDOMEN PELVIS W CONTRAST  Addendum Date: 11/13/2019   ADDENDUM REPORT: 11/13/2019 22:47 ADDENDUM: Impression 2 should read: Diffuse circumferential thickening of the neoesophagus with a small perforation along the mesial aspect with extraluminal gas and fluid contained by the posterior mediastinum and layering adjacent the descending thoracic aorta. Some mild  reactive thickening of the aortic wall may be present but without acute luminal abnormality or clear fistulization. Impression 7 should read: Postsurgical changes along the right lateral chest wall with additional abdominal trocar site to the right of a small fluid and fat containing umbilical hernia. No acute complication at the operative sites. Electronically Signed   By: Lovena Le M.D.   On: 11/13/2019 22:47   Result Date: 11/13/2019 CLINICAL DATA:  Epigastric pain, recent esophageal cancer surgery, robotic assisted Ivor-Lewis esophagectomy performed 10/18/2019 EXAM: CT CHEST, ABDOMEN, AND PELVIS WITH CONTRAST TECHNIQUE: Multidetector CT imaging of the chest, abdomen and pelvis was performed following the standard protocol during bolus administration of intravenous contrast. CONTRAST:  178m OMNIPAQUE IOHEXOL 300 MG/ML  SOLN COMPARISON:  None. FINDINGS: CT CHEST FINDINGS Cardiovascular: Normal cardiac size. Three-vessel coronary artery calcifications are present. No pericardial effusion. Minimal atherosclerotic plaque in the normal caliber thoracic aorta. Normal 3 vessel branching of the aortic arch without acute luminal abnormality of the proximal great vessels are aorta. Small amount of stranding adjacent the aorta favored to be related to the postsurgical change and complications further detailed in the mediastinal section below. Central pulmonary arteries are normal caliber. No large central filling defects are seen on this non tailored examination of pulmonary arteries. Right IJ approach Port-A-Cath tip terminates at the superior cavoatrial junction. No major venous abnormalities. Mediastinum/Nodes: There are postsurgical changes from recent Ivor-Lewis esophagectomy with tubularization and pull-through of the stomach. There is diffuse circumferential thickening of the esophagus however, a small outpouching is noted along the mesial aspect of the neo esophagus (2/31) with a small amount of extraluminal  gas and fluid contained within the retroperitoneum and layering anterior to the aorta. Some mild reactive thickening of the aorta at wall may be present. (2/35) no clear fistulization is seen at this time. No acute abnormality of the trachea. Thyroid gland and thoracic inlet are unremarkable. No worrisome mediastinal, hilar or axillary adenopathy. Lungs/Pleura: Small left and moderate right pleural effusions with adjacent areas of atelectasis including more bandlike areas of subsegmental atelectasis in the right lower lobe. Underlying airspace disease is not fully excluded this is less favored this time. No pneumothorax. No other consolidative opacities. Musculoskeletal: Multilevel degenerative changes are present  in the imaged portions of the spine. Multilevel flowing anterior osteophytosis, compatible with features of diffuse idiopathic skeletal hyperostosis (DISH). Postsurgical changes along the right lateral chest wall compatible with recent robotic assisted thoracotomy no visible displaced rib fractures. No acute or worrisome osseous lesions. Mild body wall edema. CT ABDOMEN PELVIS FINDINGS Hepatobiliary: No worrisome focal liver lesions. Smooth liver surface contour. Diffuse hepatic hypoattenuation compatible with hepatic steatosis. Sparing along the gallbladder fossa. High attenuation material layering dependently within gallbladder, could reflect small amount of biliary sludge. No pericholecystic fluid or inflammation. No biliary ductal dilatation. Pancreas: Partial fatty replacement of the pancreas. No pancreatic ductal dilatation or surrounding inflammatory changes. Spleen: Normal in size. No concerning splenic lesions. Adrenals/Urinary Tract: Normal adrenal glands. Asymmetric delayed left nephrogram with some asymmetrically increased left perinephric stranding. No concerning renal masses. No obstructive urolithiasis or hydronephrosis. Few scattered nonobstructing calculi in both kidneys. Bladder is  decompressed mild circumferential bladder wall thickening perivesicular haze is present however. Stomach/Bowel: Postsurgical changes of the neo esophagus with potential perforation in the posterior mediastinum, as detailed above. Stable line extending through the diaphragmatic hiatus in the upper abdomen appears intact. Duodenum with a normal course across the midline abdomen. No frank small bowel wall thickening or dilatation. A percutaneous jejunostomy seen in the left upper quadrant. A normal appendix is visualized. No colonic dilatation or wall thickening. Vascular/Lymphatic: Atherosclerotic calcifications within the abdominal aorta and branch vessels. No aneurysm or ectasia. No enlarged abdominopelvic lymph nodes. 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 Reproductive: The prostate and seminal vesicles are unremarkable. Other: Some additional edematous changes in the central mesentery as well as moderate diffuse body wall edema. Postsurgical changes along the small amount of fluid contained within the umbilical fat containing hernia. Trocar site noted at the level of the umbilicus just to the right of midline. No acute abnormality of the trocar site. Musculoskeletal: Multilevel degenerative changes are present in the imaged portions of the spine. Levocurvature of the lumbar spine apex L4. Partial ankylosis across the right SI joint. Additional moderate degenerative changes in the bilateral hips. IMPRESSION: 1. Postsurgical changes from recent Ivor-Lewis esophagectomy with tubularization and pull-through of the stomach. 2. Diffuse circumferential thickening of the neoesophagus with a small outpouching along the mesial aspect with a small amount of extraluminal gas and fluid contained within the retroperitoneum and layering adjacent the descending thoracic aorta. Some mild  reactive thickening of the aorta at wall may be present. No clear fistulization is seen at this time. 3. Small left and moderate right pleural effusions with adjacent areas of atelectasis including more bandlike areas of subsegmental atelectasis in the right lower lobe. Underlying airspace disease is not fully excluded though less favored. 4. Asymmetric delayed left nephrogram with some asymmetrically increased left perinephric stranding. Findings are concerning for ascending urinary tract infection. Recommend correlation with urinalysis. 5. Additional bilateral nonobstructing nephrolithiasis. 6. Hepatic steatosis. 7. Postsurgical changes along the small amount of fluid contained within the umbilical fat containing hernia. No acute abnormality of the trocar site. 8. Aortic Atherosclerosis (ICD10-I70.0). These results were called by telephone at the time of interpretation on 11/13/2019 at 10:39 pm to provider Evalee Jefferson, who verbally acknowledged these results. Electronically Signed: By: Lovena Le M.D. On: 11/13/2019 22:41   IR Replc Duoden/Jejuno Tube Percut W/Fluoro  Result Date: 12/04/2019 INDICATION: History of esophagectomy and jejunostomy tube placement on 10/18/2019. Recurrent occlusion and  kinking of indwelling jejunostomy tube. The tube is now completely occluded and nonfunctional. Flushing attempts as well as wiring and de-clog attempts have been unsuccessful. EXAM: REPLACEMENT OF JEJUNOSTOMY TUBE UNDER FLUOROSCOPY MEDICATIONS: None ANESTHESIA/SEDATION: None CONTRAST:  15 mL Omnipaque 300-administered into the gastric lumen. FLUOROSCOPY TIME:  Fluoroscopy Time: 46 seconds.  6.0 mGy. COMPLICATIONS: None immediate. PROCEDURE: Informed written consent was obtained from the patient after a thorough discussion of the procedural risks, benefits and alternatives. All questions were addressed. Maximal Sterile Barrier Technique was utilized including caps, mask, sterile gowns, sterile gloves, sterile drape,  hand hygiene and skin antiseptic. A timeout was performed prior to the initiation of the procedure. Fluoroscopy was performed of the pre-existing jejunostomy tube. Prior to prepping the catheter, attempt was made to flush the catheter, advance a guidewire and also advanced a dedicated de-clogging brush device. The pre-existing tube was retracted under fluoroscopy, eventually cut and removed over a guidewire. A new 25 French balloon retention catheter was then trimmed and advanced over the wire. The retention balloon was inflated with 5 mL of sterile saline. The new jejunostomy catheter was then injected with contrast material and positioning confirmed by fluoroscopy. FINDINGS: The pre-existing jejunostomy tube was completely occluded and could not be successfully de-clogged with flushing, guidewire and de-clogging brush advancement maneuvers. After catheter removal over a guidewire, there was a appearing kink involving the distal portion of the catheter at the level of what appears to be placement of a side hole in the distal portion of the catheter. A new 58 French balloon retention catheter was advanced over the wire into the jejunum and is normally patent with the tip located in the jejunum. Additional sideholes were not added to this catheter and this is a single lumen end-hole jejunostomy. The catheter may be used immediately. IMPRESSION: Replacement of occluded 16 French jejunostomy tube for new 16 French balloon retention jejunostomy. The new catheter tip lies in the jejunum and is ready for immediate use. The occluded catheter was kinked as suspected by CT and fluoroscopy at a level of side hole creation where there was some relative weakening of the integrity of the catheter. Electronically Signed   By: Aletta Edouard M.D.   On: 12/04/2019 14:52   IR Cm Inj Any Colonic Tube W/Fluoro  Result Date: 11/17/2019 INDICATION: Concern for clogged feeding jejunostomy tube. EXAM: FLUOROSCOPIC GUIDED INJECTION OF  FEEDING JEJUNOSTOMY TUBE COMPARISON:  None. MEDICATIONS: None. CONTRAST:  36m OMNIPAQUE IOHEXOL 300 MG/ML SOLN administered into the jejunal lumen FLUOROSCOPY TIME:  12 seconds (7 mGy) COMPLICATIONS: None immediate. PROCEDURE: The patient is feeding jejunostomy catheter was flushed with a small amount of saline resulting in normal functionality of the jejunostomy catheter As such, the patient was placed supine on the fluoroscopy table. Contrast injection demonstrated appropriate positioning and functionality of the feeding jejunostomy catheter with brisk opacification of the small bowel. The jejunostomy catheter was flushed with a small amount of saline and capped. A dressing was applied. The patient tolerated the procedure well without immediate postprocedural complication. IMPRESSION: Technically successful bedside unclogging of feeding jejunostomy catheter. No exchange performed. The feeding jejunostomy catheter is ready for immediate use. Electronically Signed   By: JSandi MariscalM.D.   On: 11/17/2019 13:14   DG Chest Port 1 View  Result Date: 11/15/2019 CLINICAL DATA:  Chest pain with recent esophagectomy EXAM: PORTABLE CHEST 1 VIEW COMPARISON:  Chest radiograph November 13, 2019 and chest CT November 14, 2019 FINDINGS: Port-A-Cath tip is in the superior vena cava.  No pneumothorax. There is apparent layering effusion on the right, better appreciated on CT from 1 day prior. There is no appreciable edema or consolidation. Soft tissue prominence in the area of esophagectomy is rather subtle by radiography and much better seen on recent CT. No edema or airspace opacity. Heart size normal. No adenopathy appreciable by radiography. No bone lesions. IMPRESSION: No pneumothorax. Apparent layering pleural effusion on the right, better seen by recent CT. No edema or airspace opacity. Stable cardiomediastinal silhouette with soft tissue prominence from recent esophagectomy much better seen on recent CT. Note that the contrast  indicating suspected perforation of the neo esophagus seen 1 day prior is not appreciable on current radiographic examination. Electronically Signed   By: Lowella Grip III M.D.   On: 11/15/2019 07:36   DG Chest Port 1 View  Result Date: 11/13/2019 CLINICAL DATA:  Chest pain EXAM: PORTABLE CHEST 1 VIEW COMPARISON:  October 25, 2019 FINDINGS: There is a stable right-sided Port-A-Cath. The heart size is unchanged parent there are probable trace bilateral pleural effusions, right greater than left. There is no pneumothorax. No focal infiltrate. There is no acute osseous abnormality. There are gas distended loops of bowel in the upper abdomen. IMPRESSION: 1. Probable trace bilateral pleural effusions, right greater than left. 2. Gas distended loops of bowel in the upper abdomen. Electronically Signed   By: Constance Holster M.D.   On: 11/13/2019 21:22   DG ESOPHAGUS W SINGLE CM (SOL OR THIN BA)  Result Date: 11/16/2019 CLINICAL DATA:  Status post esophagectomy with gastric pull-through. Contrast leak on recent water-soluble esophagram. EXAM: ESOPHOGRAM/BARIUM SWALLOW TECHNIQUE: Single contrast examination was performed using water-soluble contrast material. FLUOROSCOPY TIME:  Fluoroscopy Time:  0 minutes and 30 seconds. Radiation Exposure Index (if provided by the fluoroscopic device): 72.1 mGy Number of Acquired Spot Images: COMPARISON:  Chest CT 11/14/2019. FINDINGS: Similar to yesterday's CT scan, there is evidence of contrast extravasation from the medial aspect of the gastric pull-through. This appears to be distal to the proximal anastomosis and below the level of the carina. Contrast tracks medially and then inferiorly to the left, anterior to the spine. The extravasated contrast material flows in a track mimicking the course of the native esophagus prior to resection. Today's fluoroscopy image is very similar to the coronal CT image 94 of series 5 on the CT scan from 2 days ago. The extravasated  contrast material appears to remain within the mediastinum. IMPRESSION: 1. Similar appearance of contrast extravasation from the proximal aspect of the gastric pull-through into the mediastinum with contrast material tracking inferiorly and to the left in front of the spine. Extravasated contrast appears to remain contained within the mediastinum. Electronically Signed   By: Misty Stanley M.D.   On: 11/16/2019 12:35   DG ESOPHAGUS W SINGLE CM (SOL OR THIN BA)  Result Date: 11/14/2019 CLINICAL DATA:  Esophageal cancer with gastric pull-through, evidence of perforation on previous CT EXAM: ESOPHOGRAM/BARIUM SWALLOW TECHNIQUE: Single contrast examination was performed using water-soluble contrast. FLUOROSCOPY TIME:  Fluoroscopy Time:  27 seconds Radiation Exposure Index (if provided by the fluoroscopic device): 19.87 mGy Number of Acquired Spot Images: 0 COMPARISON:  11/13/2019 FINDINGS: Under fluoroscopic visualization, the patient ingested water-soluble contrast to evaluate the neo esophagus perforation seen on prior CT. Because of equipment malfunction, the fluoroscopic images were not saved and no images could be submitted to PACS. During real-time evaluation, the water-soluble contrast could be seen outlining the gastric pull-through, with extravasation into the posterior mediastinum  corresponding to the contained perforation seen on previous CT. A subsequent CT scan was ordered by the emergency room, clearly outlining the contained perforation in the posterior mediastinum ventral to the descending thoracic aorta. IMPRESSION: 1. Single-contrast esophagram with water-soluble contrast demonstrating contained perforation of the gastric pull-through procedure. Because of archive malfunction, fluoroscopic images were not saved. Subsequent CT scan was performed demonstrating extravasated oral contrast outlining the contained perforation in the posterior mediastinum. These results were called by telephone at the time  of interpretation on 11/14/2019 at 2:26 am to provider Dr. Christy Gentles, Who verbally acknowledged these results. Electronically Signed   By: Randa Ngo M.D.   On: 11/14/2019 02:26     ASSESSMENT:  1. GE junction adenocarcinoma: -PET scan on 07/20/2019 shows uptake in the mass with nonhypermetabolic gastrohepatic lymph nodes with no evidence of metastatic disease. -Chemoradiation therapy with weekly carboplatin and paclitaxel from 07/26/2019 through 08/30/2019. -PET scan on 10/01/2019 showed mild residual hypermetabolism involving distal esophagus, maximum SUV 4.3 with associated mild wall thickening extending from the distal esophagus to the GE junction.  No metastatic disease. -Ivor Lewis esophagectomy on 10/18/2019, YPT0Y PN 0.   PLAN:  1. GE junction adenocarcinoma: -He had excellent results with chemoradiation therapy with complete pathological response. -Unfortunately he developed esophageal perforation. -He is currently using J-tube for nutrition.  He is feeling weak. -He will continue follow-up with Dr. Kipp Brood.  I plan to see him back in 2 months.  I will repeat scans at that time. -We will make a referral to physical therapy and Occupational Therapy.  2. Weight loss/dysphagia: -He is using a J-tube for nutrition.  He is taking an Osmolite 1.5 around-the-clock.  3. Diabetes: -Continue Glucovance and Levemir.     Orders placed this encounter:  No orders of the defined types were placed in this encounter.    Derek Jack, MD [facility] [phone]   I, Milinda Antis, am acting as a scribe for Dr. Sanda Linger.  I, Derek Jack MD, have reviewed the above documentation for accuracy and completeness, and I agree with the above.

## 2019-12-06 NOTE — Telephone Encounter (Signed)
Steven Crane from GI called back to let us know that Dr. Kipp Brood was in [place] today and she would let us know when he answered message.

## 2019-12-06 NOTE — Telephone Encounter (Signed)
Left message to return call with pt to see which pharm he wants the [facility] system to go to and I will call GI doc to ask about the tube feedings.

## 2019-12-06 NOTE — Telephone Encounter (Signed)
Also called his gi Dr. Tera [facility]'s nurse Caryl Pina and left a message for her to call back about the  tube feedings.  GI office number is [phone].

## 2019-12-06 NOTE — Telephone Encounter (Signed)
Kru wanted [facility] in [place]

## 2019-12-06 NOTE — Patient Instructions (Signed)
[place] at [facility] Discharge Instructions  You were seen today by Dr. Delton Coombes. He went over your recent results and scans. You will be referred to PT/OT for further therapy. Dr. Delton Coombes will see you back in 2 months for labs and follow up.   Thank you for choosing [place] at [facility] to provide your oncology and hematology care.  To afford each patient quality time with our provider, please arrive at least 15 minutes before your scheduled appointment time.   If you have a lab appointment with the [facility] please come in thru the Main Entrance and check in at the main information desk  You need to re-schedule your appointment should you arrive 10 or more minutes late.  We strive to give you quality time with our providers, and arriving late affects you and other patients whose appointments are after yours.  Also, if you no show three or more times for appointments you may be dismissed from the clinic at the providers discretion.     Again, thank you for choosing [facility].  Our hope is that these requests will decrease the amount of time that you wait before being seen by our physicians.       _____________________________________________________________  Should you have questions after your visit to [facility], please contact our office at [phone] between the hours of 8:00 a.m. and 4:30 p.m.  Voicemails left after 4:00 p.m. will not be returned until the following business day.  For prescription refill requests, have your pharmacy contact our office and allow 72 hours.    Cancer Center Support Programs:   > Cancer Support Group  2nd Tuesday of the month 1pm-2pm, Journey Room

## 2019-12-07 ENCOUNTER — Telehealth: Payer: Self-pay

## 2019-12-07 ENCOUNTER — Encounter (HOSPITAL_COMMUNITY): Payer: Self-pay

## 2019-12-07 NOTE — Telephone Encounter (Signed)
Discussed with pt. Pt verbalized understanding.  °

## 2019-12-07 NOTE — Telephone Encounter (Signed)
Dr. Juan Quam nurse Caryl Pina called back and states Dr. Lennart Pall states he has to do Continous during the day or at night.

## 2019-12-07 NOTE — Progress Notes (Signed)
Home Health PT/OT order faxed to [facility].

## 2019-12-07 NOTE — Telephone Encounter (Signed)
Abigail Butts with Dr. Tanna Furry office at [facility] [phone] contacted the office requesting patient's J tube feedings be changed from continuous to 3-4 x's daily instead.  Per Dr. Kipp Brood patient could transition continuous tube feeds to nocturnal feeds, but patient has complained about pain with increasing feedings.  Also stated that you cannot bolus jejunal feeds.  Abigail Butts at Dr. Tanna Furry office made aware.

## 2019-12-07 NOTE — Telephone Encounter (Signed)
Ok, pls call the patient to let him know what the GI doctor said.   Thx.   Dr. Lovena Le

## 2019-12-08 ENCOUNTER — Ambulatory Visit
Admission: RE | Admit: 2019-12-08 | Discharge: 2019-12-08 | Disposition: A | Discharge status: Home / Self Care | Payer: BC Managed Care – PPO | Source: Ambulatory Visit | Attending: Thoracic Surgery (Cardiothoracic Vascular Surgery) | Admitting: Thoracic Surgery (Cardiothoracic Vascular Surgery)

## 2019-12-08 ENCOUNTER — Other Ambulatory Visit: Payer: Self-pay

## 2019-12-08 DIAGNOSIS — C159 Malignant neoplasm of esophagus, unspecified: Secondary | ICD-10-CM

## 2019-12-09 ENCOUNTER — Telehealth (HOSPITAL_COMMUNITY): Payer: Self-pay | Admitting: Surgery

## 2019-12-09 NOTE — Telephone Encounter (Signed)
Ben from [facility] called to see if Dr. Delton Coombes would approve of seeing the pt once a week for 1 week, then twice a week for 2 weeks.  The pt refused PT twice this week, so the pt will begin twice a week PT next week.  Dr. Delton Coombes approved this plan and Suezanne Jacquet from [facility] verbalized understanding.

## 2019-12-10 ENCOUNTER — Ambulatory Visit (HOSPITAL_COMMUNITY): Payer: BC Managed Care – PPO

## 2019-12-10 ENCOUNTER — Encounter (HOSPITAL_COMMUNITY): Payer: BC Managed Care – PPO

## 2019-12-10 ENCOUNTER — Encounter: Payer: Self-pay | Admitting: Thoracic Surgery (Cardiothoracic Vascular Surgery)

## 2019-12-10 ENCOUNTER — Other Ambulatory Visit: Payer: Self-pay | Admitting: *Deleted

## 2019-12-10 ENCOUNTER — Ambulatory Visit (INDEPENDENT_AMBULATORY_CARE_PROVIDER_SITE_OTHER): Payer: Self-pay | Admitting: Thoracic Surgery (Cardiothoracic Vascular Surgery)

## 2019-12-10 ENCOUNTER — Other Ambulatory Visit: Payer: Self-pay

## 2019-12-10 VITALS — BP 150/92 | HR 69 | Temp 97.5°F | Resp 20 | Ht 74.0 in | Wt 217.0 lb

## 2019-12-10 DIAGNOSIS — C160 Malignant neoplasm of cardia: Secondary | ICD-10-CM

## 2019-12-10 DIAGNOSIS — Z09 Encounter for follow-up examination after completed treatment for conditions other than malignant neoplasm: Secondary | ICD-10-CM

## 2019-12-10 DIAGNOSIS — K9189 Other postprocedural complications and disorders of digestive system: Secondary | ICD-10-CM

## 2019-12-10 NOTE — Progress Notes (Signed)
     [address]       [place] [address]             [phone]       Mr. Stahle comes in for follow-up appointment.  Continues to have some right upper quadrant pain.  He also states that he try some liquids and noticed a burning sensation similar to before.  His J-tube was replaced in the emergency department.  He has been tolerating his tube.  I reviewed his esophagram, and leak is much smaller, but still present.  I recommended that he undergo an EGD with stent placement so that we can at least get him back to taking oral liquids.  Additionally will attempt to transition him to cyclic tube feeds after that.  He is scheduled for August 24 EGD and stent placement.  Kobey Sides Bary Leriche

## 2019-12-10 NOTE — Progress Notes (Signed)
Nutrition Follow-up:   Patient s/p Ivor-lewis esophagectomy with pyloromyotomy and J-tube placement on 6/28.  Following surgery had contained esophageal perforation.   Spoke with patient via phone.  Patient reports that he went to see surgeon this am.  Reports that he is going to have esophageal stent placed on 8/24 so hopefully he can start some clear liquids.  Wants to get off tube feeding.  Reports that he is taking protonix and reflux is some better not completely gone.  Says that he is "pissed off" because he is setting outside because family locked the door and he does not have a key.  Says his son is coming and will be there in about 5 minutes to let him in the house.  Home health is coming today.      Medications: reviewed  Labs: reviewed  Anthropometrics:   Weight 217 lb decreased from 222 lb on 8/6   Estimated Energy Needs  Kcals: 2550-2850 Protein: 125-150 g Fluid: > 2 L  NUTRITION DIAGNOSIS: Inadequate oral intake continues relying on feeding tube for nutrition   INTERVENTION:  Patient continues on 60 ml/hr continuous 24 hr at this time (103ml/hr goal rate in 24 hr).  Patient has been unhooking tube feeding to go walk, use bathroom, etc.  Unsure how many cartons he is getting in.  RD asked if patient could increase rate of pump to give more nutrition and he said "no."  Patient not meeting nutritional needs at this time.  Patient hopeful after procedure he will be able to eat orally. Strongly encourage tube feeding while on liquids.   Patient has contact information    MONITORING, EVALUATION, GOAL: weight trends, tube feeding, intake   NEXT VISIT: Sept 3rd, phone f/u   Steven Crane, [place], [place] Registered Dietitian [phone] (mobile)

## 2019-12-13 ENCOUNTER — Telehealth: Payer: Self-pay | Admitting: Family Medicine

## 2019-12-13 ENCOUNTER — Encounter (HOSPITAL_COMMUNITY): Payer: Self-pay | Admitting: Thoracic Surgery (Cardiothoracic Vascular Surgery)

## 2019-12-13 ENCOUNTER — Other Ambulatory Visit (HOSPITAL_COMMUNITY)
Admission: RE | Admit: 2019-12-13 | Discharge: 2019-12-13 | Disposition: A | Discharge status: Home / Self Care | Payer: BC Managed Care – PPO | Source: Ambulatory Visit | Attending: Thoracic Surgery (Cardiothoracic Vascular Surgery) | Admitting: Thoracic Surgery (Cardiothoracic Vascular Surgery)

## 2019-12-13 ENCOUNTER — Telehealth (INDEPENDENT_AMBULATORY_CARE_PROVIDER_SITE_OTHER): Payer: BC Managed Care – PPO | Admitting: Family Medicine

## 2019-12-13 ENCOUNTER — Encounter: Payer: Self-pay | Admitting: Family Medicine

## 2019-12-13 ENCOUNTER — Other Ambulatory Visit: Payer: Self-pay

## 2019-12-13 DIAGNOSIS — Z20822 Contact with and (suspected) exposure to covid-19: Secondary | ICD-10-CM | POA: Diagnosis not present

## 2019-12-13 DIAGNOSIS — K9189 Other postprocedural complications and disorders of digestive system: Secondary | ICD-10-CM

## 2019-12-13 DIAGNOSIS — Z934 Other artificial openings of gastrointestinal tract status: Secondary | ICD-10-CM

## 2019-12-13 DIAGNOSIS — Z794 Long term (current) use of insulin: Secondary | ICD-10-CM

## 2019-12-13 DIAGNOSIS — E1165 Type 2 diabetes mellitus with hyperglycemia: Secondary | ICD-10-CM | POA: Diagnosis not present

## 2019-12-13 DIAGNOSIS — Z01812 Encounter for preprocedural laboratory examination: Secondary | ICD-10-CM | POA: Diagnosis not present

## 2019-12-13 DIAGNOSIS — C159 Malignant neoplasm of esophagus, unspecified: Secondary | ICD-10-CM

## 2019-12-13 LAB — SARS CORONAVIRUS 2 (TAT 6-24 HRS): SARS Coronavirus 2: NEGATIVE

## 2019-12-13 MED ORDER — INSULIN ASPART 100 UNIT/ML FLEXPEN: 3.0000 [IU] | PEN_INJECTOR | Freq: Three times a day (TID) | SUBCUTANEOUS | 1 refills | Status: AC

## 2019-12-13 NOTE — Progress Notes (Signed)
PCP - Dr. Lovena Le in [place] - Dr. Jacinta Shoe  Chest x-ray - DOS EKG - 11/15/19 Stress Test - 10/07/19 ECHO - 08/12/13 Cardiac Cath - Yes  Sleep Study -No OSA  DM Type II  COVID TEST- 12/13/19  Anesthesia review: Cardiac history  Patient denies shortness of breath, fever, cough and chest pain at PAT appointment     All instructions explained to the patient, with a verbal understanding of the material. Patient agrees to go over the instructions while at home for a better understanding. Patient also instructed to self quarantine after being tested for COVID-19. The opportunity to ask questions was provided.

## 2019-12-13 NOTE — Telephone Encounter (Signed)
Please advise. Thank you

## 2019-12-13 NOTE — Anesthesia Preprocedure Evaluation (Addendum)
Anesthesia Evaluation  Patient identified by MRN, date of birth, ID band Patient awake    Reviewed: Allergy & Precautions, NPO status , Patient's Chart, lab work & pertinent test results  Airway Mallampati: III  TM Distance: >3 FB Neck ROM: Full    Dental  (+) Dental Advisory Given, Poor Dentition, Missing, Chipped   Pulmonary asthma ,    Pulmonary exam normal breath sounds clear to auscultation       Cardiovascular hypertension, Pt. on home beta blockers and Pt. on medications + angina + CAD, + Past MI and +CHF  Normal cardiovascular exam Rhythm:Regular Rate:Normal     Neuro/Psych  Headaches, PSYCHIATRIC DISORDERS Anxiety Depression    GI/Hepatic Neg liver ROS, GERD  ,  Endo/Other  diabetes, Type 2  Renal/GU Renal disease     Musculoskeletal  (+) Arthritis ,   Abdominal   Peds  Hematology negative hematology ROS (+)   Anesthesia Other Findings   Reproductive/Obstetrics                            Anesthesia Physical Anesthesia Plan  ASA: III  Anesthesia Plan: General   Post-op Pain Management:    Induction: Intravenous  PONV Risk Score and Plan: Ondansetron  Airway Management Planned: Oral ETT  Additional Equipment: None  Intra-op Plan:   Post-operative Plan: Extubation in OR  Informed Consent: I have reviewed the patients History and Physical, chart, labs and discussed the procedure including the risks, benefits and alternatives for the proposed anesthesia with the patient or authorized representative who has indicated his/her understanding and acceptance.     Dental advisory given  Plan Discussed with: CRNA  Anesthesia Plan Comments: (PAT note written 12/13/2019 by Myra Gianotti, PA-C. )       Anesthesia Quick Evaluation                                  Anesthesia Evaluation  Patient identified by MRN, date of birth, ID band Patient  awake    Reviewed: Allergy & Precautions, NPO status , Patient's Chart, lab work & pertinent test results, reviewed documented beta blocker date and time   Airway Mallampati: II  TM Distance: >3 FB Neck ROM: Full    Dental  (+) Dental Advisory Given, Poor Dentition, Missing   Pulmonary asthma ,    Pulmonary exam normal breath sounds clear to auscultation       Cardiovascular hypertension, Pt. on home beta blockers and Pt. on medications + angina + CAD, + Past MI, + Cardiac Stents, +CHF and + DVT  Normal cardiovascular exam Rhythm:Regular Rate:Normal     Neuro/Psych  Headaches, PSYCHIATRIC DISORDERS Anxiety Depression    GI/Hepatic Neg liver ROS, GERD  Medicated,Esophageal cancer    Endo/Other  diabetes, Type 2, Oral Hypoglycemic Agents, Insulin DependentObesity   Renal/GU negative Renal ROS     Musculoskeletal  (+) Arthritis ,   Abdominal   Peds  Hematology negative hematology ROS (+)   Anesthesia Other Findings   Reproductive/Obstetrics                           Anesthesia Physical Anesthesia Plan  ASA: III  Anesthesia Plan: General   Post-op Pain Management:    Induction: Intravenous  PONV Risk Score and Plan: 3 and Midazolam, Dexamethasone and Ondansetron  Airway Management Planned: Double Lumen  EBT  Additional Equipment: Arterial line, CVP and Ultrasound Guidance Line Placement  Intra-op Plan:   Post-operative Plan: Possible Post-op intubation/ventilation  Informed Consent: I have reviewed the patients History and Physical, chart, labs and discussed the procedure including the risks, benefits and alternatives for the proposed anesthesia with the patient or authorized representative who has indicated his/her understanding and acceptance.     Dental advisory given  Plan Discussed with: CRNA  Anesthesia Plan Comments: (See APP note by Durel Salts FNP)     Anesthesia Quick Evaluation

## 2019-12-13 NOTE — Progress Notes (Signed)
Patient ID: Steven Crane, male    DOB: February 10, 1961, 59 y.o.   MRN: [id number]   Virtual Visit via Telephone Note  I connected with Steven Crane on 12/13/19 at  9:00 AM EDT by telephone and verified that I am speaking with the correct person using two identifiers.  Location: Patient: home Provider: office    I discussed the limitations, risks, security and privacy concerns of performing an evaluation and management service by telephone and the availability of in person appointments. I also discussed with the patient that there may be a patient responsible charge related to this service. The patient expressed understanding and agreed to proceed.  Chief Complaint  Patient presents with  . 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 Has J-tube in place. Also has h/o CHF, CAD, DVT, GERD, DM2, HTN.  Pt needing 2 week follow up. Pt seen last 11/30/19. Pt is having a procedure done to put stint in esophagus tomorrow. Having COVID test done at 10 this morning. Pt is wanting to know when he is going to be feeling better.   GI doc said esophagus was healing but they thought would be best to put stent in to help with his eating/drinking. Still has J-tube. Pt having covid testing today for his procedure.  Tomorrow is the outpt surgery.  Blood glucose- from 8/18-8/23. 179 159 189 166 129 157 Last night 257 189 162  Pt on continuous feeding on the J-tube. If feeding tube not going then got down to 94. 149- 257. Pt stating needing to wake up in middle of night to check BG and it's elevated.  Went to ER on 12/05/19 and had to have J-tube replaced due to it being clogged and kinked.   Medical History Steven Crane has a past medical history of Anxiety, Arthritis, Asthma, CHF (congestive heart failure) ([place]) (02/2010), Chronic bronchitis ([place]), Chronic lower back pain, Closed head injury (0737), Coronary  artery disease (May 2015), Depression, DVT (deep venous thrombosis) ([place]) (2008), Esophageal cancer ([place]), GERD (gastroesophageal reflux disease), Headache, History of kidney stones, Hyperlipemia, Hypertension, Hypertriglyceridemia, LV dysfunction, Morbid obesity ([place]), Myocardial infarction ([place]) (02/2010), Psoriasis, and Type II diabetes mellitus ([place]).   Outpatient Encounter Medications as of 12/13/2019  Medication Sig  . acetaminophen (TYLENOL) 160 MG/5ML solution Place 10.2-20.3 mLs (325-650 mg total) into feeding tube every 4 (four) hours as needed for mild pain or moderate pain.  . blood glucose meter kit and supplies Test glucose once daily. ICD 10 E11.9  . insulin aspart (NOVOLOG) 100 UNIT/ML FlexPen Inject 3 Units into the skin 3 (three) times daily with meals.  . insulin aspart (NOVOLOG) 100 UNIT/ML FlexPen CBG < 70: Implement Hypoglycemia Standing Orders and refer to Hypoglycemia Standing Orders sidebar report  CBG < 120 (dose in units): 0  CBG 120 - 160 (dose in units): 2  CBG 161 - 200 (dose in units): 4  CBG 201 - 250 (dose in units): 8  CBG 251 - 300 (dose in units): 12  CBG 301 - 350 (dose in units) 16  CBG 351 - 450 (dose in units): 20  CBG > 450 24 units and obtain STAT glucose and notify MD  *this will be a short term medication that will be susceptible to changes when your tube feedings are changes  . insulin detemir (LEVEMIR) 100 UNIT/ML injection Inject 0.2 mLs (20 Units total) into  the skin daily.  . Insulin Pen Needle (PEN NEEDLES 3/16") 31G X 5 MM MISC Inject 3 Units as directed 3 (three) times daily.  . metoprolol tartrate (LOPRESSOR) 25 MG tablet Place 1 tablet (25 mg total) into feeding tube 2 (two) times daily. Crush finely and insert in Jtube  . pantoprazole (PROTONIX) 40 MG tablet SMARTSIG:2 Tablet(s) By Mouth 10 Times Daily PRN  . pantoprazole sodium (PROTONIX) 40 mg/20 mL PACK 47m./ml, 20 ml via J-Tube daily   No facility-administered encounter medications  on file as of 12/13/2019.     Review of Systems  Constitutional: Negative for chills and fever.  HENT: Negative for congestion, ear pain, rhinorrhea, sinus pressure, sinus pain, sneezing and sore throat.        +white coating on tongue  Respiratory: Negative for cough.   Gastrointestinal:       +j- tube in place.  Endocrine: Negative for polydipsia, polyphagia and polyuria.  Skin: Negative for rash.     Vitals There were no vitals taken for this visit.  Objective:   Physical Exam  No pe due to phone visit. Assessment and Plan   1. Malignant neoplasm of esophagus, unspecified location ([place]  2. Jejunostomy tube present ([place]  3. Type 2 diabetes mellitus with hyperglycemia, with long-term current use of insulin (HCC)    Pt to f/u for procedure tomorrow for esophageal stent.    Follow Up Instructions:    I discussed the assessment and treatment plan with the patient. The patient was provided an opportunity to ask questions and all were answered. The patient agreed with the plan and demonstrated an understanding of the instructions.   The patient was advised to call back or seek an in-person evaluation if the symptoms worsen or if the condition fails to improve as anticipated.  I provided 15 minutes of non-face-to-face time during this encounter.

## 2019-12-13 NOTE — Telephone Encounter (Signed)
Pt is needing refill on the novalog but insurance states he cant fil until the 29th. He states its due to checking his sugar more often. He will be out after today.   [place]

## 2019-12-13 NOTE — Progress Notes (Signed)
Anesthesia Chart Review: SAME DAY WORK-UP   Case: [id number] Date/Time: 12/14/19 1150   Procedures:      ESOPHAGOGASTRODUODENOSCOPY (EGD) (N/A )     ESOPHAGEAL STENT PLACEMENT (N/A )   Anesthesia type: General   Pre-op diagnosis: ESOPHAGEAL LEAK   Location: [facility] OR ROOM 11 / [facility]   Surgeons: Lajuana Matte, MD      DISCUSSION: Patient is a 59 year old male scheduled for the above procedure.  He is s/p esophagoscopy, robotic assisted laparoscopy and thoracoscopy, Ivor-Lewis esophagectomy, pyloromyotomy, laparoscopic jejunostomy tube placement on 10/18/19 for esophageal cancer. He was noted to a leak at the gastric pull through on 11/14/19 imaging which has persisted.    History includes never smoker, esophageal cancer (s/p chemotherapy 07/26/19-08/30/19, s/p Ivor-Lewis esophagectomy 10/18/19), CAD (DES distal RCA & prox OM1, LVEF 40% 08/27/13; NSTEMI 03/13/20 DES prox and mid LAD, PTCA OM2 03/19/10), CHF, HTN, DM2, HLD, psoriasis, DVT (2008), asthma, chronic back pain. S/p replacement of occluded jejunostomy tube on 12/04/19 by IR. Low risk stress test 09/2019.   12/13/2019 presurgical COVID-19 test is in process.  He did have labs on 12/06/19 per oncology, but does have some same day labs orders per CT surgery.  Anesthesia team to evaluate on the day of surgery.   VS:  BP Readings from Last 3 Encounters:  12/10/19 (!) 150/92  12/06/19 134/73  12/04/19 (!) 150/63   Pulse Readings from Last 3 Encounters:  12/10/19 69  12/06/19 67  12/04/19 63    PROVIDERS: Erven Colla, DO is PCP ([place]). Last telemedicine visit 12/13/19.  Derek Jack, MD is HEM-ONC Kate Sable, MD is cardiologist with one year follow-up recommended.    LABS: As of 12/06/19, lab results include: Lab Results  Component Value Date   WBC 10.4 12/06/2019   HGB 12.7 (L) 12/06/2019   HCT 40.1 12/06/2019   PLT 339 12/06/2019   GLUCOSE 150 (H) 12/06/2019   ALT 48 (H) 12/06/2019   AST  26 12/06/2019   NA 136 12/06/2019   K 3.7 12/06/2019   CL 99 12/06/2019   CREATININE 0.54 (L) 12/06/2019   BUN 22 (H) 12/06/2019   CO2 28 12/06/2019   TSH 1.792 09/29/2019   INR 1.1 10/14/2019   HGBA1C 7.8 (H) 09/21/2019    Spirometry 10/15/19: FVC 5.09 (92%). FEV1 4.14 (98%). DLCO unc 26.62 (84%).    IMAGES: Esophogram/Barrium Swallow 12/08/19: FINDINGS: There is a persistent small tract of contrast extending medially from the proximal gastric pull-through. The extent of extraluminal contrast has decreased significantly in the interval. No additional evidence of a leak. IMPRESSION: Residual small tract of extraluminal contrast extending medially from the proximal gastric pull-through. Overall, findings appear much improved from 11/16/2019.  CT abd/pelvis 12/04/19: IMPRESSION: - Right-sided pleural effusion stable from prior exam. Associated atelectasis is noted. - Nonobstructing bilateral renal calculi stable from the previous study. - Kinking of the known jejunostomy catheter. This likely contributes to the poor function of the catheter. - No other focal abnormality is noted. - S/p replacement of J-tube by IR on 12/04/19.   1V PCXR 11/15/19: IMPRESSION: No pneumothorax. Apparent layering pleural effusion on the right, better seen by recent CT. No edema or airspace opacity. Stable cardiomediastinal silhouette with soft tissue prominence from recent esophagectomy much better seen on recent CT. Note that the contrast indicating suspected perforation of the neo esophagus seen 1 day prior is not appreciable on current radiographic examination.   EKG: 11/15/19: Normal sinus rhythm Possible Left  atrial enlargement Left axis deviation Inferior infarct , age undetermined Possible Anterior infarct , age undetermined Abnormal ECG Since last tracing rate more regular Confirmed by Noemi Chapel [phone]) on 11/13/2019 8:39:45 PM - Previous tracing on 10/29/19 had shown frequent  PACs   CV: Nuclear stress test 10/07/19:   The left ventricular ejection fraction is mildly decreased (45-54%).  Nuclear stress EF: 51%.  There was no ST segment deviation noted during stress.  Defect 1: There is a medium defect of moderate severity present in the basal inferolateral, mid inferolateral and apical lateral location.  Defect 2: There is a medium defect of mild severity present in the basal inferior, mid inferior and apical inferior location.  Defect 3: There is a small defect of mild severity present in the apex location.  Findings consistent with prior myocardial infarction.  This is a low risk study. 1. Fixed inferior/inferolateral defect. Consistent with prior infarct. Consider echocardiogram to confirm wall motion abnormality 2. No ischemia 3. Low risk study   Carotid US 11/14/16:  1. Mild diffuse bilateral carotid intimal thickening. No significant stenosis. 2. Vertebrals are patent with antegrade flow   Cardiac cath 08/27/13:  1. Unstable angina 2. Severe stenosis distal RCA (99%), now s/p successful PTCA/DES x 1 distal RCA 3. Severe stenosis first OM (99%) now s/p successful PTCA/DES x 1 OM1 4. Mild to moderate LV systolic dysfunction. LVEF 40%.   Echo 08/12/13:  - Left ventricle: The cavity size was normal. Wall thicknesswas increased in a pattern of moderate LVH. Systolic function was normal. The estimated ejection fraction was in the range of 55% to 60%. There is hypokinesis of the  basal-midinferolateral myocardium. Doppler parameters are consistent with abnormal left ventricular relaxation (grade 1 diastolic dysfunction).  - Aortic valve: Trivial regurgitation.  - Mitral valve: Calcified annulus. Trivial regurgitation.  - Right atrium: Central venous pressure: 53m Hg (est).  - Tricuspid valve: Trivial regurgitation.  - Pulmonary arteries: PA peak pressure: 282mHg (S).  - Pericardium, extracardiac: There was no pericardial effusion.  -  Impressions: Normal LV chamber size with moderate LVH and LVEF 55-60%, mid to basal inferolateral hypokinesis, grade 1 diastolic dysfunction. Normal RV function. PASP 23 mmHg.    Past Medical History:  Diagnosis Date  . Anxiety   . Arthritis    "hands, back, knees" (08/27/2013)  . Asthma   . CHF (congestive heart failure) ([place]11/2011  . Chronic bronchitis ([place]   "get it q year"  . Chronic lower back pain   . Closed head injury 1975  . Coronary artery disease May 2015   s/p successful PTCA/DES x 1 to distal RCA and PTCA/DES x 1 to OM-20 Aug 2013  . Depression   . DVT (deep venous thrombosis) ([place]2008   "LLE"  . Esophageal cancer ([place]  . GERD (gastroesophageal reflux disease)   . Headache    hs of but no longer has problems   . History of kidney stones   . Hyperlipemia   . Hypertension   . Hypertriglyceridemia   . LV dysfunction    LVEF 40% at time of cardiac cath May 2015  . Morbid obesity ([place]CK[place]  . Myocardial infarction (HCA[place]11/2011  . Psoriasis   . Type II diabetes mellitus ([place]    Past Surgical History:  Procedure Laterality Date  . BIOPSY  06/17/2019   Procedure: BIOPSY;  Surgeon: RoDaneil DolinMD;  [facility];  Service: Endoscopy;;  . COLONOSCOPY  01/06/2012   Dr. [name]: suboptimal prep. normal exam. next colonoscopy in 10 years.   . CORONARY ANGIOPLASTY WITH STENT PLACEMENT  02/2010; 08/27/2013   "1 + 3"   . ESOPHAGOGASTRODUODENOSCOPY N/A 10/18/2019   Procedure: ESOPHAGOGASTRODUODENOSCOPY (EGD);  Surgeon: Lajuana Matte, MD;  Location[facility] OR;  Service: Thoracic;  Laterality: N/A;  . ESOPHAGOGASTRODUODENOSCOPY (EGD) WITH PROPOFOL N/A 06/17/2019   Procedure: ESOPHAGOGASTRODUODENOSCOPY (EGD) WITH PROPOFOL;  Surgeon: [name], MD;  [facility];  Service: Endoscopy;  Laterality: N/A;  11:15am  . INTERCOSTAL NERVE BLOCK  10/18/2019   Procedure: INTERCOSTAL NERVE BLOCK;  Surgeon: Lajuana Matte, MD;  Location: [facility];   Service: Thoracic;;  . IR CM INJ ANY COLONIC TUBE W/FLUORO  11/17/2019  . IR IMAGING GUIDED PORT INSERTION  08/03/2019  . IR REPLC DUODEN/JEJUNO TUBE PERCUT W/FLUORO  12/04/2019  . KNEE ARTHROSCOPY Left 1981  . LEFT HEART CATHETERIZATION WITH CORONARY ANGIOGRAM N/A 08/27/2013   Procedure: LEFT HEART CATHETERIZATION WITH CORONARY ANGIOGRAM;  Surgeon: [name], MD;  Location[facility] CATH LAB;  Service: Cardiovascular;  Laterality: N/A;  . NODE DISSECTION  10/18/2019   Procedure: NODE DISSECTION;  Surgeon: Lajuana Matte, MD;  Location: [facility];  Service: Thoracic;;  . right wrist fracture surgery     . TIBIA IM NAIL INSERTION Left 08/21/2018   Procedure: INTRAMEDULLARY (IM) NAIL TIBIAL;  Surgeon: Rod Can, MD;  Location: [facility] ORS;  Service: Orthopedics;  Laterality: Left;    MEDICATIONS: No current facility-administered medications for this encounter.   Marland Kitchen acetaminophen (TYLENOL) 160 MG/5ML solution  . blood glucose meter kit and supplies  . insulin aspart (NOVOLOG) 100 UNIT/ML FlexPen  . insulin aspart (NOVOLOG) 100 UNIT/ML FlexPen  . insulin detemir (LEVEMIR) 100 UNIT/ML injection  . Insulin Pen Needle (PEN NEEDLES 3/16") 31G X 5 MM MISC  . metoprolol tartrate (LOPRESSOR) 25 MG tablet  . pantoprazole (PROTONIX) 40 MG tablet  . pantoprazole sodium (PROTONIX) 40 mg/20 mL PACK     Myra Gianotti, PA-C Surgical Short Stay/Anesthesiology [facility] Phone [phone] [facility] Phone [phone] 12/13/2019 3:09 PM

## 2019-12-13 NOTE — Telephone Encounter (Signed)
Not sure what to do about the insurance stating he can't pick it up early.  Pls send refills. Thx. Dr. Lovena Le

## 2019-12-13 NOTE — Telephone Encounter (Signed)
Refills sent to pharmacy. Left message to return call  

## 2019-12-14 ENCOUNTER — Ambulatory Visit (HOSPITAL_COMMUNITY): Payer: BC Managed Care – PPO | Admitting: Vascular Surgery

## 2019-12-14 ENCOUNTER — Encounter (HOSPITAL_COMMUNITY): Payer: Self-pay | Admitting: Thoracic Surgery (Cardiothoracic Vascular Surgery)

## 2019-12-14 ENCOUNTER — Ambulatory Visit (HOSPITAL_COMMUNITY): Payer: BC Managed Care – PPO

## 2019-12-14 ENCOUNTER — Telehealth: Payer: BC Managed Care – PPO | Admitting: Family Medicine

## 2019-12-14 ENCOUNTER — Ambulatory Visit (HOSPITAL_COMMUNITY)
Admission: RE | Admit: 2019-12-14 | Discharge: 2019-12-14 | Disposition: A | Discharge status: Home / Self Care | Payer: BC Managed Care – PPO | Attending: Thoracic Surgery (Cardiothoracic Vascular Surgery) | Admitting: Thoracic Surgery (Cardiothoracic Vascular Surgery)

## 2019-12-14 ENCOUNTER — Encounter (HOSPITAL_COMMUNITY)
Admission: RE | Disposition: A | Discharge status: Home / Self Care | Payer: Self-pay | Source: Home / Self Care | Attending: Thoracic Surgery (Cardiothoracic Vascular Surgery)

## 2019-12-14 DIAGNOSIS — Z955 Presence of coronary angioplasty implant and graft: Secondary | ICD-10-CM | POA: Diagnosis not present

## 2019-12-14 DIAGNOSIS — I110 Hypertensive heart disease with heart failure: Secondary | ICD-10-CM | POA: Insufficient documentation

## 2019-12-14 DIAGNOSIS — Z87891 Personal history of nicotine dependence: Secondary | ICD-10-CM | POA: Insufficient documentation

## 2019-12-14 DIAGNOSIS — Z8501 Personal history of malignant neoplasm of esophagus: Secondary | ICD-10-CM | POA: Insufficient documentation

## 2019-12-14 DIAGNOSIS — E119 Type 2 diabetes mellitus without complications: Secondary | ICD-10-CM | POA: Diagnosis not present

## 2019-12-14 DIAGNOSIS — K219 Gastro-esophageal reflux disease without esophagitis: Secondary | ICD-10-CM | POA: Insufficient documentation

## 2019-12-14 DIAGNOSIS — J95812 Postprocedural air leak: Secondary | ICD-10-CM

## 2019-12-14 DIAGNOSIS — Z86718 Personal history of other venous thrombosis and embolism: Secondary | ICD-10-CM | POA: Insufficient documentation

## 2019-12-14 DIAGNOSIS — I25119 Atherosclerotic heart disease of native coronary artery with unspecified angina pectoris: Secondary | ICD-10-CM | POA: Insufficient documentation

## 2019-12-14 DIAGNOSIS — I252 Old myocardial infarction: Secondary | ICD-10-CM | POA: Diagnosis not present

## 2019-12-14 DIAGNOSIS — R1310 Dysphagia, unspecified: Secondary | ICD-10-CM | POA: Diagnosis not present

## 2019-12-14 DIAGNOSIS — Z9221 Personal history of antineoplastic chemotherapy: Secondary | ICD-10-CM | POA: Diagnosis not present

## 2019-12-14 DIAGNOSIS — Z419 Encounter for procedure for purposes other than remedying health state, unspecified: Secondary | ICD-10-CM

## 2019-12-14 DIAGNOSIS — I509 Heart failure, unspecified: Secondary | ICD-10-CM | POA: Insufficient documentation

## 2019-12-14 DIAGNOSIS — K228 Other specified diseases of esophagus: Secondary | ICD-10-CM | POA: Diagnosis present

## 2019-12-14 DIAGNOSIS — K9189 Other postprocedural complications and disorders of digestive system: Secondary | ICD-10-CM

## 2019-12-14 DIAGNOSIS — Z794 Long term (current) use of insulin: Secondary | ICD-10-CM | POA: Insufficient documentation

## 2019-12-14 DIAGNOSIS — Z9049 Acquired absence of other specified parts of digestive tract: Secondary | ICD-10-CM | POA: Diagnosis not present

## 2019-12-14 HISTORY — PX: ESOPHAGOGASTRODUODENOSCOPY: SHX5428

## 2019-12-14 HISTORY — PX: ESOPHAGEAL STENT PLACEMENT: SHX5540

## 2019-12-14 LAB — CBC
HCT: 37.1 % — ABNORMAL LOW (ref 39.0–52.0)
Hemoglobin: 12 g/dL — ABNORMAL LOW (ref 13.0–17.0)
MCH: 29.2 pg (ref 26.0–34.0)
MCHC: 32.3 g/dL (ref 30.0–36.0)
MCV: 90.3 fL (ref 80.0–100.0)
Platelets: 305 10*3/uL (ref 150–400)
RBC: 4.11 MIL/uL — ABNORMAL LOW (ref 4.22–5.81)
RDW: 13.8 % (ref 11.5–15.5)
WBC: 8 10*3/uL (ref 4.0–10.5)
nRBC: 0 % (ref 0.0–0.2)

## 2019-12-14 LAB — COMPREHENSIVE METABOLIC PANEL
ALT: 69 U/L — ABNORMAL HIGH (ref 0–44)
AST: 32 U/L (ref 15–41)
Albumin: 2.7 g/dL — ABNORMAL LOW (ref 3.5–5.0)
Alkaline Phosphatase: 110 U/L (ref 38–126)
Anion gap: 9 (ref 5–15)
BUN: 13 mg/dL (ref 6–20)
CO2: 28 mmol/L (ref 22–32)
Calcium: 8.8 mg/dL — ABNORMAL LOW (ref 8.9–10.3)
Chloride: 96 mmol/L — ABNORMAL LOW (ref 98–111)
Creatinine, Ser: 0.52 mg/dL — ABNORMAL LOW (ref 0.61–1.24)
GFR calc Af Amer: >60 mL/min (ref >60)
GFR calc non Af Amer: >60 mL/min (ref >60)
Glucose, Bld: 144 mg/dL — ABNORMAL HIGH (ref 70–99)
Potassium: 3.9 mmol/L (ref 3.5–5.1)
Sodium: 133 mmol/L — ABNORMAL LOW (ref 135–145)
Total Bilirubin: 0.6 mg/dL (ref 0.3–1.2)
Total Protein: 6.6 g/dL (ref 6.5–8.1)

## 2019-12-14 LAB — PROTIME-INR
INR: 1.1 (ref 0.8–1.2)
Prothrombin Time: 13.4 seconds (ref 11.4–15.2)

## 2019-12-14 LAB — GLUCOSE, CAPILLARY
Glucose-Capillary: 132 mg/dL — ABNORMAL HIGH (ref 70–99)
Glucose-Capillary: 112 mg/dL — ABNORMAL HIGH (ref 70–99)
Glucose-Capillary: 121 mg/dL — ABNORMAL HIGH (ref 70–99)

## 2019-12-14 LAB — APTT: aPTT: 38 seconds — ABNORMAL HIGH (ref 24–36)

## 2019-12-14 SURGERY — EGD (ESOPHAGOGASTRODUODENOSCOPY)
Anesthesia: General | Site: Chest

## 2019-12-14 MED ORDER — FENTANYL CITRATE (PF) 100 MCG/2ML IJ SOLN: 25.0000 ug | INTRAMUSCULAR | Status: DC | PRN

## 2019-12-14 MED ORDER — PROPOFOL 10 MG/ML IV BOLUS
INTRAVENOUS | Status: DC | PRN
  Administered 2019-12-14: 150 mg via INTRAVENOUS

## 2019-12-14 MED ORDER — FENTANYL CITRATE (PF) 250 MCG/5ML IJ SOLN
INTRAMUSCULAR | Status: DC | PRN
Start: 2019-12-14 — End: 2019-12-14
  Administered 2019-12-14: 50 ug via INTRAVENOUS
  Administered 2019-12-14: 100 ug via INTRAVENOUS

## 2019-12-14 MED ORDER — PROMETHAZINE HCL 25 MG/ML IJ SOLN: 6.2500 mg | INTRAMUSCULAR | Status: DC | PRN

## 2019-12-14 MED ORDER — DEXAMETHASONE SODIUM PHOSPHATE 10 MG/ML IJ SOLN
INTRAMUSCULAR | Status: DC | PRN
  Administered 2019-12-14: 10 mg via INTRAVENOUS

## 2019-12-14 MED ORDER — FENTANYL CITRATE (PF) 250 MCG/5ML IJ SOLN
INTRAMUSCULAR | Status: AC
  Filled 2019-12-14: qty 5

## 2019-12-14 MED ORDER — MEPERIDINE HCL 25 MG/ML IJ SOLN: 6.2500 mg | INTRAMUSCULAR | Status: DC | PRN

## 2019-12-14 MED ORDER — MIDAZOLAM HCL 2 MG/2ML IJ SOLN
INTRAMUSCULAR | Status: AC
  Filled 2019-12-14: qty 2

## 2019-12-14 MED ORDER — MIDAZOLAM HCL 2 MG/2ML IJ SOLN
INTRAMUSCULAR | Status: DC | PRN
  Administered 2019-12-14: 2 mg via INTRAVENOUS

## 2019-12-14 MED ORDER — ONDANSETRON HCL 4 MG/2ML IJ SOLN
INTRAMUSCULAR | Status: DC | PRN
  Administered 2019-12-14: 4 mg via INTRAVENOUS

## 2019-12-14 MED ORDER — CHLORHEXIDINE GLUCONATE 0.12 % MT SOLN: 15.0000 mL | Freq: Once | OROMUCOSAL | Status: AC

## 2019-12-14 MED ORDER — SUGAMMADEX SODIUM 200 MG/2ML IV SOLN
INTRAVENOUS | Status: DC | PRN
  Administered 2019-12-14: 200 mg via INTRAVENOUS

## 2019-12-14 MED ORDER — CHLORHEXIDINE GLUCONATE 0.12 % MT SOLN
OROMUCOSAL | Status: AC
  Administered 2019-12-14: 15 mL via OROMUCOSAL
  Filled 2019-12-14: qty 15

## 2019-12-14 MED ORDER — ORAL CARE MOUTH RINSE: 15.0000 mL | Freq: Once | OROMUCOSAL | Status: AC

## 2019-12-14 MED ORDER — ROCURONIUM BROMIDE 10 MG/ML (PF) SYRINGE
PREFILLED_SYRINGE | INTRAVENOUS | Status: DC | PRN
  Administered 2019-12-14: 50 mg via INTRAVENOUS

## 2019-12-14 MED ORDER — LIDOCAINE 2% (20 MG/ML) 5 ML SYRINGE
INTRAMUSCULAR | Status: DC | PRN
  Administered 2019-12-14: 100 mg via INTRAVENOUS

## 2019-12-14 MED ORDER — IOHEXOL 300 MG/ML  SOLN
INTRAMUSCULAR | Status: DC | PRN
  Administered 2019-12-14: 28 mL

## 2019-12-14 MED ORDER — LACTATED RINGERS IV SOLN: INTRAVENOUS | Status: DC

## 2019-12-14 SURGICAL SUPPLY — 28 items
BLADE CLIPPER SURG (BLADE) ×3 IMPLANT
CANISTER SUCT 3000ML PPV (MISCELLANEOUS) ×3 IMPLANT
CNTNR URN SCR LID CUP LEK RST (MISCELLANEOUS) ×1 IMPLANT
CONT SPEC 4OZ STRL OR WHT (MISCELLANEOUS) ×3
COVER BACK TABLE 60X90IN (DRAPES) ×3 IMPLANT
DRAPE C-ARM 42X72 X-RAY (DRAPES) ×3 IMPLANT
GAUZE SPONGE 4X4 12PLY STRL (GAUZE/BANDAGES/DRESSINGS) ×3 IMPLANT
GLOVE BIO SURGEON STRL SZ7 (GLOVE) ×3 IMPLANT
GLOVE BIO SURGEON STRL SZ7.5 (GLOVE) ×3 IMPLANT
GOWN STRL REUS W/ TWL XL LVL3 (GOWN DISPOSABLE) ×1 IMPLANT
GOWN STRL REUS W/TWL XL LVL3 (GOWN DISPOSABLE) ×3
KIT TURNOVER KIT B (KITS) ×3 IMPLANT
MARKER SKIN DUAL TIP RULER LAB (MISCELLANEOUS) ×3 IMPLANT
NS IRRIG 1000ML POUR BTL (IV SOLUTION) ×3 IMPLANT
OIL SILICONE PENTAX (PARTS (SERVICE/REPAIRS)) IMPLANT
PAD ARMBOARD 7.5X6 YLW CONV (MISCELLANEOUS) ×6 IMPLANT
STENT WALLFLEX 23X15.5 (STENTS) IMPLANT
STENT WALLFLEX ESOPH 23X125MM (Stent) ×2 IMPLANT
SYR 20ML ECCENTRIC (SYRINGE) ×3 IMPLANT
SYR 30ML SLIP (SYRINGE) ×3 IMPLANT
TOWEL GREEN STERILE (TOWEL DISPOSABLE) ×3 IMPLANT
TOWEL GREEN STERILE FF (TOWEL DISPOSABLE) ×3 IMPLANT
TUBE CONNECTING 20'X1/4 (TUBING) ×1
TUBE CONNECTING 20X1/4 (TUBING) ×2 IMPLANT
TUBING ENDO SMARTCAP (MISCELLANEOUS) ×3 IMPLANT
UNDERPAD 30X36 HEAVY ABSORB (UNDERPADS AND DIAPERS) ×3 IMPLANT
WATER STERILE IRR 1000ML POUR (IV SOLUTION) ×3 IMPLANT
WIRE HYDRA 450CM (MISCELLANEOUS) ×2 IMPLANT

## 2019-12-14 NOTE — Anesthesia Procedure Notes (Signed)
Procedure Name: Intubation Date/Time: 12/14/2019 12:33 PM Performed by: Darletta Moll, CRNA Pre-anesthesia Checklist: Patient identified, Emergency Drugs available, Suction available and Patient being monitored Patient Re-evaluated:Patient Re-evaluated prior to induction Oxygen Delivery Method: Circle system utilized Preoxygenation: Pre-oxygenation with 100% oxygen Induction Type: IV induction Ventilation: Oral airway inserted - appropriate to patient size Laryngoscope Size: Mac and 4 Grade View: Grade I Tube type: Oral Tube size: 7.5 mm Number of attempts: 1 Airway Equipment and Method: Stylet and Oral airway Placement Confirmation: ETT inserted through vocal cords under direct vision,  positive ETCO2 and breath sounds checked- equal and bilateral Secured at: 22 cm Tube secured with: Tape Dental Injury: Teeth and Oropharynx as per pre-operative assessment

## 2019-12-14 NOTE — Transfer of Care (Signed)
Immediate Anesthesia Transfer of Care Note  Patient: Steven Crane  Procedure(s) Performed: ESOPHAGOGASTRODUODENOSCOPY (EGD) (N/A Chest) ESOPHAGEAL STENT PLACEMENT (N/A Chest)  Patient Location: PACU  Anesthesia Type:General  Level of Consciousness: drowsy and patient cooperative  Airway & Oxygen Therapy: Patient Spontanous Breathing  Post-op Assessment: Report given to RN, Post -op Vital signs reviewed and stable and Patient moving all extremities X 4  Post vital signs: Reviewed and stable  Last Vitals:  Vitals Value Taken Time  BP 149/83 12/14/19 1324  Temp    Pulse 66 12/14/19 1327  Resp 12 12/14/19 1327  SpO2 97 % 12/14/19 1327  Vitals shown include unvalidated device data.  Last Pain:  Vitals:   12/14/19 1017  TempSrc:   PainSc: 6       Patients Stated Pain Goal: 0 (64/40/34 7425)  Complications: No complications documented.

## 2019-12-14 NOTE — Op Note (Signed)
      [address]       [place] [address]             [phone]        12/14/2019  Patient:  [name] Pre-Op Dx: Esophageal leak   History of Ivor Lewis esophagectomy Post-op Dx: Same Procedure: - Esophagogastroscopy -23 X125 mm covered esophageal stent placement [facility]) - esophagram with Omnipaque   Surgeon and Role:      * Deandria Klute, Lucile Crater, MD - Primary  Anesthesia  general EBL: Minimal  Blood Administration: None Specimen: None   Counts: correct   Indications: MAXWEL MEADOWCROFT 59 y.o. male status post robotic assisted esophagectomy in June of this year.  His postoperative course was uncomplicated and he was discharged on postoperative day 6, but he represented a month later with a small esophageal leak.  He has been made n.p.o. for the past several weeks and has shown interval healing of this the esophageal leak.  He has attempted clear liquids with some dysphagia.  Patient has also been somewhat intolerant of J-tube feeds.  He presents today for esophageal stent placement so that he can resume taking p.o. liquids.  Findings: The esophageal gastric conduit, which appeared pink and healthy.  At approximately 27 cm from the incisors there is an angulation of healing mucosa.  There was a cavity within the gastric conduit laid down to the potential area of perforation.  Omnipaque was injected but there was no obvious leak.  I will pass the scope down further into the conduit and observed the pylorus open and closed without obstruction.  Operative Technique: After the risks, benefits and alternatives were thoroughly discussed, the patient was brought to the operative theatre.  Anesthesia was induced. The patient was prepped and draped in normal sterile fashion.  An appropriate surgical pause was performed, and pre-operative antibiotics were dosed accordingly.  The gastroscope was advanced through the oropharynx into the cervical esophagus  under direct visualization.  The scope was passed into the gastric conduit.  The esophagogastric anastomosis appeared intact.  The scope was then pulled back, and the esophageal mucosa was visualized.  A mucosal defect was noted at 27 cm from the incisors.  Contrast was injected through the scope.  There is no obvious leak.  This area was marked on the skin with paper clips.  The scope was then advanced back down towards the pylorus.  0.35 Antonietta Breach was then advanced over fluoroscopic visualization.  The esophageal stent was then placed over the wire, and positioned under fluoroscopy to cover the mucosal defect.    The patient tolerated the procedure without any immediate complications, and was transferred to the PACU in stable condition.  Redith Drach Bary Leriche

## 2019-12-14 NOTE — Discharge Summary (Signed)
Physician Discharge Summary   Patient ID: Steven Crane [id number] y.o. 1960-09-14  Admit date: 12/14/2019  Discharge date and time: No discharge date for patient encounter.   Admitting Physician: Lajuana Matte, MD   Discharge Physician: Kipp Brood  Admission Diagnoses: ESOPHAGEAL LEAK  Discharge Diagnoses: Same  Admission Condition: good  Discharged Condition: good  Indication for Admission: Patient was admitted for an EGD, and was discharged the same day.  Hospital Course: Uncomplicated  Consults: None  Discharge Exam: Alert  NAD Regular rate and rhythm Easy work of breathing No subcutaneous emphysema Abdomen soft and J-tube in place    Disposition: Discharge disposition: 01-Home or Self Care       Patient Instructions:  Resume all home medications Resume tube feeds at same right  Activity: activity as tolerated Diet: Continue tube feedings.  Okay to start clear liquids 12/15/2019  Dr. Kipp Brood will call you in 1 day for status update  Signed: Lajuana Matte 12/14/2019 2:09 PM

## 2019-12-14 NOTE — H&P (Signed)
[address]       [place] [address]             [phone]                    Nyan R Esbenshade Soldier Medical Record #[id number] Date of Birth: [birth year]  Referring: No ref. provider found Primary Care: Erven Colla, DO Primary Cardiologist: Kate Sable, MD (Inactive)  Chief Complaint:   No chief complaint on file.   History of Present Illness:    Steven Crane 59 y.o. male status post robotic assisted esophagectomy in June of this year.  His postoperative course was uncomplicated and he was discharged on postoperative day 6, but he represented a month later with a small esophageal leak.  He has been made n.p.o. for the past several weeks and has shown interval healing of this the esophageal leak.  He has attempted clear liquids with some dysphagia.  Patient has also been somewhat intolerant of J-tube feeds.  He presents today for esophageal stent placement so that he can resume taking p.o. liquids.      Past Medical History:  Diagnosis Date  . Anxiety   . Arthritis    "hands, back, knees" (08/27/2013)  . Asthma   . CHF (congestive heart failure) ([place]) 02/2010  . Chronic bronchitis ([place])    "get it q year"  . Chronic lower back pain   . Closed head injury 1975  . Coronary artery disease May 2015   s/p successful PTCA/DES x 1 to distal RCA and PTCA/DES x 1 to OM-20 Aug 2013  . Depression   . DVT (deep venous thrombosis) ([place]) 2008   "LLE"  . Esophageal cancer ([place])   . GERD (gastroesophageal reflux disease)   . Headache    hs of but no longer has problems   . History of kidney stones   . Hyperlipemia   . Hypertension   . Hypertriglyceridemia   . LV dysfunction    LVEF 40% at time of cardiac cath May 2015  . Morbid obesity ([place])   . Myocardial infarction ([place]) 02/2010  . Psoriasis   . Type II diabetes mellitus ([place])     Past Surgical History:  Procedure Laterality Date  . BIOPSY  06/17/2019   Procedure: BIOPSY;  Surgeon: [name], MD;  Location: [facility];  Service: Endoscopy;;  . COLONOSCOPY  01/06/2012   Dr. [name]: suboptimal prep. normal exam. next colonoscopy in 10 years.   . CORONARY ANGIOPLASTY WITH STENT PLACEMENT  02/2010; 08/27/2013   "1 + 3"   . ESOPHAGOGASTRODUODENOSCOPY N/A 10/18/2019   Procedure: ESOPHAGOGASTRODUODENOSCOPY (EGD);  Surgeon: Lajuana Matte, MD;  Location[facility];  Service: Thoracic;  Laterality: N/A;  . ESOPHAGOGASTRODUODENOSCOPY (EGD) WITH PROPOFOL N/A 06/17/2019   Procedure: ESOPHAGOGASTRODUODENOSCOPY (EGD) WITH PROPOFOL;  Surgeon: [name], MD;  Location: [facility];  Service: Endoscopy;  Laterality: N/A;  11:15am  . FRACTURE SURGERY    . INTERCOSTAL NERVE BLOCK  10/18/2019   Procedure: INTERCOSTAL NERVE BLOCK;  Surgeon: Lajuana Matte, MD;  Location: [place];  Service: Thoracic;;  . IR CM INJ ANY COLONIC TUBE W/FLUORO  11/17/2019  . IR IMAGING GUIDED PORT INSERTION  08/03/2019  . IR REPLC DUODEN/JEJUNO TUBE PERCUT W/FLUORO  12/04/2019  . KNEE ARTHROSCOPY Left 1981  . LEFT HEART CATHETERIZATION WITH CORONARY ANGIOGRAM N/A 08/27/2013   Procedure: LEFT HEART CATHETERIZATION WITH CORONARY ANGIOGRAM;  Surgeon: [name], MD;  Location: [facility];  Service: Cardiovascular;  Laterality: N/A;  . NODE DISSECTION  10/18/2019   Procedure: NODE DISSECTION;  Surgeon: Lajuana Matte, MD;  Location: [place];  Service: Thoracic;;  . right wrist fracture surgery     . TIBIA IM NAIL INSERTION Left 08/21/2018   Procedure: INTRAMEDULLARY (IM) NAIL TIBIAL;  Surgeon: Rod Can, MD;  Location: [facility];  Service: Orthopedics;  Laterality: Left;    Family History  Problem Relation Age of Onset  . Coronary artery disease Father   . Diabetes type II Father   . Diabetes Father   . Heart attack Father   . Hypertension Mother      Social History   Tobacco Use  Smoking Status Never Smoker  Smokeless Tobacco Former Systems developer  . Types: Snuff, Chew  Tobacco Comment     "started dipping @ age 40; quit for 5 1/2 yrs at one time; hadn't chewed in awhile"    Social History   Substance and Sexual Activity  Alcohol Use Not Currently  . Alcohol/week: 0.0 standard drinks   Comment: occ      Allergies  Allergen Reactions  . Adhesive [Tape] Rash    Current Facility-Administered Medications  Medication Dose Route Frequency Provider Last Rate Last Admin  . lactated ringers infusion   Intravenous Continuous Nolon Nations, MD 10 mL/hr at 12/14/19 1025 New Bag at 12/14/19 1025    Review of Systems  Constitutional: Positive for malaise/fatigue and weight loss.  Gastrointestinal: Positive for abdominal pain and nausea.  Musculoskeletal: Positive for myalgias.     PHYSICAL EXAMINATION: BP (!) 166/84   Pulse 61   Temp 98.7 F (37.1 C) (Oral)   Resp 18   Ht 6\' 2"  (1.88 m)   Wt 98.4 kg   SpO2 100%   BMI 27.86 kg/m  Physical Exam Constitutional:      General: He is not in acute distress.    Appearance: Normal appearance. He is not ill-appearing or toxic-appearing.  Eyes:     Extraocular Movements: Extraocular movements intact.     Conjunctiva/sclera: Conjunctivae normal.  Cardiovascular:     Rate and Rhythm: Normal rate.  Pulmonary:     Effort: Pulmonary effort is normal. No respiratory distress.  Musculoskeletal:        General: Normal range of motion.  Skin:    General: Skin is warm.  Neurological:     General: No focal deficit present.     Mental Status: He is alert.          I have independently reviewed the above radiology studies  and reviewed the findings with the patient.   Recent Lab Findings: Lab Results  Component Value Date   WBC 10.4 12/06/2019   HGB 12.7 (L) 12/06/2019   HCT 40.1 12/06/2019   PLT 339 12/06/2019   GLUCOSE 150 (H) 12/06/2019   CHOL 117 07/22/2019   TRIG 115 07/22/2019   HDL 34 (L) 07/22/2019   LDLCALC 62 07/22/2019   ALT 48 (H) 12/06/2019   AST 26 12/06/2019   NA 136 12/06/2019   K 3.7  12/06/2019   CL 99 12/06/2019   CREATININE 0.54 (L) 12/06/2019   BUN 22 (H) 12/06/2019   CO2 28 12/06/2019   TSH 1.792 09/29/2019   INR 1.1 10/14/2019   HGBA1C 7.8 (H) 09/21/2019      Assessment / Plan:   59 year old male status post Ivor Lewis esophagectomy now with a small leak  in his gastric conduit.  He has shown interval healing but this is now completely resolved.  He is going to the OR today for an EGD and esophageal stent placement.       Lajuana Matte 12/14/2019 10:32 AM

## 2019-12-14 NOTE — Anesthesia Postprocedure Evaluation (Signed)
Anesthesia Post Note  Patient: Steven Crane  Procedure(s) Performed: ESOPHAGOGASTRODUODENOSCOPY (EGD) (N/A Chest) ESOPHAGEAL STENT PLACEMENT (N/A Chest)     Patient location during evaluation: PACU Anesthesia Type: General Level of consciousness: sedated and patient cooperative Pain management: pain level controlled Vital Signs Assessment: post-procedure vital signs reviewed and stable Respiratory status: spontaneous breathing Cardiovascular status: stable Anesthetic complications: no   No complications documented.  Last Vitals:  Vitals:   12/14/19 1355 12/14/19 1410  BP: 138/75 135/80  Pulse: 66 65  Resp: 17 16  Temp: 36.7 C   SpO2: 100% 99%    Last Pain:  Vitals:   12/14/19 1410  TempSrc:   PainSc: 0-No pain                 Nolon Nations

## 2019-12-15 ENCOUNTER — Encounter (HOSPITAL_COMMUNITY): Payer: Self-pay | Admitting: Thoracic Surgery (Cardiothoracic Vascular Surgery)

## 2019-12-15 ENCOUNTER — Telehealth: Payer: Self-pay | Admitting: *Deleted

## 2019-12-15 MED ORDER — "PEN NEEDLES 3/16"" 31G X 5 MM MISC": 3.0000 [IU] | Freq: Every day | 1 refills | Status: AC

## 2019-12-15 NOTE — Telephone Encounter (Signed)
Pharmacy contact pt that pins were ready to pick up.

## 2019-12-15 NOTE — Telephone Encounter (Signed)
Pharmacy contacted.

## 2019-12-15 NOTE — Telephone Encounter (Signed)
Patient has been injecting insulin 6 x per day to attempt to keep sugars under 300. Patient is out of the pin needles. Per Dr. Lovena Le ok to send script for more pins with directions for 6 x per day and see if insurance would accept this.

## 2019-12-16 ENCOUNTER — Other Ambulatory Visit: Payer: Self-pay

## 2019-12-16 ENCOUNTER — Telehealth (HOSPITAL_COMMUNITY): Payer: Self-pay | Admitting: *Deleted

## 2019-12-16 ENCOUNTER — Ambulatory Visit (HOSPITAL_COMMUNITY)
Admission: RE | Admit: 2019-12-16 | Discharge: 2019-12-16 | Disposition: A | Discharge status: Home / Self Care | Payer: BC Managed Care – PPO | Source: Ambulatory Visit | Attending: Nurse Practitioner | Admitting: Nurse Practitioner

## 2019-12-16 ENCOUNTER — Encounter (HOSPITAL_COMMUNITY): Payer: Self-pay | Admitting: *Deleted

## 2019-12-16 ENCOUNTER — Telehealth: Payer: Self-pay

## 2019-12-16 ENCOUNTER — Other Ambulatory Visit (HOSPITAL_COMMUNITY): Payer: Self-pay | Admitting: *Deleted

## 2019-12-16 DIAGNOSIS — C160 Malignant neoplasm of cardia: Secondary | ICD-10-CM

## 2019-12-16 DIAGNOSIS — R05 Cough: Secondary | ICD-10-CM | POA: Insufficient documentation

## 2019-12-16 DIAGNOSIS — R059 Cough, unspecified: Secondary | ICD-10-CM

## 2019-12-16 NOTE — Telephone Encounter (Signed)
Patient contacted the office this morning and left a message with the answering service that he was having "difficulty breathing after her procedure".  He is s/p EGD with stent placement 12/14/19 and Ivor Lewis esophagectomy 10/18/19.  Patient did state that Dr. Kipp Brood contacted him back and advised that he should take his time swallowing liquids and should be sitting straight up while only drinking small amounts at a time.  He also stated that he could have aspirated when he drank the water.    Spoke with patient again to make sure he had spoken with someone in our office and reiterated to him that he should take his time while drinking liquids and think about swallowing.  Also advised that he should make sure he brushes his teeth and uses mouth wash when he tries to drink liquids again to prevent aspiration pneumonia.  He acknowledged receipt.  Advised patient to contact the office if he had any other questions or concerns.

## 2019-12-16 NOTE — Telephone Encounter (Signed)
Pt was seen on 12/13/19; sent Mychart message following up

## 2019-12-16 NOTE — Telephone Encounter (Signed)
Patient notified of order for CXR via his vm. I will have a front office nurse try to contact him as well again this evening to notify him.

## 2019-12-16 NOTE — Progress Notes (Signed)
Short-term disability papers completed and faxed.

## 2019-12-17 ENCOUNTER — Telehealth: Payer: Self-pay | Admitting: Family Medicine

## 2019-12-17 NOTE — Telephone Encounter (Signed)
Steven Crane needs order for speech therapy to come out and do eval   Call back [phone] Elmyra Ricks)

## 2019-12-19 NOTE — Telephone Encounter (Signed)
Pls place referrs for speech therapy. Thx. Dr. Lovena Le

## 2019-12-20 ENCOUNTER — Emergency Department (HOSPITAL_COMMUNITY): Payer: BC Managed Care – PPO

## 2019-12-20 ENCOUNTER — Other Ambulatory Visit: Payer: Self-pay

## 2019-12-20 ENCOUNTER — Emergency Department (HOSPITAL_COMMUNITY)
Admission: EM | Admit: 2019-12-20 | Discharge: 2019-12-20 | Disposition: A | Discharge status: Home / Self Care | Payer: BC Managed Care – PPO | Attending: Emergency Medicine | Admitting: Emergency Medicine

## 2019-12-20 ENCOUNTER — Encounter (HOSPITAL_COMMUNITY): Payer: Self-pay | Admitting: Emergency Medicine

## 2019-12-20 DIAGNOSIS — Z79899 Other long term (current) drug therapy: Secondary | ICD-10-CM | POA: Diagnosis not present

## 2019-12-20 DIAGNOSIS — E119 Type 2 diabetes mellitus without complications: Secondary | ICD-10-CM | POA: Diagnosis not present

## 2019-12-20 DIAGNOSIS — Z794 Long term (current) use of insulin: Secondary | ICD-10-CM | POA: Diagnosis not present

## 2019-12-20 DIAGNOSIS — Z8501 Personal history of malignant neoplasm of esophagus: Secondary | ICD-10-CM | POA: Insufficient documentation

## 2019-12-20 DIAGNOSIS — K2289 Other specified disease of esophagus: Secondary | ICD-10-CM

## 2019-12-20 DIAGNOSIS — R1310 Dysphagia, unspecified: Secondary | ICD-10-CM | POA: Insufficient documentation

## 2019-12-20 DIAGNOSIS — Z951 Presence of aortocoronary bypass graft: Secondary | ICD-10-CM | POA: Insufficient documentation

## 2019-12-20 DIAGNOSIS — R0602 Shortness of breath: Secondary | ICD-10-CM | POA: Diagnosis not present

## 2019-12-20 DIAGNOSIS — I110 Hypertensive heart disease with heart failure: Secondary | ICD-10-CM | POA: Insufficient documentation

## 2019-12-20 DIAGNOSIS — J45909 Unspecified asthma, uncomplicated: Secondary | ICD-10-CM | POA: Diagnosis not present

## 2019-12-20 DIAGNOSIS — I2510 Atherosclerotic heart disease of native coronary artery without angina pectoris: Secondary | ICD-10-CM | POA: Insufficient documentation

## 2019-12-20 DIAGNOSIS — I509 Heart failure, unspecified: Secondary | ICD-10-CM | POA: Diagnosis not present

## 2019-12-20 DIAGNOSIS — R079 Chest pain, unspecified: Secondary | ICD-10-CM | POA: Insufficient documentation

## 2019-12-20 DIAGNOSIS — R05 Cough: Secondary | ICD-10-CM | POA: Insufficient documentation

## 2019-12-20 DIAGNOSIS — R066 Hiccough: Secondary | ICD-10-CM | POA: Diagnosis present

## 2019-12-20 LAB — TROPONIN I (HIGH SENSITIVITY)
Troponin I (High Sensitivity): 6 ng/L (ref <18)
Troponin I (High Sensitivity): 6 ng/L (ref <18)

## 2019-12-20 LAB — URINALYSIS, ROUTINE W REFLEX MICROSCOPIC
Bilirubin Urine: NEGATIVE
Glucose, UA: NEGATIVE mg/dL
Hgb urine dipstick: NEGATIVE
Ketones, ur: NEGATIVE mg/dL
Leukocytes,Ua: NEGATIVE
Nitrite: NEGATIVE
Protein, ur: NEGATIVE mg/dL
Specific Gravity, Urine: 1.02 (ref 1.005–1.030)
pH: 6 (ref 5.0–8.0)

## 2019-12-20 LAB — LIPASE, BLOOD: Lipase: 14 U/L (ref 11–51)

## 2019-12-20 LAB — COMPREHENSIVE METABOLIC PANEL
ALT: 51 U/L — ABNORMAL HIGH (ref 0–44)
AST: 23 U/L (ref 15–41)
Albumin: 2.7 g/dL — ABNORMAL LOW (ref 3.5–5.0)
Alkaline Phosphatase: 103 U/L (ref 38–126)
Anion gap: 10 (ref 5–15)
BUN: 18 mg/dL (ref 6–20)
CO2: 26 mmol/L (ref 22–32)
Calcium: 8.3 mg/dL — ABNORMAL LOW (ref 8.9–10.3)
Chloride: 100 mmol/L (ref 98–111)
Creatinine, Ser: 0.43 mg/dL — ABNORMAL LOW (ref 0.61–1.24)
GFR calc Af Amer: >60 mL/min (ref >60)
GFR calc non Af Amer: >60 mL/min (ref >60)
Glucose, Bld: 110 mg/dL — ABNORMAL HIGH (ref 70–99)
Potassium: 3.7 mmol/L (ref 3.5–5.1)
Sodium: 136 mmol/L (ref 135–145)
Total Bilirubin: 0.6 mg/dL (ref 0.3–1.2)
Total Protein: 6.6 g/dL (ref 6.5–8.1)

## 2019-12-20 LAB — CBC WITH DIFFERENTIAL/PLATELET
Abs Immature Granulocytes: 0.03 10*3/uL (ref 0.00–0.07)
Basophils Absolute: 0 10*3/uL (ref 0.0–0.1)
Basophils Relative: 0 %
Eosinophils Absolute: 0 10*3/uL (ref 0.0–0.5)
Eosinophils Relative: 0 %
HCT: 36.1 % — ABNORMAL LOW (ref 39.0–52.0)
Hemoglobin: 11.8 g/dL — ABNORMAL LOW (ref 13.0–17.0)
Immature Granulocytes: 0 %
Lymphocytes Relative: 3 %
Lymphs Abs: 0.3 10*3/uL — ABNORMAL LOW (ref 0.7–4.0)
MCH: 29.1 pg (ref 26.0–34.0)
MCHC: 32.7 g/dL (ref 30.0–36.0)
MCV: 88.9 fL (ref 80.0–100.0)
Monocytes Absolute: 0.9 10*3/uL (ref 0.1–1.0)
Monocytes Relative: 9 %
Neutro Abs: 9.1 10*3/uL — ABNORMAL HIGH (ref 1.7–7.7)
Neutrophils Relative %: 88 %
Platelets: 272 10*3/uL (ref 150–400)
RBC: 4.06 MIL/uL — ABNORMAL LOW (ref 4.22–5.81)
RDW: 14.3 % (ref 11.5–15.5)
WBC: 10.4 10*3/uL (ref 4.0–10.5)
nRBC: 0 % (ref 0.0–0.2)

## 2019-12-20 LAB — LACTIC ACID, PLASMA: Lactic Acid, Venous: 1 mmol/L (ref 0.5–1.9)

## 2019-12-20 MED ORDER — IOHEXOL 300 MG/ML  SOLN
100.0000 mL | Freq: Once | INTRAMUSCULAR | Status: AC | PRN
  Administered 2019-12-20: 100 mL via INTRAVENOUS

## 2019-12-20 MED ORDER — CHLORPROMAZINE HCL 50 MG PO TABS: 50.0000 mg | ORAL_TABLET | Freq: Three times a day (TID) | ORAL | 0 refills | Status: DC | PRN

## 2019-12-20 MED ORDER — LORAZEPAM 2 MG/ML IJ SOLN
1.0000 mg | Freq: Once | INTRAMUSCULAR | Status: AC
  Administered 2019-12-20: 1 mg via INTRAVENOUS
  Filled 2019-12-20: qty 1

## 2019-12-20 MED ORDER — IOHEXOL 9 MG/ML PO SOLN
ORAL | Status: AC
  Filled 2019-12-20: qty 500

## 2019-12-20 MED ORDER — SODIUM CHLORIDE 0.9 % IV SOLN
INTRAVENOUS | Status: AC
  Filled 2019-12-20: qty 1000

## 2019-12-20 MED ORDER — SODIUM CHLORIDE 0.9 % IV SOLN
12.5000 mg | Freq: Once | INTRAVENOUS | Status: AC
  Administered 2019-12-20: 12.5 mg via INTRAVENOUS
  Filled 2019-12-20: qty 0.5

## 2019-12-20 MED ORDER — DIATRIZOATE MEGLUMINE & SODIUM 66-10 % PO SOLN: 100.0000 mL | Freq: Once | ORAL | Status: DC

## 2019-12-20 NOTE — Telephone Encounter (Signed)
Verbal order for speech therapy given to [place] at [facility].

## 2019-12-20 NOTE — ED Triage Notes (Signed)
Pt states he has had hiccups since Friday and has had bile leaking from feeding tube "ever since they replaced it".

## 2019-12-20 NOTE — ED Provider Notes (Signed)
Blood pressure (!) 160/90, pulse 97, temperature 100.3 F (37.9 C), temperature source Oral, resp. rate 16, height 6\' 2"  (1.88 m), weight 98.4 kg, SpO2 96 %.  Assuming care from Dr. Betsey Holiday.  In short, Steven Crane is a 59 y.o. male with a chief complaint of Hiccups .  Refer to the original H&P for additional details.  The current plan of care is to f/u on CT scans and reassess.   CT scans reviewed and discussed with CT surgery on call. Dr. Kipp Brood in currently in the OR and expected out in approximately 1 hour. They will review scans and call back. Hiccups have returned. Will try ativan. Updated patient and wife regarding scan results and plan.   CT surgery reviewed CT imaging. They will call to schedule f/u with the patient. Plan for symptom mgmt at home and continue feeds per J tube. Discussed f/u plan with patient and wife at bedside along with ED return precautions.    Margette Fast, MD 12/21/19 1200

## 2019-12-20 NOTE — ED Provider Notes (Signed)
[facility] Provider Note   CSN: [id number] Arrival date & time: 12/20/19  0320     History Chief Complaint  Patient presents with  . Hiccups    Steven Crane is a 59 y.o. male.  Patient presents to the emergency department with multiple complaints.  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  He had an esophagogastrectomy with postoperative complications of a small anastomotic leak.  Patient has a J-tube in place and had been n.p.o. for a period of time.  He had EGD performed earlier this week by his cardiothoracic surgeon and the leak was found to be healed.  He had a stent placed in the esophagus to help symptomatically with his difficulty swallowing and was told he can start taking oral liquids.  Patient reports that after the surgery he tried to drink something and he immediately had chest pain.  He has not tried to drink anything since.  He has been intermittently coughing up clear sputum and experiencing chest pain when this occurs.  He also has felt short of breath.  Patient reports that he had a blocked J-tube that was replaced 2 weeks ago.  Since the J-tube was replaced he has noticed yellowish drainage from around the tube fairly consistently.  The tube is not blocked, however, he is able to use it for tube feeds.  Patient reports that the original J-tube was sutured in place, but since the replacement was put it was not sutured.  He has noticed that the tube slides in and out of the stoma and the stoma area is red and somewhat swollen.        Past Medical History:  Diagnosis Date  . Anxiety   . Arthritis    "hands, back, knees" (08/27/2013)  . Asthma   . CHF (congestive heart failure) ([place]) 02/2010  . Chronic bronchitis ([place])    "get it q year"  . Chronic lower back pain   . Closed head injury 1975  . Coronary artery disease May 2015   s/p successful PTCA/DES x 1 to distal RCA and PTCA/DES x 1 to OM-20 Aug 2013  . Depression   . DVT (deep venous thrombosis) ([place]) 2008   "LLE"  . Esophageal cancer ([place])   . GERD (gastroesophageal reflux disease)   . Headache    hs of but no longer has problems   . History of kidney stones   . Hyperlipemia   . Hypertension   . Hypertriglyceridemia   . LV dysfunction    LVEF 40% at time of cardiac cath May 2015  . Morbid obesity ([place])   . Myocardial infarction ([place]) 02/2010  . Psoriasis   . Type II diabetes mellitus [facility])     Patient Active Problem List   Diagnosis Date Noted  . Pressure injury of skin 11/16/2019  . Esophageal perforation 11/14/2019  . 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 Protein-calorie malnutrition, severe 10/20/2019  . Esophageal cancer ([place]) 10/18/2019  . Failure to thrive (0-17) 09/21/2019  . Neutropenia, drug-induced ([place]) 08/23/2019  . 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 GERD (gastroesophageal reflux disease)   . Esophageal dysphagia   . Displaced oblique fracture of shaft of left tibia, initial encounter for closed fracture 08/21/2018  . Displaced comminuted fracture of shaft of left tibia, initial encounter for closed fracture 08/21/2018  . Unstable angina ([place]) 08/27/2013  . CAD S/P percutaneous coronary  angioplasty 2011 08/06/2013  . Chest pain 08/06/2013  . Kidney stone 04/05/2013  . Reactive airways dysfunction syndrome ([place]) 11/30/2012  . Hyperlipemia 09/25/2012  . Diabetes mellitus without complication ([place]) 68/02/5725  . Atherosclerotic heart disease 09/25/2012  . Essential hypertension, benign 09/25/2012  . Morbid obesity ([place]) 09/25/2012  . Psoriasis 09/25/2012  . History of DVT (deep vein thrombosis) 09/25/2012    Past Surgical History:  Procedure Laterality Date  . BIOPSY  06/17/2019   Procedure: BIOPSY;  Surgeon: [name], MD;  [facility];  Service: Endoscopy;;  . COLONOSCOPY  01/06/2012   Dr.  [name]: suboptimal prep. normal exam. next colonoscopy in 10 years.   . CORONARY ANGIOPLASTY WITH STENT PLACEMENT  02/2010; 08/27/2013   "1 + 3"   . ESOPHAGEAL STENT PLACEMENT N/A 12/14/2019   Procedure: ESOPHAGEAL STENT PLACEMENT;  Surgeon: Lajuana Matte, MD;  Location: [place];  Service: Thoracic;  Laterality: N/A;  . ESOPHAGOGASTRODUODENOSCOPY N/A 10/18/2019   Procedure: ESOPHAGOGASTRODUODENOSCOPY (EGD);  Surgeon: Lajuana Matte, MD;  Location: [facility] OR;  Service: Thoracic;  Laterality: N/A;  . ESOPHAGOGASTRODUODENOSCOPY N/A 12/14/2019   Procedure: ESOPHAGOGASTRODUODENOSCOPY (EGD);  Surgeon: Lajuana Matte, MD;  Location: [facility] OR;  Service: Thoracic;  Laterality: N/A;  . ESOPHAGOGASTRODUODENOSCOPY (EGD) WITH PROPOFOL N/A 06/17/2019   Procedure: ESOPHAGOGASTRODUODENOSCOPY (EGD) WITH PROPOFOL;  Surgeon: [name], MD;  [facility];  Service: Endoscopy;  Laterality: N/A;  11:15am  . FRACTURE SURGERY    . INTERCOSTAL NERVE BLOCK  10/18/2019   Procedure: INTERCOSTAL NERVE BLOCK;  Surgeon: Lajuana Matte, MD;  Location: [place];  Service: Thoracic;;  . IR CM INJ ANY COLONIC TUBE W/FLUORO  11/17/2019  . IR IMAGING GUIDED PORT INSERTION  08/03/2019  . IR REPLC DUODEN/JEJUNO TUBE PERCUT W/FLUORO  12/04/2019  . KNEE ARTHROSCOPY Left 1981  . LEFT HEART CATHETERIZATION WITH CORONARY ANGIOGRAM N/A 08/27/2013   Procedure: LEFT HEART CATHETERIZATION WITH CORONARY ANGIOGRAM;  Surgeon: [name], MD;  Location: [facility] CATH LAB;  Service: Cardiovascular;  Laterality: N/A;  . NODE DISSECTION  10/18/2019   Procedure: NODE DISSECTION;  Surgeon: Lajuana Matte, MD;  Location: [place];  Service: Thoracic;;  . right wrist fracture surgery     . TIBIA IM NAIL INSERTION Left 08/21/2018   Procedure: INTRAMEDULLARY (IM) NAIL TIBIAL;  Surgeon: Rod Can, MD;  Location: [facility];  Service: Orthopedics;  Laterality: Left;       Family History  Problem Relation Age of Onset  .  Coronary artery disease Father   . Diabetes type II Father   . Diabetes Father   . Heart attack Father   . Hypertension Mother     Social History   Tobacco Use  . Smoking status: Never Smoker  . Smokeless tobacco: Former Systems developer    Types: Snuff, Chew  . Tobacco comment: "started dipping @ age 53; quit for 5 1/2 yrs at one time; hadn't chewed in awhile"  Vaping Use  . Vaping Use: Never used  Substance Use Topics  . Alcohol use: Not Currently    Alcohol/week: 0.0 standard drinks    Comment: occ   . Drug use: No    Home Medications Prior to Admission medications   Medication Sig Start Date End Date Taking? Authorizing Provider  acetaminophen (TYLENOL) 160 MG/5ML solution Place 10.2-20.3 mLs (325-650 mg total) into feeding tube every 4 (four) hours as needed for mild pain or moderate pain. 11/23/19   Elgie Collard, PA-C  blood glucose meter kit and supplies Test glucose once daily. ICD 10 E11.9 09/26/17   [name], MD  insulin aspart (NOVOLOG) 100 UNIT/ML FlexPen CBG < 70: Implement Hypoglycemia Standing Orders and refer to Hypoglycemia Standing Orders sidebar report  CBG < 120 (dose in units): 0  CBG 120 - 160 (dose in units): 2  CBG 161 - 200 (dose in units): 4  CBG 201 - 250 (dose in units): 8  CBG 251 - 300 (dose in units): 12  CBG 301 - 350 (dose in units) 16  CBG 351 - 450 (dose in units): 20  CBG > 450 24 units and obtain STAT glucose and notify MD  *this will be a short term medication that will be susceptible to changes when your tube feedings are changes 11/23/19   Elgie Collard, PA-C  insulin aspart (NOVOLOG) 100 UNIT/ML FlexPen Inject 3 Units into the skin 3 (three) times daily with meals. 12/13/19   Lovena Le, Malena M, DO  insulin detemir (LEVEMIR) 100 UNIT/ML injection Inject 0.2 mLs (20 Units total) into the skin daily. Patient taking differently: Inject 25 Units into the skin daily.  11/24/19   Elgie Collard, PA-C  Insulin Pen Needle (PEN NEEDLES 3/16") 31G X 5 MM  MISC Inject 3 Units as directed 6 (six) times daily. 12/15/19   Elvia Collum M, DO  metoprolol tartrate (LOPRESSOR) 25 MG tablet Place 1 tablet (25 mg total) into feeding tube 2 (two) times daily. Crush finely and insert in Jtube 11/23/19   Harriet Pho, Tessa N, PA-C  pantoprazole (PROTONIX) 40 MG tablet SMARTSIG:2 Tablet(s) By Mouth 10 Times Daily PRN Patient not taking: Reported on 12/13/2019 12/01/19   [provider]  pantoprazole sodium (PROTONIX) 40 mg/20 mL PACK 81m./ml, 20 ml via J-Tube daily Patient taking differently: Place 40 mg into feeding tube every evening.  11/30/19   LLajuana Matte MD    Allergies    Adhesive [tape]  Review of Systems   Review of Systems  Respiratory: Positive for cough and shortness of breath.   Cardiovascular: Positive for chest pain.  All other systems reviewed and are negative.   Physical Exam Updated Vital Signs BP (!) 160/90 (BP Location: Right Arm)   Pulse 97   Temp 100.3 F (37.9 C) (Oral)   Resp 16   Ht '6\' 2"'  (1.88 m)   Wt 98.4 kg   SpO2 96%   BMI 27.85 kg/m   Physical Exam Vitals and nursing note reviewed.  Constitutional:      Appearance: Normal appearance. He is well-developed. He is ill-appearing. He is not toxic-appearing.  HENT:     Head: Normocephalic and atraumatic.     Right Ear: Hearing normal.     Left Ear: Hearing normal.     Nose: Nose normal.  Eyes:     Conjunctiva/sclera: Conjunctivae normal.     Pupils: Pupils are equal, round, and reactive to light.  Cardiovascular:     Rate and Rhythm: Regular rhythm.     Heart sounds: S1 normal and S2 normal. No murmur heard.  No friction rub. No gallop.   Pulmonary:     Effort: Pulmonary effort is normal. No respiratory distress.     Breath sounds: Normal breath sounds.  Chest:     Chest wall: No tenderness.  Abdominal:     General: Bowel sounds are normal.     Palpations: Abdomen is soft.     Tenderness: There is no abdominal tenderness. There is no  guarding  or rebound. Negative signs include Murphy's sign and McBurney's sign.     Hernia: No hernia is present.  Musculoskeletal:        General: Normal range of motion.     Cervical back: Normal range of motion and neck supple.  Skin:    General: Skin is warm and dry.     Findings: No rash.  Neurological:     Mental Status: He is alert and oriented to person, place, and time.     GCS: GCS eye subscore is 4. GCS verbal subscore is 5. GCS motor subscore is 6.     Cranial Nerves: No cranial nerve deficit.     Sensory: No sensory deficit.     Coordination: Coordination normal.  Psychiatric:        Speech: Speech normal.        Behavior: Behavior normal.        Thought Content: Thought content normal.     ED Results / Procedures / Treatments   Labs (all labs ordered are listed, but only abnormal results are displayed) Labs Reviewed  CBC WITH DIFFERENTIAL/PLATELET - Abnormal; Notable for the following components:      Result Value   RBC 4.06 (*)    Hemoglobin 11.8 (*)    HCT 36.1 (*)    Neutro Abs 9.1 (*)    Lymphs Abs 0.3 (*)    All other components within normal limits  COMPREHENSIVE METABOLIC PANEL - Abnormal; Notable for the following components:   Glucose, Bld 110 (*)    Creatinine, Ser 0.43 (*)    Calcium 8.3 (*)    Albumin 2.7 (*)    ALT 51 (*)    All other components within normal limits  URINE CULTURE  CULTURE, BLOOD (ROUTINE X 2)  CULTURE, BLOOD (ROUTINE X 2)  LIPASE, BLOOD  LACTIC ACID, PLASMA  URINALYSIS, ROUTINE W REFLEX MICROSCOPIC  TROPONIN I (HIGH SENSITIVITY)    EKG None  Radiology DG Chest Port 1 View  Result Date: 12/20/2019 CLINICAL DATA:  Chest pain and hiccups. EXAM: PORTABLE CHEST 1 VIEW COMPARISON:  12/16/2019 FINDINGS: The lungs are clear without focal pneumonia, edema, pneumothorax or pleural effusion. The cardiopericardial silhouette is within normal limits for size. Esophageal stent device noted. Right Port-A-Cath remains in place. The visualized  bony structures of the thorax show now acute abnormality. IMPRESSION: No acute cardiopulmonary findings. Electronically Signed   By: Misty Stanley M.D.   On: 12/20/2019 06:45    Procedures Procedures (including critical care time)  Medications Ordered in ED Medications  chlorproMAZINE (THORAZINE) 12.5 mg in sodium chloride 0.9 % 25 mL IVPB (has no administration in time range)  diatrizoate meglumine-sodium (GASTROGRAFIN) 66-10 % solution 100 mL (has no administration in time range)    ED Course  I have reviewed the triage vital signs and the nursing notes.  Pertinent labs & imaging results that were available during my care of the patient were reviewed by me and considered in my medical decision making (see chart for details).    MDM Rules/Calculators/A&P                          Patient presents to the emergency department with multiple complaints. 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 Patient had postsurgical complications of anastomosis leak. Patient has had EGD earlier this week that showed healing of the anastomosis and no further leak.  He tried to drink and had significant pain, has not drank anything since.  Patient seems to be having difficulty swallowing his saliva, spitting up clear foamy liquid back up into a bottle. He has a low-grade fever of unclear etiology. Chest x-ray is negative. Will require additional imaging to further evaluate esophagus. Patient will have CT with Gastrografin swallow to further evaluate for possible obstruction or leak from previous surgical site. Will have CT followed down through the abdomen because of the problems with J-tube. Will sign out to oncoming ER physician.  Final Clinical Impression(s) / ED Diagnoses Final diagnoses:  Dysphagia, unspecified type    Rx / DC Orders ED Discharge Orders    None       Lonetta Blassingame, Gwenyth Allegra, MD 12/20/19 0730

## 2019-12-20 NOTE — Discharge Instructions (Signed)
You were seen in the emergency room today with difficulty swallowing and painful swallowing along with hiccups.  I have called in a medication which you can administer through your J-tube to take as needed for hiccups.  Your CT scan today did not show any leaking or other severe or emergent finding.  I have discussed this with your cardiothoracic surgeon and they will be seeing in the office in the next 2 weeks.  Please call your primary care doctor for close follow-up and return to the emergency department any new or suddenly worsening symptoms.  Until your symptoms improve please return to using the J-tube for your feeds and other intake.

## 2019-12-21 ENCOUNTER — Telehealth: Payer: Self-pay | Admitting: Family Medicine

## 2019-12-21 LAB — URINE CULTURE: Culture: <10000 — AB

## 2019-12-21 NOTE — Telephone Encounter (Signed)
Is he still having hiccups?  Would call GI to see what they recommend for the hiccups if they are constant. Bc of interaction with thorazine with is other meds.   Would not take the thorazine w/o calling GI.   Thx.   Dr. Lovena Le

## 2019-12-21 NOTE — Telephone Encounter (Signed)
Steven Crane at [facility] calling because patient went to the ER yesterday for hiccups and chest pain.  They prescribed Thorazine 50mg . tid Per nurse, when she puts this medicine in her system she gets an interaction with zofran and oxycodeine.  [phone]

## 2019-12-22 ENCOUNTER — Telehealth (HOSPITAL_COMMUNITY): Payer: Self-pay | Admitting: *Deleted

## 2019-12-22 ENCOUNTER — Telehealth: Payer: Self-pay | Admitting: Family Medicine

## 2019-12-22 ENCOUNTER — Other Ambulatory Visit (HOSPITAL_COMMUNITY): Payer: Self-pay | Admitting: *Deleted

## 2019-12-22 MED ORDER — BACLOFEN 10 MG PO TABS: 10.0000 mg | ORAL_TABLET | Freq: Three times a day (TID) | ORAL | 0 refills | Status: AC

## 2019-12-22 NOTE — Telephone Encounter (Signed)
Pt could try baclofen for his hiccups to help relax the diaphragm.  Its 3x per day as needed.  Does he want to try this? Doesn't interact with zofran.  Thx, dr. Lovena Le

## 2019-12-22 NOTE — Progress Notes (Signed)
I spoke with Dr. Delton Coombes and he advised that the pt take the thorazine 25mg  every 6 hours until the hiccups stop. The pt stated that his PCP stated that she would not recommend that for her pts. I advised the pt to call his PCP back and see what other options she recommended. He verbalized understanding.

## 2019-12-22 NOTE — Telephone Encounter (Signed)
Patient stated he is still having hiccups but has not taken any of the medication yet  and stated he will contact his cancer specialist to get their opinion on the hiccups and medication(he states he doesn't have a regular GI doctor)

## 2019-12-22 NOTE — Telephone Encounter (Signed)
Patient originally declined medication but called back and stated he has had the hiccups for quite some time this afternoon and would like to try the medication Dr Lovena Le recommended.

## 2019-12-22 NOTE — Telephone Encounter (Signed)
Patient notified

## 2019-12-22 NOTE — Telephone Encounter (Deleted)
Patient notified

## 2019-12-22 NOTE — Telephone Encounter (Signed)
Patient declined medication and states he has left a message for oncology to see what they recommend

## 2019-12-22 NOTE — Telephone Encounter (Signed)
Patient calling back.  Very hard to understand him due to hiccups but I think he is saying he wants to try the meds Dr. Lovena Le is suggesting.  [place]

## 2019-12-24 ENCOUNTER — Other Ambulatory Visit: Payer: Self-pay | Admitting: Surgical

## 2019-12-24 ENCOUNTER — Encounter (HOSPITAL_COMMUNITY): Payer: Self-pay | Admitting: Thoracic Surgery (Cardiothoracic Vascular Surgery)

## 2019-12-24 ENCOUNTER — Inpatient Hospital Stay (HOSPITAL_COMMUNITY): Payer: BC Managed Care – PPO

## 2019-12-24 ENCOUNTER — Ambulatory Visit (INDEPENDENT_AMBULATORY_CARE_PROVIDER_SITE_OTHER): Payer: Self-pay | Admitting: Thoracic Surgery (Cardiothoracic Vascular Surgery)

## 2019-12-24 ENCOUNTER — Other Ambulatory Visit: Payer: Self-pay

## 2019-12-24 ENCOUNTER — Inpatient Hospital Stay (HOSPITAL_COMMUNITY)
Admission: AD | Admit: 2019-12-24 | Discharge: 2020-01-21 | DRG: 640 | Disposition: E | Payer: BC Managed Care – PPO | Attending: Thoracic Surgery (Cardiothoracic Vascular Surgery) | Admitting: Thoracic Surgery (Cardiothoracic Vascular Surgery)

## 2019-12-24 ENCOUNTER — Ambulatory Visit (INDEPENDENT_AMBULATORY_CARE_PROVIDER_SITE_OTHER): Payer: BC Managed Care – PPO | Admitting: Student

## 2019-12-24 ENCOUNTER — Inpatient Hospital Stay (HOSPITAL_COMMUNITY): Payer: BC Managed Care – PPO | Attending: Hematology

## 2019-12-24 ENCOUNTER — Encounter: Payer: Self-pay | Admitting: Student

## 2019-12-24 ENCOUNTER — Encounter: Payer: Self-pay | Admitting: Thoracic Surgery (Cardiothoracic Vascular Surgery)

## 2019-12-24 VITALS — BP 140/68 | HR 80 | Ht 74.0 in | Wt 212.8 lb

## 2019-12-24 VITALS — BP 133/79 | HR 80 | Temp 97.8°F | Resp 20 | Ht 74.0 in | Wt 212.0 lb

## 2019-12-24 DIAGNOSIS — E119 Type 2 diabetes mellitus without complications: Secondary | ICD-10-CM | POA: Diagnosis present

## 2019-12-24 DIAGNOSIS — C159 Malignant neoplasm of esophagus, unspecified: Secondary | ICD-10-CM

## 2019-12-24 DIAGNOSIS — L409 Psoriasis, unspecified: Secondary | ICD-10-CM | POA: Diagnosis present

## 2019-12-24 DIAGNOSIS — I2510 Atherosclerotic heart disease of native coronary artery without angina pectoris: Secondary | ICD-10-CM | POA: Diagnosis present

## 2019-12-24 DIAGNOSIS — R627 Adult failure to thrive: Principal | ICD-10-CM | POA: Diagnosis present

## 2019-12-24 DIAGNOSIS — I5022 Chronic systolic (congestive) heart failure: Secondary | ICD-10-CM | POA: Diagnosis not present

## 2019-12-24 DIAGNOSIS — Z833 Family history of diabetes mellitus: Secondary | ICD-10-CM | POA: Diagnosis not present

## 2019-12-24 DIAGNOSIS — Z91048 Other nonmedicinal substance allergy status: Secondary | ICD-10-CM

## 2019-12-24 DIAGNOSIS — A419 Sepsis, unspecified organism: Secondary | ICD-10-CM | POA: Diagnosis not present

## 2019-12-24 DIAGNOSIS — Z87442 Personal history of urinary calculi: Secondary | ICD-10-CM | POA: Diagnosis not present

## 2019-12-24 DIAGNOSIS — J45909 Unspecified asthma, uncomplicated: Secondary | ICD-10-CM | POA: Diagnosis present

## 2019-12-24 DIAGNOSIS — C160 Malignant neoplasm of cardia: Secondary | ICD-10-CM | POA: Diagnosis not present

## 2019-12-24 DIAGNOSIS — E871 Hypo-osmolality and hyponatremia: Secondary | ICD-10-CM | POA: Diagnosis not present

## 2019-12-24 DIAGNOSIS — I480 Paroxysmal atrial fibrillation: Secondary | ICD-10-CM

## 2019-12-24 DIAGNOSIS — Z789 Other specified health status: Secondary | ICD-10-CM

## 2019-12-24 DIAGNOSIS — K219 Gastro-esophageal reflux disease without esophagitis: Secondary | ICD-10-CM | POA: Diagnosis present

## 2019-12-24 DIAGNOSIS — I10 Essential (primary) hypertension: Secondary | ICD-10-CM

## 2019-12-24 DIAGNOSIS — Z9889 Other specified postprocedural states: Secondary | ICD-10-CM

## 2019-12-24 DIAGNOSIS — Z6828 Body mass index (BMI) 28.0-28.9, adult: Secondary | ICD-10-CM | POA: Diagnosis not present

## 2019-12-24 DIAGNOSIS — R42 Dizziness and giddiness: Secondary | ICD-10-CM | POA: Diagnosis not present

## 2019-12-24 DIAGNOSIS — Z86718 Personal history of other venous thrombosis and embolism: Secondary | ICD-10-CM | POA: Diagnosis not present

## 2019-12-24 DIAGNOSIS — J189 Pneumonia, unspecified organism: Secondary | ICD-10-CM | POA: Diagnosis not present

## 2019-12-24 DIAGNOSIS — J690 Pneumonitis due to inhalation of food and vomit: Secondary | ICD-10-CM | POA: Diagnosis not present

## 2019-12-24 DIAGNOSIS — K223 Perforation of esophagus: Secondary | ICD-10-CM

## 2019-12-24 DIAGNOSIS — J9601 Acute respiratory failure with hypoxia: Secondary | ICD-10-CM | POA: Diagnosis not present

## 2019-12-24 DIAGNOSIS — Z20822 Contact with and (suspected) exposure to covid-19: Secondary | ICD-10-CM | POA: Diagnosis present

## 2019-12-24 DIAGNOSIS — Z955 Presence of coronary angioplasty implant and graft: Secondary | ICD-10-CM

## 2019-12-24 DIAGNOSIS — R1310 Dysphagia, unspecified: Secondary | ICD-10-CM | POA: Diagnosis present

## 2019-12-24 DIAGNOSIS — E669 Obesity, unspecified: Secondary | ICD-10-CM | POA: Diagnosis present

## 2019-12-24 DIAGNOSIS — R001 Bradycardia, unspecified: Secondary | ICD-10-CM | POA: Diagnosis not present

## 2019-12-24 DIAGNOSIS — R066 Hiccough: Secondary | ICD-10-CM | POA: Diagnosis present

## 2019-12-24 DIAGNOSIS — E781 Pure hyperglyceridemia: Secondary | ICD-10-CM | POA: Diagnosis present

## 2019-12-24 DIAGNOSIS — Z09 Encounter for follow-up examination after completed treatment for conditions other than malignant neoplasm: Secondary | ICD-10-CM

## 2019-12-24 DIAGNOSIS — G479 Sleep disorder, unspecified: Secondary | ICD-10-CM | POA: Diagnosis present

## 2019-12-24 DIAGNOSIS — E785 Hyperlipidemia, unspecified: Secondary | ICD-10-CM | POA: Diagnosis present

## 2019-12-24 DIAGNOSIS — I110 Hypertensive heart disease with heart failure: Secondary | ICD-10-CM | POA: Diagnosis present

## 2019-12-24 DIAGNOSIS — I252 Old myocardial infarction: Secondary | ICD-10-CM

## 2019-12-24 DIAGNOSIS — M1990 Unspecified osteoarthritis, unspecified site: Secondary | ICD-10-CM | POA: Diagnosis present

## 2019-12-24 DIAGNOSIS — I4891 Unspecified atrial fibrillation: Secondary | ICD-10-CM | POA: Diagnosis not present

## 2019-12-24 DIAGNOSIS — I469 Cardiac arrest, cause unspecified: Secondary | ICD-10-CM | POA: Diagnosis not present

## 2019-12-24 DIAGNOSIS — Z8249 Family history of ischemic heart disease and other diseases of the circulatory system: Secondary | ICD-10-CM

## 2019-12-24 DIAGNOSIS — I9581 Postprocedural hypotension: Secondary | ICD-10-CM | POA: Diagnosis not present

## 2019-12-24 DIAGNOSIS — E43 Unspecified severe protein-calorie malnutrition: Secondary | ICD-10-CM | POA: Diagnosis present

## 2019-12-24 DIAGNOSIS — I4892 Unspecified atrial flutter: Secondary | ICD-10-CM | POA: Diagnosis not present

## 2019-12-24 DIAGNOSIS — Z87891 Personal history of nicotine dependence: Secondary | ICD-10-CM

## 2019-12-24 DIAGNOSIS — F329 Major depressive disorder, single episode, unspecified: Secondary | ICD-10-CM | POA: Diagnosis present

## 2019-12-24 DIAGNOSIS — R0902 Hypoxemia: Secondary | ICD-10-CM

## 2019-12-24 DIAGNOSIS — E873 Alkalosis: Secondary | ICD-10-CM | POA: Diagnosis not present

## 2019-12-24 DIAGNOSIS — K9189 Other postprocedural complications and disorders of digestive system: Secondary | ICD-10-CM

## 2019-12-24 DIAGNOSIS — F419 Anxiety disorder, unspecified: Secondary | ICD-10-CM | POA: Diagnosis present

## 2019-12-24 DIAGNOSIS — Z794 Long term (current) use of insulin: Secondary | ICD-10-CM

## 2019-12-24 DIAGNOSIS — R0602 Shortness of breath: Secondary | ICD-10-CM | POA: Diagnosis not present

## 2019-12-24 DIAGNOSIS — Z9049 Acquired absence of other specified parts of digestive tract: Secondary | ICD-10-CM

## 2019-12-24 DIAGNOSIS — J95812 Postprocedural air leak: Secondary | ICD-10-CM | POA: Diagnosis not present

## 2019-12-24 DIAGNOSIS — R079 Chest pain, unspecified: Secondary | ICD-10-CM

## 2019-12-24 DIAGNOSIS — D630 Anemia in neoplastic disease: Secondary | ICD-10-CM | POA: Diagnosis present

## 2019-12-24 DIAGNOSIS — Z931 Gastrostomy status: Secondary | ICD-10-CM

## 2019-12-24 LAB — CBC WITH DIFFERENTIAL/PLATELET
Abs Immature Granulocytes: 0.07 10*3/uL (ref 0.00–0.07)
Basophils Absolute: 0 10*3/uL (ref 0.0–0.1)
Basophils Relative: 0 %
Eosinophils Absolute: 0 10*3/uL (ref 0.0–0.5)
Eosinophils Relative: 0 %
HCT: 32.5 % — ABNORMAL LOW (ref 39.0–52.0)
Hemoglobin: 10.5 g/dL — ABNORMAL LOW (ref 13.0–17.0)
Immature Granulocytes: 1 %
Lymphocytes Relative: 5 %
Lymphs Abs: 0.5 10*3/uL — ABNORMAL LOW (ref 0.7–4.0)
MCH: 28.8 pg (ref 26.0–34.0)
MCHC: 32.3 g/dL (ref 30.0–36.0)
MCV: 89.3 fL (ref 80.0–100.0)
Monocytes Absolute: 0.6 10*3/uL (ref 0.1–1.0)
Monocytes Relative: 6 %
Neutro Abs: 10.1 10*3/uL — ABNORMAL HIGH (ref 1.7–7.7)
Neutrophils Relative %: 88 %
Platelets: 293 10*3/uL (ref 150–400)
RBC: 3.64 MIL/uL — ABNORMAL LOW (ref 4.22–5.81)
RDW: 14.4 % (ref 11.5–15.5)
WBC: 11.4 10*3/uL — ABNORMAL HIGH (ref 4.0–10.5)
nRBC: 0 % (ref 0.0–0.2)

## 2019-12-24 LAB — COMPREHENSIVE METABOLIC PANEL
ALT: 26 U/L (ref 0–44)
AST: 19 U/L (ref 15–41)
Albumin: 1.9 g/dL — ABNORMAL LOW (ref 3.5–5.0)
Alkaline Phosphatase: 94 U/L (ref 38–126)
Anion gap: 11 (ref 5–15)
BUN: 13 mg/dL (ref 6–20)
CO2: 27 mmol/L (ref 22–32)
Calcium: 8.3 mg/dL — ABNORMAL LOW (ref 8.9–10.3)
Chloride: 98 mmol/L (ref 98–111)
Creatinine, Ser: 0.5 mg/dL — ABNORMAL LOW (ref 0.61–1.24)
GFR calc Af Amer: >60 mL/min (ref >60)
GFR calc non Af Amer: >60 mL/min (ref >60)
Glucose, Bld: 104 mg/dL — ABNORMAL HIGH (ref 70–99)
Potassium: 3.1 mmol/L — ABNORMAL LOW (ref 3.5–5.1)
Sodium: 136 mmol/L (ref 135–145)
Total Bilirubin: 0.6 mg/dL (ref 0.3–1.2)
Total Protein: 5.8 g/dL — ABNORMAL LOW (ref 6.5–8.1)

## 2019-12-24 LAB — GLUCOSE, CAPILLARY
Glucose-Capillary: 60 mg/dL — ABNORMAL LOW (ref 70–99)
Glucose-Capillary: 83 mg/dL (ref 70–99)
Glucose-Capillary: 99 mg/dL (ref 70–99)

## 2019-12-24 LAB — SARS CORONAVIRUS 2 BY RT PCR (HOSPITAL ORDER, PERFORMED IN ~~LOC~~ HOSPITAL LAB): SARS Coronavirus 2: NEGATIVE

## 2019-12-24 LAB — HEMOGLOBIN A1C
Hgb A1c MFr Bld: 7.2 % — ABNORMAL HIGH (ref 4.8–5.6)
Mean Plasma Glucose: 159.94 mg/dL

## 2019-12-24 LAB — MRSA PCR SCREENING: MRSA by PCR: NEGATIVE

## 2019-12-24 MED ORDER — SODIUM CHLORIDE 0.9% FLUSH
3.0000 mL | Freq: Two times a day (BID) | INTRAVENOUS | Status: DC
  Administered 2019-12-24 – 2020-01-06 (×19): 3 mL via INTRAVENOUS

## 2019-12-24 MED ORDER — MORPHINE SULFATE (PF) 2 MG/ML IV SOLN
2.0000 mg | INTRAVENOUS | Status: DC | PRN
  Administered 2019-12-26 – 2019-12-30 (×5): 2 mg via INTRAVENOUS
  Filled 2019-12-24 (×5): qty 1

## 2019-12-24 MED ORDER — ENOXAPARIN SODIUM 40 MG/0.4ML ~~LOC~~ SOLN
40.0000 mg | SUBCUTANEOUS | Status: DC
  Administered 2019-12-24 – 2020-01-06 (×13): 40 mg via SUBCUTANEOUS
  Filled 2019-12-24 (×14): qty 0.4

## 2019-12-24 MED ORDER — CHLORHEXIDINE GLUCONATE CLOTH 2 % EX PADS
6.0000 | MEDICATED_PAD | Freq: Every day | CUTANEOUS | Status: DC
  Administered 2019-12-24 – 2020-01-06 (×9): 6 via TOPICAL

## 2019-12-24 MED ORDER — CHLORPROMAZINE HCL 25 MG PO TABS
25.0000 mg | ORAL_TABLET | Freq: Three times a day (TID) | ORAL | Status: DC | PRN
  Filled 2019-12-24: qty 1

## 2019-12-24 MED ORDER — INSULIN ASPART 100 UNIT/ML ~~LOC~~ SOLN
3.0000 [IU] | Freq: Three times a day (TID) | SUBCUTANEOUS | Status: DC
  Administered 2019-12-25 – 2019-12-26 (×4): 3 [IU] via SUBCUTANEOUS

## 2019-12-24 MED ORDER — SODIUM CHLORIDE 0.9% FLUSH: 3.0000 mL | Freq: Two times a day (BID) | INTRAVENOUS | Status: DC

## 2019-12-24 MED ORDER — PANTOPRAZOLE SODIUM 40 MG PO PACK
40.0000 mg | PACK | Freq: Every day | ORAL | Status: DC
  Administered 2019-12-25 – 2019-12-30 (×5): 40 mg
  Filled 2019-12-24 (×5): qty 20

## 2019-12-24 MED ORDER — SODIUM CHLORIDE 0.9 % IV SOLN: 250.0000 mL | INTRAVENOUS | Status: DC | PRN

## 2019-12-24 MED ORDER — SODIUM CHLORIDE 0.9% FLUSH
10.0000 mL | Freq: Two times a day (BID) | INTRAVENOUS | Status: DC
  Administered 2019-12-24 – 2020-01-05 (×20): 10 mL
  Administered 2020-01-06: 20 mL
  Administered 2020-01-06: 10 mL

## 2019-12-24 MED ORDER — INSULIN ASPART 100 UNIT/ML ~~LOC~~ SOLN
0.0000 [IU] | Freq: Three times a day (TID) | SUBCUTANEOUS | Status: DC
  Administered 2019-12-25: 3 [IU] via SUBCUTANEOUS
  Administered 2019-12-25: 8 [IU] via SUBCUTANEOUS
  Administered 2019-12-25: 2 [IU] via SUBCUTANEOUS

## 2019-12-24 MED ORDER — SODIUM CHLORIDE 0.9% FLUSH: 10.0000 mL | INTRAVENOUS | Status: DC | PRN

## 2019-12-24 MED ORDER — ONDANSETRON HCL 4 MG/2ML IJ SOLN: 4.0000 mg | Freq: Four times a day (QID) | INTRAMUSCULAR | Status: DC | PRN

## 2019-12-24 MED ORDER — ACETAMINOPHEN 160 MG/5ML PO SOLN
650.0000 mg | Freq: Four times a day (QID) | ORAL | Status: DC | PRN
  Administered 2019-12-25 – 2020-01-06 (×24): 650 mg
  Filled 2019-12-24 (×24): qty 20.3

## 2019-12-24 MED ORDER — OSMOLITE 1.2 CAL PO LIQD
1000.0000 mL | ORAL | Status: DC
  Administered 2019-12-24: 1000 mL
  Filled 2019-12-24: qty 1000

## 2019-12-24 MED ORDER — SODIUM CHLORIDE 0.9% FLUSH: 3.0000 mL | INTRAVENOUS | Status: DC | PRN

## 2019-12-24 NOTE — Progress Notes (Signed)
     [address]       [place] [address]             [phone]      Mr. Okubo comes in in follow-up.  He has had intractable hiccups in the last 2 weeks.  He has tolerated minimal p.o. intake and has had some drainage from around his J-tube site.  He is extraordinarily weak, and failing at home.  We will admit him to the hospital for stent removal and approved nutrition.  Aquilla Voiles Bary Leriche

## 2019-12-24 NOTE — Progress Notes (Signed)
Cardiology Office Note    Date:  12/25/2019   ID:  Steven Crane, DOB [birth year], MRN [id number]  PCP:  Erven Colla, DO  Cardiologist: Kate Sable, MD (Inactive)  --> Will switch to Dr. Harl Bowie  Chief Complaint  Patient presents with  . Hospitalization Follow-up    History of Present Illness:    Steven Crane is a 59 y.o. male with past medical history of CAD (s/p DES to distal RCA and DES to OM1 in 2015, low-risk NST in 04/2018 and 09/2019), HTN, HLD, Type 2 DM, history of DVT and esophageal cancer who presents to the office today for follow-up.  He most recently had a telehealth visit with Dr. Bronson Ing in 05/2019 and reported having solid food dysphagia with associated chest pain at that time but denied any exertional chest pain. He was continued on his current medication regimen at that time and was scheduled for an EGD later that month for evaluation of his dysphagia.  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 He did undergo robotic assisted thoracoscopy and Ivor Lewis esophagectomy with jejunostomy tube placement on 10/18/2019. He did develop postoperative atrial fibrillation with RVR which was treated with IV Amiodarone and he converted back to normal sinus rhythm with the medication. He was discharged home on 10/25/2019 but presented back to the ED on 11/13/2019 for evaluation of upper abdominal pain and was found to have a postoperative esophageal leak. He was kept nothing by mouth with tube feeds resumed. He did have recurrent atrial fibrillation during admission and was treated with Amiodarone but only discharged on Lopressor 69m BID.  He was somewhat intolerant to J-tube feeds in the interim and had issues with the J-tube becoming clogged. It was recommended by CT surgery that he undergo EGD and esophageal stent placement which was performed by Dr. LKipp Broodon 12/14/2019. There was no obvious leak by repeat  EGD.  Again presented to the ED on 12/20/2019 for significant pain with drinking water and with leakage from his J-tube. CT imaging showed mid to distal esophageal wall thickening without evidence of perforation. He did report persistent hiccups and was started on Thorazine 50 mg 3 times daily.  Recommended to try Baclofen in place of this as Thorazine interacted with his other medications.   In talking with the patient and his aunt today, he reports having continued hiccups and feels bloated with his tube feeds. He is following up with CT Surgery later today and is hopeful he can start consuming clear liquids. He does report chest discomfort with swallowing but denies any exertional chest pain. He has lost over 100 pounds within the past 6 months in the setting of his diagnosis. He denies any specific orthopnea, PND or lower extremity edema.  He was very symptomatic with his atrial fibrillation during admission and reports palpitations at that time. He denies any recurrent symptoms since hospital discharge.  Past Medical History:  Diagnosis Date  . Anxiety   . Arthritis    "hands, back, knees" (08/27/2013)  . Asthma   . CHF (congestive heart failure) ([place] 02[place]  . Chronic bronchitis ([place]    "get it q year"  . Chronic lower back pain   . Closed head injury 1975  . Coronary artery disease May 2015   s/p successful PTCA/DES x 1 to distal RCA and PTCA/DES x 1 to OM-20 Aug 2013  . Depression   . DVT (deep  venous thrombosis) ([place]) 2008   "LLE"  . Esophageal cancer ([place])   . GERD (gastroesophageal reflux disease)   . Headache    hs of but no longer has problems   . History of kidney stones   . Hyperlipemia   . Hypertension   . Hypertriglyceridemia   . LV dysfunction    LVEF 40% at time of cardiac cath May 2015  . Morbid obesity ([place])   . Myocardial infarction ([place]) 02/2010  . Psoriasis   . Type II diabetes mellitus ([place])     Past Surgical History:  Procedure Laterality Date  .  BIOPSY  06/17/2019   Procedure: BIOPSY;  Surgeon: [name], MD;  [facility];  Service: Endoscopy;;  . COLONOSCOPY  01/06/2012   Dr. [name]: suboptimal prep. normal exam. next colonoscopy in 10 years.   . CORONARY ANGIOPLASTY WITH STENT PLACEMENT  02/2010; 08/27/2013   "1 + 3"   . ESOPHAGEAL STENT PLACEMENT N/A 12/14/2019   Procedure: ESOPHAGEAL STENT PLACEMENT;  Surgeon: Lajuana Matte, MD;  Location: [facility];  Service: Thoracic;  Laterality: N/A;  . ESOPHAGOGASTRODUODENOSCOPY N/A 10/18/2019   Procedure: ESOPHAGOGASTRODUODENOSCOPY (EGD);  Surgeon: Lajuana Matte, MD;  Location: [facility] OR;  Service: Thoracic;  Laterality: N/A;  . ESOPHAGOGASTRODUODENOSCOPY N/A 12/14/2019   Procedure: ESOPHAGOGASTRODUODENOSCOPY (EGD);  Surgeon: Lajuana Matte, MD;  Location: [facility] OR;  Service: Thoracic;  Laterality: N/A;  . ESOPHAGOGASTRODUODENOSCOPY (EGD) WITH PROPOFOL N/A 06/17/2019   Procedure: ESOPHAGOGASTRODUODENOSCOPY (EGD) WITH PROPOFOL;  Surgeon: [name], MD;  [facility];  Service: Endoscopy;  Laterality: N/A;  11:15am  . FRACTURE SURGERY    . INTERCOSTAL NERVE BLOCK  10/18/2019   Procedure: INTERCOSTAL NERVE BLOCK;  Surgeon: Lajuana Matte, MD;  Location: [place];  Service: Thoracic;;  . IR CM INJ ANY COLONIC TUBE W/FLUORO  11/17/2019  . IR IMAGING GUIDED PORT INSERTION  08/03/2019  . IR REPLC DUODEN/JEJUNO TUBE PERCUT W/FLUORO  12/04/2019  . KNEE ARTHROSCOPY Left 1981  . LEFT HEART CATHETERIZATION WITH CORONARY ANGIOGRAM N/A 08/27/2013   Procedure: LEFT HEART CATHETERIZATION WITH CORONARY ANGIOGRAM;  Surgeon: [name], MD;  [facility];  Service: Cardiovascular;  Laterality: N/A;  . NODE DISSECTION  10/18/2019   Procedure: NODE DISSECTION;  Surgeon: Lajuana Matte, MD;  Location: [place];  Service: Thoracic;;  . right wrist fracture surgery     . TIBIA IM NAIL INSERTION Left 08/21/2018   Procedure: INTRAMEDULLARY (IM) NAIL TIBIAL;   Surgeon: Rod Can, MD;  Location: [facility];  Service: Orthopedics;  Laterality: Left;    Current Medications: No facility-administered medications prior to visit.   Outpatient Medications Prior to Visit  Medication Sig Dispense Refill  . acetaminophen (TYLENOL) 160 MG/5ML solution Place 10.2-20.3 mLs (325-650 mg total) into feeding tube every 4 (four) hours as needed for mild pain or moderate pain. 120 mL 0  . baclofen (LIORESAL) 10 MG tablet Take 1 tablet (10 mg total) by mouth 3 (three) times daily. 30 each 0  . blood glucose meter kit and supplies Test glucose once daily. ICD 10 E11.9 1 each 5  . chlorproMAZINE (THORAZINE) 25 MG tablet Take 50 mg by mouth 3 (three) times daily as needed.    . insulin aspart (NOVOLOG) 100 UNIT/ML FlexPen CBG < 70: Implement Hypoglycemia Standing Orders and refer to Hypoglycemia Standing Orders sidebar report  CBG < 120 (dose in units): 0  CBG 120 - 160 (dose in units): 2  CBG 161 - 200 (dose in units): 4  CBG 201 - 250 (dose in units): 8  CBG 251 - 300 (dose in units): 12  CBG 301 - 350 (dose in units) 16  CBG 351 - 450 (dose in units): 20  CBG > 450 24 units and obtain STAT glucose and notify MD  *this will be a short term medication that will be susceptible to changes when your tube feedings are changes 15 mL 0  . insulin aspart (NOVOLOG) 100 UNIT/ML FlexPen Inject 3 Units into the skin 3 (three) times daily with meals. (Patient taking differently: Inject 3 Units into the skin 3 (three) times daily with meals. Pt using sliding scale) 15 mL 1  . insulin detemir (LEVEMIR) 100 UNIT/ML injection Inject 0.2 mLs (20 Units total) into the skin daily. (Patient taking differently: Inject 25 Units into the skin daily. ) 10 mL 11  . Insulin Pen Needle (PEN NEEDLES 3/16") 31G X 5 MM MISC Inject 3 Units as directed 6 (six) times daily. 100 each 1  . metoprolol tartrate (LOPRESSOR) 25 MG tablet Place 1 tablet (25 mg total) into feeding tube 2 (two) times  daily. Crush finely and insert in Jtube 60 tablet 1  . pantoprazole sodium (PROTONIX) 40 mg/20 mL PACK 47m./ml, 20 ml via J-Tube daily (Patient taking differently: Place 40 mg into feeding tube every evening. ) 600 mL 2  . chlorproMAZINE (THORAZINE) 50 MG tablet 1 tablet (50 mg total) by Per J Tube route 3 (three) times daily as needed for hiccoughs. 30 tablet 0  . pantoprazole (PROTONIX) 40 MG tablet SMARTSIG:2 Tablet(s) By Mouth 10 Times Daily PRN (Patient not taking: Reported on 12/13/2019)       Allergies:   Adhesive [tape]   Social History   Socioeconomic History  . Marital status: Single    Spouse name: Not on file  . Number of children: 3  . Years of education: Not on file  . Highest education level: Not on file  Occupational History  . Occupation: [employer]  Tobacco Use  . Smoking status: Never Smoker  . Smokeless tobacco: Former [employer] developer   Types: Snuff, Chew  . Tobacco comment: "started dipping @ age 89 quit for 5 1/2 yrs at one time; hadn't chewed in awhile"  Vaping Use  . Vaping Use: Never used  Substance and Sexual Activity  . Alcohol use: Not Currently    Alcohol/week: 0.0 standard drinks    Comment: occ   . Drug use: No  . Sexual activity: Yes  Other Topics Concern  . Not on file  Social History Narrative  . Not on file   Social Determinants of Health   Financial Resource Strain: Low Risk   . Difficulty of Paying Living Expenses: Not hard at all  Food Insecurity: No Food Insecurity  . Worried About RCharity fundraiserin the Last Year: Never true  . Ran Out of Food in the Last Year: Never true  Transportation Needs: No Transportation Needs  . Lack of Transportation (Medical): No  . Lack of Transportation (Non-Medical): No  Physical Activity: Insufficiently Active  . Days of Exercise per Week: 1 day  . Minutes of Exercise per Session: 20 min  Stress: No Stress Concern Present  . Feeling of Stress : Not at all  Social Connections: Moderately Integrated  .  Frequency of Communication with Friends and Family: Three times a week  . Frequency of Social Gatherings with Friends and Family: Three times a  week  . Attends Religious Services: 1 to 4 times per year  . Active Member of Clubs or Organizations: No  . Attends Archivist Meetings: Never  . Marital Status: Living with partner     Family History:  The patient's family history includes Coronary artery disease in his father; Diabetes in his father; Diabetes type II in his father; Heart attack in his father; Hypertension in his mother.   Review of Systems:   Please see the history of present illness.     General:  No chills, fever, night sweats. Positive for weight loss. Cardiovascular:  No chest pain, dyspnea on exertion, edema, orthopnea, palpitations, paroxysmal nocturnal dyspnea. Dermatological: No rash, lesions/masses Respiratory: No cough, dyspnea Urologic: No hematuria, dysuria Abdominal:   No vomiting, diarrhea, bright red blood per rectum, melena, or hematemesis. Positive for nausea, abdominal bloating and hiccups.  Neurologic:  No visual changes, wkns, changes in mental status. All other systems reviewed and are otherwise negative except as noted above.   Physical Exam:    VS:  BP 140/68   Pulse 80   Ht '6\' 2"'  (1.88 m)   Wt 212 lb 12.8 oz (96.5 kg)   SpO2 97%   BMI 27.32 kg/m    General: Chronically ill appearing male, currently in no acute distress. Frequent hiccups.  Head: Normocephalic, atraumatic. Neck: No carotid bruits. JVD not elevated.  Lungs: Respirations regular and unlabored, without wheezes or rales.  Heart: Regular rate and rhythm. No S3 or S4.  No murmur, no rubs, or gallops appreciated. Abdomen: Appears non-distended.  Msk:  Strength and tone appear normal for age. No obvious joint deformities or effusions. Extremities: No clubbing or cyanosis. No lower extremity edema.  Distal pedal pulses are 2+ bilaterally. Neuro: Alert and oriented X 3. Moves all  extremities spontaneously. No focal deficits noted. Psych:  Responds to questions appropriately with a normal affect. Skin: No rashes or lesions noted  Wt Readings from Last 3 Encounters:  12/25/19 207 lb 14.3 oz (94.3 kg)  01/11/2020 212 lb (96.2 kg)  12/27/2019 212 lb 12.8 oz (96.5 kg)     Studies/Labs Reviewed:   EKG:  EKG is not ordered today.   Recent Labs: 09/29/2019: TSH 1.792 10/20/2019: Magnesium 2.6 12/27/2019: ALT 26 12/25/2019: BUN 10; Creatinine, Ser 0.53; Hemoglobin 9.8; Platelets 263; Potassium 3.3; Sodium 137   Lipid Panel    Component Value Date/Time   CHOL 117 07/22/2019 0807   TRIG 115 07/22/2019 0807   HDL 34 (L) 07/22/2019 0807   CHOLHDL 3.4 07/22/2019 0807   CHOLHDL 4.6 01/13/2014 0828   VLDL 30 01/13/2014 0828   LDLCALC 62 07/22/2019 0807    Additional studies/ records that were reviewed today include:   NST: 09/2019  The left ventricular ejection fraction is mildly decreased (45-54%).  Nuclear stress EF: 51%.  There was no ST segment deviation noted during stress.  Defect 1: There is a medium defect of moderate severity present in the basal inferolateral, mid inferolateral and apical lateral location.  Defect 2: There is a medium defect of mild severity present in the basal inferior, mid inferior and apical inferior location.  Defect 3: There is a small defect of mild severity present in the apex location.  Findings consistent with prior myocardial infarction.  This is a low risk study.   1. Fixed inferior/inferolateral defect.  Consistent with prior infarct.  Consider echocardiogram to confirm wall motion abnormality 2. No ischemia 3. Low risk study   Assessment:  1. Coronary artery disease involving native coronary artery of native heart without angina pectoris   2. PAF (paroxysmal atrial fibrillation) ([place])   3. Essential hypertension   4. Hyperlipidemia LDL goal <70   5. Malignant neoplasm of esophagus, unspecified location [facility])       Plan:   In order of problems listed above:  1. CAD - He is s/p DES to distal RCA and DES to OM1 in 2015. Most recent ischemic evaluation was a low-risk NST in 09/2019 as outlined above. - He denies any recent exertional chest pain or dyspnea on exertion.  - Remains on Lopressor 58m BID. Was previously on ASA and statin therapy but currently held given his NPO status. Would recommend resuming the medications once cleared to do so by CT Surgery.    2. Paroxysmal Atrial Fibrillation - Diagnosed with post-operative atrial fibrillation in 09/2019 and had recurrence in 10/2019 in the setting of an esophageal leak. He experienced palpitations when in atrial fibrillation and denies any recurrences since hospital discharge. Remains on Lopressor 25 mg BID.  - This patients CHA2DS2-VASc Score and unadjusted Ischemic Stroke Rate (% per year) is equal to 7.2 % stroke rate/year from a score of 5 (HTN, Vascular, DM, DVT (2)). We discussed risks and benefits of anticoagulation. Given his recent esophageal leak, failure to thrive, complications regarding his J-tube and episodes of atrial fibrillation occurring in the setting of his acute illness, would not initiate anticoagulation at this time. I encouraged him to make u[place]oreaaware if he develops any recurrent palpitations as a monitor could be placed at that time to assess for recurrence.   3. HTN - BP is slightly elevated at 140/68 during today's visit but has been low-normal during prior admissions. Will continue hs current regimen at this time with Lopressor 223mBID.   4. HLD - He was previously on Atorvastatin 4014maily but this was discontinued following his hospitalization in 10/2019.  5. Esophageal Adenocarcinoma - previously underwent chemoradiation in 07/2019 and Ivor Lewis esophagectomy with jejunostomy tube placement on 10/18/2019. Complicated by postoperative esophageal leak and complications with his J-tube feeds. He has lost over 100 lbs since  his diagnosis and feels bloated with tube feeds along with having persistent hiccups. He does have follow-up with CT Surgery later today.    Medication Adjustments/Labs and Tests Ordered: Current medicines are reviewed at length with the patient today.  Concerns regarding medicines are outlined above.  Medication changes, Labs and Tests ordered today are listed in the Patient Instructions below. Patient Instructions  Medication Instructions:  Your physician recommends that you continue on your current medications as directed. Please refer to the Current Medication list given to you today.  *If you need a refill on your cardiac medications before your next appointment, please call your pharmacy*   Lab Work: NONE  If you have labs (blood work) drawn today and your tests are completely normal, you will receive your results only by: . MMarland KitchenChart Message (if you have MyChart) OR . A paper copy in the mail If you have any lab test that is abnormal or we need to change your treatment, we will call you to review the results.   Testing/Procedures: NONE   Follow-Up: [facility] and your health needs are our priority.  As part of our continuing mission to provide you with exceptional heart care, we have created designated Provider Care Teams.  These Care Teams include your primary Cardiologist (physician) and Advanced Practice Providers (APPs -  Physician Assistants and Nurse Practitioners) who all work together to provide you with the care you need, when you need it.  We recommend signing up for the patient portal called "MyChart".  Sign up information is provided on this After Visit Summary.  MyChart is used to connect with patients for Virtual Visits (Telemedicine).  Patients are able to view lab/test results, encounter notes, upcoming appointments, etc.  Non-urgent messages can be sent to your provider as well.   To learn more about what you can do with MyChart, go to  [email or web address].    Your next appointment:   6 month(s)  The format for your next appointment:   In Person  Provider:   Carlyle Dolly, MD   Other Instructions Thank you for choosing [facility]!       Signed, Erma Heritage, PA-C  12/25/2019 10:19 AM    Jane Lew S. [address] [place],  [address] Phone: [phone] Fax: [phone]

## 2019-12-24 NOTE — Progress Notes (Signed)
Nutrition Follow-up:  Patient s/p Ivor-lewis esophagectomy with pyloromyotomy and J-tube placement on 6/28.  Following surgery had contained esophageal perforation.  Noted esophageal stent placement on 12/14/19.   Patient came to clinic this am for nutrition follow-up, scheduled for phone visit.  Patient's aunt, Izora Gala came with him.    Patient reports that he has been running tube feeding at 32ml/hr as much as he can.  Does unhook from pump.  Unsure how many cartons he is taking in in 24 hour period.  Patient reports that hiccups come and go (ED visit for these).  Reports that he tried some water orally.  Reports that there was some concern regarding aspiration.  Patient is receiving home SLP.   Reports bowel movement about every day. Recently went 3 days without having bowel movement but then was able too.    Medications: reviewed  Labs: reviewed  Anthropometrics:   Weight 212 lb 8 oz today in clinic decreased from 217 lb on 8/20  222 lb on 8/6   Estimated Energy Needs  Kcals: 2550-2850 Protein: 125-150 g Fluid: > 2 L  NUTRITION DIAGNOSIS: Inadequate oral intake continues relying on feeding tube for nutrition   INTERVENTION:  Patient continues to keep rate of feeding at 72ml/hr (likely getting 4-5 cartons per day).  Patient not interested in increasing rate of pump based on past experience of feeling full, reflux.  Continues with prosource TID. Giving 1000 ml water flush and with medications.   Stressed importance of talking with surgeon at follow-up appointment today about if another swallow study is needed before he can resume clear liquid diet.  Patient given handout on clear liquid diet but stressed that he is not to start until approved by MD and SLP.  Patient and aunt verbalized understanding.  Also discussed with patient that would recommend he continue getting tube feeding if able to start clear liquids.   Patient had contact information    MONITORING, EVALUATION, GOAL:  weight trends, intake   NEXT VISIT: Sept 24 phone f/u  Okey Zelek B. Zenia Resides, [place], [place] Registered Dietitian [phone] (mobile)

## 2019-12-24 NOTE — H&P (Addendum)
History and Physical   Steven Crane is an 59 y.o. male.  HPI: Patient is a 59 year old male who was admitted to the hospital on 10/18/2019 with a history of esophageal cancer that underwent neoadjuvant therapy and was scheduled for surgical intervention.  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  He underwent esophagoscopy, robotic assisted laparoscopy, robotic assisted thoracoscopy, Ivor Lewis esophagectomy, pyloromyotomy, laparoscopic jejunostomy tube placement 88 French as well as intercostal nerve block.  He did well and was discharged on 10/25/2019.  He did have postoperative atrial fibrillation with RVR but subsequently converted to sinus rhythm.  Barium esophagram on 10/22/2019 showed no evidence of leak.  Tube feeds were transitioned to nightly and he was started on a clear liquid diet with progression to mechanical soft as tolerated.  And his 1 week follow-up appointment he did have some fullness with tube feedings as well as some soreness in the right chest with some drainage from an IV site in the right arm.  He was tolerating an oral diet as well as medications without difficulty at that time.  He received a prescription for Reglan and tramadol solution and scheduled to be seen in 2 weeks.  On 11/13/2018 when he presented to the emergency department with complaints of chest pain.  Full diagnostic work-up including CT scan and Gastrografin swallow did not show a leak below the level of the carina.  He also had some difficulty with J-tube function.  He was treated with IV antibiotics.  His J-tube was treated successfully with unclogging done by interventional radiology.  He was kept n.p.o. with advancement of tube feeds and discharged home.  He presented to the ER on 12/04/2019 with a clogged J-tube and IR was again able to restore function.  On 12/14/2019 he was readmitted to the hospital for a placement of a esophageal stent in  hopes that he could resume taking p.o. fluids.  This was done by Dr. Cliffton Asters.  He tolerated that procedure well and was discharged same day with clear liquid and tube feeding as diet.  He presented to the emergency department on 12/20/2019 with more complaints including difficulty swallowing his saliva, spitting up clear foamy liquid as well as low-grade fevers of uncertain etiology.  Chest x-ray at that time was unremarkable.  A CT scan at that time was done and he was felt to be stable for discharge with close follow-up in the office.  He was seen by Dr. Cliffton Asters today and there was some drainage noted from his J-tube site.  He was also noted to be extraordinarily weak and just failing at home.  He had intractable hiccups for the last 2 weeks with minimal p.o. intake he was felt to require admission for further management primarily for failure to thrive as well as removal of the stent.  Past Medical History:  Diagnosis Date  . Anxiety   . Arthritis    "hands, back, knees" (08/27/2013)  . Asthma   . CHF (congestive heart failure) (HCC) 02/2010  . Chronic bronchitis (HCC)    "get it q year"  . Chronic lower back pain   . Closed head injury 1975  . Coronary artery disease May 2015   s/p successful PTCA/DES x 1 to distal RCA and PTCA/DES x 1 to OM-20 Aug 2013  . Depression   . DVT (deep venous thrombosis) (HCC) 2008   "LLE"  . Esophageal cancer (  HCC)   . GERD (gastroesophageal reflux disease)   . Headache    hs of but no longer has problems   . History of kidney stones   . Hyperlipemia   . Hypertension   . Hypertriglyceridemia   . LV dysfunction    LVEF 40% at time of cardiac cath May 2015  . Morbid obesity (HCC)   . Myocardial infarction (HCC) 02/2010  . Psoriasis   . Type II diabetes mellitus (HCC)     Past Surgical History:  Procedure Laterality Date  . BIOPSY  06/17/2019   Procedure: BIOPSY;  Surgeon: Corbin Ade, MD;  Location: [facility];  Service: Endoscopy;;  .  COLONOSCOPY  01/06/2012   Dr. Jena Gauss: suboptimal prep. normal exam. next colonoscopy in 10 years.   . CORONARY ANGIOPLASTY WITH STENT PLACEMENT  02/2010; 08/27/2013   "1 + 3"   . ESOPHAGEAL STENT PLACEMENT N/A 12/14/2019   Procedure: ESOPHAGEAL STENT PLACEMENT;  Surgeon: Corliss Skains, MD;  Location: [facility] OR;  Service: Thoracic;  Laterality: N/A;  . ESOPHAGOGASTRODUODENOSCOPY N/A 10/18/2019   Procedure: ESOPHAGOGASTRODUODENOSCOPY (EGD);  Surgeon: Corliss Skains, MD;  Location: [facility] OR;  Service: Thoracic;  Laterality: N/A;  . ESOPHAGOGASTRODUODENOSCOPY N/A 12/14/2019   Procedure: ESOPHAGOGASTRODUODENOSCOPY (EGD);  Surgeon: Corliss Skains, MD;  Location: [facility] OR;  Service: Thoracic;  Laterality: N/A;  . ESOPHAGOGASTRODUODENOSCOPY (EGD) WITH PROPOFOL N/A 06/17/2019   Procedure: ESOPHAGOGASTRODUODENOSCOPY (EGD) WITH PROPOFOL;  Surgeon: Corbin Ade, MD;  Location: [facility];  Service: Endoscopy;  Laterality: N/A;  11:15am  . FRACTURE SURGERY    . INTERCOSTAL NERVE BLOCK  10/18/2019   Procedure: INTERCOSTAL NERVE BLOCK;  Surgeon: Corliss Skains, MD;  Location: [facility] OR;  Service: Thoracic;;  . IR CM INJ ANY COLONIC TUBE W/FLUORO  11/17/2019  . IR IMAGING GUIDED PORT INSERTION  08/03/2019  . IR REPLC DUODEN/JEJUNO TUBE PERCUT W/FLUORO  12/04/2019  . KNEE ARTHROSCOPY Left 1981  . LEFT HEART CATHETERIZATION WITH CORONARY ANGIOGRAM N/A 08/27/2013   Procedure: LEFT HEART CATHETERIZATION WITH CORONARY ANGIOGRAM;  Surgeon: Kathleene Hazel, MD;  Location: [facility] CATH LAB;  Service: Cardiovascular;  Laterality: N/A;  . NODE DISSECTION  10/18/2019   Procedure: NODE DISSECTION;  Surgeon: Corliss Skains, MD;  Location: [facility] OR;  Service: Thoracic;;  . right wrist fracture surgery     . TIBIA IM NAIL INSERTION Left 08/21/2018   Procedure: INTRAMEDULLARY (IM) NAIL TIBIAL;  Surgeon: Samson Frederic, MD;  Location: [facility] ORS;  Service: Orthopedics;  Laterality: Left;    Family History  Problem  Relation Age of Onset  . Coronary artery disease Father   . Diabetes type II Father   . Diabetes Father   . Heart attack Father   . Hypertension Mother     Social History:  reports that he has never smoked. He has quit using smokeless tobacco.  His smokeless tobacco use included snuff and chew. He reports previous alcohol use. He reports that he does not use drugs.  Allergies:  Allergies  Allergen Reactions  . Adhesive [Tape] Rash    Current Outpatient Medications  Medication Instructions  . acetaminophen (TYLENOL) 325-650 mg, Per Tube, Every 4 hours PRN  . baclofen (LIORESAL) 10 mg, Oral, 3 times daily  . blood glucose meter kit and supplies Test glucose once daily. ICD 10 E11.9  . chlorproMAZINE (THORAZINE) 50 mg, Oral, 3 times daily PRN  . insulin aspart (NOVOLOG) 100 UNIT/ML FlexPen CBG < 70: Implement Hypoglycemia Standing Orders and  refer to Hypoglycemia Standing Orders sidebar report CBG < 120 (dose in units): 0 CBG 120 - 160 (dose in units): 2 CBG 161 - 200 (dose in units): 4 CBG 201 - 250 (dose in units): 8 CBG 251 - 300 (dose in units): 12 CBG 301 - 350 (dose in units) 16 CBG 351 - 450 (dose in units): 20 CBG > 450 24 units and obtain STAT glucose and notify MD*this will be a short term medication that will be susceptible to changes when your tube feedings are changes  . insulin aspart (NOVOLOG) 3 Units, Subcutaneous, 3 times daily with meals  . insulin detemir (LEVEMIR) 20 Units, Subcutaneous, Daily  . metoprolol tartrate (LOPRESSOR) 25 mg, Per Tube, 2 times daily, Crush finely and insert in Jtube  . pantoprazole sodium (PROTONIX) 40 mg/20 mL PACK 2mg ./ml, 20 ml via J-Tube daily  . Pen Needles 3/16" 3 Units, Injection, 6 times daily   No results found for this or any previous visit (from the past 48 hour(s)).  No results found.  Review of Systems  Constitutional: Positive for appetite change, fatigue and fever. Negative for activity change.       Low grade fevers  99-100  HENT: Negative.   Eyes: Negative.   Respiratory: Positive for cough, choking, chest tightness and shortness of breath.        Hiccups  Cardiovascular: Positive for chest pain.  Gastrointestinal: Positive for abdominal distention, constipation, diarrhea and nausea. Negative for blood in stool.  Endocrine: Negative.   Genitourinary: Negative.   Musculoskeletal: Positive for back pain.  Skin:       Redness and some drainage around the j tube  Allergic/Immunologic: Negative.   Neurological: Positive for dizziness and weakness.  Hematological: Negative.   Psychiatric/Behavioral: Positive for sleep disturbance. Negative for agitation and confusion.   Blood pressure (!) 154/89, pulse 84, temperature 97.7 F (36.5 C), temperature source Oral, resp. rate 18, SpO2 94 %. Physical Exam Constitutional:      General: He is not in acute distress.    Appearance: He is obese. He is ill-appearing. He is not toxic-appearing or diaphoretic.  HENT:     Head: Normocephalic and atraumatic.     Nose: Congestion present. No rhinorrhea.     Mouth/Throat:     Mouth: Mucous membranes are dry.     Pharynx: Oropharyngeal exudate present.  Eyes:     General: No scleral icterus.    Extraocular Movements: Extraocular movements intact.     Conjunctiva/sclera: Conjunctivae normal.     Pupils: Pupils are equal, round, and reactive to light.  Cardiovascular:     Rate and Rhythm: Normal rate and regular rhythm.     Pulses: Normal pulses.     Heart sounds: Normal heart sounds. No murmur heard.   Pulmonary:     Effort: Pulmonary effort is normal. No respiratory distress.     Breath sounds: No wheezing, rhonchi or rales.  Chest:     Chest wall: No tenderness.  Abdominal:     General: There is no distension.     Palpations: Abdomen is soft. There is no mass.     Tenderness: There is no abdominal tenderness. There is no guarding or rebound.  Musculoskeletal:        General: Normal range of motion.      Cervical back: Normal range of motion. No rigidity or tenderness.     Right lower leg: Edema present.     Left lower leg: Edema present.  Lymphadenopathy:  Cervical: No cervical adenopathy.  Skin:    General: Skin is warm and dry.     Capillary Refill: Capillary refill takes less than 2 seconds.     Comments: erethema around J tube  Neurological:     General: No focal deficit present.     Mental Status: He is alert and oriented to person, place, and time.     Motor: Weakness present.  Psychiatric:        Mood and Affect: Mood normal.        Thought Content: Thought content normal.        Judgment: Judgment normal.     Assessment/Plan: Failure to thrive and significant hiccups may be associated with the esophageal stent.  Plan is for clear liquid diet as well as G-tube feedings and possible removal of the stent next week.   Steven Crane 01/19/2020, 5:25 PM   Agree with above Failure to thrive Will admit for wound care to JP site, and diet teaching Will remove stent next week.  Arlesia Kiel Keane Scrape

## 2019-12-24 NOTE — Patient Instructions (Signed)
Medication Instructions:  Your physician recommends that you continue on your current medications as directed. Please refer to the Current Medication list given to you today.  *If you need a refill on your cardiac medications before your next appointment, please call your pharmacy*   Lab Work: NONE   If you have labs (blood work) drawn today and your tests are completely normal, you will receive your results only by: . MyChart Message (if you have MyChart) OR . A paper copy in the mail If you have any lab test that is abnormal or we need to change your treatment, we will call you to review the results.   Testing/Procedures: NONE    Follow-Up: At [facility], you and your health needs are our priority.  As part of our continuing mission to provide you with exceptional heart care, we have created designated Provider Care Teams.  These Care Teams include your primary Cardiologist (physician) and Advanced Practice Providers (APPs -  Physician Assistants and Nurse Practitioners) who all work together to provide you with the care you need, when you need it.  We recommend signing up for the patient portal called "MyChart".  Sign up information is provided on this After Visit Summary.  MyChart is used to connect with patients for Virtual Visits (Telemedicine).  Patients are able to view lab/test results, encounter notes, upcoming appointments, etc.  Non-urgent messages can be sent to your provider as well.   To learn more about what you can do with MyChart, go to [email or web address].    Your next appointment:   6 month(s)  The format for your next appointment:   In Person  Provider:   Jonathan Branch, MD   Other Instructions Thank you for choosing [facility]!    

## 2019-12-25 ENCOUNTER — Encounter: Payer: Self-pay | Admitting: Student

## 2019-12-25 LAB — BASIC METABOLIC PANEL
Anion gap: 8 (ref 5–15)
BUN: 10 mg/dL (ref 6–20)
CO2: 30 mmol/L (ref 22–32)
Calcium: 8.1 mg/dL — ABNORMAL LOW (ref 8.9–10.3)
Chloride: 99 mmol/L (ref 98–111)
Creatinine, Ser: 0.53 mg/dL — ABNORMAL LOW (ref 0.61–1.24)
GFR calc Af Amer: >60 mL/min (ref >60)
GFR calc non Af Amer: >60 mL/min (ref >60)
Glucose, Bld: 159 mg/dL — ABNORMAL HIGH (ref 70–99)
Potassium: 3.3 mmol/L — ABNORMAL LOW (ref 3.5–5.1)
Sodium: 137 mmol/L (ref 135–145)

## 2019-12-25 LAB — GLUCOSE, CAPILLARY
Glucose-Capillary: 109 mg/dL — ABNORMAL HIGH (ref 70–99)
Glucose-Capillary: 135 mg/dL — ABNORMAL HIGH (ref 70–99)
Glucose-Capillary: 167 mg/dL — ABNORMAL HIGH (ref 70–99)
Glucose-Capillary: 192 mg/dL — ABNORMAL HIGH (ref 70–99)
Glucose-Capillary: 270 mg/dL — ABNORMAL HIGH (ref 70–99)
Glucose-Capillary: 248 mg/dL — ABNORMAL HIGH (ref 70–99)

## 2019-12-25 LAB — CBC
HCT: 30.8 % — ABNORMAL LOW (ref 39.0–52.0)
Hemoglobin: 9.8 g/dL — ABNORMAL LOW (ref 13.0–17.0)
MCH: 28.1 pg (ref 26.0–34.0)
MCHC: 31.8 g/dL (ref 30.0–36.0)
MCV: 88.3 fL (ref 80.0–100.0)
Platelets: 263 10*3/uL (ref 150–400)
RBC: 3.49 MIL/uL — ABNORMAL LOW (ref 4.22–5.81)
RDW: 14.3 % (ref 11.5–15.5)
WBC: 9.1 10*3/uL (ref 4.0–10.5)
nRBC: 0 % (ref 0.0–0.2)

## 2019-12-25 LAB — CULTURE, BLOOD (ROUTINE X 2)
Culture: NO GROWTH
Culture: NO GROWTH
Special Requests: ADEQUATE
Special Requests: ADEQUATE

## 2019-12-25 MED ORDER — METOPROLOL TARTRATE 25 MG PO TABS
25.0000 mg | ORAL_TABLET | Freq: Two times a day (BID) | ORAL | Status: DC
  Administered 2019-12-25 – 2019-12-27 (×6): 25 mg via ORAL
  Filled 2019-12-25 (×6): qty 1

## 2019-12-25 MED ORDER — OSMOLITE 1.5 CAL PO LIQD
1000.0000 mL | ORAL | Status: DC
  Administered 2019-12-25 – 2020-01-02 (×9): 1000 mL
  Filled 2019-12-25 (×12): qty 1000

## 2019-12-25 MED ORDER — PROSOURCE TF PO LIQD
45.0000 mL | Freq: Three times a day (TID) | ORAL | Status: DC
  Administered 2019-12-25 – 2020-01-04 (×27): 45 mL
  Filled 2019-12-25 (×27): qty 45

## 2019-12-25 MED ORDER — AMIODARONE IV BOLUS ONLY 150 MG/100ML
150.0000 mg | Freq: Once | INTRAVENOUS | Status: AC
  Administered 2019-12-25: 150 mg via INTRAVENOUS
  Filled 2019-12-25: qty 100

## 2019-12-25 MED ORDER — INSULIN ASPART 100 UNIT/ML ~~LOC~~ SOLN
0.0000 [IU] | SUBCUTANEOUS | Status: DC
  Administered 2019-12-25 – 2019-12-26 (×2): 8 [IU] via SUBCUTANEOUS
  Administered 2019-12-26: 11 [IU] via SUBCUTANEOUS

## 2019-12-25 MED ORDER — FREE WATER
200.0000 mL | Status: DC
  Administered 2019-12-25: 200 mL
  Administered 2019-12-25 (×2): 50 mL
  Administered 2019-12-25 – 2019-12-29 (×17): 200 mL

## 2019-12-25 NOTE — Progress Notes (Signed)
Initial Nutrition Assessment  DOCUMENTATION CODES:   Not applicable  INTERVENTION:   Osmolite 1.5 @70ml /hr + Pro-Source 57ml TID via tube  Free water flushes 210ml q4 hours to maintain tube patency   Regimen provides 2640kcal/day, 138g/day protein and 2457ml free water.   Pt at high refeed risk; recommend monitor K, Mg and P labs daily until stable  NUTRITION DIAGNOSIS:   Inadequate oral intake related to cancer and cancer related treatments as evidenced by other (comment) (pt with J-tube).  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  Unable to reach pt by phone x multiple attempts. Per chart review, pt has been unable to reach his goal tube feed rate of Osmolite 1.5 @70ml /hr. Pt reported to RD yesterday, that he continues to keep rate of feeding at 28ml/hr (likely getting 4-5 cartons per day).  Patient reported that he was not interested in increasing rate of pump based on past experiences of feeling full and reflux. Pt continues with prosource TID and is giving 1031ml of water flushes daily with medications. Spoke with RN, pt currently receiving tube feeds at 68ml/hr and is tolerating well. Will attempt to slowly increase to goal rate of 110ml/hr.  Suspect pt is at refeed risk. Per chart, pt has continued to loose significant amounts of weight since having his tube placed. Pt is drinking some liquids by mouth and is being followed by SLP at home.   Per chart, pt down 93lbs(31%) over the past 8 months and is down 45lbs(18%) over the past 2 months; this is severe weight loss.   Pt is at high risk for malnutrition but unable to diagnose at this time as NFPE cannot be performed.   Medications reviewed and include: lovenox, insulin, protonix   Labs reviewed: K 3.3(L), creat 0.53(L) Hgb 9.8(L), Hct 30.8(L)  NUTRITION - FOCUSED PHYSICAL EXAM: Unable to perform at this time   Diet Order:   Diet Order            Diet clear liquid Room service appropriate? Yes; Fluid consistency: Thin  Diet effective now                EDUCATION NEEDS:   Education needs have been addressed  Skin:  Skin Assessment: Reviewed RN Assessment  Last BM:  pta  Height:   Ht Readings from Last 1 Encounters:  12/25/19 6\' 2"  (1.88 m)    Weight:   Wt Readings from Last 1 Encounters:  12/25/19 94.3 kg    Ideal Body Weight:  86.3 kg  BMI:  Body mass index is 26.69 kg/m.  Estimated Nutritional Needs:   Kcal:  2500-2800kcal/day  Protein:  125-140g/day  Fluid:  2.6-2.9L/day  Koleen Distance MS, RD, LDN Please refer to [facility] for RD and/or RD on-call/weekend/after hours pager

## 2019-12-25 NOTE — Progress Notes (Signed)
     [address]       [place] [address]             [phone]       Tolerated clear diet today  Vitals:   12/25/19 0343 12/25/19 0743  BP: 134/80   Pulse: 76   Resp: 14   Temp: 98.1 F (36.7 C) 98.3 F (36.8 C)  SpO2: 98%    Alert NAD Sinus J tube site stable.  Tube feeds functioning  S/p esophagectomy, with suture line leak.  S/p sent placement.  Re-admitted for failure to thrive Continue tube feeds, and dietary instruction Continue PT.  Tiona Ruane Bary Leriche

## 2019-12-26 ENCOUNTER — Other Ambulatory Visit: Payer: Self-pay

## 2019-12-26 LAB — GLUCOSE, CAPILLARY
Glucose-Capillary: 279 mg/dL — ABNORMAL HIGH (ref 70–99)
Glucose-Capillary: 260 mg/dL — ABNORMAL HIGH (ref 70–99)
Glucose-Capillary: 314 mg/dL — ABNORMAL HIGH (ref 70–99)
Glucose-Capillary: 196 mg/dL — ABNORMAL HIGH (ref 70–99)
Glucose-Capillary: 234 mg/dL — ABNORMAL HIGH (ref 70–99)
Glucose-Capillary: 254 mg/dL — ABNORMAL HIGH (ref 70–99)

## 2019-12-26 MED ORDER — AMIODARONE HCL 200 MG PO TABS
200.0000 mg | ORAL_TABLET | Freq: Every day | ORAL | Status: DC
  Administered 2019-12-26 – 2019-12-27 (×2): 200 mg via JEJUNOSTOMY
  Filled 2019-12-26 (×2): qty 1

## 2019-12-26 MED ORDER — INSULIN ASPART 100 UNIT/ML ~~LOC~~ SOLN
6.0000 [IU] | Freq: Three times a day (TID) | SUBCUTANEOUS | Status: DC
  Administered 2019-12-26 – 2020-01-05 (×21): 6 [IU] via SUBCUTANEOUS

## 2019-12-26 MED ORDER — POTASSIUM CHLORIDE 20 MEQ/15ML (10%) PO SOLN
40.0000 meq | Freq: Two times a day (BID) | ORAL | Status: AC
  Administered 2019-12-26 (×2): 40 meq
  Filled 2019-12-26 (×2): qty 30

## 2019-12-26 MED ORDER — INSULIN ASPART 100 UNIT/ML ~~LOC~~ SOLN
0.0000 [IU] | Freq: Three times a day (TID) | SUBCUTANEOUS | Status: DC
  Administered 2019-12-26 (×2): 4 [IU] via SUBCUTANEOUS
  Administered 2019-12-27: 3 [IU] via SUBCUTANEOUS
  Administered 2019-12-27: 7 [IU] via SUBCUTANEOUS
  Administered 2019-12-27: 15 [IU] via SUBCUTANEOUS
  Administered 2019-12-29: 7 [IU] via SUBCUTANEOUS

## 2019-12-26 MED ORDER — INSULIN ASPART 100 UNIT/ML ~~LOC~~ SOLN
0.0000 [IU] | Freq: Every day | SUBCUTANEOUS | Status: DC
  Administered 2019-12-26: 3 [IU] via SUBCUTANEOUS

## 2019-12-26 NOTE — Progress Notes (Signed)
Pt with increasing HR= 120's - 130's sustaining. BP=116/82. EKG done which confirmed Afib with RVR. Pt is asymptomatic. Dr Orvan Seen informed. Amiodarone 150 mg IV bolus given  as ordered. HR=110's after bolus. Will continue to monitor pt.

## 2019-12-26 NOTE — Plan of Care (Signed)
  Problem: Education: Goal: Knowledge of General Education information will improve Description: Including pain rating scale, medication(s)/side effects and non-pharmacologic comfort measures Outcome: Progressing   Problem: Health Behavior/Discharge Planning: Goal: Ability to manage health-related needs will improve Outcome: Progressing   Problem: Clinical Measurements: Goal: Ability to maintain clinical measurements within normal limits will improve Outcome: Progressing Goal: Will remain free from infection Outcome: Progressing Goal: Diagnostic test results will improve Outcome: Progressing Goal: Respiratory complications will improve Outcome: Progressing Goal: Cardiovascular complication will be avoided Outcome: Progressing   Problem: Activity: Goal: Risk for activity intolerance will decrease Outcome: Progressing   Problem: Nutrition: Goal: Adequate nutrition will be maintained Outcome: Progressing   Problem: Coping: Goal: Level of anxiety will decrease Outcome: Progressing   Problem: Elimination: Goal: Will not experience complications related to bowel motility Outcome: Progressing Goal: Will not experience complications related to urinary retention Outcome: Progressing   Problem: Pain Managment: Goal: General experience of comfort will improve Outcome: Progressing   Problem: Safety: Goal: Ability to remain free from injury will improve Outcome: Progressing   Problem: Skin Integrity: Goal: Risk for impaired skin integrity will decrease Outcome: Progressing   Problem: Education: Goal: Ability to describe self-care measures that may prevent or decrease complications (Diabetes Survival Skills Education) will improve Outcome: Progressing Goal: Individualized Educational Video(s) Outcome: Progressing   Problem: Coping: Goal: Ability to adjust to condition or change in health will improve Outcome: Progressing   Problem: Fluid Volume: Goal: Ability to  maintain a balanced intake and output will improve Outcome: Progressing   Problem: Health Behavior/Discharge Planning: Goal: Ability to identify and utilize available resources and services will improve Outcome: Progressing Goal: Ability to manage health-related needs will improve Outcome: Progressing   Problem: Metabolic: Goal: Ability to maintain appropriate glucose levels will improve Outcome: Progressing   Problem: Nutritional: Goal: Maintenance of adequate nutrition will improve Outcome: Progressing Goal: Progress toward achieving an optimal weight will improve Outcome: Progressing   Problem: Skin Integrity: Goal: Risk for impaired skin integrity will decrease Outcome: Progressing   Problem: Tissue Perfusion: Goal: Adequacy of tissue perfusion will improve Outcome: Progressing   Problem: Education: Goal: Knowledge of the prescribed therapeutic regimen will improve Outcome: Progressing   Problem: Bowel/Gastric: Goal: Gastrointestinal status for postoperative course will improve Outcome: Progressing   Problem: Nutritional: Goal: Ability to achieve adequate nutritional intake will improve Outcome: Progressing   Problem: Clinical Measurements: Goal: Postoperative complications will be avoided or minimized Outcome: Progressing   Problem: Respiratory: Goal: Ability to maintain a clear airway will improve Outcome: Progressing   Problem: Skin Integrity: Goal: Demonstration of wound healing without infection will improve Outcome: Progressing

## 2019-12-26 NOTE — Progress Notes (Addendum)
[address]       [place] [address]             [phone]         Subjective: Having hiccups afib overnight, got a 150 mg IV bolus- now in sinus  Objective: Vital signs in last 24 hours: Temp:  [98.2 F (36.8 C)-99.1 F (37.3 C)] 98.2 F (36.8 C) (09/05 0733) Pulse Rate:  [71-112] 93 (09/05 0733) Cardiac Rhythm: Normal sinus rhythm (09/05 0400) Resp:  [14-20] 20 (09/05 0733) BP: (102-145)/(75-86) 131/75 (09/05 0733) SpO2:  [95 %-98 %] 97 % (09/05 0733) Weight:  [95.3 kg] 95.3 kg (09/05 0600)  Hemodynamic parameters for last 24 hours:    Intake/Output from previous day: 09/04 0701 - 09/05 0700 In: 1511.7 [P.O.:600; I.V.:100; NG/GT:811.7] Out: 1100 [Urine:1100] Intake/Output this shift: No intake/output data recorded.  General appearance: alert, cooperative and no distress Heart: regular rate and rhythm Lungs: min dim in bases Abdomen: soft, nontender Extremities: min edema Wound: well healed, J tube site is stable  Lab Results: Recent Labs    27-Dec-2019 2100 12/25/19 0716  WBC 11.4* 9.1  HGB 10.5* 9.8*  HCT 32.5* 30.8*  PLT 293 263   BMET:  Recent Labs    12-27-2019 2100 12/25/19 0716  NA 136 137  K 3.1* 3.3*  CL 98 99  CO2 27 30  GLUCOSE 104* 159*  BUN 13 10  CREATININE 0.50* 0.53*  CALCIUM 8.3* 8.1*    PT/INR: No results for input(s): LABPROT, INR in the last 72 hours. ABG    Component Value Date/Time   PHART 7.462 (H) 10/19/2019 0510   HCO3 23.7 10/19/2019 0510   TCO2 26 10/18/2019 1640   ACIDBASEDEF 3.9 (H) 10/18/2019 1809   O2SAT 98.4 10/19/2019 0510   CBG (last 3)  Recent Labs    12/25/19 2304 12/26/19 0419 12/26/19 0825  GLUCAP 279* 260* 314*    Meds Scheduled Meds: . Chlorhexidine Gluconate Cloth  6 each Topical Daily  . enoxaparin (LOVENOX) injection  40 mg Subcutaneous Q24H  . feeding supplement (PROSource TF)  45 mL Per Tube TID  . free water  200 mL Per Tube Q4H  . insulin aspart  0-15 Units  Subcutaneous Q4H  . insulin aspart  3 Units Subcutaneous TID WC  . metoprolol tartrate  25 mg Oral BID  . pantoprazole sodium  40 mg Per Tube Q1200  . sodium chloride flush  10-40 mL Intracatheter Q12H  . sodium chloride flush  3 mL Intravenous Q12H   Continuous Infusions: . sodium chloride    . feeding supplement (OSMOLITE 1.5 CAL) 1,000 mL (12/26/19 0700)   PRN Meds:.sodium chloride, acetaminophen (TYLENOL) oral liquid 160 mg/5 mL, chlorproMAZINE, morphine injection, ondansetron (ZOFRAN) IV, sodium chloride flush, sodium chloride flush  Xrays Portable chest 1 View  Result Date: 12/27/19 CLINICAL DATA:  Status post esophagectomy EXAM: PORTABLE CHEST 1 VIEW COMPARISON:  12/20/2019 FINDINGS: Lungs are clear. No pneumothorax or pleural effusion. Right internal jugular chest port is seen with its tip within the superior vena cava. Esophageal stent graft is seen unchanged in position extending from the thoracic inlet through the level of the right hilum, presumably across the proximal anastomosis of gastric pull-up. Cardiac size is within normal limits. Bilateral hilar enlargement is again seen and relates to central pulmonary arterial enlargement, better noted on prior CT examination of 12/20/2019. Pulmonary vascularity is normal. No acute bone abnormality. IMPRESSION: 1. No active cardiopulmonary disease. 2.  Esophageal stent graft unchanged in position. 3. Central pulmonary arterial enlargement, better noted on prior CT examination. Electronically Signed   By: Helyn Numbers MD   On: 01/10/2020 19:33    Assessment/Plan:  1 stable overall 2 will add per tube  amio for afib, cont metoprolol 3 adjust insulin as CBG's running high 4 afeb, BP stable 5 replace K+  6 cont TF;s  7 removal of stent this week  LOS: 2 days    Rowe Clack PA-C Pager [phone] 12/26/2019   Agree with above Will remove stent early next week.  Dshawn Mcnay Keane Scrape

## 2019-12-26 NOTE — Progress Notes (Signed)
Pt converted to NSR at 02:21 AM. EKG done to confirmed. V/S stable. At the same time pt c/o of chest tightness & discomfort with pain score of 4/10. Morphine 2 mg IV given as PRN dose with relief. Paged Dr Orvan Seen. No new orders made.

## 2019-12-27 ENCOUNTER — Encounter (HOSPITAL_COMMUNITY): Payer: Self-pay | Admitting: Thoracic Surgery (Cardiothoracic Vascular Surgery)

## 2019-12-27 LAB — BASIC METABOLIC PANEL
Anion gap: 4 — ABNORMAL LOW (ref 5–15)
BUN: 9 mg/dL (ref 6–20)
CO2: 30 mmol/L (ref 22–32)
Calcium: 7.7 mg/dL — ABNORMAL LOW (ref 8.9–10.3)
Chloride: 96 mmol/L — ABNORMAL LOW (ref 98–111)
Creatinine, Ser: 0.45 mg/dL — ABNORMAL LOW (ref 0.61–1.24)
GFR calc Af Amer: >60 mL/min (ref >60)
GFR calc non Af Amer: >60 mL/min (ref >60)
Glucose, Bld: 297 mg/dL — ABNORMAL HIGH (ref 70–99)
Potassium: 4 mmol/L (ref 3.5–5.1)
Sodium: 130 mmol/L — ABNORMAL LOW (ref 135–145)

## 2019-12-27 LAB — GLUCOSE, CAPILLARY
Glucose-Capillary: 172 mg/dL — ABNORMAL HIGH (ref 70–99)
Glucose-Capillary: 222 mg/dL — ABNORMAL HIGH (ref 70–99)
Glucose-Capillary: 236 mg/dL — ABNORMAL HIGH (ref 70–99)
Glucose-Capillary: 244 mg/dL — ABNORMAL HIGH (ref 70–99)
Glucose-Capillary: 313 mg/dL — ABNORMAL HIGH (ref 70–99)
Glucose-Capillary: 98 mg/dL (ref 70–99)

## 2019-12-27 MED ORDER — AMIODARONE LOAD VIA INFUSION
150.0000 mg | Freq: Once | INTRAVENOUS | Status: AC
  Administered 2019-12-27: 150 mg via INTRAVENOUS
  Filled 2019-12-27: qty 83.34

## 2019-12-27 MED ORDER — INSULIN DETEMIR 100 UNIT/ML ~~LOC~~ SOLN
25.0000 [IU] | Freq: Every day | SUBCUTANEOUS | Status: DC
  Administered 2019-12-27 – 2020-01-06 (×11): 25 [IU] via SUBCUTANEOUS
  Filled 2019-12-27 (×12): qty 0.25

## 2019-12-27 MED ORDER — AMIODARONE HCL IN DEXTROSE 360-4.14 MG/200ML-% IV SOLN
60.0000 mg/h | INTRAVENOUS | Status: AC
  Administered 2019-12-27 (×2): 60 mg/h via INTRAVENOUS
  Filled 2019-12-27 (×2): qty 200

## 2019-12-27 MED ORDER — GUAIFENESIN-DM 100-10 MG/5ML PO SYRP
5.0000 mL | ORAL_SOLUTION | ORAL | Status: DC | PRN
  Administered 2019-12-27 – 2020-01-06 (×4): 5 mL via ORAL
  Filled 2019-12-27 (×5): qty 5

## 2019-12-27 MED ORDER — AMIODARONE HCL IN DEXTROSE 360-4.14 MG/200ML-% IV SOLN
30.0000 mg/h | INTRAVENOUS | Status: DC
  Administered 2019-12-28 – 2019-12-29 (×3): 30 mg/h via INTRAVENOUS
  Filled 2019-12-27 (×2): qty 200

## 2019-12-27 NOTE — Progress Notes (Signed)
Pt found to be in afib rvr with rate up to 150s during bedside reporting. Dr. Kipp Brood paged and new orders for amio bolus and infusion received. Will continue to monitor.

## 2019-12-27 NOTE — Plan of Care (Signed)
  Problem: Education: Goal: Knowledge of General Education information will improve Description: Including pain rating scale, medication(s)/side effects and non-pharmacologic comfort measures Outcome: Progressing   Problem: Health Behavior/Discharge Planning: Goal: Ability to manage health-related needs will improve Outcome: Progressing   Problem: Clinical Measurements: Goal: Ability to maintain clinical measurements within normal limits will improve Outcome: Progressing Goal: Will remain free from infection Outcome: Progressing Goal: Diagnostic test results will improve Outcome: Progressing Goal: Respiratory complications will improve Outcome: Progressing Goal: Cardiovascular complication will be avoided Outcome: Progressing   Problem: Activity: Goal: Risk for activity intolerance will decrease Outcome: Progressing   Problem: Nutrition: Goal: Adequate nutrition will be maintained Outcome: Progressing   Problem: Coping: Goal: Level of anxiety will decrease Outcome: Progressing   Problem: Elimination: Goal: Will not experience complications related to bowel motility Outcome: Progressing Goal: Will not experience complications related to urinary retention Outcome: Progressing   Problem: Pain Managment: Goal: General experience of comfort will improve Outcome: Progressing   Problem: Safety: Goal: Ability to remain free from injury will improve Outcome: Progressing   Problem: Skin Integrity: Goal: Risk for impaired skin integrity will decrease Outcome: Progressing   Problem: Education: Goal: Ability to describe self-care measures that may prevent or decrease complications (Diabetes Survival Skills Education) will improve Outcome: Progressing Goal: Individualized Educational Video(s) Outcome: Progressing   Problem: Coping: Goal: Ability to adjust to condition or change in health will improve Outcome: Progressing   Problem: Fluid Volume: Goal: Ability to  maintain a balanced intake and output will improve Outcome: Progressing   Problem: Health Behavior/Discharge Planning: Goal: Ability to identify and utilize available resources and services will improve Outcome: Progressing Goal: Ability to manage health-related needs will improve Outcome: Progressing   Problem: Metabolic: Goal: Ability to maintain appropriate glucose levels will improve Outcome: Progressing   Problem: Nutritional: Goal: Maintenance of adequate nutrition will improve Outcome: Progressing Goal: Progress toward achieving an optimal weight will improve Outcome: Progressing   Problem: Skin Integrity: Goal: Risk for impaired skin integrity will decrease Outcome: Progressing   Problem: Tissue Perfusion: Goal: Adequacy of tissue perfusion will improve Outcome: Progressing   Problem: Education: Goal: Knowledge of the prescribed therapeutic regimen will improve Outcome: Progressing   Problem: Bowel/Gastric: Goal: Gastrointestinal status for postoperative course will improve Outcome: Progressing   Problem: Nutritional: Goal: Ability to achieve adequate nutritional intake will improve Outcome: Progressing   Problem: Clinical Measurements: Goal: Postoperative complications will be avoided or minimized Outcome: Progressing   Problem: Respiratory: Goal: Ability to maintain a clear airway will improve Outcome: Progressing   Problem: Skin Integrity: Goal: Demonstration of wound healing without infection will improve Outcome: Progressing

## 2019-12-27 NOTE — Plan of Care (Signed)
  Problem: Nutritional: Goal: Maintenance of adequate nutrition will improve Outcome: Progressing Goal: Progress toward achieving an optimal weight will improve Outcome: Progressing   Problem: Skin Integrity: Goal: Risk for impaired skin integrity will decrease Outcome: Progressing   Problem: Tissue Perfusion: Goal: Adequacy of tissue perfusion will improve Outcome: Progressing   Problem: Bowel/Gastric: Goal: Gastrointestinal status for postoperative course will improve Outcome: Progressing   Problem: Nutritional: Goal: Ability to achieve adequate nutritional intake will improve Outcome: Progressing   Problem: Clinical Measurements: Goal: Postoperative complications will be avoided or minimized Outcome: Progressing   Problem: Respiratory: Goal: Ability to maintain a clear airway will improve Outcome: Progressing   Problem: Skin Integrity: Goal: Demonstration of wound healing without infection will improve Outcome: Progressing

## 2019-12-27 NOTE — Progress Notes (Addendum)
Patient not tolerating tube feed at rate of 65 mLs.  Patient wants tube feed turned back down to 60 ml states "tube feed backing up into chest" and making him burp up the feed.  RN educated patient on importance of orders for nutritional intake. RN turned feeding down to 60 ml as requested. RN will continue to monitor.  RN changed patients dressing on J-tube. Significant break of skin around site. Copious thick greenish-yellow drainage on dressing.  RN placed barrier cream on skin for protection and redressed site with split gauze and 4x4 guaze.

## 2019-12-27 NOTE — Progress Notes (Signed)
     [address]       [place] [address]             [phone]       No events.  Continue to have hiccups  Vitals:   12/27/19 0300 12/27/19 0741  BP: 140/87 (!) 143/78  Pulse: 79 85  Resp: 17 15  Temp: 98.8 F (37.1 C) 99 F (37.2 C)  SpO2: 96% 95%    Alert NAD Sinus EWOB J tube in place  59 yo male s/p esophagectomy, now with esophageal stent for late leak OR tomorrow for stent removal Will increase BG coverage.  Roselynne Lortz Bary Leriche

## 2019-12-28 ENCOUNTER — Inpatient Hospital Stay (HOSPITAL_COMMUNITY): Payer: BC Managed Care – PPO | Admitting: Certified Registered"

## 2019-12-28 ENCOUNTER — Encounter (HOSPITAL_COMMUNITY)
Admission: AD | Disposition: E | Payer: Self-pay | Source: Home / Self Care | Attending: Thoracic Surgery (Cardiothoracic Vascular Surgery)

## 2019-12-28 ENCOUNTER — Inpatient Hospital Stay (HOSPITAL_COMMUNITY): Payer: BC Managed Care – PPO

## 2019-12-28 ENCOUNTER — Encounter (HOSPITAL_COMMUNITY): Payer: Self-pay | Admitting: Thoracic Surgery (Cardiothoracic Vascular Surgery)

## 2019-12-28 DIAGNOSIS — J95812 Postprocedural air leak: Secondary | ICD-10-CM

## 2019-12-28 HISTORY — PX: ESOPHAGOGASTRODUODENOSCOPY: SHX5428

## 2019-12-28 LAB — GLUCOSE, CAPILLARY
Glucose-Capillary: 111 mg/dL — ABNORMAL HIGH (ref 70–99)
Glucose-Capillary: 114 mg/dL — ABNORMAL HIGH (ref 70–99)
Glucose-Capillary: 117 mg/dL — ABNORMAL HIGH (ref 70–99)
Glucose-Capillary: 118 mg/dL — ABNORMAL HIGH (ref 70–99)
Glucose-Capillary: 118 mg/dL — ABNORMAL HIGH (ref 70–99)
Glucose-Capillary: 122 mg/dL — ABNORMAL HIGH (ref 70–99)

## 2019-12-28 SURGERY — EGD (ESOPHAGOGASTRODUODENOSCOPY)
Anesthesia: General

## 2019-12-28 MED ORDER — STERILE WATER FOR IRRIGATION IR SOLN
Status: DC | PRN
  Administered 2019-12-28: 1000 mL

## 2019-12-28 MED ORDER — PROPOFOL 10 MG/ML IV BOLUS
INTRAVENOUS | Status: DC | PRN
  Administered 2019-12-28: 120 mg via INTRAVENOUS

## 2019-12-28 MED ORDER — SUGAMMADEX SODIUM 200 MG/2ML IV SOLN
INTRAVENOUS | Status: DC | PRN
  Administered 2019-12-28: 100 mg via INTRAVENOUS
  Administered 2019-12-28: 200 mg via INTRAVENOUS

## 2019-12-28 MED ORDER — PROPOFOL 10 MG/ML IV BOLUS
INTRAVENOUS | Status: AC
  Filled 2019-12-28: qty 20

## 2019-12-28 MED ORDER — MIDAZOLAM HCL 2 MG/2ML IJ SOLN
INTRAMUSCULAR | Status: DC | PRN
  Administered 2019-12-28: 2 mg via INTRAVENOUS

## 2019-12-28 MED ORDER — PHENYLEPHRINE 40 MCG/ML (10ML) SYRINGE FOR IV PUSH (FOR BLOOD PRESSURE SUPPORT)
PREFILLED_SYRINGE | INTRAVENOUS | Status: DC | PRN
  Administered 2019-12-28: 80 ug via INTRAVENOUS
  Administered 2019-12-28: 120 ug via INTRAVENOUS

## 2019-12-28 MED ORDER — METOPROLOL TARTRATE 5 MG/5ML IV SOLN
5.0000 mg | Freq: Two times a day (BID) | INTRAVENOUS | Status: DC
  Administered 2019-12-28 – 2020-01-06 (×18): 5 mg via INTRAVENOUS
  Filled 2019-12-28 (×19): qty 5

## 2019-12-28 MED ORDER — ONDANSETRON HCL 4 MG/2ML IJ SOLN
INTRAMUSCULAR | Status: DC | PRN
  Administered 2019-12-28: 4 mg via INTRAVENOUS

## 2019-12-28 MED ORDER — FENTANYL CITRATE (PF) 250 MCG/5ML IJ SOLN
INTRAMUSCULAR | Status: AC
  Filled 2019-12-28: qty 5

## 2019-12-28 MED ORDER — LIDOCAINE 2% (20 MG/ML) 5 ML SYRINGE
INTRAMUSCULAR | Status: DC | PRN
  Administered 2019-12-28: 80 mg via INTRAVENOUS

## 2019-12-28 MED ORDER — MIDAZOLAM HCL 2 MG/2ML IJ SOLN
INTRAMUSCULAR | Status: AC
  Filled 2019-12-28: qty 2

## 2019-12-28 MED ORDER — LACTATED RINGERS IV SOLN: INTRAVENOUS | Status: DC | PRN

## 2019-12-28 MED ORDER — EPINEPHRINE PF 1 MG/ML IJ SOLN
INTRAMUSCULAR | Status: AC
  Filled 2019-12-28: qty 1

## 2019-12-28 MED ORDER — FENTANYL CITRATE (PF) 250 MCG/5ML IJ SOLN
INTRAMUSCULAR | Status: DC | PRN
Start: 2019-12-28 — End: 2019-12-28
  Administered 2019-12-28: 100 ug via INTRAVENOUS

## 2019-12-28 MED ORDER — IOHEXOL 300 MG/ML  SOLN
150.0000 mL | Freq: Once | INTRAMUSCULAR | Status: AC | PRN
  Administered 2019-12-28: 125 mL via ORAL

## 2019-12-28 MED ORDER — ROCURONIUM BROMIDE 10 MG/ML (PF) SYRINGE
PREFILLED_SYRINGE | INTRAVENOUS | Status: DC | PRN
  Administered 2019-12-28: 60 mg via INTRAVENOUS

## 2019-12-28 SURGICAL SUPPLY — 31 items
BLADE CLIPPER SURG (BLADE) IMPLANT
BUTTON OLYMPUS DEFENDO 5 PIECE (MISCELLANEOUS) ×2 IMPLANT
CANISTER SUCT 3000ML PPV (MISCELLANEOUS) ×3 IMPLANT
CNTNR URN SCR LID CUP LEK RST (MISCELLANEOUS) IMPLANT
CONT SPEC 4OZ STRL OR WHT (MISCELLANEOUS)
COVER BACK TABLE 60X90IN (DRAPES) IMPLANT
FORCEPS GRASP COMBO 8X230 (FORCEP) ×3 IMPLANT
GAUZE SPONGE 4X4 12PLY STRL (GAUZE/BANDAGES/DRESSINGS) ×3 IMPLANT
GLOVE BIO SURGEON STRL SZ7 (GLOVE) IMPLANT
GLOVE BIO SURGEON STRL SZ7.5 (GLOVE) IMPLANT
GLOVE BIOGEL PI IND STRL 6.5 (GLOVE) ×1 IMPLANT
GLOVE BIOGEL PI IND STRL 7.5 (GLOVE) ×2 IMPLANT
GLOVE BIOGEL PI INDICATOR 6.5 (GLOVE) ×2
GLOVE BIOGEL PI INDICATOR 7.5 (GLOVE) ×4
GLOVE SURG SS PI 7.0 STRL IVOR (GLOVE) ×3 IMPLANT
GOWN STRL REUS W/ TWL XL LVL3 (GOWN DISPOSABLE) ×1 IMPLANT
GOWN STRL REUS W/TWL XL LVL3 (GOWN DISPOSABLE) ×3
KIT TURNOVER KIT B (KITS) ×3 IMPLANT
MARKER SKIN DUAL TIP RULER LAB (MISCELLANEOUS) IMPLANT
NS IRRIG 1000ML POUR BTL (IV SOLUTION) IMPLANT
OIL SILICONE PENTAX (PARTS (SERVICE/REPAIRS)) IMPLANT
PAD ARMBOARD 7.5X6 YLW CONV (MISCELLANEOUS) IMPLANT
SYR 20ML ECCENTRIC (SYRINGE) ×3 IMPLANT
SYR 30ML SLIP (SYRINGE) IMPLANT
TOWEL GREEN STERILE (TOWEL DISPOSABLE) IMPLANT
TOWEL GREEN STERILE FF (TOWEL DISPOSABLE) IMPLANT
TUBE CONNECTING 20'X1/4 (TUBING) ×1
TUBE CONNECTING 20X1/4 (TUBING) ×2 IMPLANT
TUBING ENDO SMARTCAP (MISCELLANEOUS) ×3 IMPLANT
UNDERPAD 30X36 HEAVY ABSORB (UNDERPADS AND DIAPERS) ×3 IMPLANT
WATER STERILE IRR 1000ML POUR (IV SOLUTION) ×3 IMPLANT

## 2019-12-28 NOTE — Transfer of Care (Signed)
Immediate Anesthesia Transfer of Care Note  Patient: Steven Crane  Procedure(s) Performed: ESOPHAGOGASTRODUODENOSCOPY (EGD) REMOVAL OF ESOPHAGEAL STENT (N/A )  Patient Location: PACU  Anesthesia Type:General  Level of Consciousness: awake, drowsy and responds to stimulation  Airway & Oxygen Therapy: Patient Spontanous Breathing and Patient connected to nasal cannula oxygen  Post-op Assessment: Report given to RN and Post -op Vital signs reviewed and stable  Post vital signs: Reviewed and stable  Last Vitals:  Vitals Value Taken Time  BP 133/80 01/01/2020 0825  Temp    Pulse 77 01/10/2020 0827  Resp 21 01/03/2020 0827  SpO2 91 % 12/29/2019 0827  Vitals shown include unvalidated device data.  Last Pain:  Vitals:   01/05/2020 0300  TempSrc: Oral  PainSc:          Complications: No complications documented.

## 2019-12-28 NOTE — Op Note (Signed)
[address]       [place] [address]             [phone]        12/29/2019  Patient:  Steven Crane Pre-Op Dx: History of Ivor Lewis esophagectomy with late conduit leak.   Status post esophageal stent placement. Post-op Dx: Same Procedure: - Esophagogastroscopy -Removal of esophageal stent.  Surgeon and Role:      * Willi Borowiak, Eliezer Lofts, MD - Primary  Anesthesia  general EBL: Minimal  Blood Administration: None Specimen: None   Counts: correct   Indications: [name] is a 59 year old male with a history of stage III esophageal cancer who underwent a minimally invasive Ivor Lewis esophagectomy in July.  Postoperatively he did well was discharged on postoperative day 6.  While advancing his diet at home he did develop a small staple line leak that originally was managed conservatively.  But due to tube feed intolerance the decision was made to place esophageal stent to allow for p.o. intake.  Since 2 weeks following stent placement he developed intractable hiccups thus a decision was made to remove the stent.  Findings: The esophageal stent was removed without difficulty.  There was significant proteinaceous exudate along the distal portion of where the stent was placed.  The mucosa was also very swollen.  I had a very difficult time delineating the true lumen from the previous perforation site.  Due to concern of tunneling through a false tract I elected to abort the procedure and obtain a swallow study and the radiology department.  Operative Technique: After the risks, benefits and alternatives were thoroughly discussed, the patient was brought to the operative theatre.  Anesthesia was induced. The patient was prepped and draped in normal sterile fashion.  An appropriate surgical pause was performed, and pre-operative antibiotics were dosed accordingly.  The gastroscope was advanced through the oropharynx into the cervical esophagus under direct  visualization.  The scope was passed down through the stent, and it was irrigated.  The green stitch was identified and using an endoscopic grasper we were able to remove the stent without difficulty.  The scope was then taken back down for a final inspection.  There was significant edema proteinaceous exudate along the distal portion of the gastric conduit.  This was irrigated.  I was not able to pass the scope down further and was not able to identify the pylorus.  At this point I elected to conclude the procedure.   The patient tolerated the procedure without any immediate complications, and was transferred to the PACU in stable condition.  Katryna Tschirhart Keane Scrape

## 2019-12-28 NOTE — Plan of Care (Signed)
  Problem: Education: Goal: Knowledge of General Education information will improve Description: Including pain rating scale, medication(s)/side effects and non-pharmacologic comfort measures Outcome: Progressing   Problem: Health Behavior/Discharge Planning: Goal: Ability to manage health-related needs will improve Outcome: Progressing   Problem: Clinical Measurements: Goal: Ability to maintain clinical measurements within normal limits will improve Outcome: Progressing Goal: Will remain free from infection Outcome: Progressing Goal: Diagnostic test results will improve Outcome: Progressing Goal: Respiratory complications will improve Outcome: Progressing Goal: Cardiovascular complication will be avoided Outcome: Progressing   Problem: Activity: Goal: Risk for activity intolerance will decrease Outcome: Progressing   Problem: Nutrition: Goal: Adequate nutrition will be maintained Outcome: Progressing   Problem: Coping: Goal: Level of anxiety will decrease Outcome: Progressing   Problem: Elimination: Goal: Will not experience complications related to bowel motility Outcome: Progressing Goal: Will not experience complications related to urinary retention Outcome: Progressing   Problem: Pain Managment: Goal: General experience of comfort will improve Outcome: Progressing   Problem: Safety: Goal: Ability to remain free from injury will improve Outcome: Progressing   Problem: Skin Integrity: Goal: Risk for impaired skin integrity will decrease Outcome: Progressing   Problem: Education: Goal: Ability to describe self-care measures that may prevent or decrease complications (Diabetes Survival Skills Education) will improve Outcome: Progressing Goal: Individualized Educational Video(s) Outcome: Progressing   Problem: Coping: Goal: Ability to adjust to condition or change in health will improve Outcome: Progressing   Problem: Fluid Volume: Goal: Ability to  maintain a balanced intake and output will improve Outcome: Progressing   Problem: Health Behavior/Discharge Planning: Goal: Ability to identify and utilize available resources and services will improve Outcome: Progressing Goal: Ability to manage health-related needs will improve Outcome: Progressing   Problem: Metabolic: Goal: Ability to maintain appropriate glucose levels will improve Outcome: Progressing   Problem: Nutritional: Goal: Maintenance of adequate nutrition will improve Outcome: Progressing Goal: Progress toward achieving an optimal weight will improve Outcome: Progressing   Problem: Skin Integrity: Goal: Risk for impaired skin integrity will decrease Outcome: Progressing   Problem: Tissue Perfusion: Goal: Adequacy of tissue perfusion will improve Outcome: Progressing   Problem: Education: Goal: Knowledge of the prescribed therapeutic regimen will improve Outcome: Progressing   Problem: Bowel/Gastric: Goal: Gastrointestinal status for postoperative course will improve Outcome: Progressing   Problem: Nutritional: Goal: Ability to achieve adequate nutritional intake will improve Outcome: Progressing   Problem: Clinical Measurements: Goal: Postoperative complications will be avoided or minimized Outcome: Progressing   Problem: Respiratory: Goal: Ability to maintain a clear airway will improve Outcome: Progressing

## 2019-12-28 NOTE — Plan of Care (Signed)

## 2019-12-28 NOTE — Anesthesia Preprocedure Evaluation (Addendum)
Anesthesia Evaluation  Patient identified by MRN, date of birth, ID band Patient awake    Reviewed: Allergy & Precautions, NPO status , Patient's Chart, lab work & pertinent test results, reviewed documented beta blocker date and time   History of Anesthesia Complications Negative for: history of anesthetic complications  Airway Mallampati: III  TM Distance: >3 FB Neck ROM: Full    Dental  (+) Dental Advisory Given, Missing,    Pulmonary asthma ,  Covid-19 Nucleic Acid Test Results Lab Results      Component                Value               Date                      [place]              NEGATIVE            01/09/2020                [place]              NEGATIVE            12/13/2019                [place]              NEGATIVE            11/13/2019                [place]              NEGATIVE            10/14/2019                [place]              NEGATIVE            09/21/2019              breath sounds clear to auscultation       Cardiovascular hypertension, Pt. on medications and Pt. on home beta blockers + angina + CAD, + Past MI and +CHF   Rhythm:Regular  2015:  Left ventricle: The cavity size was normal. Wall thickness  was increased in a pattern of moderate LVH. Systolic  function was normal. The estimated ejection fraction was  in the range of 55% to 60%. There is hypokinesis of the  basal-midinferolateral myocardium. Doppler parameters are  consistent with abnormal left ventricular relaxation  (grade 1 diastolic dysfunction).  - Aortic valve: Trivial regurgitation.  - Mitral valve: Calcified annulus. Trivial regurgitation.  - Right atrium: Central venous pressure: 18mm Hg (est).  - Tricuspid valve: Trivial regurgitation.  - Pulmonary arteries: PA peak pressure: 56mm Hg (S).  - Pericardium, extracardiac: There was no pericardial  effusion.     The left ventricular ejection  fraction is mildly decreased (45-54%).  Nuclear stress EF: 51%.  There was no ST segment deviation noted during stress.  Defect 1: There is a medium defect of moderate severity present in the basal inferolateral, mid inferolateral and apical lateral location.  Defect 2: There is a medium defect of mild severity present in the basal inferior, mid inferior and apical inferior location.  Defect 3: There is a small defect of mild severity present in the apex location.  Findings consistent with prior myocardial infarction.  This is a low  risk study.   1. Fixed inferior/inferolateral defect.  Consistent with prior infarct.  Consider echocardiogram to confirm wall motion abnormality 2. No ischemia 3. Low risk study    Neuro/Psych  Headaches, PSYCHIATRIC DISORDERS Anxiety Depression  Neuromuscular disease    GI/Hepatic Neg liver ROS, GERD  Medicated and Controlled,  Endo/Other  diabetes, Insulin Dependent  Renal/GU Renal disease     Musculoskeletal  (+) Arthritis ,   Abdominal   Peds  Hematology Lab Results      Component                Value               Date                      WBC                      9.1                 12/25/2019                HGB                      9.8 (L)             12/25/2019                HCT                      30.8 (L)            12/25/2019                MCV                      88.3                12/25/2019                PLT                      263                 12/25/2019              Anesthesia Other Findings   Reproductive/Obstetrics                            Anesthesia Physical Anesthesia Plan  ASA: III  Anesthesia Plan: General   Post-op Pain Management:    Induction: Intravenous  PONV Risk Score and Plan: 2 and Ondansetron and Dexamethasone  Airway Management Planned: Oral ETT  Additional Equipment: None  Intra-op Plan:   Post-operative Plan: Extubation in OR  Informed Consent: I  have reviewed the patients History and Physical, chart, labs and discussed the procedure including the risks, benefits and alternatives for the proposed anesthesia with the patient or authorized representative who has indicated his/her understanding and acceptance.     Dental advisory given  Plan Discussed with: CRNA and Surgeon  Anesthesia Plan Comments:         Anesthesia Quick Evaluation

## 2019-12-28 NOTE — Anesthesia Postprocedure Evaluation (Signed)
Anesthesia Post Note  Patient: Steven Crane  Procedure(s) Performed: ESOPHAGOGASTRODUODENOSCOPY (EGD) REMOVAL OF ESOPHAGEAL STENT (N/A )     Patient location during evaluation: PACU Anesthesia Type: General Level of consciousness: patient cooperative and awake Pain management: pain level controlled Vital Signs Assessment: post-procedure vital signs reviewed and stable Respiratory status: spontaneous breathing, nonlabored ventilation, respiratory function stable and patient connected to nasal cannula oxygen Cardiovascular status: blood pressure returned to baseline and stable Postop Assessment: no apparent nausea or vomiting Anesthetic complications: no   No complications documented.  Last Vitals:  Vitals:   01/19/2020 0923 01/14/2020 1200  BP: 123/73 (!) 143/74  Pulse: 78 86  Resp: 12 20  Temp: 37.3 C 36.9 C  SpO2:  96%    Last Pain:  Vitals:   12/26/2019 1200  TempSrc: Oral  PainSc: 0-No pain                 Kemon Devincenzi

## 2019-12-28 NOTE — Anesthesia Procedure Notes (Signed)
Procedure Name: Intubation Date/Time: 01/16/2020 7:46 AM Performed by: Janace Litten, CRNA Pre-anesthesia Checklist: Patient identified, Emergency Drugs available, Suction available and Patient being monitored Patient Re-evaluated:Patient Re-evaluated prior to induction Oxygen Delivery Method: Circle System Utilized Preoxygenation: Pre-oxygenation with 100% oxygen Induction Type: IV induction Ventilation: Mask ventilation without difficulty Laryngoscope Size: Mac and 4 Grade View: Grade I Tube type: Oral Tube size: 7.5 mm Number of attempts: 1 Airway Equipment and Method: Stylet Placement Confirmation: ETT inserted through vocal cords under direct vision,  positive ETCO2 and breath sounds checked- equal and bilateral Secured at: 23 cm Tube secured with: Tape Dental Injury: Teeth and Oropharynx as per pre-operative assessment

## 2019-12-28 NOTE — Progress Notes (Signed)
     [address]       [place] [address]             [phone]       No events Continues to have hiccups afib overnight, started on amio.  Converted back to sinus  OR today for EGD, and stent removal  Lesleigh Hughson O Wynell Halberg

## 2019-12-29 ENCOUNTER — Encounter (HOSPITAL_COMMUNITY): Payer: Self-pay | Admitting: Thoracic Surgery (Cardiothoracic Vascular Surgery)

## 2019-12-29 LAB — GLUCOSE, CAPILLARY
Glucose-Capillary: 83 mg/dL (ref 70–99)
Glucose-Capillary: 197 mg/dL — ABNORMAL HIGH (ref 70–99)
Glucose-Capillary: 225 mg/dL — ABNORMAL HIGH (ref 70–99)
Glucose-Capillary: 239 mg/dL — ABNORMAL HIGH (ref 70–99)
Glucose-Capillary: 172 mg/dL — ABNORMAL HIGH (ref 70–99)
Glucose-Capillary: 142 mg/dL — ABNORMAL HIGH (ref 70–99)
Glucose-Capillary: 138 mg/dL — ABNORMAL HIGH (ref 70–99)

## 2019-12-29 MED ORDER — AMIODARONE HCL IN DEXTROSE 360-4.14 MG/200ML-% IV SOLN: 30.0000 mg/h | INTRAVENOUS | Status: DC

## 2019-12-29 MED ORDER — METOPROLOL TARTRATE 5 MG/5ML IV SOLN
2.5000 mg | INTRAVENOUS | Status: AC | PRN
  Administered 2019-12-29 – 2020-01-05 (×3): 2.5 mg via INTRAVENOUS
  Filled 2019-12-29: qty 5

## 2019-12-29 MED ORDER — INSULIN ASPART 100 UNIT/ML ~~LOC~~ SOLN
0.0000 [IU] | SUBCUTANEOUS | Status: DC
  Administered 2019-12-29 (×2): 3 [IU] via SUBCUTANEOUS
  Administered 2019-12-29: 4 [IU] via SUBCUTANEOUS
  Administered 2019-12-30: 3 [IU] via SUBCUTANEOUS
  Administered 2019-12-30: 11 [IU] via SUBCUTANEOUS
  Administered 2019-12-30: 7 [IU] via SUBCUTANEOUS
  Administered 2019-12-30: 3 [IU] via SUBCUTANEOUS
  Administered 2019-12-30: 4 [IU] via SUBCUTANEOUS
  Administered 2019-12-30: 3 [IU] via SUBCUTANEOUS
  Administered 2019-12-31: 7 [IU] via SUBCUTANEOUS
  Administered 2019-12-31 (×2): 4 [IU] via SUBCUTANEOUS
  Administered 2019-12-31 (×2): 11 [IU] via SUBCUTANEOUS
  Administered 2020-01-01 (×2): 4 [IU] via SUBCUTANEOUS
  Administered 2020-01-01: 11 [IU] via SUBCUTANEOUS
  Administered 2020-01-01: 4 [IU] via SUBCUTANEOUS
  Administered 2020-01-01: 7 [IU] via SUBCUTANEOUS
  Administered 2020-01-02: 4 [IU] via SUBCUTANEOUS
  Administered 2020-01-02: 7 [IU] via SUBCUTANEOUS
  Administered 2020-01-02: 11 [IU] via SUBCUTANEOUS
  Administered 2020-01-02 (×4): 4 [IU] via SUBCUTANEOUS
  Administered 2020-01-03: 3 [IU] via SUBCUTANEOUS
  Administered 2020-01-03: 4 [IU] via SUBCUTANEOUS
  Administered 2020-01-03 (×2): 7 [IU] via SUBCUTANEOUS
  Administered 2020-01-04 (×2): 4 [IU] via SUBCUTANEOUS
  Administered 2020-01-04: 3 [IU] via SUBCUTANEOUS
  Administered 2020-01-04: 7 [IU] via SUBCUTANEOUS
  Administered 2020-01-05 (×2): 3 [IU] via SUBCUTANEOUS
  Administered 2020-01-05: 4 [IU] via SUBCUTANEOUS
  Administered 2020-01-05: 11 [IU] via SUBCUTANEOUS
  Administered 2020-01-05 – 2020-01-06 (×3): 4 [IU] via SUBCUTANEOUS
  Administered 2020-01-06: 3 [IU] via SUBCUTANEOUS
  Administered 2020-01-06: 4 [IU] via SUBCUTANEOUS
  Administered 2020-01-06: 3 [IU] via SUBCUTANEOUS

## 2019-12-29 MED ORDER — ZINC OXIDE 40 % EX OINT
TOPICAL_OINTMENT | Freq: Two times a day (BID) | CUTANEOUS | Status: DC
  Administered 2019-12-29 – 2020-01-05 (×5): 1 via TOPICAL
  Filled 2019-12-29 (×2): qty 57

## 2019-12-29 MED ORDER — FREE WATER
150.0000 mL | Status: DC
  Administered 2019-12-29 – 2020-01-03 (×29): 150 mL

## 2019-12-29 NOTE — Progress Notes (Addendum)
[address]       [place] [address]             [phone]      1 Day Post-Op Procedure(s) (LRB): ESOPHAGOGASTRODUODENOSCOPY (EGD) REMOVAL OF ESOPHAGEAL STENT (N/A) Subjective: Awake and alert, currently  NPO, TF infusing at 68ml/hr. IV amiodarone infusing.  Objective: Vital signs in last 24 hours: Temp:  [98.4 F (36.9 C)-100.7 F (38.2 C)] 98.7 F (37.1 C) (09/08 0749) Pulse Rate:  [74-98] 81 (09/08 0749) Cardiac Rhythm: Normal sinus rhythm (09/08 0705) Resp:  [12-31] 20 (09/08 0749) BP: (117-157)/(73-82) 128/73 (09/08 0749) SpO2:  [90 %-100 %] 97 % (09/08 0749) Weight:  [92.6 kg] 92.6 kg (09/08 0308)     Intake/Output from previous day: 09/07 0701 - 09/08 0700 In: 1356.2 [I.V.:1330.2] Out: 600 [Urine:600] Intake/Output this shift: No intake/output data recorded.  General appearance: alert, cooperative and no distress Neurologic: intact Heart: NSR, no recurrent a-fib on monitor review. Lungs: breath sounds are clear. Abdomen: soft, NT Extremities: warm and well perfused, no edema Wound: There is some leakage around the G-tube and associated skin maceration.   Lab Results: No results for input(s): WBC, HGB, HCT, PLT in the last 72 hours. BMET:  Recent Labs    12/27/19 0215  NA 130*  K 4.0  CL 96*  CO2 30  GLUCOSE 297*  BUN 9  CREATININE 0.45*  CALCIUM 7.7*    PT/INR: No results for input(s): LABPROT, INR in the last 72 hours. ABG    Component Value Date/Time   PHART 7.462 (H) 10/19/2019 0510   HCO3 23.7 10/19/2019 0510   TCO2 26 10/18/2019 1640   ACIDBASEDEF 3.9 (H) 10/18/2019 1809   O2SAT 98.4 10/19/2019 0510   CBG (last 3)  Recent Labs    12/29/19 0312 12/29/19 0642 12/29/19 0755  GLUCAP 197* 225* 239*   CLINICAL DATA:  Esophageal perforation, status post esophageal stent remove earlier today. Prior partial esophagectomy and gastric pull-through.  EXAM: ESOPHOGRAM/BARIUM SWALLOW  TECHNIQUE: Single  contrast examination was performed using water-soluble contrast.  FLUOROSCOPY TIME:  Fluoroscopy Time:  3 minutes and 36 seconds  Radiation Exposure Index (if provided by the fluoroscopic device): 68.2 mGy  Number of Acquired Spot Images: 0  COMPARISON:  CTs the chest abdomen and pelvis of 12/20/2019. A CT after esophagram of 11/14/2019 is also reviewed.  FINDINGS: Focused, single-contrast exam performed with the patient initially in supine position. Subsequently, images were performed with the patient upright.  Patient status post partial esophagectomy and gastric pull-through. There is contrast filling of the gastric pull-through, right of midline. With multiple obliquities, no anterior or left-sided contrast is seen to correspond to the ventral perforation on the 11/14/2019 CT. No obstruction of contrast passage.  IMPRESSION: Status post a partial esophagectomy and gastric pull-through. Focused, single-contrast exam performed with postoperative anatomy. No convincing evidence of residual perforation, including at the site of anterior perforation on 11/14/2019 CT. If high ongoing clinical concern, given difficulty in distending the gastric pull-through, repeat chest CT could be performed.   Electronically Signed   By: Jeronimo Greaves M.D.   On: 01/02/2020 16:00  Assessment/Plan: S/P Procedure(s) (LRB): ESOPHAGOGASTRODUODENOSCOPY (EGD) REMOVAL OF ESOPHAGEAL STENT (N/A)  -S/P Ivor-Lewis esophagectomy for stage III cancer with late anastomotic leak treated with an esophageal stent. POD-1 esophagoscopy with stent removal. significant mucosal swelling noted adjacent to where stent had been deployed. Esophogram yesterday  demonstrated no evidence of perforation.  Hiccups have resolved. He  remains on nutritional support via G-tube. Diet mgt per Dr. Cliffton Asters. Wound care nurse consult to assist with managing the skin irritation at the G-tube insertion site.   -Type 2 DM-  Will change the SSI to q4h (from AC/HS) since he is currently on continuous TF.   -Atrial fibrillation- maintaining SR on amiodarone infusion. Will convert to oral amio when PO's allowed.   LOS: 5 days    Leary Roca, [place] [phone] 12/29/2019  Agree with above. We will start clear liquid diet We will hold amiodarone for now and continue with IV metoprolol. Patient has not yet been cleared for pills We will need to work with speech therapy to assist with swallowing.  Moria Brophy Keane Scrape

## 2019-12-29 NOTE — Progress Notes (Signed)
Nutrition Follow-up  DOCUMENTATION CODES:   Not applicable  INTERVENTION:  Continue tube feeding via J-tube: - Increase Osmolite 1.5 to 65 ml/hr (1560 ml/day) - ProSource TF 45 ml TID - Free water flushes of 150 ml q 4 hours  Tube feeding regimen provides 2460 kcal, 131 grams of protein, and 2089 ml of H2O (meets 98% of kcal needs and 100% of protein needs).  - RD will monitor for ability to advance tube feeds to goal of 70 ml/hr  NUTRITION DIAGNOSIS:   Moderate Malnutrition related to chronic illness (esophageal cancer) as evidenced by moderate fat depletion, moderate muscle depletion, percent weight loss.  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  9/07 - s/p removal of esophageal stent  Diet advanced back to clear liquids this AM.  Spoke with RN prior to meeting with pt. RN confirms pt not allowing TF to be increased above 60 ml/hr.  Spoke with pt at length at bedside. Pt reports that when TF increased above 60 ml/hr, he feels like the tube feeding is "backing up" and that he burps and tastes the tube feeding. Discussed option of Reglan with MD; however, per MD Reglan was tried previously with no  benefit.  Discussed pt's weight loss and malnutrition with him. Pt with a 43.5 kg weight loss since February 2021. This is a 34.5% weight loss in 7 months which is severe and significant for timeframe. Pt reports that he knows he needs to go up on his tube feeding but that he is worried about what will happen. Discussed timed trial of increasing tube feeds to 65 ml/hr. Pt willing to try this.  RD returned to pt's room later in the afternoon to assess for tolerance of tube feeds at 65 ml/hr. Pt reports that he has had some "tightness" in his abdomen but denies any regurgitation of tube feeds similar to what he has reported in the past. He is willing to continue his tube feeds at 65 ml/hr now.  Pt also reports drainage from G-J tube site. WOC consulted. Per WOC note, "topical treatment will not be effective to control leakage. This would require tube replacement."  RD placed pt's clear liquid diet order for lunch meal tray.  Current TF: Osmolite 1.5 @ 60 ml/hr (goal rate is 70 ml/hr), ProSource TF 45 ml TID, free water 200 ml q 4 hours  Medications reviewed and include: SSI q 4 hours, Novolog 6 units with meals, Levemir 25 units daily, protonix, amiodarone  Labs reviewed: sodium 130 (on 9/06), hemoglobin 9.8  UOP: 600 ml x 24 hours I/O's: +2.4 L since admit  NUTRITION - FOCUSED PHYSICAL EXAM:    Most Recent Value  Orbital Region Moderate depletion  Upper Arm Region Moderate depletion  Thoracic and Lumbar Region Moderate depletion  Buccal Region Moderate depletion  Temple  Region Moderate depletion  Clavicle Bone Region Moderate depletion  Clavicle and Acromion Bone Region Moderate depletion  Scapular Bone Region Moderate depletion  Dorsal Hand Moderate depletion  Patellar Region Moderate depletion  Anterior Thigh Region Moderate depletion  Posterior Calf Region Moderate depletion  Edema (RD Assessment) None  Hair Reviewed  Eyes Reviewed  Mouth Reviewed  Skin Reviewed  Nails  Reviewed       Diet Order:   Diet Order            Diet clear liquid Room service appropriate? Yes; Fluid consistency: Thin  Diet effective now                 EDUCATION NEEDS:   Education needs have been addressed  Skin:  Skin Assessment: Skin Integrity Issues: Other: skin irritation at G-J tube site  Last BM:  12/27/19  Height:   Ht Readings from Last 1 Encounters:  12/25/19 6\' 2"  (1.88 m)    Weight:   Wt Readings from Last 1 Encounters:  12/29/19 92.6 kg    Ideal Body Weight:  86.3 kg  BMI:  Body mass index is 26.21 kg/m.  Estimated Nutritional Needs:   Kcal:  2500-2800kcal/day  Protein:  125-140g/day  Fluid:  2.6-2.9L/day    Gaynell Face, MS, RD, LDN Inpatient Clinical Dietitian Please see AMiON for contact information.

## 2019-12-29 NOTE — Progress Notes (Signed)
   12/29/19 1400  Clinical Encounter Type  Visited With Patient  Visit Type Initial  Referral From Nurse  Consult/Referral To [facility] responded to spiritual care consult for AD. Patient did not request the AD; however said he would look it over with his son. Chaplain left the AD paperwork with the patient. Patient requested prayer. Chaplain provided prayer and spiritual care. Patient shared a bit of medical history and wanted prayer for healing and recovery. Chaplain will follow up as needed.

## 2019-12-29 NOTE — Consult Note (Signed)
[facility] Nurse Consult Note: Reason for Consult: Wound around feeding tube site.  Wound type: partial thickness, maceration Measurement: 8.2 cm x 3 cm  Wound bed: Red and painful Drainage (amount, consistency, odor) area is leaking a lot amount of green drainage from G tube insertion site. Topical treatment will not be effective to control leakage. This would require tube replacement Dressing procedure/placement/frequency: Apply desitin cream to surrounding area around Gtube and PRN when soiled, then place split drawtex Kellie Simmering # [phone]) around tube and secure with tape.   Monitor the wound area(s) for worsening of condition such as: Signs/symptoms of infection,  Increase in size,  Development of or worsening of odor, Development of pain, or increased pain at the affected locations.  Notify the medical team if any of these develop.  Thank you for the consult.  Discussed plan of care with the patient and bedside nurse.  [place] nurse will not follow at this time.  Please re-consult the [facility] team if needed.  Cathlean Marseilles Tamala Julian, MSN, RN, CMSRN, [place], [facility] Wound Treatment Associate Wound, Ostomy, Continence Nurse Office [phone]

## 2019-12-29 NOTE — Plan of Care (Signed)

## 2019-12-30 ENCOUNTER — Inpatient Hospital Stay (HOSPITAL_COMMUNITY): Payer: BC Managed Care – PPO

## 2019-12-30 LAB — GLUCOSE, CAPILLARY
Glucose-Capillary: 107 mg/dL — ABNORMAL HIGH (ref 70–99)
Glucose-Capillary: 121 mg/dL — ABNORMAL HIGH (ref 70–99)
Glucose-Capillary: 137 mg/dL — ABNORMAL HIGH (ref 70–99)
Glucose-Capillary: 149 mg/dL — ABNORMAL HIGH (ref 70–99)
Glucose-Capillary: 155 mg/dL — ABNORMAL HIGH (ref 70–99)
Glucose-Capillary: 225 mg/dL — ABNORMAL HIGH (ref 70–99)
Glucose-Capillary: 255 mg/dL — ABNORMAL HIGH (ref 70–99)

## 2019-12-30 LAB — CBC
HCT: 28 % — ABNORMAL LOW (ref 39.0–52.0)
Hemoglobin: 9.2 g/dL — ABNORMAL LOW (ref 13.0–17.0)
MCH: 28.9 pg (ref 26.0–34.0)
MCHC: 32.9 g/dL (ref 30.0–36.0)
MCV: 88.1 fL (ref 80.0–100.0)
Platelets: 299 10*3/uL (ref 150–400)
RBC: 3.18 MIL/uL — ABNORMAL LOW (ref 4.22–5.81)
RDW: 14.2 % (ref 11.5–15.5)
WBC: 7.1 10*3/uL (ref 4.0–10.5)
nRBC: 0 % (ref 0.0–0.2)

## 2019-12-30 NOTE — Hospital Course (Addendum)
History of Present Illness:  Steven Crane is a 59 yo male well known to [facility].  He underwent Robotic Assisted Ivor Lewis Esophagectomy in June of this year.  His hospital course was uncomplicated and he was discharged after 6 days in the hospital.  Unfortunately about a month later he developed complaints of increased bloating with vomiting x 1.  He also noticed increased epigastric and back pain with ambulation.  CT of the chest was obtained and showed evidence of a contained leak just below the carina.  He was admitted to the hospital, start on ABX and tube feedings were resumed.  Swallow study was obtained and showed stable appearance of esophageal leak.  He was discharged home on tube feedings with plan for repeat swallow as an outpatient. Follow up with Steven Crane showed on review of swallow study his leak was somewhat smaller but overall remained present.  It was felt he should undergo EGD with placement of stent over his esophageal leak.  This was done on 8/24.  The patient tolerated the procedure and was discharged home in stable condition.  He again presented to Steven Crane for scheduled follow up on 01/10/2020.  At that visit the patient had been complaining of intractable hiccups for the past 2 weeks.  He was also taking little to no oral intake. It was felt he was failing at home and hospital admission was indicated.   Hospital Course:    Patient was admitted directly from office.  He was evaluated by dietician who made adjustments to his tube feeding regimen.  His hiccups persisted.  He developed Atrial Fibrillation and was treated with IV Amiodarone.  He converted to NSR.  He was taken back to the operating room on 01/08/2020.  He underwent EGD with removal on previously placed stent. Follow up Esophogram showed no evidence of perforation/leak.  He was started on a clear liquid diet, however patient again developed epigastric discomfort. Speech pathology was consulted to assist with managing his  swallow function. He continued to have some discomfort with eating but we tried to transition to cycled nocturnal feeds to encourage more PO intake during the day. We did a repeat swallow study on 01/04/2020 which showed ***. We slowly increased his tube feed rate until he meets his goal.

## 2019-12-30 NOTE — Plan of Care (Signed)
  Problem: Education: Goal: Knowledge of General Education information will improve Description: Including pain rating scale, medication(s)/side effects and non-pharmacologic comfort measures Outcome: Progressing   Problem: Health Behavior/Discharge Planning: Goal: Ability to manage health-related needs will improve Outcome: Progressing   Problem: Clinical Measurements: Goal: Ability to maintain clinical measurements within normal limits will improve Outcome: Progressing Goal: Will remain free from infection Outcome: Progressing Goal: Diagnostic test results will improve Outcome: Progressing Goal: Respiratory complications will improve Outcome: Progressing Goal: Cardiovascular complication will be avoided Outcome: Progressing   Problem: Nutrition: Goal: Adequate nutrition will be maintained Outcome: Progressing   Problem: Coping: Goal: Level of anxiety will decrease Outcome: Progressing   Problem: Elimination: Goal: Will not experience complications related to bowel motility Outcome: Progressing Goal: Will not experience complications related to urinary retention Outcome: Progressing   Problem: Pain Managment: Goal: General experience of comfort will improve Outcome: Progressing   Problem: Safety: Goal: Ability to remain free from injury will improve Outcome: Progressing   Problem: Skin Integrity: Goal: Risk for impaired skin integrity will decrease Outcome: Progressing   Problem: Skin Integrity: Goal: Risk for impaired skin integrity will decrease Outcome: Progressing   Problem: Clinical Measurements: Goal: Postoperative complications will be avoided or minimized Outcome: Progressing   Problem: Respiratory: Goal: Ability to maintain a clear airway will improve Outcome: Progressing   Problem: Skin Integrity: Goal: Demonstration of wound healing without infection will improve Outcome: Progressing

## 2019-12-30 NOTE — Progress Notes (Addendum)
      [address]       [place] [address]             [phone]      2 Days Post-Op Procedure(s) (LRB): ESOPHAGOGASTRODUODENOSCOPY (EGD) REMOVAL OF ESOPHAGEAL STENT (N/A) Subjective: Awake and alert,  C/o feeling "stopped up" in his chest".TF infusing I  Objective: Vital signs in last 24 hours: Temp:  [98.5 F (36.9 C)-99.2 F (37.3 C)] 98.6 F (37 C) (09/09 0339) Pulse Rate:  [63-107] 74 (09/09 0339) Cardiac Rhythm: Normal sinus rhythm (09/09 0703) Resp:  [15-22] 20 (09/09 0339) BP: (124-147)/(69-91) 147/81 (09/09 0339) SpO2:  [95 %-100 %] 98 % (09/09 0339) Weight:  [98 kg] 98 kg (09/09 0348)     Intake/Output from previous day: 09/08 0701 - 09/09 0700 In: 535 [I.V.:40; NG/GT:495] Out: 1000 [Urine:1000] Intake/Output this shift: No intake/output data recorded.  General appearance: alert, cooperative and moderate distress Neurologic: intact Heart: remains in SR Lungs: breath sounds are clear. Abdomen: soft, mild epigastric tenderness, active bowel sounds Extremities: warm and well perfused, no edema Wound: wound care, dressings applied to G-tube site per wound care RN rec's. Appreciate consult.   Lab Results: Recent Labs    12/30/19 0348  WBC 7.1  HGB 9.2*  HCT 28.0*  PLT 299   BMET:  No results for input(s): NA, K, CL, CO2, GLUCOSE, BUN, CREATININE, CALCIUM in the last 72 hours.  PT/INR: No results for input(s): LABPROT, INR in the last 72 hours. ABG    Component Value Date/Time   PHART 7.462 (H) 10/19/2019 0510   HCO3 23.7 10/19/2019 0510   TCO2 26 10/18/2019 1640   ACIDBASEDEF 3.9 (H) 10/18/2019 1809   O2SAT 98.4 10/19/2019 0510   CBG (last 3)  Recent Labs    12/29/19 1938 12/30/19 0000 12/30/19 0338  GLUCAP 138* 121* 149*      Assessment/Plan: S/P Procedure(s) (LRB): ESOPHAGOGASTRODUODENOSCOPY (EGD) REMOVAL OF ESOPHAGEAL STENT (N/A)  -S/P Ivor-Lewis esophagectomy for stage III cancer with late anastomotic leak  treated with an esophageal stent. POD-2 esophagoscopy with stent removal. significant mucosal swelling noted adjacent to where stent had been deployed. Esophogram 9/7  demonstrated no evidence of perforation.  Complains of new chest discomfort with epigastric fullness this morning after re-starting clear liquids yesterday. Will check CXR this AM, He remains on nutritional support via J-tube.   -Type 2 DM- Glucose control adequate on Levemir and SSI.   -Atrial fibrillation- maintaining SR, off amiodarone. Continue to monitor.     LOS: 6 days    Antony Odea, [place] [phone] 12/30/2019   Agree with above. Patient continues to have dysphagia nausea with certain meals. For increased his Protonix to twice daily And advance his diet to something a little bit more substantial as opposed to the clear liquids. Of also added Hycet for pain control. We will continue to work with nutrition.  Anajah Sterbenz Bary Leriche

## 2019-12-30 NOTE — Progress Notes (Signed)
Brief Nutrition Note  Spoke with pt at bedside. Pt has now been on tube feeds at 65 ml/hr for 24 hours. Pt denies any issues related to tube feeds but reports experiencing chest tightness overnight and "vomiting" after breakfast this morning. He thinks he may have "overdone it" with breakfast (had coffee, grape juice, orange jello, some sweet tea). Pt states that when he "spits up" he tastes the orange jello. He thinks he may be having acid reflux. Pt has discussed this issue with PA and chest x-ray obtained and no acute abnormality noted per reading. Pt reports that he is likely not going to eat/drink anything for lunch and dinner today to give himself a break.  Discussed pt with RN.   Gaynell Face, MS, RD, LDN Inpatient Clinical Dietitian Please see AMiON for contact information.

## 2019-12-31 ENCOUNTER — Encounter: Payer: Self-pay | Admitting: Thoracic Surgery (Cardiothoracic Vascular Surgery)

## 2019-12-31 LAB — GLUCOSE, CAPILLARY
Glucose-Capillary: 153 mg/dL — ABNORMAL HIGH (ref 70–99)
Glucose-Capillary: 178 mg/dL — ABNORMAL HIGH (ref 70–99)
Glucose-Capillary: 182 mg/dL — ABNORMAL HIGH (ref 70–99)
Glucose-Capillary: 250 mg/dL — ABNORMAL HIGH (ref 70–99)
Glucose-Capillary: 261 mg/dL — ABNORMAL HIGH (ref 70–99)
Glucose-Capillary: 265 mg/dL — ABNORMAL HIGH (ref 70–99)

## 2019-12-31 LAB — CBC
HCT: 30.2 % — ABNORMAL LOW (ref 39.0–52.0)
Hemoglobin: 9.5 g/dL — ABNORMAL LOW (ref 13.0–17.0)
MCH: 27.3 pg (ref 26.0–34.0)
MCHC: 31.5 g/dL (ref 30.0–36.0)
MCV: 86.8 fL (ref 80.0–100.0)
Platelets: 327 10*3/uL (ref 150–400)
RBC: 3.48 MIL/uL — ABNORMAL LOW (ref 4.22–5.81)
RDW: 14.2 % (ref 11.5–15.5)
WBC: 9.4 10*3/uL (ref 4.0–10.5)
nRBC: 0 % (ref 0.0–0.2)

## 2019-12-31 LAB — BASIC METABOLIC PANEL
Anion gap: 7 (ref 5–15)
BUN: 7 mg/dL (ref 6–20)
CO2: 26 mmol/L (ref 22–32)
Calcium: 7.6 mg/dL — ABNORMAL LOW (ref 8.9–10.3)
Chloride: 96 mmol/L — ABNORMAL LOW (ref 98–111)
Creatinine, Ser: 0.44 mg/dL — ABNORMAL LOW (ref 0.61–1.24)
GFR calc Af Amer: >60 mL/min (ref >60)
GFR calc non Af Amer: >60 mL/min (ref >60)
Glucose, Bld: 205 mg/dL — ABNORMAL HIGH (ref 70–99)
Potassium: 4.3 mmol/L (ref 3.5–5.1)
Sodium: 129 mmol/L — ABNORMAL LOW (ref 135–145)

## 2019-12-31 MED ORDER — HYDROCODONE-ACETAMINOPHEN 7.5-325 MG/15ML PO SOLN
10.0000 mL | Freq: Four times a day (QID) | ORAL | Status: DC | PRN
  Administered 2019-12-31 – 2020-01-04 (×11): 10 mL via ORAL
  Filled 2019-12-31 (×16): qty 15

## 2019-12-31 MED ORDER — METOCLOPRAMIDE HCL 5 MG/5ML PO SOLN
10.0000 mg | Freq: Three times a day (TID) | ORAL | Status: DC
  Administered 2019-12-31 – 2020-01-06 (×25): 10 mg via ORAL
  Filled 2019-12-31 (×28): qty 10

## 2019-12-31 MED ORDER — PANTOPRAZOLE SODIUM 40 MG PO PACK
40.0000 mg | PACK | Freq: Two times a day (BID) | ORAL | Status: DC
  Administered 2019-12-31 – 2020-01-06 (×14): 40 mg
  Filled 2019-12-31 (×14): qty 20

## 2019-12-31 NOTE — Evaluation (Signed)
Physical Therapy Evaluation Patient Details Name: Steven Crane MRN: [id number] DOB: May 27, 1960 Today's Date: 12/31/2019   History of Present Illness  59 yo admitted for esophageal stent removal on 9//7. Pt had esophagectomy 10/18/19 for esophageal CA with post op leak with stent placed 8/24. PMhx: CAD, obesity, HTN, DM, HLD, arthritis, anxiety, AFib  Clinical Impression  Pt sitting in bed on arrival reporting back pain today. Pt normally sleeps in recliner and does not walk with RW. Per pt since he has had such limited food consumption he has experienced fatigue and lack of mobility at home. Pt educated for walking program and will benefit from HEP prior to D/C. Pt agreed to benefit of acute therapy to maximize strength and function with education for home program. Pt reports he will increase activity at D/C and does not feel HHPT is warranted. Pt with decreased strength and activity tolerance who will benefit from acute therapy to maximize independence.   Hr 91 SpO2 96% on RA    Follow Up Recommendations No PT follow up    Equipment Recommendations  None recommended by PT    Recommendations for Other Services       Precautions / Restrictions Precautions Precautions: Fall      Mobility  Bed Mobility Overal bed mobility: Modified Independent             General bed mobility comments: increased time with HOB 35 degrees to exit and enter bed  Transfers Overall transfer level: Modified independent                  Ambulation/Gait Ambulation/Gait assistance: Supervision Gait Distance (Feet): 400 Feet Assistive device: Rolling walker (2 wheeled) Gait Pattern/deviations: Step-through pattern;Decreased stride length;Trunk flexed   Gait velocity interpretation: >2.62 ft/sec, indicative of community ambulatory General Gait Details: pt with good stability with use of RW with cues for posture and direction  Stairs            Wheelchair Mobility    Modified  Rankin (Stroke Patients Only)       Balance Overall balance assessment: Mild deficits observed, not formally tested                                           Pertinent Vitals/Pain Pain Assessment: 0-10 Pain Score: 5  Pain Location: back Pain Descriptors / Indicators: Aching;Constant Pain Intervention(s): Limited activity within patient's tolerance;Monitored during session;Repositioned    Home Living Family/patient expects to be discharged to:: Private residence Living Arrangements: Children;Spouse/significant other Available Help at Discharge: Family;Available 24 hours/day Type of Home: Mobile home Home Access: Ramped entrance     Home Layout: One level Home Equipment: Emergency planning/management officer - 2 wheels      Prior Function Level of Independence: Independent with assistive device(s)         Comments: occasional RW use     Hand Dominance        Extremity/Trunk Assessment   Upper Extremity Assessment Upper Extremity Assessment: Generalized weakness    Lower Extremity Assessment Lower Extremity Assessment: Generalized weakness    Cervical / Trunk Assessment Cervical / Trunk Assessment: Kyphotic  Communication   Communication: No difficulties  Cognition Arousal/Alertness: Awake/alert Behavior During Therapy: WFL for tasks assessed/performed Overall Cognitive Status: Within Functional Limits for tasks assessed  General Comments      Exercises     Assessment/Plan    PT Assessment Patient needs continued PT services  PT Problem List Decreased activity tolerance;Decreased mobility       PT Treatment Interventions Gait training;Functional mobility training;Therapeutic activities;Patient/family education;Therapeutic exercise    PT Goals (Current goals can be found in the Care Plan section)  Acute Rehab PT Goals Patient Stated Goal: return home, eat PT Goal Formulation: With  patient Time For Goal Achievement: 01/14/20 Potential to Achieve Goals: Good    Frequency Min 3X/week   Barriers to discharge        Co-evaluation               AM-PAC PT "6 Clicks" Mobility  Outcome Measure Help needed turning from your back to your side while in a flat bed without using bedrails?: A Little Help needed moving from lying on your back to sitting on the side of a flat bed without using bedrails?: None Help needed moving to and from a bed to a chair (including a wheelchair)?: None Help needed standing up from a chair using your arms (e.g., wheelchair or bedside chair)?: None Help needed to walk in hospital room?: A Little Help needed climbing 3-5 steps with a railing? : A Little 6 Click Score: 21    End of Session   Activity Tolerance: Patient tolerated treatment well Patient left: in bed;with call bell/phone within reach Nurse Communication: Mobility status PT Visit Diagnosis: Other abnormalities of gait and mobility (R26.89)    Time: 4098-1191 PT Time Calculation (min) (ACUTE ONLY): 23 min   Charges:   PT Evaluation $PT Eval Moderate Complexity: 1 Mod          Nyashia Raney P, PT Acute Rehabilitation Services Pager: [phone] Office: [phone]   Bard Haupert B Harlan Ervine 12/31/2019, 11:31 AM

## 2020-01-01 LAB — GLUCOSE, CAPILLARY
Glucose-Capillary: 186 mg/dL — ABNORMAL HIGH (ref 70–99)
Glucose-Capillary: 288 mg/dL — ABNORMAL HIGH (ref 70–99)
Glucose-Capillary: 230 mg/dL — ABNORMAL HIGH (ref 70–99)
Glucose-Capillary: 166 mg/dL — ABNORMAL HIGH (ref 70–99)

## 2020-01-01 NOTE — Progress Notes (Signed)
      [address]       ,[place] [address]             [phone]      4 Days Post-Op Procedure(s) (LRB): ESOPHAGOGASTRODUODENOSCOPY (EGD) REMOVAL OF ESOPHAGEAL STENT (N/A) Subjective: Still not tolerating much PO   Objective: Vital signs in last 24 hours: Temp:  [98.8 F (37.1 C)-100.1 F (37.8 C)] 98.8 F (37.1 C) (09/11 0744) Pulse Rate:  [82-94] 93 (09/11 0406) Cardiac Rhythm: Normal sinus rhythm (09/11 0745) Resp:  [18-22] 18 (09/11 0406) BP: (124-148)/(72-76) 146/76 (09/11 0341) SpO2:  [93 %-97 %] 94 % (09/11 0406) Weight:  [98.8 kg] 98.8 kg (09/11 0609)     Intake/Output from previous day: 09/10 0701 - 09/11 0700 In: 3539.7 [NG/GT:3539.7] Out: 1200 [Urine:1200] Intake/Output this shift: Total I/O In: 410 [NG/GT:410] Out: -   General appearance: alert, cooperative and moderate distress Neurologic: intact Heart: remains in SR Lungs: breath sounds are clear. Abdomen: soft, mild epigastric tenderness, active bowel sounds Extremities: warm and well perfused, no edema Wound: wound care, dressings applied to J-tube site per wound care RN rec's. Appreciate consult.   Lab Results: Recent Labs    12/30/19 0348 12/31/19 0400  WBC 7.1 9.4  HGB 9.2* 9.5*  HCT 28.0* 30.2*  PLT 299 327   BMET:  Recent Labs    12/31/19 0400  NA 129*  K 4.3  CL 96*  CO2 26  GLUCOSE 205*  BUN 7  CREATININE 0.44*  CALCIUM 7.6*    PT/INR: No results for input(s): LABPROT, INR in the last 72 hours. ABG    Component Value Date/Time   PHART 7.462 (H) 10/19/2019 0510   HCO3 23.7 10/19/2019 0510   TCO2 26 10/18/2019 1640   ACIDBASEDEF 3.9 (H) 10/18/2019 1809   O2SAT 98.4 10/19/2019 0510   CBG (last 3)  Recent Labs    12/31/19 1955 12/31/19 2332 01/01/20 0337  GLUCAP 153* 182* 186*      Assessment/Plan: S/P Procedure(s) (LRB): ESOPHAGOGASTRODUODENOSCOPY (EGD) REMOVAL OF ESOPHAGEAL STENT (N/A)  Overall doing well.  Continues to have multiple  complaints with p.o. intake. We will continue to work with speech therapy and nutrition to find the right diet for him. Patient needs aggressive physical therapy as well Dispo planning will be dependent upon his ability to tolerate a diet and cycle his tube feeds.   Twinkle Sockwell Bary Leriche

## 2020-01-02 LAB — GLUCOSE, CAPILLARY
Glucose-Capillary: 158 mg/dL — ABNORMAL HIGH (ref 70–99)
Glucose-Capillary: 159 mg/dL — ABNORMAL HIGH (ref 70–99)
Glucose-Capillary: 165 mg/dL — ABNORMAL HIGH (ref 70–99)
Glucose-Capillary: 178 mg/dL — ABNORMAL HIGH (ref 70–99)
Glucose-Capillary: 179 mg/dL — ABNORMAL HIGH (ref 70–99)
Glucose-Capillary: 205 mg/dL — ABNORMAL HIGH (ref 70–99)
Glucose-Capillary: 294 mg/dL — ABNORMAL HIGH (ref 70–99)

## 2020-01-02 MED ORDER — WHITE PETROLATUM EX OINT: TOPICAL_OINTMENT | CUTANEOUS | Status: DC | PRN

## 2020-01-02 NOTE — Plan of Care (Signed)
  Problem: Education: Goal: Knowledge of General Education information will improve Description: Including pain rating scale, medication(s)/side effects and non-pharmacologic comfort measures Outcome: Progressing   Problem: Health Behavior/Discharge Planning: Goal: Ability to manage health-related needs will improve Outcome: Progressing   Problem: Clinical Measurements: Goal: Ability to maintain clinical measurements within normal limits will improve Outcome: Progressing Goal: Will remain free from infection Outcome: Progressing Goal: Diagnostic test results will improve Outcome: Progressing Goal: Respiratory complications will improve Outcome: Progressing Goal: Cardiovascular complication will be avoided Outcome: Progressing   Problem: Activity: Goal: Risk for activity intolerance will decrease Outcome: Progressing   Problem: Nutrition: Goal: Adequate nutrition will be maintained Outcome: Progressing   Problem: Coping: Goal: Level of anxiety will decrease Outcome: Progressing   Problem: Elimination: Goal: Will not experience complications related to bowel motility Outcome: Progressing Goal: Will not experience complications related to urinary retention Outcome: Progressing   Problem: Pain Managment: Goal: General experience of comfort will improve Outcome: Progressing   Problem: Safety: Goal: Ability to remain free from injury will improve Outcome: Progressing   Problem: Skin Integrity: Goal: Risk for impaired skin integrity will decrease Outcome: Progressing   Problem: Education: Goal: Ability to describe self-care measures that may prevent or decrease complications (Diabetes Survival Skills Education) will improve Outcome: Progressing Goal: Individualized Educational Video(s) Outcome: Progressing   Problem: Coping: Goal: Ability to adjust to condition or change in health will improve Outcome: Progressing   Problem: Fluid Volume: Goal: Ability to  maintain a balanced intake and output will improve Outcome: Progressing   Problem: Health Behavior/Discharge Planning: Goal: Ability to identify and utilize available resources and services will improve Outcome: Progressing Goal: Ability to manage health-related needs will improve Outcome: Progressing   Problem: Metabolic: Goal: Ability to maintain appropriate glucose levels will improve Outcome: Progressing   Problem: Nutritional: Goal: Maintenance of adequate nutrition will improve Outcome: Progressing Goal: Progress toward achieving an optimal weight will improve Outcome: Progressing   Problem: Skin Integrity: Goal: Risk for impaired skin integrity will decrease Outcome: Progressing   Problem: Tissue Perfusion: Goal: Adequacy of tissue perfusion will improve Outcome: Progressing   Problem: Education: Goal: Knowledge of the prescribed therapeutic regimen will improve Outcome: Progressing   Problem: Bowel/Gastric: Goal: Gastrointestinal status for postoperative course will improve Outcome: Progressing   Problem: Nutritional: Goal: Ability to achieve adequate nutritional intake will improve Outcome: Progressing   Problem: Clinical Measurements: Goal: Postoperative complications will be avoided or minimized Outcome: Progressing   Problem: Respiratory: Goal: Ability to maintain a clear airway will improve Outcome: Progressing   Problem: Skin Integrity: Goal: Demonstration of wound healing without infection will improve Outcome: Progressing

## 2020-01-02 NOTE — Progress Notes (Signed)
      [address]       [place] [address]             [phone]      5 Days Post-Op Procedure(s) (LRB): ESOPHAGOGASTRODUODENOSCOPY (EGD) REMOVAL OF ESOPHAGEAL STENT (N/A) Subjective: Still not tolerating much PO   Objective: Vital signs in last 24 hours: Temp:  [98.2 F (36.8 C)-100.6 F (38.1 C)] 98.2 F (36.8 C) (09/12 0800) Pulse Rate:  [91-106] 106 (09/12 0800) Cardiac Rhythm: Sinus tachycardia (09/12 0700) Resp:  [18-24] 24 (09/12 0800) BP: (128-140)/(71-79) 128/79 (09/12 0800) SpO2:  [91 %-94 %] 91 % (09/12 0800) Weight:  [99.6 kg] 99.6 kg (09/12 0346)     Intake/Output from previous day: 09/11 0701 - 09/12 0700 In: 1706 [NG/GT:1706] Out: 700 [Urine:700] Intake/Output this shift: No intake/output data recorded.  General appearance: alert, cooperative and moderate distress Neurologic: intact Heart: remains in SR Lungs: breath sounds are clear. Abdomen: soft, mild epigastric tenderness, active bowel sounds Extremities: warm and well perfused, no edema Wound: wound care, dressings applied to J-tube site per wound care RN rec's. Appreciate consult.   Lab Results: Recent Labs    12/31/19 0400  WBC 9.4  HGB 9.5*  HCT 30.2*  PLT 327   BMET:  Recent Labs    12/31/19 0400  NA 129*  K 4.3  CL 96*  CO2 26  GLUCOSE 205*  BUN 7  CREATININE 0.44*  CALCIUM 7.6*    PT/INR: No results for input(s): LABPROT, INR in the last 72 hours. ABG    Component Value Date/Time   PHART 7.462 (H) 10/19/2019 0510   HCO3 23.7 10/19/2019 0510   TCO2 26 10/18/2019 1640   ACIDBASEDEF 3.9 (H) 10/18/2019 1809   O2SAT 98.4 10/19/2019 0510   CBG (last 3)  Recent Labs    01/02/20 0345 01/02/20 0731 01/02/20 1124  GLUCAP 205* 178* 294*      Assessment/Plan: S/P Procedure(s) (LRB): ESOPHAGOGASTRODUODENOSCOPY (EGD) REMOVAL OF ESOPHAGEAL STENT (N/A)  Overall doing well.  Continues to have multiple complaints with p.o. intake. We will continue  to work with speech therapy and nutrition to find the right diet for him. Patient needs aggressive physical therapy as well Dispo planning will be dependent upon his ability to tolerate a diet and cycle his tube feeds.   Steven Crane

## 2020-01-03 ENCOUNTER — Telehealth: Payer: BC Managed Care – PPO | Admitting: Family Medicine

## 2020-01-03 LAB — GLUCOSE, CAPILLARY
Glucose-Capillary: 126 mg/dL — ABNORMAL HIGH (ref 70–99)
Glucose-Capillary: 166 mg/dL — ABNORMAL HIGH (ref 70–99)
Glucose-Capillary: 168 mg/dL — ABNORMAL HIGH (ref 70–99)
Glucose-Capillary: 218 mg/dL — ABNORMAL HIGH (ref 70–99)
Glucose-Capillary: 238 mg/dL — ABNORMAL HIGH (ref 70–99)
Glucose-Capillary: 70 mg/dL (ref 70–99)

## 2020-01-03 MED ORDER — OSMOLITE 1.5 CAL PO LIQD
1120.0000 mL | ORAL | Status: DC
  Administered 2020-01-03 – 2020-01-05 (×3): 1000 mL
  Filled 2020-01-03 (×3): qty 2000

## 2020-01-03 MED ORDER — ORAL CARE MOUTH RINSE
15.0000 mL | Freq: Two times a day (BID) | OROMUCOSAL | Status: DC
  Administered 2020-01-06 (×2): 15 mL via OROMUCOSAL

## 2020-01-03 MED ORDER — FREE WATER
100.0000 mL | Status: DC
  Administered 2020-01-03 – 2020-01-06 (×18): 100 mL

## 2020-01-03 MED ORDER — CHLORHEXIDINE GLUCONATE 0.12 % MT SOLN
15.0000 mL | Freq: Two times a day (BID) | OROMUCOSAL | Status: DC
  Administered 2020-01-03 – 2020-01-06 (×7): 15 mL via OROMUCOSAL
  Filled 2020-01-03 (×6): qty 15

## 2020-01-03 NOTE — Progress Notes (Signed)
Nutrition Follow-up  DOCUMENTATION CODES:   Not applicable  INTERVENTION:   Transition to nocturnal tube feeding via J-tube: - Osmolite 1.5 at 70 ml/hr to run over 16 hours from 1600 to 0800 (total of 1120 ml) - ProSource TF 45 ml TID - Free water flushes of 100 ml q 4 hours  Nocturnal tube feeding regimen and free water flushes provide 1800 kcal, 103 grams of protein, and 1453 ml of H2O (meets 72% of kcal needs and 82% of protein needs).  - RD will monitor for ability to advance nocturnal tube feeding to goal rate of 100 ml/hr  NUTRITION DIAGNOSIS:   Moderate Malnutrition related to chronic illness (esophageal cancer) as evidenced by moderate fat depletion, moderate muscle depletion, percent weight loss (34.5% weight loss in less than 7 months).  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 SLP has been re-consulted for possible need to change diet recommendations.  Noted pt with hyponatremia. Will reduce free water flush volume from 150 ml q 4 hours to 100 ml q 4 hours and  monitor lab trends.  Discussed pt with MD. MD would like pt to transition to nocturnal feeds. Discussed with pt who was very reluctant to go above rate of 65 ml/hr but agreed to try tonight. Discussed with pt that he is able to be disconnected from tube feeds for 8 hours during the day.  Weight trending up. Pt with non-pitting edema to BLE.  Current TF: Osmolite 1.5 @ 65 ml/hr (goal rate is 70 ml/hr), ProSource TF 45 ml TID, free water 150 ml q 4 hours  Meal Completion: 75% x 1 dysphagia 1 meal, 0-60% x 7 clear liquid meals  Medications reviewed and include: SSI q 4 hours, Novolog 6 units TID with meals, Levemir 25 units daily, Reglan 10 mg QID, protonix  Labs from 9/10 reviewed: sodium 129, hemoglobin 9.5 CBG's: 159-294 x 24 hours  UOP: 1300 ml x 24 hours  Diet Order:   Diet Order            DIET - DYS 1 Room service appropriate? Yes; Fluid consistency: Thin  Diet effective now                 EDUCATION NEEDS:   Education needs have been addressed  Skin:  Skin Assessment: Skin Integrity Issues: Other: skin irritation at G-J tube site  Last BM:  01/03/20 small type 6  Height:   Ht Readings from Last 1 Encounters:  12/25/19 '6\' 2"'  (1.88 m)    Weight:   Wt Readings from Last 1 Encounters:  01/02/20 99.6 kg    Ideal Body Weight:  86.3 kg  BMI:  Body mass index is 28.19 kg/m.  Estimated Nutritional Needs:   Kcal:  2500-2800kcal/day  Protein:  125-140g/day  Fluid:  2.6-2.9L/day    Gaynell Face, MS, RD, LDN Inpatient Clinical Dietitian Please see AMiON for contact information.

## 2020-01-03 NOTE — Plan of Care (Signed)

## 2020-01-03 NOTE — Plan of Care (Signed)
°  Problem: Education: Goal: Knowledge of General Education information will improve Description: Including pain rating scale, medication(s)/side effects and non-pharmacologic comfort measures Outcome: Progressing   Problem: Health Behavior/Discharge Planning: Goal: Ability to manage health-related needs will improve Outcome: Progressing   Problem: Clinical Measurements: Goal: Ability to maintain clinical measurements within normal limits will improve Outcome: Progressing Goal: Will remain free from infection Outcome: Progressing Goal: Diagnostic test results will improve Outcome: Progressing Goal: Respiratory complications will improve Outcome: Progressing Goal: Cardiovascular complication will be avoided Outcome: Progressing   Problem: Activity: Goal: Risk for activity intolerance will decrease Outcome: Progressing   Problem: Nutrition: Goal: Adequate nutrition will be maintained Outcome: Progressing   Problem: Coping: Goal: Level of anxiety will decrease Outcome: Progressing   Problem: Elimination: Goal: Will not experience complications related to bowel motility Outcome: Progressing Goal: Will not experience complications related to urinary retention Outcome: Progressing   Problem: Pain Managment: Goal: General experience of comfort will improve Outcome: Progressing   Problem: Safety: Goal: Ability to remain free from injury will improve Outcome: Progressing   Problem: Skin Integrity: Goal: Risk for impaired skin integrity will decrease Outcome: Progressing   Problem: Education: Goal: Ability to describe self-care measures that may prevent or decrease complications (Diabetes Survival Skills Education) will improve Outcome: Progressing Goal: Individualized Educational Video(s) Outcome: Progressing   Problem: Coping: Goal: Ability to adjust to condition or change in health will improve Outcome: Progressing   Problem: Fluid Volume: Goal: Ability to  maintain a balanced intake and output will improve Outcome: Progressing   Problem: Health Behavior/Discharge Planning: Goal: Ability to identify and utilize available resources and services will improve Outcome: Progressing Goal: Ability to manage health-related needs will improve Outcome: Progressing   Problem: Metabolic: Goal: Ability to maintain appropriate glucose levels will improve Outcome: Progressing   Problem: Nutritional: Goal: Maintenance of adequate nutrition will improve Outcome: Progressing Goal: Progress toward achieving an optimal weight will improve Outcome: Progressing   Problem: Skin Integrity: Goal: Risk for impaired skin integrity will decrease Outcome: Progressing   Problem: Tissue Perfusion: Goal: Adequacy of tissue perfusion will improve Outcome: Progressing   Problem: Education: Goal: Knowledge of the prescribed therapeutic regimen will improve Outcome: Progressing   Problem: Bowel/Gastric: Goal: Gastrointestinal status for postoperative course will improve Outcome: Progressing   Problem: Nutritional: Goal: Ability to achieve adequate nutritional intake will improve Outcome: Progressing   Problem: Clinical Measurements: Goal: Postoperative complications will be avoided or minimized Outcome: Progressing   Problem: Respiratory: Goal: Ability to maintain a clear airway will improve Outcome: Progressing   Problem: Skin Integrity: Goal: Demonstration of wound healing without infection will improve Outcome: Progressing

## 2020-01-03 NOTE — Evaluation (Signed)
Clinical/Bedside Swallow Evaluation Patient Details  Name: Steven Crane MRN: [id number] Date of Birth: January 04, 1961  Today's Date: 01/03/2020 Time: SLP Start Time (ACUTE ONLY): 1320 SLP Stop Time (ACUTE ONLY): 1341 SLP Time Calculation (min) (ACUTE ONLY): 21 min  Past Medical History:  Past Medical History:  Diagnosis Date  . Anxiety   . Arthritis    "hands, back, knees" (08/27/2013)  . Asthma   . CHF (congestive heart failure) (HCC) 02/2010  . Chronic bronchitis (HCC)    "get it q year"  . Chronic lower back pain   . Closed head injury 1975  . Coronary artery disease May 2015   s/p successful PTCA/DES x 1 to distal RCA and PTCA/DES x 1 to OM-20 Aug 2013  . Depression   . DVT (deep venous thrombosis) (HCC) 2008   "LLE"  . Esophageal cancer (HCC)   . GERD (gastroesophageal reflux disease)   . Headache    hs of but no longer has problems   . History of kidney stones   . Hyperlipemia   . Hypertension   . Hypertriglyceridemia   . LV dysfunction    LVEF 40% at time of cardiac cath May 2015  . Morbid obesity (HCC)   . Myocardial infarction (HCC) 02/2010  . Psoriasis   . Type II diabetes mellitus (HCC)    Past Surgical History:  Past Surgical History:  Procedure Laterality Date  . BIOPSY  06/17/2019   Procedure: BIOPSY;  Surgeon: Corbin Ade, MD;  Location: [facility];  Service: Endoscopy;;  . COLONOSCOPY  01/06/2012   Dr. Jena Gauss: suboptimal prep. normal exam. next colonoscopy in 10 years.   . CORONARY ANGIOPLASTY WITH STENT PLACEMENT  02/2010; 08/27/2013   "1 + 3"   . ESOPHAGEAL STENT PLACEMENT N/A 12/14/2019   Procedure: ESOPHAGEAL STENT PLACEMENT;  Surgeon: Corliss Skains, MD;  Location: [facility] OR;  Service: Thoracic;  Laterality: N/A;  . ESOPHAGOGASTRODUODENOSCOPY N/A 10/18/2019   Procedure: ESOPHAGOGASTRODUODENOSCOPY (EGD);  Surgeon: Corliss Skains, MD;  Location: [facility] OR;  Service: Thoracic;  Laterality: N/A;  . ESOPHAGOGASTRODUODENOSCOPY N/A 12/14/2019    Procedure: ESOPHAGOGASTRODUODENOSCOPY (EGD);  Surgeon: Corliss Skains, MD;  Location: [facility] OR;  Service: Thoracic;  Laterality: N/A;  . ESOPHAGOGASTRODUODENOSCOPY N/A 31-Dec-2019   Procedure: ESOPHAGOGASTRODUODENOSCOPY (EGD) REMOVAL OF ESOPHAGEAL STENT;  Surgeon: Corliss Skains, MD;  Location: [facility] OR;  Service: Thoracic;  Laterality: N/A;  . ESOPHAGOGASTRODUODENOSCOPY (EGD) WITH PROPOFOL N/A 06/17/2019   Procedure: ESOPHAGOGASTRODUODENOSCOPY (EGD) WITH PROPOFOL;  Surgeon: Corbin Ade, MD;  Location: [facility];  Service: Endoscopy;  Laterality: N/A;  11:15am  . FRACTURE SURGERY    . INTERCOSTAL NERVE BLOCK  10/18/2019   Procedure: INTERCOSTAL NERVE BLOCK;  Surgeon: Corliss Skains, MD;  Location: [facility] OR;  Service: Thoracic;;  . IR CM INJ ANY COLONIC TUBE W/FLUORO  11/17/2019  . IR IMAGING GUIDED PORT INSERTION  08/03/2019  . IR REPLC DUODEN/JEJUNO TUBE PERCUT W/FLUORO  12/04/2019  . KNEE ARTHROSCOPY Left 1981  . LEFT HEART CATHETERIZATION WITH CORONARY ANGIOGRAM N/A 08/27/2013   Procedure: LEFT HEART CATHETERIZATION WITH CORONARY ANGIOGRAM;  Surgeon: Kathleene Hazel, MD;  Location: [facility] CATH LAB;  Service: Cardiovascular;  Laterality: N/A;  . NODE DISSECTION  10/18/2019   Procedure: NODE DISSECTION;  Surgeon: Corliss Skains, MD;  Location: [facility] OR;  Service: Thoracic;;  . right wrist fracture surgery     . TIBIA IM NAIL INSERTION Left 08/21/2018   Procedure: INTRAMEDULLARY (IM) NAIL TIBIAL;  Surgeon:  Samson Frederic, MD;  Location: [facility] ORS;  Service: Orthopedics;  Laterality: Left;   HPI:  Pt is a 59 y.o. male status post robotic assisted esophagectomy in June, 2021 for stage III cancer who was discharged on postoperative day 6, but he represented a month later with a small esophageal leak in his gastric conduit. He was made NPO for several weeks had interval healing of the esophageal leak but had some dysphagia with when he attempted clear liquids and had been intolerant of  J-tube feeds. EGD with esophageal stent placement completed on 8/24 to allow p.o. intake and leak was found to be healed and pt was discharged thereafter. Pt contacted his doctor's office on 8/26 with c/o "difficulty breathing after her procedure" and was advised that he should "take his time while drinking liquids and think about swallowing" and "make sure he brushes his teeth and uses mouth wash when he tries to drink liquids again to prevent aspiration pneumonia". Pt was referred for home health SLP services on 8/27. He presented to the [facility] ED  on 8/30 secondary to c/o intractable hiccups. Pt reported on admission that after the surgery he tried to drink something, immediately had chest pain and did not try to drink anything since. Pt was transferred to [facility] and esophageal stent removal was attempted on 9/7; however, surgeon had difficulty delineating the true lumen from the previous perforation site and the procedure was aborted with plan for esophagram on 9/7 which showed no convincing evidence of residual perforation and stent was subsequently removed. CXR 9/9 negative for acute changes. Pt was started on a clear liquid diet and there has been multiple mentions of the pt receiving SLP services to assist with swallowing and diet. SLP services were ordered on 9/13.    Assessment / Plan / Recommendation Clinical Impression  Pt was seen for bedside swallow evaluation and he reported that he gets "strangled" on liquids when he is not sitting upright and reported globus sensation at the level of his pharynx and mid chest. Oral mechanism exam was [facility] and dentition was limited. He requested that puree solids be deferred since they "burn" his esophagus. He tolerated ice chips and thin liquids via cup without overt s/sx of aspiration, but exhibited coughing with thin liquids via straw, suggesting aspiration. A modified barium swallow study is recommended to assess the pharyngeal phase of the swallow.   SLP Visit Diagnosis: Dysphagia, pharyngoesophageal phase (R13.14)    Aspiration Risk  Mild aspiration risk    Diet Recommendation Thin liquid;Dysphagia 1 (Puree) (Continue current diet until MBS conducted)   Liquid Administration via: Cup;No straw Medication Administration: Crushed with puree Supervision: Staff to assist with self feeding Compensations: Slow rate;Small sips/bites;Follow solids with liquid Postural Changes: Remain upright for at least 30 minutes after po intake;Seated upright at 90 degrees    Other  Recommendations Oral Care Recommendations: Oral care BID   Follow up Recommendations Other (comment) (TBD)      Frequency and Duration min 2x/week  2 weeks       Prognosis Prognosis for Safe Diet Advancement: Good Barriers to Reach Goals: Severity of deficits      Swallow Study   General Date of Onset: 12/14/19 HPI: Pt is a 59 y.o. male status post robotic assisted esophagectomy in June, 2021 for stage III cancer who was discharged on postoperative day 6, but he represented a month later with a small esophageal leak in his gastric conduit. He was made NPO for several weeks  had interval healing of the esophageal leak but had some dysphagia with when he attempted clear liquids and has been intolerant of J-tube feeds. EGD with esophageal stent placement completed on 8/24 to allow p.o. intake and leak was found to be healed and pt was discharged thereafter. Pt contacted his doctor's office on 8/26 with c/o "difficulty breathing after her procedure" and was advised that he should "take his time while drinking liquids and think about swallowing" and "make sure he brushes his teeth and uses mouth wash when he tries to drink liquids again to prevent aspiration pneumonia". Pt was referred for home health SLP services on 8/27. He presented to the [facility] ED  on 8/30 secondary to c/o intractable hiccups. Pt reported on admission that after the surgery he tried to drink something,  immediately had chest pain and did not try to drink anything since. Pt was transferred to [facility] and esophageal stent removal was attempted on 9/7; however, surgeon had difficulty delineating the true lumen from the previous perforation site and the procedure was aborted with plan for esophagram on 9/7 which showed no convincing evidence of residual perforation and stent was subsequently removed. CXR 9/9 negative for acute changes. Pt was started on a clear liquid diet and there has been multiple mentions of the pt receiving SLP services to assist with swallowing and diet. SLP services were ordered on 9/13.  Type of Study: Bedside Swallow Evaluation Previous Swallow Assessment: None Diet Prior to this Study: Dysphagia 1 (puree);Thin liquids Temperature Spikes Noted: No Respiratory Status: Room air History of Recent Intubation: Yes Length of Intubations (days):  (for surgery) Date extubated: 12/23/2019 Behavior/Cognition: Alert;Cooperative;Pleasant mood Oral Cavity Assessment: Within Functional Limits Oral Care Completed by SLP: No Oral Cavity - Dentition: Adequate natural dentition;Poor condition;Missing dentition Vision: Functional for self-feeding Self-Feeding Abilities: Able to feed self Patient Positioning: Upright in bed;Postural control adequate for testing Baseline Vocal Quality: Normal Volitional Cough: Strong Volitional Swallow: Able to elicit    Oral/Motor/Sensory Function Overall Oral Motor/Sensory Function: Within functional limits   Ice Chips Ice chips: Within functional limits Presentation: Spoon   Thin Liquid Thin Liquid: Impaired Presentation: Straw Pharyngeal  Phase Impairments: Cough - Immediate Other Comments: Thin liquids via cup tolerated w/o s/sx of aspiration.     Nectar Thick Nectar Thick Liquid: Not tested   Honey Thick Honey Thick Liquid: Not tested   Puree Puree: Not tested (Pt refused stating it will "burn" his esophagus)   Solid    Sabella Traore I. Vear Clock,  MS, CCC-SLP Acute Rehabilitation Services Office number [phone] Pager [phone] Solid: Not tested      Scheryl Marten 01/03/2020,2:39 PM

## 2020-01-03 NOTE — Progress Notes (Addendum)
° °   °  [address]       [place] [address]             [phone]      6 Days Post-Op Procedure(s) (LRB): ESOPHAGOGASTRODUODENOSCOPY (EGD) REMOVAL OF ESOPHAGEAL STENT (N/A) Subjective: He feels okay today. He is frustrated with not being able to swallow and tolerate PO.   Objective: Vital signs in last 24 hours: Temp:  [98.1 F (36.7 C)-100.4 F (38 C)] 100.4 F (38 C) (09/13 0749) Pulse Rate:  [83-100] 97 (09/13 0749) Cardiac Rhythm: Normal sinus rhythm (09/13 0708) Resp:  [19-28] 21 (09/13 0749) BP: (110-156)/(66-99) 148/73 (09/13 0749) SpO2:  [90 %-95 %] 95 % (09/13 0749)     Intake/Output from previous day: 09/12 0701 - 09/13 0700 In: -  Out: 1300 [Urine:1300] Intake/Output this shift: No intake/output data recorded.  General appearance: alert, cooperative and no distress Heart: regular rate and rhythm, S1, S2 normal, no murmur, click, rub or gallop Lungs: clear to auscultation bilaterally Abdomen: soft, non-tender; bowel sounds normal; no masses,  no organomegaly Extremities: extremities normal, atraumatic, no cyanosis or edema Wound: clean and dry  Lab Results: No results for input(s): WBC, HGB, HCT, PLT in the last 72 hours. BMET: No results for input(s): NA, K, CL, CO2, GLUCOSE, BUN, CREATININE, CALCIUM in the last 72 hours.  PT/INR: No results for input(s): LABPROT, INR in the last 72 hours. ABG    Component Value Date/Time   PHART 7.462 (H) 10/19/2019 0510   HCO3 23.7 10/19/2019 0510   TCO2 26 10/18/2019 1640   ACIDBASEDEF 3.9 (H) 10/18/2019 1809   O2SAT 98.4 10/19/2019 0510   CBG (last 3)  Recent Labs    01/02/20 2345 01/03/20 0352 01/03/20 0619  GLUCAP 165* 168* 218*    Assessment/Plan: S/P Procedure(s) (LRB): ESOPHAGOGASTRODUODENOSCOPY (EGD) REMOVAL OF ESOPHAGEAL STENT (N/A)  1. Still having swallowing and digestion issues. Patient states the food "balls up" in his stomach.  2. He has had normal bowel movements, most  recent being yesterday. 3. SLP re-consulted for possible need to change diet recommendations 4. Blood glucose has been mostly controlled 5. Continue aggressive physical therapy and SLP 6. Patient is asking to change to omeprazole PPI, apparently discontinued in the past by his cardiologist   Plan: Continue medical management. Continue to work towards tolerating a puree diet   LOS: 10 days    Elgie Collard 01/03/2020  The goal is to transition to cycled nocturnal feeds to encourage more p.o. intake during the day.  We will need to slowly increase his tube feed rate until he meets his goal.  Lajuana Matte

## 2020-01-03 NOTE — Plan of Care (Signed)
  Problem: Education: Goal: Knowledge of General Education information will improve Description: Including pain rating scale, medication(s)/side effects and non-pharmacologic comfort measures Outcome: Progressing   Problem: Health Behavior/Discharge Planning: Goal: Ability to manage health-related needs will improve Outcome: Progressing   Problem: Clinical Measurements: Goal: Ability to maintain clinical measurements within normal limits will improve Outcome: Progressing Goal: Will remain free from infection Outcome: Progressing Goal: Diagnostic test results will improve Outcome: Progressing Goal: Respiratory complications will improve Outcome: Progressing Goal: Cardiovascular complication will be avoided Outcome: Progressing   Problem: Activity: Goal: Risk for activity intolerance will decrease Outcome: Progressing   Problem: Nutrition: Goal: Adequate nutrition will be maintained Outcome: Progressing   Problem: Coping: Goal: Level of anxiety will decrease Outcome: Progressing   Problem: Elimination: Goal: Will not experience complications related to bowel motility Outcome: Progressing Goal: Will not experience complications related to urinary retention Outcome: Progressing   Problem: Pain Managment: Goal: General experience of comfort will improve Outcome: Progressing   Problem: Safety: Goal: Ability to remain free from injury will improve Outcome: Progressing   Problem: Skin Integrity: Goal: Risk for impaired skin integrity will decrease Outcome: Progressing   Problem: Education: Goal: Ability to describe self-care measures that may prevent or decrease complications (Diabetes Survival Skills Education) will improve Outcome: Progressing Goal: Individualized Educational Video(s) Outcome: Progressing   Problem: Coping: Goal: Ability to adjust to condition or change in health will improve Outcome: Progressing   Problem: Fluid Volume: Goal: Ability to  maintain a balanced intake and output will improve Outcome: Progressing   Problem: Health Behavior/Discharge Planning: Goal: Ability to identify and utilize available resources and services will improve Outcome: Progressing Goal: Ability to manage health-related needs will improve Outcome: Progressing   Problem: Metabolic: Goal: Ability to maintain appropriate glucose levels will improve Outcome: Progressing   Problem: Nutritional: Goal: Maintenance of adequate nutrition will improve Outcome: Progressing Goal: Progress toward achieving an optimal weight will improve Outcome: Progressing   Problem: Skin Integrity: Goal: Risk for impaired skin integrity will decrease Outcome: Progressing   Problem: Tissue Perfusion: Goal: Adequacy of tissue perfusion will improve Outcome: Progressing   Problem: Education: Goal: Knowledge of the prescribed therapeutic regimen will improve Outcome: Progressing   Problem: Bowel/Gastric: Goal: Gastrointestinal status for postoperative course will improve Outcome: Progressing   Problem: Nutritional: Goal: Ability to achieve adequate nutritional intake will improve Outcome: Progressing   Problem: Clinical Measurements: Goal: Postoperative complications will be avoided or minimized Outcome: Progressing   Problem: Respiratory: Goal: Ability to maintain a clear airway will improve Outcome: Progressing   Problem: Skin Integrity: Goal: Demonstration of wound healing without infection will improve Outcome: Progressing

## 2020-01-03 NOTE — Progress Notes (Signed)
Speech Language Pathology Treatment: Dysphagia  Patient Details Name: Steven Crane MRN: [id number] DOB: Dec 25, 1960 Today's Date: 01/03/2020 Time: 4098-1191 SLP Time Calculation (min) (ACUTE ONLY): 16 min  Assessment / Plan / Recommendation Clinical Impression  SLP was charting outside pt's room when pt was explaining to his family member that he did not know what the medical team was doing with regards to his swallowing. Pt's phone was on speaker and his family member expressed concern regarding the need for the team to "figure it out". SLP entered the room and re-educated the pt regarding the results of this bedside swallow evaluation as well as the recommendation for an instrumental assessment. With the pt's permission, the pt's cousin, [name], was educated regarding the results of the swallow study, the fact that it was limited due to the pt's unwillingness to attempt some consistencies and the recommendation for a modified barium swallow study to help guide the pt's treatment plan. Both parties were educated regarding swallowing precautions outlined below to reduce aspiration risk. Pt was coughing throughout this session and reported that he had been coughing like this since he had the thin liquids via straw during the bedside swallow evaluation earlier and therefore did not want anything p.o. at this time. All of their questions were answered and understanding was verbalized by both parties on the plan of care.   HPI HPI: Pt is a 59 y.o. male status post robotic assisted esophagectomy in June, 2021 for stage III cancer who was discharged on postoperative day 6, but he represented a month later with a small esophageal leak in his gastric conduit. He was made NPO for several weeks had interval healing of the esophageal leak but had some dysphagia with when he attempted clear liquids and has been intolerant of J-tube feeds. EGD with esophageal stent placement completed on 8/24 to allow p.o. intake and  leak was found to be healed and pt was discharged thereafter. Pt contacted his doctor's office on 8/26 with c/o "difficulty breathing after her procedure" and was advised that he should "take his time while drinking liquids and think about swallowing" and "make sure he brushes his teeth and uses mouth wash when he tries to drink liquids again to prevent aspiration pneumonia". Pt was referred for home health SLP services on 8/27. He presented to the [facility] ED  on 8/30 secondary to c/o intractable hiccups. Pt reported on admission that after the surgery he tried to drink something, immediately had chest pain and did not try to drink anything since. Pt was transferred to [facility] and esophageal stent removal was attempted on 9/7; however, surgeon had difficulty delineating the true lumen from the previous perforation site and the procedure was aborted with plan for esophagram on 9/7 which showed no convincing evidence of residual perforation and stent was subsequently removed. CXR 9/9 negative for acute changes. Pt was started on a clear liquid diet and there has been multiple mentions of the pt receiving SLP services to assist with swallowing and diet. SLP services were ordered on 9/13.       SLP Plan  New goals to be determined pending instrumental study       Recommendations  Diet recommendations: Thin liquid Liquids provided via: Cup;No straw Medication Administration: Crushed with puree Supervision: Patient able to self feed Compensations: Slow rate;Small sips/bites;Follow solids with liquid                Oral Care Recommendations: Oral care BID Follow up Recommendations: Other (  comment) (TBD) SLP Visit Diagnosis: Dysphagia, pharyngoesophageal phase (R13.14) Plan: New goals to be determined pending instrumental study       Leveda Kendrix I. Vear Clock, MS, CCC-SLP Acute Rehabilitation Services Office number [phone] Pager [phone]                Scheryl Marten 01/03/2020, 2:53 PM

## 2020-01-04 ENCOUNTER — Inpatient Hospital Stay (HOSPITAL_COMMUNITY): Payer: BC Managed Care – PPO

## 2020-01-04 LAB — CBC
HCT: 27.1 % — ABNORMAL LOW (ref 39.0–52.0)
Hemoglobin: 8.6 g/dL — ABNORMAL LOW (ref 13.0–17.0)
MCH: 27 pg (ref 26.0–34.0)
MCHC: 31.7 g/dL (ref 30.0–36.0)
MCV: 85.2 fL (ref 80.0–100.0)
Platelets: 309 10*3/uL (ref 150–400)
RBC: 3.18 MIL/uL — ABNORMAL LOW (ref 4.22–5.81)
RDW: 14.5 % (ref 11.5–15.5)
WBC: 9 10*3/uL (ref 4.0–10.5)
nRBC: 0 % (ref 0.0–0.2)

## 2020-01-04 LAB — GLUCOSE, CAPILLARY
Glucose-Capillary: 161 mg/dL — ABNORMAL HIGH (ref 70–99)
Glucose-Capillary: 212 mg/dL — ABNORMAL HIGH (ref 70–99)
Glucose-Capillary: 150 mg/dL — ABNORMAL HIGH (ref 70–99)
Glucose-Capillary: 89 mg/dL (ref 70–99)
Glucose-Capillary: 111 mg/dL — ABNORMAL HIGH (ref 70–99)
Glucose-Capillary: 158 mg/dL — ABNORMAL HIGH (ref 70–99)

## 2020-01-04 MED ORDER — PROSOURCE TF PO LIQD
90.0000 mL | Freq: Three times a day (TID) | ORAL | Status: DC
  Administered 2020-01-04 – 2020-01-06 (×7): 90 mL
  Filled 2020-01-04 (×7): qty 90

## 2020-01-04 NOTE — Progress Notes (Signed)
Physical Therapy Treatment Patient Details Name: Steven Crane MRN: [id number] DOB: 1960/12/15 Today's Date: 01/04/2020    History of Present Illness 59 yo admitted for esophageal stent removal on 9//7. Pt had esophagectomy 10/18/19 for esophageal CA with post op leak with stent placed 8/24. PMhx: CAD, obesity, HTN, DM, HLD, arthritis, anxiety, AFib    PT Comments    Pt continues to demonstrated general deconditioning after extended illness. Able to amb but fatigues easily. Feel he could benefit from HHPT if he will agree to it. Will continue to follow.    Follow Up Recommendations  Home health PT     Equipment Recommendations  None recommended by PT    Recommendations for Other Services       Precautions / Restrictions Precautions Precautions: Fall    Mobility  Bed Mobility Overal bed mobility: Modified Independent             General bed mobility comments: Incr time and effort and HOB elevated 35 degrees  Transfers Overall transfer level: Needs assistance Equipment used: Rolling walker (2 wheeled) Transfers: Sit to/from [facility] Sit to Stand: Supervision Stand pivot transfers: Supervision       General transfer comment: Assist for safety  Ambulation/Gait Ambulation/Gait assistance: Min guard Gait Distance (Feet): 225 Feet Assistive device: Rolling walker (2 wheeled) Gait Pattern/deviations: Step-through pattern;Decreased stride length;Trunk flexed Gait velocity: decr Gait velocity interpretation: 1.31 - 2.62 ft/sec, indicative of limited community ambulator General Gait Details: Heavy reliance on walker. Min guard for Barrister's clerk    Modified Rankin (Stroke Patients Only)       Balance Overall balance assessment: Mild deficits observed, not formally tested                                          Cognition Arousal/Alertness: Awake/alert Behavior During Therapy: WFL  for tasks assessed/performed Overall Cognitive Status: Within Functional Limits for tasks assessed                                        Exercises      General Comments        Pertinent Vitals/Pain Pain Assessment: Faces Faces Pain Scale: No hurt    Home Living                      Prior Function            PT Goals (current goals can now be found in the care plan section) Acute Rehab PT Goals Patient Stated Goal: return home, eat Progress towards PT goals: Progressing toward goals    Frequency    Min 3X/week      PT Plan Discharge plan needs to be updated    Co-evaluation              AM-PAC PT "6 Clicks" Mobility   Outcome Measure  Help needed turning from your back to your side while in a flat bed without using bedrails?: None Help needed moving from lying on your back to sitting on the side of a flat bed without using bedrails?: None Help needed moving to and from a bed to a chair (including a wheelchair)?: A Little  Help needed standing up from a chair using your arms (e.g., wheelchair or bedside chair)?: A Little Help needed to walk in hospital room?: A Little Help needed climbing 3-5 steps with a railing? : A Little 6 Click Score: 20    End of Session Equipment Utilized During Treatment: Gait belt Activity Tolerance: Patient limited by fatigue Patient left: with call bell/phone within reach;in chair Nurse Communication: Mobility status PT Visit Diagnosis: Other abnormalities of gait and mobility (R26.89)     Time: 5521-7471 (pt on bsc x 10 minutes) PT Time Calculation (min) (ACUTE ONLY): 36 min  Charges:  $Gait Training: 23-37 mins                     [place] Pager [phone] Office [place] 01/04/2020, 1:52 PM

## 2020-01-04 NOTE — Progress Notes (Signed)
Speech Language Pathology Treatment: Dysphagia  Patient Details Name: Steven Crane MRN: [id number] DOB: June 15, 1960 Today's Date: 01/04/2020 Time: 2130-8657 SLP Time Calculation (min) (ACUTE ONLY): 20 min  Assessment / Plan / Recommendation Clinical Impression  Spent time with pt reviewing MBS at bedside, answering questions, encouraging pt.  Discussed advancing diet to dysphagia 2, which pt is reluctant to do, but agreed to try.  Reminded him that the muscles for chewing/preparing food for swallowing are working well - pt acknowledges his anxiety about choking.  We reviewed sitting upright during and after PO intake, having small snacks throughout the day rather than attempting to eat an entire meal, focusing on eating foods/drinking liquids that bring him some pleasure.  Pt verbalized that he is just tired but doesn't want family/staff to think he is "giving up." Provided support.  D/W RN.      HPI HPI: Pt is a 59 y.o. male status post robotic assisted esophagectomy in June, 2021 for stage III cancer who was discharged on postoperative day 6, but he represented a month later with a small esophageal leak in his gastric conduit. He was made NPO for several weeks had interval healing of the esophageal leak but had some dysphagia with when he attempted clear liquids and has been intolerant of J-tube feeds. EGD with esophageal stent placement completed on 8/24 to allow p.o. intake and leak was found to be healed and pt was discharged thereafter. Pt contacted his doctor's office on 8/26 with c/o "difficulty breathing after her procedure" and was advised that he should "take his time while drinking liquids and think about swallowing" and "make sure he brushes his teeth and uses mouth wash when he tries to drink liquids again to prevent aspiration pneumonia". Pt was referred for home health SLP services on 8/27. He presented to the [facility] ED  on 8/30 secondary to c/o intractable hiccups. Pt reported on  admission that after the surgery he tried to drink something, immediately had chest pain and did not try to drink anything since. Pt was transferred to [facility] and esophageal stent removal was attempted on 9/7; however, surgeon had difficulty delineating the true lumen from the previous perforation site and the procedure was aborted with plan for esophagram on 9/7 which showed no convincing evidence of residual perforation and stent was subsequently removed. CXR 9/9 negative for acute changes. Pt was started on a clear liquid diet and there has been multiple mentions of the pt receiving SLP services to assist with swallowing and diet. SLP services were ordered on 9/13.       SLP Plan  Continue with current plan of care       Recommendations  Diet recommendations: Dysphagia 2 (fine chop);Thin liquid Liquids provided via: Cup;Straw Medication Administration: Crushed with puree Supervision: Patient able to self feed Compensations: Follow solids with liquid Postural Changes and/or Swallow Maneuvers: Seated upright 90 degrees                Oral Care Recommendations: Oral care BID Follow up Recommendations: None SLP Visit Diagnosis: Dysphagia, pharyngoesophageal phase (R13.14) Plan: Continue with current plan of care       GO                Blenda Mounts Laurice 01/04/2020, 12:21 PM  Gene Glazebrook L. Samson Frederic, MA CCC/SLP Acute Rehabilitation Services Office number [phone] Pager [phone]

## 2020-01-04 NOTE — Progress Notes (Addendum)
      [address]       [place] [address]             [phone]      7 Days Post-Op Procedure(s) (LRB): ESOPHAGOGASTRODUODENOSCOPY (EGD) REMOVAL OF ESOPHAGEAL STENT (N/A) Subjective: He states he is having a swallow study later today and wants to know the time.   Objective: Vital signs in last 24 hours: Temp:  [98.9 F (37.2 C)-101.4 F (38.6 C)] 100.7 F (38.2 C) (09/14 0555) Pulse Rate:  [86-106] 100 (09/14 0426) Cardiac Rhythm: Normal sinus rhythm (09/14 0710) Resp:  [16-23] 16 (09/14 0443) BP: (126-152)/(67-79) 149/72 (09/14 0420) SpO2:  [91 %-95 %] 92 % (09/14 0420)     Intake/Output from previous day: 09/13 0701 - 09/14 0700 In: 1949.3 [P.O.:240; I.V.:13; NG/GT:1696.3] Out: 1500 [Urine:1500] Intake/Output this shift: No intake/output data recorded.  General appearance: alert, cooperative and no distress Heart: regular rate and rhythm, S1, S2 normal, no murmur, click, rub or gallop Lungs: clear to auscultation bilaterally Abdomen: soft, non-tender; bowel sounds normal; no masses,  no organomegaly Extremities: extremities normal, atraumatic, no cyanosis or edema Wound: clean and dry  Lab Results: No results for input(s): WBC, HGB, HCT, PLT in the last 72 hours. BMET: No results for input(s): NA, K, CL, CO2, GLUCOSE, BUN, CREATININE, CALCIUM in the last 72 hours.  PT/INR: No results for input(s): LABPROT, INR in the last 72 hours. ABG    Component Value Date/Time   PHART 7.462 (H) 10/19/2019 0510   HCO3 23.7 10/19/2019 0510   TCO2 26 10/18/2019 1640   ACIDBASEDEF 3.9 (H) 10/18/2019 1809   O2SAT 98.4 10/19/2019 0510   CBG (last 3)  Recent Labs    01/03/20 1945 01/03/20 2354 01/04/20 0417  GLUCAP 126* 166* 161*    Assessment/Plan: S/P Procedure(s) (LRB): ESOPHAGOGASTRODUODENOSCOPY (EGD) REMOVAL OF ESOPHAGEAL STENT (N/A)  1. Still having swallowing and digestion issues. Patient states the food "balls up" in his stomach.  2. He  has had normal bowel movements, most recent being yesterday. 3. Noted updated recommendations from SLP and appreciate nutritions assistance 4. Blood glucose has been mostly controlled 5. Continue aggressive physical therapy and SLP 6. tmax yesterday 101.4, no labs in a few days, will check a cbc  Plan: continue current care plan. Will order a CBC for today and monitor fevers closely. Swallow study this morning, so I asking the patient to hold off on breakfast   LOS: 11 days    Steven Crane 01/04/2020   Agree with above Will continue to optimize nutrient and swallowing functin  Steven Crane

## 2020-01-04 NOTE — Progress Notes (Signed)
Nutrition Follow-up  DOCUMENTATION CODES:   Not applicable  INTERVENTION:   -Continue dysphagia 2 diet with thin liquids -Magic cup TID with meals, each supplement provides 290 kcal and 9 grams of protein -Vanilla pudding TID with meals -Continue nocturnal feedings:  -Osmolite 1.5 at 70 ml/hr to run over 16 hours from 1600 to 0800 (total of 1120 ml) - ProSource TF 90 ml TID - Free water flushes of 100 ml q 4 hours  Nocturnal tube feeding regimen and free water flushes provide1920kcal, 136grams of protein, and 1469ml of H2O (meets 77% of kcal needs and 100% of protein needs).  - RD will monitor for ability to advance nocturnal tube feeding to goal rate of 100 ml/hr  NUTRITION DIAGNOSIS:   Moderate Malnutrition related to chronic illness (esophageal cancer) as evidenced by moderate fat depletion, moderate muscle depletion, percent weight loss (34.5% weight loss in less than 7 months).  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 s/p esophagoscopy, robotic assisted laparoscopy, robotic assisted thoracoscopy, Ivor Lewis esophagectomy, pyloromyotomy, laparoscopic jejunostomy tube placement 21 French as well as intercostal nerve block 8/76 complicated by contained esophageal perforation s/p esophageal stent placement on 12/14/19 who now admits with FTT and hiccups  9/07 - s/p removal of esophageal stent 9/08 - clear liquid diet 9/10 - dysphagia 1 diet with thin liquids 9/14- s/p MBSS- advanced to dysphagia 2 diet with thin liquids  Reviewed I/O's: +449 ml x 24 hours and +7.6  L since admission   UOP: 1.5 L x 24 hours  Spoke with pt at bedside, who reports that he just finished swallow evaluation. Pt reports he feels very anxious about eating and can only consume a few bites and sips at a time. He consumed some liquids and a few bites of applesauce and nutrigrain bar. Pt reports he did not like the applesauce as "it was too acidic". Discussed dysphagia 2 diet with pt, which will provide him with more options. Pt admits that it will likely take a little for him "to get used to eating again", but is willing to try. He thinks he can tolerate some ice cream and pudding- RD added vanilla Magic Cups and vanilla pudding to each meal tray.   Discussed nocturnal tube feedings with pt. Pt reports he was able to tolerate rate of 70 ml/hr, but he refused to increase rate at this time. Compromised with pt that will continue with current TF rate, but again encouraged trying PO intake as able. RD will also increase protein modular to assist with meeting needs, as pt is unwilling to increase TF rate further at this time.   Medications reviewed and include reglan  Labs reviewed: CBGS: 150-212 (inpatient orders for glycemic control are 0-20 units insulin aspart every 4 hours, 6 units insulin aspart TID with meals, and 25 units insulin detemir daily).    Diet Order:   Diet Order            DIET DYS 2 Room service appropriate? Yes with Assist; Fluid consistency: Thin  Diet effective now  EDUCATION NEEDS:   Education needs have been addressed  Skin:  Skin Assessment: Skin Integrity Issues: Skin Integrity Issues:: Other (Comment) Other: skin irritation at G-J tube site  Last BM:  01/03/20  Height:   Ht Readings from Last 1 Encounters:  12/25/19 6\' 2"  (1.88 m)    Weight:   Wt Readings from Last 1 Encounters:  01/02/20 99.6 kg    Ideal Body Weight:  86.3 kg  BMI:  Body mass index is 28.19 kg/m.  Estimated Nutritional Needs:   Kcal:   2500-2800kcal/day  Protein:  125-140g/day  Fluid:  2.6-2.9L/day    Loistine Chance, RD, LDN, [place] Registered Dietitian II Certified Diabetes Care and Education Specialist Please refer to [facility] for RD and/or RD on-call/weekend/after hours pager

## 2020-01-04 NOTE — Plan of Care (Signed)

## 2020-01-04 NOTE — Progress Notes (Signed)
Modified Barium Swallow Progress Note  Patient Details  Name: Steven Crane MRN: [id number] Date of Birth: 1961-03-22  Today's Date: 01/04/2020  Modified Barium Swallow completed.  Full report located under Chart Review in the Imaging Section.  Brief recommendations include the following:  Clinical Impression  Pt presented with normal oropharyngeal swallow and retention of barium in reconstructed esophagus.  He was self-limiting and cautious with regard to bolus size, resulting in oral holding and partial dispersement of PO into pharynx (leading to ~three sub-swallows per bolus). However, mastication was functional and tongue propulsion was normal. There was adequate laryngeal-vestibule closure, with no aspiration and trace barium residue lining the valleculae.  Materials passed through the UES without difficulty.    Frequent sweeps of the reconstructed esophagus revealed accumulation of barium residue after intake (four teaspoons puree, approximately 3 oz thin liquid, 1/2 teaspoon breakfast bar).  By the end of the study, pt described discomfort, chest pressure, and began coughing with expectoration of saliva but no solid foods. Fluoroscopy revealed no backflow of materials into the pharynx and airway, but continued stasis.  Pt watched the study in real time for the purposes of feedback. We discussed the normal function of the swallow mechanism, and the retention of barium in the esophagus/stomach, leading to his c/o chest pressure and backflow.      Recommend advancing diet to dysphagia 2 (chopped), thin liquids - primarily given [name]'s reluctance and sense of caution.  SLP will follow briefly for further education and management of symptoms.       Swallow Evaluation Recommendations       SLP Diet Recommendations: Dysphagia 2 (Fine chop) solids;Thin liquid   Liquid Administration via: Cup;Straw   Medication Administration: Crushed with puree   Supervision: Patient able to self  feed   Compensations: Follow solids with liquid   Postural Changes: Remain semi-upright after after feeds/meals (Comment);Seated upright at 90 degrees   Oral Care Recommendations: Oral care BID      Jeselle Hiser L. Tivis Ringer, [place] Office number [phone] Pager [phone]   [name] 01/04/2020,11:26 AM

## 2020-01-05 ENCOUNTER — Inpatient Hospital Stay (HOSPITAL_COMMUNITY): Payer: BC Managed Care – PPO

## 2020-01-05 LAB — EXPECTORATED SPUTUM ASSESSMENT W GRAM STAIN, RFLX TO RESP C

## 2020-01-05 LAB — GLUCOSE, CAPILLARY
Glucose-Capillary: 147 mg/dL — ABNORMAL HIGH (ref 70–99)
Glucose-Capillary: 260 mg/dL — ABNORMAL HIGH (ref 70–99)
Glucose-Capillary: 77 mg/dL (ref 70–99)
Glucose-Capillary: 190 mg/dL — ABNORMAL HIGH (ref 70–99)
Glucose-Capillary: 196 mg/dL — ABNORMAL HIGH (ref 70–99)

## 2020-01-05 MED ORDER — AMIODARONE LOAD VIA INFUSION
150.0000 mg | Freq: Once | INTRAVENOUS | Status: DC
  Filled 2020-01-05: qty 83.34

## 2020-01-05 MED ORDER — AMIODARONE IV BOLUS ONLY 150 MG/100ML
150.0000 mg | Freq: Once | INTRAVENOUS | Status: DC
  Filled 2020-01-05 (×2): qty 100

## 2020-01-05 MED ORDER — IOHEXOL 300 MG/ML  SOLN
100.0000 mL | Freq: Once | INTRAMUSCULAR | Status: AC | PRN
  Administered 2020-01-05: 100 mL via INTRAVENOUS

## 2020-01-05 NOTE — Plan of Care (Signed)
  Problem: Clinical Measurements: Goal: Ability to maintain clinical measurements within normal limits will improve Outcome: Progressing Goal: Will remain free from infection Outcome: Progressing Goal: Diagnostic test results will improve Outcome: Progressing Goal: Respiratory complications will improve Outcome: Progressing Goal: Cardiovascular complication will be avoided Outcome: Progressing   Problem: Activity: Goal: Risk for activity intolerance will decrease Outcome: Progressing   Problem: Nutrition: Goal: Adequate nutrition will be maintained Outcome: Progressing   Problem: Coping: Goal: Level of anxiety will decrease Outcome: Progressing   Problem: Elimination: Goal: Will not experience complications related to bowel motility Outcome: Progressing Goal: Will not experience complications related to urinary retention Outcome: Progressing   Problem: Pain Managment: Goal: General experience of comfort will improve Outcome: Progressing   Problem: Safety: Goal: Ability to remain free from injury will improve Outcome: Progressing   Problem: Skin Integrity: Goal: Risk for impaired skin integrity will decrease Outcome: Progressing   Problem: Education: Goal: Ability to describe self-care measures that may prevent or decrease complications (Diabetes Survival Skills Education) will improve Outcome: Progressing Goal: Individualized Educational Video(s) Outcome: Progressing   Problem: Coping: Goal: Ability to adjust to condition or change in health will improve Outcome: Progressing   Problem: Fluid Volume: Goal: Ability to maintain a balanced intake and output will improve Outcome: Progressing   Problem: Health Behavior/Discharge Planning: Goal: Ability to identify and utilize available resources and services will improve Outcome: Progressing Goal: Ability to manage health-related needs will improve Outcome: Progressing   Problem: Metabolic: Goal: Ability to  maintain appropriate glucose levels will improve Outcome: Progressing   Problem: Nutritional: Goal: Maintenance of adequate nutrition will improve Outcome: Progressing Goal: Progress toward achieving an optimal weight will improve Outcome: Progressing   Problem: Skin Integrity: Goal: Risk for impaired skin integrity will decrease Outcome: Progressing   Problem: Tissue Perfusion: Goal: Adequacy of tissue perfusion will improve Outcome: Progressing   Problem: Education: Goal: Knowledge of the prescribed therapeutic regimen will improve Outcome: Progressing   Problem: Bowel/Gastric: Goal: Gastrointestinal status for postoperative course will improve Outcome: Progressing   Problem: Nutritional: Goal: Ability to achieve adequate nutritional intake will improve Outcome: Progressing   Problem: Clinical Measurements: Goal: Postoperative complications will be avoided or minimized Outcome: Progressing   Problem: Respiratory: Goal: Ability to maintain a clear airway will improve Outcome: Progressing   Problem: Skin Integrity: Goal: Demonstration of wound healing without infection will improve Outcome: Progressing

## 2020-01-05 NOTE — Progress Notes (Addendum)
      [address]       [place] [address]             [phone]      8 Days Post-Op Procedure(s) (LRB): ESOPHAGOGASTRODUODENOSCOPY (EGD) REMOVAL OF ESOPHAGEAL STENT (N/A) Subjective: Feels okay other than coughing a lot this morning.   Objective: Vital signs in last 24 hours: Temp:  [98.3 F (36.8 C)-102.9 F (39.4 C)] 98.6 F (37 C) (09/15 0600) Pulse Rate:  [77-104] 98 (09/15 0430) Cardiac Rhythm: Normal sinus rhythm (09/15 0713) Resp:  [17-27] 20 (09/15 0349) BP: (125-145)/(70-89) 145/75 (09/15 0331) SpO2:  [93 %-97 %] 97 % (09/15 0430) Weight:  [100.1 kg] 100.1 kg (09/15 0331)     Intake/Output from previous day: 09/14 0701 - 09/15 0700 In: 3 [I.V.:3] Out: 775 [Urine:775] Intake/Output this shift: No intake/output data recorded.  General appearance: alert, cooperative and no distress Heart: regular rate and rhythm, S1, S2 normal, no murmur, click, rub or gallop Lungs: clear to auscultation bilaterally Abdomen: soft, non-tender; bowel sounds normal; no masses,  no organomegaly Extremities: extremities normal, atraumatic, no cyanosis or edema Wound: clean and dry  Lab Results: Recent Labs    01/04/20 0920  WBC 9.0  HGB 8.6*  HCT 27.1*  PLT 309   BMET: No results for input(s): NA, K, CL, CO2, GLUCOSE, BUN, CREATININE, CALCIUM in the last 72 hours.  PT/INR: No results for input(s): LABPROT, INR in the last 72 hours. ABG    Component Value Date/Time   PHART 7.462 (H) 10/19/2019 0510   HCO3 23.7 10/19/2019 0510   TCO2 26 10/18/2019 1640   ACIDBASEDEF 3.9 (H) 10/18/2019 1809   O2SAT 98.4 10/19/2019 0510   CBG (last 3)  Recent Labs    01/04/20 2012 01/04/20 2326 01/05/20 0329  GLUCAP 111* 158* 147*    Assessment/Plan: S/P Procedure(s) (LRB): ESOPHAGOGASTRODUODENOSCOPY (EGD) REMOVAL OF ESOPHAGEAL STENT (N/A)  1. Tolerated dinner okay last night but has not eaten today 2. Having normal bowel movements 3. Noted updated  recommendations from SLP and appreciate nutritions assistance 4. Blood glucose has been mostly controlled 5. Continue aggressive physical therapy and SLP 6. tmax 101.4, WBC 9.0, ordered him a new incentive spirometer and CXR for increased congestion and possible aspiration.   Plan: Will review CXR when done. Continue medical care plan. Appreciate assistance from SLP/nutrition/PT    LOS: 12 days    Steven Crane 01/05/2020  Another fever today. Awaiting CT and culture results. Currently tachycardic awaiting EKG.  Steven Crane

## 2020-01-05 NOTE — Progress Notes (Signed)
      [address]       [place] [address]             [phone]      Called to the bedside this afternoon regarding an episode of dizziness/visual changes. He never lost consciousness but felt like he had a stoke. I reviewed all labs/CXR/vitals. I performed an exam and the patient has equal strength and movement in all extremities. I do not think this was a stroke. The only abnormality found was his fevers. Tmax was 102.8 yesterday. CXR stable today. I will send pancultures and obtain a CT of the chest and abdomen to r/o abdominal abscess. The patient was discussed with Dr. Kipp Brood.   Nicholes Rough, PA-C

## 2020-01-05 NOTE — Progress Notes (Signed)
   01/05/20 1405  Neurological  Orientation Level Oriented X4  Cognition Appropriate at baseline  Speech Clear  Motor Function/Sensation Assessment Grip  Facial Symmetry Symmetrical  R Hand Grip Present;Moderate  L Hand Grip Moderate ;Present  RUE Motor Response Purposeful movement  LUE Motor Response Purposeful movement  RLE Motor Response Purposeful movement  LLE Motor Response Purposeful movement  Neuro Symptoms None  Glasgow Coma Scale  Eye Opening 4  Best Verbal Response (NON-intubated) 5  Best Motor Response 6  Glasgow Coma Scale Score 15  Neurological  Level of Consciousness Alert  paged PA, pt states feels like I had a stroke. Assessed pt everything WNL. Will come and assess pt.

## 2020-01-05 NOTE — Progress Notes (Signed)
Physical Therapy Treatment Patient Details Name: Steven Crane MRN: [id number] DOB: June 10, 1960 Today's Date: 01/05/2020    History of Present Illness 59 yo admitted for esophageal stent removal on 9//7. Pt had esophagectomy 10/18/19 for esophageal CA with post op leak with stent placed 8/24. PMhx: CAD, obesity, HTN, DM, HLD, arthritis, anxiety, AFib    PT Comments    Pt making slow steady progress.    Follow Up Recommendations  Home health PT     Equipment Recommendations  None recommended by PT    Recommendations for Other Services       Precautions / Restrictions Precautions Precautions: Fall    Mobility  Bed Mobility               General bed mobility comments: Pt sitting EOB  Transfers Overall transfer level: Needs assistance Equipment used: Rolling walker (2 wheeled) Transfers: Sit to/from [facility] Sit to Stand: Supervision         General transfer comment: Assist for safety  Ambulation/Gait Ambulation/Gait assistance: Supervision Gait Distance (Feet): 225 Feet Assistive device: Rolling walker (2 wheeled) Gait Pattern/deviations: Step-through pattern;Decreased stride length;Trunk flexed Gait velocity: decr Gait velocity interpretation: 1.31 - 2.62 ft/sec, indicative of limited community ambulator General Gait Details: supervision for safety   Stairs             Wheelchair Mobility    Modified Rankin (Stroke Patients Only)       Balance Overall balance assessment: Mild deficits observed, not formally tested                                          Cognition Arousal/Alertness: Awake/alert Behavior During Therapy: WFL for tasks assessed/performed Overall Cognitive Status: Within Functional Limits for tasks assessed                                        Exercises      General Comments General comments (skin integrity, edema, etc.): Pt on RA with dyspnea 3/4 with amb. SpO2 not  reading consistently with poor pleth and highly variable numbers. Pt put his nasal cannula back on .       Pertinent Vitals/Pain Pain Assessment: Faces Faces Pain Scale: No hurt    Home Living                      Prior Function            PT Goals (current goals can now be found in the care plan section) Acute Rehab PT Goals Patient Stated Goal: return home, eat Progress towards PT goals: Progressing toward goals    Frequency    Min 3X/week      PT Plan Current plan remains appropriate    Co-evaluation              AM-PAC PT "6 Clicks" Mobility   Outcome Measure  Help needed turning from your back to your side while in a flat bed without using bedrails?: None Help needed moving from lying on your back to sitting on the side of a flat bed without using bedrails?: None Help needed moving to and from a bed to a chair (including a wheelchair)?: A Little Help needed standing up from a chair using your arms (e.g., wheelchair or  bedside chair)?: None Help needed to walk in hospital room?: A Little Help needed climbing 3-5 steps with a railing? : A Little 6 Click Score: 21    End of Session   Activity Tolerance: Patient limited by fatigue Patient left: with call bell/phone within reach;in chair Nurse Communication: Mobility status;Other (comment) (asked tech to amb with pt later in the day) PT Visit Diagnosis: Other abnormalities of gait and mobility (R26.89)     Time: 8337-4451 PT Time Calculation (min) (ACUTE ONLY): 18 min  Charges:  $Gait Training: 8-22 mins                     [place] Pager [phone] Office [place] 01/05/2020, 10:21 AM

## 2020-01-05 NOTE — Plan of Care (Signed)
  Problem: Clinical Measurements: Goal: Ability to maintain clinical measurements within normal limits will improve Outcome: Progressing   Problem: Clinical Measurements: Goal: Diagnostic test results will improve Outcome: Progressing   Problem: Clinical Measurements: Goal: Respiratory complications will improve Outcome: Progressing   Problem: Activity: Goal: Risk for activity intolerance will decrease Outcome: Progressing   Problem: Nutrition: Goal: Adequate nutrition will be maintained Outcome: Progressing   Problem: Elimination: Goal: Will not experience complications related to bowel motility Outcome: Progressing   Problem: Pain Managment: Goal: General experience of comfort will improve Outcome: Progressing   Problem: Skin Integrity: Goal: Risk for impaired skin integrity will decrease Outcome: Progressing   Problem: Health Behavior/Discharge Planning: Goal: Ability to identify and utilize available resources and services will improve Outcome: Progressing   Problem: Education: Goal: Knowledge of the prescribed therapeutic regimen will improve Outcome: Progressing   Problem: Bowel/Gastric: Goal: Gastrointestinal status for postoperative course will improve Outcome: Progressing

## 2020-01-06 ENCOUNTER — Inpatient Hospital Stay (HOSPITAL_COMMUNITY): Payer: BC Managed Care – PPO

## 2020-01-06 DIAGNOSIS — J189 Pneumonia, unspecified organism: Secondary | ICD-10-CM

## 2020-01-06 DIAGNOSIS — Z9889 Other specified postprocedural states: Secondary | ICD-10-CM

## 2020-01-06 DIAGNOSIS — Z9049 Acquired absence of other specified parts of digestive tract: Secondary | ICD-10-CM

## 2020-01-06 DIAGNOSIS — R0602 Shortness of breath: Secondary | ICD-10-CM

## 2020-01-06 LAB — POCT I-STAT 7, (LYTES, BLD GAS, ICA,H+H)
Acid-Base Excess: 2 mmol/L (ref 0.0–2.0)
Bicarbonate: 21.7 mmol/L (ref 20.0–28.0)
Calcium, Ion: 1.06 mmol/L — ABNORMAL LOW (ref 1.15–1.40)
HCT: 48 % (ref 39.0–52.0)
Hemoglobin: 16.3 g/dL (ref 13.0–17.0)
O2 Saturation: 96 %
Patient temperature: 98.3
Potassium: 3 mmol/L — ABNORMAL LOW (ref 3.5–5.1)
Sodium: 134 mmol/L — ABNORMAL LOW (ref 135–145)
TCO2: 22 mmol/L (ref 22–32)
pCO2 arterial: 23.5 mmHg — ABNORMAL LOW (ref 32.0–48.0)
pH, Arterial: 7.573 — ABNORMAL HIGH (ref 7.350–7.450)
pO2, Arterial: 69 mmHg — ABNORMAL LOW (ref 83.0–108.0)

## 2020-01-06 LAB — CBC
HCT: 26.3 % — ABNORMAL LOW (ref 39.0–52.0)
Hemoglobin: 8.4 g/dL — ABNORMAL LOW (ref 13.0–17.0)
MCH: 26.8 pg (ref 26.0–34.0)
MCHC: 31.9 g/dL (ref 30.0–36.0)
MCV: 84 fL (ref 80.0–100.0)
Platelets: 329 10*3/uL (ref 150–400)
RBC: 3.13 MIL/uL — ABNORMAL LOW (ref 4.22–5.81)
RDW: 15 % (ref 11.5–15.5)
WBC: 7.5 10*3/uL (ref 4.0–10.5)
nRBC: 0 % (ref 0.0–0.2)

## 2020-01-06 LAB — URINE CULTURE: Culture: <10000 — AB

## 2020-01-06 LAB — GLUCOSE, CAPILLARY
Glucose-Capillary: 124 mg/dL — ABNORMAL HIGH (ref 70–99)
Glucose-Capillary: 138 mg/dL — ABNORMAL HIGH (ref 70–99)
Glucose-Capillary: 142 mg/dL — ABNORMAL HIGH (ref 70–99)
Glucose-Capillary: 150 mg/dL — ABNORMAL HIGH (ref 70–99)
Glucose-Capillary: 160 mg/dL — ABNORMAL HIGH (ref 70–99)
Glucose-Capillary: 165 mg/dL — ABNORMAL HIGH (ref 70–99)
Glucose-Capillary: 171 mg/dL — ABNORMAL HIGH (ref 70–99)
Glucose-Capillary: 44 mg/dL — CL (ref 70–99)
Glucose-Capillary: 99 mg/dL (ref 70–99)

## 2020-01-06 LAB — RENAL FUNCTION PANEL
Albumin: 1.3 g/dL — ABNORMAL LOW (ref 3.5–5.0)
Anion gap: 9 (ref 5–15)
BUN: 17 mg/dL (ref 6–20)
CO2: 25 mmol/L (ref 22–32)
Calcium: 7.5 mg/dL — ABNORMAL LOW (ref 8.9–10.3)
Chloride: 97 mmol/L — ABNORMAL LOW (ref 98–111)
Creatinine, Ser: 0.58 mg/dL — ABNORMAL LOW (ref 0.61–1.24)
GFR calc Af Amer: >60 mL/min (ref >60)
GFR calc non Af Amer: >60 mL/min (ref >60)
Glucose, Bld: 238 mg/dL — ABNORMAL HIGH (ref 70–99)
Phosphorus: 3.2 mg/dL (ref 2.5–4.6)
Potassium: 3.6 mmol/L (ref 3.5–5.1)
Sodium: 131 mmol/L — ABNORMAL LOW (ref 135–145)

## 2020-01-06 LAB — BASIC METABOLIC PANEL
Anion gap: 12 (ref 5–15)
BUN: 25 mg/dL — ABNORMAL HIGH (ref 6–20)
CO2: 23 mmol/L (ref 22–32)
Calcium: 7.7 mg/dL — ABNORMAL LOW (ref 8.9–10.3)
Chloride: 99 mmol/L (ref 98–111)
Creatinine, Ser: 0.8 mg/dL (ref 0.61–1.24)
GFR calc Af Amer: >60 mL/min (ref >60)
GFR calc non Af Amer: >60 mL/min (ref >60)
Glucose, Bld: 181 mg/dL — ABNORMAL HIGH (ref 70–99)
Potassium: 3.6 mmol/L (ref 3.5–5.1)
Sodium: 134 mmol/L — ABNORMAL LOW (ref 135–145)

## 2020-01-06 LAB — MAGNESIUM: Magnesium: 2 mg/dL (ref 1.7–2.4)

## 2020-01-06 MED ORDER — NOREPINEPHRINE 4 MG/250ML-% IV SOLN
INTRAVENOUS | Status: AC
  Filled 2020-01-06: qty 250

## 2020-01-06 MED ORDER — PHENYLEPHRINE HCL-NACL 10-0.9 MG/250ML-% IV SOLN
INTRAVENOUS | Status: AC
  Filled 2020-01-06: qty 250

## 2020-01-06 MED ORDER — SODIUM CHLORIDE 0.9 % IV SOLN: INTRAVENOUS | Status: DC | PRN

## 2020-01-06 MED ORDER — ACETAMINOPHEN 650 MG RE SUPP: 650.0000 mg | RECTAL | Status: DC | PRN

## 2020-01-06 MED ORDER — ETOMIDATE 2 MG/ML IV SOLN: 0.3000 mg/kg | Freq: Once | INTRAVENOUS | Status: DC

## 2020-01-06 MED ORDER — FREE WATER
60.0000 mL | Status: DC
  Administered 2020-01-06 (×3): 60 mL

## 2020-01-06 MED ORDER — PIPERACILLIN-TAZOBACTAM 3.375 G IVPB
3.3750 g | Freq: Three times a day (TID) | INTRAVENOUS | Status: DC
  Administered 2020-01-06 (×2): 3.375 g via INTRAVENOUS
  Filled 2020-01-06 (×3): qty 50

## 2020-01-06 MED ORDER — VANCOMYCIN HCL 1500 MG/300ML IV SOLN
1500.0000 mg | Freq: Once | INTRAVENOUS | Status: AC
  Administered 2020-01-06: 1500 mg via INTRAVENOUS
  Filled 2020-01-06: qty 300

## 2020-01-06 MED ORDER — EPINEPHRINE 1 MG/10ML IJ SOSY
PREFILLED_SYRINGE | INTRAMUSCULAR | Status: AC
  Filled 2020-01-06: qty 50

## 2020-01-06 MED ORDER — PROPOFOL 1000 MG/100ML IV EMUL
INTRAVENOUS | Status: AC
  Filled 2020-01-06: qty 100

## 2020-01-06 MED ORDER — OSMOLITE 1.5 CAL PO LIQD
1000.0000 mL | ORAL | Status: DC
  Administered 2020-01-06: 1000 mL

## 2020-01-06 MED ORDER — SUCCINYLCHOLINE CHLORIDE 20 MG/ML IJ SOLN
1.5000 mg/kg | Freq: Once | INTRAMUSCULAR | Status: DC
  Filled 2020-01-06: qty 7.49

## 2020-01-06 MED ORDER — VASOPRESSIN 20 UNITS/100 ML INFUSION FOR SHOCK
0.0000 [IU]/min | INTRAVENOUS | Status: DC
  Filled 2020-01-06: qty 100

## 2020-01-06 MED ORDER — VANCOMYCIN HCL IN DEXTROSE 1-5 GM/200ML-% IV SOLN
1000.0000 mg | Freq: Three times a day (TID) | INTRAVENOUS | Status: DC
  Administered 2020-01-06 (×2): 1000 mg via INTRAVENOUS
  Filled 2020-01-06 (×2): qty 200

## 2020-01-06 MED ORDER — PHENYLEPHRINE HCL-NACL 10-0.9 MG/250ML-% IV SOLN: 0.0000 ug/min | INTRAVENOUS | Status: DC

## 2020-01-06 MED ORDER — NOREPINEPHRINE 4 MG/250ML-% IV SOLN: 0.0000 ug/min | INTRAVENOUS | Status: DC

## 2020-01-06 MED ORDER — DEXTROSE 50 % IV SOLN
INTRAVENOUS | Status: AC
  Administered 2020-01-06: 50 mL
  Filled 2020-01-06: qty 50

## 2020-01-07 ENCOUNTER — Encounter: Payer: Self-pay | Admitting: Physician Assistant

## 2020-01-07 MED FILL — Medication: Qty: 1 | Status: AC

## 2020-01-07 NOTE — Significant Event (Signed)
At start of shift patient was noted to be diaphoretic, tachycardic, tachypneic, and hypoxemic. Dr. Lucile Shutters notified through [facility] of findings and stat ABG obtained. At that time pt was still able to follow commands, O2 requirements increased to 12 L HFNC. 21:00 pt spiked temp to 102.3 - cold packs placed, thermostat adjusted. Pt was unable to be aroused at 21:40, Dr. Lucile Shutters notified immediately - who notified ground team. I called RT and with assistance of fellow nursing staff we began prepping to intubate patient. Patient was given Etomidate 10mg  IV x 1 for intubation. Intubation was without difficulty. Phenylephrine infused at 145mcg d/t drop in BP. NS bolus infused concurrently. Propofol started and stopped almost immediately. Levophed started at 22mcg with continued drop in BP. Minutes later pt became bradycardic, carotid pulse not palpable, compressions started immediately. Code Blue called. MD at bedside through entire process. Pt progressed from PEA to asystole. Please see code blue sheet for further information. Pt pronounced at 22:33.   Leonia Reeves, RN

## 2020-01-07 NOTE — Progress Notes (Addendum)
[address]       [place] [address]             [place] SUMMARY   Patient Details  Name: Steven Crane MRN: [id number] DOB: 01/20/1961  Admission/Discharge Information   Admit Date: 01-20-20  Date of Death:  2020-02-02  Time of Death:  22:33  Length of Stay: 9  Referring Physician: Erven Colla, DO   Reason(s) for Hospitalization  The patient was admitted on 01-20-20 for failure to thrive, wound care to his JP site, diet teaching, and removal of his esophageal stent.  On 01/05/2020 Mr. Ector underwent a removal of an esophageal stent by Dr. Kipp Brood.  Diagnoses  Preliminary cause of death:  Secondary Diagnoses (including complications and co-morbidities):   PEA arrest A flutter with RVR Bradycardia Hypotension Fever Sepsis Hypoxia  Co-morbidities:   Esophageal cancer HTN CAD DM2 Anxiety  HA HLD CHF Obesity Psoriasis  DVT    Brief Hospital Course (including significant findings, care, treatment, and services provided and events leading to death)  TAHMID Crane is a 59 y.o. year old male who underwent an esophagectomy in June 2021 for esophageal cancer.  Due to a small esophageal leak he underwent esophageal stenting on 12/14/2019 and subsequent removal of his esophageal stent on 12/29/2019.  On 2020-02-02 he had worsening shortness of breath and hypoxia and was switched to a nonrebreather with SPO2 of 90%.  He had a CT scan of the chest which showed suspected aspiration pneumonia.  We consulted CCM and transferred to the ICU. He was made n.p.o. and started on empiric antibiotics.  Around 9:30 PM the patient became somnolent, diaphoretic, tachycardic, tachypneic, hypoxic and intubation was requested.  Dr. Alfonse Spruce was notified and ordered a stat ABG.  The patient was still able to follow commands at that time but his oxygen requirements increased to 12 L high flow nasal cannula.  At 21:00 the patient spiked a temp to 102.3 and cold  packs were placed and thermostat was adjusted.  The patient was unarousable at 21:40 therefore RT was called and etomidate was given for intubation.  The patient was intubated without difficulty.  Phenylephrine infusion at 115mcg was initiated for the patient's hypotension.  Normal saline bolus was infusing at the time.  Propofol was started but stopped almost immediately.  Levophed was started at 63mcg with a continued drop in blood pressure.  Minutes later the patient became bradycardic, carotid pulse was not palpable, compressions were started immediately.  [facility] was called. Dr. Lucile Shutters was at the bedside through the entire process.  Patient progressed from PEA arrest to asystole.  Patient was pronounced deceased at 22:33.  Most of this information was pulled from documentation during the event.  I was not present during this event.    Pertinent Labs and Studies  Significant Diagnostic Studies DG Chest 2 View  Result Date: 12/17/2019 CLINICAL DATA:  59 year old male with cough. EXAM: CHEST - 2 VIEW COMPARISON:  Chest radiograph dated 12/14/2019. FINDINGS: Right-sided Port-A-Cath in similar position. There is a small right pleural effusion similar to prior radiograph. No focal consolidation or pneumothorax. The cardiac silhouette is within limits. An esophageal stent has been placed, new since the prior radiograph. No acute osseous pathology. IMPRESSION: 1. Small right pleural effusion similar to prior radiograph. 2. Interval placement of an esophageal stent. Electronically Signed   By: Anner Crete M.D.   On: 12/17/2019 00:26  DG Chest 2 View  Result Date: 12/14/2019 CLINICAL DATA:  Preoperative evaluation for anastomotic esophageal leak EXAM: CHEST - 2 VIEW COMPARISON:  Chest radiograph November 15, 2019; barium esophagram December 08, 2019; chest CT November 09, 2019 FINDINGS: Port-A-Cath tip is in the superior vena cava. No pneumothorax. Evidence of gastric pull-through procedure, better appreciated  on lateral view. No pneumomediastinum. No pneumothorax. No edema or airspace opacity. Heart size and pulmonary vascularity normal. No adenopathy. There is degenerative change in the thoracic spine. There are foci of coronary artery calcification. IMPRESSION: Port-A-Cath tip in superior vena cava. No pneumothorax. No pneumomediastinum evident. Apparent gastric pull-through procedure, better seen on lateral view. No edema or airspace opacity. Heart size normal. Multiple foci of coronary artery calcification noted. Electronically Signed   By: Lowella Grip III M.D.   On: 12/14/2019 10:21   CT CHEST W CONTRAST  Result Date: 01/05/2020 CLINICAL DATA:  Abdominal pain, fever, esophageal cancer EXAM: CT CHEST AND ABDOMEN WITH CONTRAST TECHNIQUE: Multidetector CT imaging of the chest and abdomen was performed following the standard protocol during bolus administration of intravenous contrast. CONTRAST:  178mL OMNIPAQUE IOHEXOL 300 MG/ML  SOLN COMPARISON:  12/20/2019 FINDINGS: CT CHEST FINDINGS Cardiovascular: Extensive multi-vessel coronary artery calcification. Coronary artery stenting has likely been performed within the left anterior descending, first diagonal, and right coronary arteries, however, this is not well assessed on this exam. Cardiac size is within normal limits. No pericardial effusion. Central pulmonary arteries are of normal caliber. Thoracic aorta is unremarkable. Right internal jugular chest port is seen with its tip within the terminal superior vena cava. Mediastinum/Nodes: Surgical changes of esophagectomy and gastric pull-up are identified. Since the prior examination, the esophageal stent has been removed. Oral contrast opacifies the proximal, intrathoracic stomach. There is no extraluminal stent shin of contrast to suggest a leak. There is no pathologic thoracic adenopathy. Lungs/Pleura: Since the prior examination, there has developed extensive peribronchial nodular infiltrate within the  basilar right middle lobe and right lower lobe as well as subtotal collapse of the right lower lobe suspicious for changes of atypical infection or aspiration. Moderate right pleural effusion has increased in size since prior examination. Left lung is clear. No pneumothorax. The central airways are widely patent. Musculoskeletal: There are punctate foci of gas identified within the dorsal paraspinal musculature, within the spinal canal, and within the right paraspinal subpleural space at the level of T6-T7. This is of unclear etiology though could be seen in the setting of direct trauma or intervention such as thoracentesis. The osseous structures are unremarkable. Specifically, no associated fracture is identified. CT ABDOMEN FINDINGS Hepatobiliary: Cholelithiasis without pericholecystic inflammatory change identified. Liver unremarkable. Pancreas: Unremarkable Spleen: Unremarkable Adrenals/Urinary Tract: 7 mm nonobstructing calculus noted within the lower pole of the right kidney. Multiple nonobstructing calculi within the left kidney measuring up to 12 mm within the lower pole of the left kidney. The kidneys are otherwise unremarkable. Adrenal glands are normal. Stomach/Bowel: Left mid abdominal percutaneous jejunostomy noted. The visualized large and small bowel are otherwise unremarkable. Vascular/Lymphatic: No pathologic adenopathy within the abdomen and upper retroperitoneum. The abdominal vasculature is unremarkable Other: None significant Musculoskeletal: No acute or significant osseous findings. IMPRESSION: 1. Surgical changes of esophagectomy and gastric pull-up. No evidence of leak. 2. Interval development of extensive peribronchial nodular infiltrate within the basilar right middle lobe and right lower lobe as well as subtotal collapse of the right lower lobe suspicious for changes of atypical infection or aspiration. 3. Moderate right pleural effusion, increased  in size since prior examination. 4.  Punctate foci of gas identified within the dorsal paraspinal musculature, within the spinal canal, and within the right paraspinal subpleural space at the level of T6-T7. This is of unclear etiology though could be seen in the setting of direct trauma or intervention such as thoracentesis. 5. Cholelithiasis without pericholecystic inflammatory change. 6. Bilateral nonobstructing nephrolithiasis. Electronically Signed   By: Fidela Salisbury MD   On: 01/05/2020 23:29   CT Chest W Contrast  Result Date: 12/20/2019 CLINICAL DATA:  Esophageal perforation, chest pain, hiccups; history of esophageal cancer post esophageal stenting, coronary artery disease post MI, CHF, GERD, type II diabetes mellitus, hypertension Chest pain, hiccups EXAM: CT CHEST, ABDOMEN, AND PELVIS WITH CONTRAST TECHNIQUE: Multidetector CT imaging of the chest, abdomen and pelvis was performed following the standard protocol during bolus administration of intravenous contrast. Sagittal and coronal MPR images reconstructed from axial data set. CONTRAST:  191mL OMNIPAQUE IOHEXOL 300 MG/ML SOLN IV. Dilute water-soluble oral contrast. COMPARISON:  Chest radiograph 12/20/2019; CT abdomen 12/04/2019; CT chest 11/13/2019 FINDINGS: CT CHEST FINDINGS Cardiovascular: RIGHT jugular Port-A-Cath, tip SVC. Atherosclerotic calcifications and stents within coronary arteries. Thoracic vascular structures grossly patent on non targeted exam. No pericardial effusion. Heart size normal. Mediastinum/Nodes: Base of cervical region normal appearance. Scattered normal size mediastinal lymph nodes without thoracic adenopathy. Esophageal stent extends from the level of the clavicular heads into the distal esophagus. Mid to distal esophageal wall thickening. No extravasation of oral contrast to suggest esophageal perforation. Small amount of contrast is seen within the distal esophagus and stent. No mediastinal gas or fluid. Lungs/Pleura: Chronic RIGHT pleural effusion.  Chronic atelectasis RIGHT lower lobe. Remaining lungs clear. No infiltrate, LEFT pleural effusion or pneumothorax. No pulmonary mass/nodule. Musculoskeletal: Scattered degenerative changes lower thoracic spine. CT ABDOMEN PELVIS FINDINGS Hepatobiliary: Gallbladder and liver normal appearance Pancreas: Normal appearance Spleen: Normal appearance Adrenals/Urinary Tract: Adrenal glands normal appearance. BILATERAL nonobstructing renal calculi largest 8 mm diameter inferior pole RIGHT kidney. No renal mass or hydronephrosis. No ureteral calcification or dilatation. Bladder unremarkable. Stomach/Bowel: Jejunostomy tube LEFT mid abdomen. Appendiceal base normal appearance. Prior gastric surgery, question version of gastric pull up. Bowel loops unremarkable. Vascular/Lymphatic: Atherosclerotic calcification aorta including origin of the SMA, which appears approximately 50% narrowed. No adenopathy. Reproductive: Unremarkable prostate gland and seminal vesicles Other: Small RIGHT inguinal hernia containing fat. No free air or free fluid. No inflammatory process. Musculoskeletal: Degenerative disc disease changes greatest at L5-S1. No acute osseous findings. IMPRESSION: Esophageal stent extending from the level of the clavicular heads into the distal esophagus. Mid to distal esophageal wall thickening without evidence of perforation; potentially a portion of this could be related to a gastric pull up since there is evidence of gastric surgery, though t such a procedure is not listed in the surgical history in the chart. Chronic RIGHT pleural effusion with mild RIGHT lower lobe atelectasis. BILATERAL nonobstructing renal calculi. Small RIGHT inguinal hernia containing fat. No acute intrathoracic, intra-abdominal or intrapelvic abnormalities. Specifically, no evidence of esophageal perforation. Aortic Atherosclerosis (ICD10-I70.0). Electronically Signed   By: Lavonia Dana M.D.   On: 12/20/2019 09:39   CT ABDOMEN W  CONTRAST  Result Date: 01/05/2020 CLINICAL DATA:  Abdominal pain, fever, esophageal cancer EXAM: CT CHEST AND ABDOMEN WITH CONTRAST TECHNIQUE: Multidetector CT imaging of the chest and abdomen was performed following the standard protocol during bolus administration of intravenous contrast. CONTRAST:  129mL OMNIPAQUE IOHEXOL 300 MG/ML  SOLN COMPARISON:  12/20/2019 FINDINGS: CT CHEST FINDINGS Cardiovascular: Extensive  multi-vessel coronary artery calcification. Coronary artery stenting has likely been performed within the left anterior descending, first diagonal, and right coronary arteries, however, this is not well assessed on this exam. Cardiac size is within normal limits. No pericardial effusion. Central pulmonary arteries are of normal caliber. Thoracic aorta is unremarkable. Right internal jugular chest port is seen with its tip within the terminal superior vena cava. Mediastinum/Nodes: Surgical changes of esophagectomy and gastric pull-up are identified. Since the prior examination, the esophageal stent has been removed. Oral contrast opacifies the proximal, intrathoracic stomach. There is no extraluminal stent shin of contrast to suggest a leak. There is no pathologic thoracic adenopathy. Lungs/Pleura: Since the prior examination, there has developed extensive peribronchial nodular infiltrate within the basilar right middle lobe and right lower lobe as well as subtotal collapse of the right lower lobe suspicious for changes of atypical infection or aspiration. Moderate right pleural effusion has increased in size since prior examination. Left lung is clear. No pneumothorax. The central airways are widely patent. Musculoskeletal: There are punctate foci of gas identified within the dorsal paraspinal musculature, within the spinal canal, and within the right paraspinal subpleural space at the level of T6-T7. This is of unclear etiology though could be seen in the setting of direct trauma or intervention such  as thoracentesis. The osseous structures are unremarkable. Specifically, no associated fracture is identified. CT ABDOMEN FINDINGS Hepatobiliary: Cholelithiasis without pericholecystic inflammatory change identified. Liver unremarkable. Pancreas: Unremarkable Spleen: Unremarkable Adrenals/Urinary Tract: 7 mm nonobstructing calculus noted within the lower pole of the right kidney. Multiple nonobstructing calculi within the left kidney measuring up to 12 mm within the lower pole of the left kidney. The kidneys are otherwise unremarkable. Adrenal glands are normal. Stomach/Bowel: Left mid abdominal percutaneous jejunostomy noted. The visualized large and small bowel are otherwise unremarkable. Vascular/Lymphatic: No pathologic adenopathy within the abdomen and upper retroperitoneum. The abdominal vasculature is unremarkable Other: None significant Musculoskeletal: No acute or significant osseous findings. IMPRESSION: 1. Surgical changes of esophagectomy and gastric pull-up. No evidence of leak. 2. Interval development of extensive peribronchial nodular infiltrate within the basilar right middle lobe and right lower lobe as well as subtotal collapse of the right lower lobe suspicious for changes of atypical infection or aspiration. 3. Moderate right pleural effusion, increased in size since prior examination. 4. Punctate foci of gas identified within the dorsal paraspinal musculature, within the spinal canal, and within the right paraspinal subpleural space at the level of T6-T7. This is of unclear etiology though could be seen in the setting of direct trauma or intervention such as thoracentesis. 5. Cholelithiasis without pericholecystic inflammatory change. 6. Bilateral nonobstructing nephrolithiasis. Electronically Signed   By: Fidela Salisbury MD   On: 01/05/2020 23:29   CT ABDOMEN PELVIS W CONTRAST  Result Date: 12/20/2019 CLINICAL DATA:  Esophageal perforation, chest pain, hiccups; history of esophageal cancer  post esophageal stenting, coronary artery disease post MI, CHF, GERD, type II diabetes mellitus, hypertension Chest pain, hiccups EXAM: CT CHEST, ABDOMEN, AND PELVIS WITH CONTRAST TECHNIQUE: Multidetector CT imaging of the chest, abdomen and pelvis was performed following the standard protocol during bolus administration of intravenous contrast. Sagittal and coronal MPR images reconstructed from axial data set. CONTRAST:  141mL OMNIPAQUE IOHEXOL 300 MG/ML SOLN IV. Dilute water-soluble oral contrast. COMPARISON:  Chest radiograph 12/20/2019; CT abdomen 12/04/2019; CT chest 11/13/2019 FINDINGS: CT CHEST FINDINGS Cardiovascular: RIGHT jugular Port-A-Cath, tip SVC. Atherosclerotic calcifications and stents within coronary arteries. Thoracic vascular structures grossly patent on non targeted exam.  No pericardial effusion. Heart size normal. Mediastinum/Nodes: Base of cervical region normal appearance. Scattered normal size mediastinal lymph nodes without thoracic adenopathy. Esophageal stent extends from the level of the clavicular heads into the distal esophagus. Mid to distal esophageal wall thickening. No extravasation of oral contrast to suggest esophageal perforation. Small amount of contrast is seen within the distal esophagus and stent. No mediastinal gas or fluid. Lungs/Pleura: Chronic RIGHT pleural effusion. Chronic atelectasis RIGHT lower lobe. Remaining lungs clear. No infiltrate, LEFT pleural effusion or pneumothorax. No pulmonary mass/nodule. Musculoskeletal: Scattered degenerative changes lower thoracic spine. CT ABDOMEN PELVIS FINDINGS Hepatobiliary: Gallbladder and liver normal appearance Pancreas: Normal appearance Spleen: Normal appearance Adrenals/Urinary Tract: Adrenal glands normal appearance. BILATERAL nonobstructing renal calculi largest 8 mm diameter inferior pole RIGHT kidney. No renal mass or hydronephrosis. No ureteral calcification or dilatation. Bladder unremarkable. Stomach/Bowel:  Jejunostomy tube LEFT mid abdomen. Appendiceal base normal appearance. Prior gastric surgery, question version of gastric pull up. Bowel loops unremarkable. Vascular/Lymphatic: Atherosclerotic calcification aorta including origin of the SMA, which appears approximately 50% narrowed. No adenopathy. Reproductive: Unremarkable prostate gland and seminal vesicles Other: Small RIGHT inguinal hernia containing fat. No free air or free fluid. No inflammatory process. Musculoskeletal: Degenerative disc disease changes greatest at L5-S1. No acute osseous findings. IMPRESSION: Esophageal stent extending from the level of the clavicular heads into the distal esophagus. Mid to distal esophageal wall thickening without evidence of perforation; potentially a portion of this could be related to a gastric pull up since there is evidence of gastric surgery, though t such a procedure is not listed in the surgical history in the chart. Chronic RIGHT pleural effusion with mild RIGHT lower lobe atelectasis. BILATERAL nonobstructing renal calculi. Small RIGHT inguinal hernia containing fat. No acute intrathoracic, intra-abdominal or intrapelvic abnormalities. Specifically, no evidence of esophageal perforation. Aortic Atherosclerosis (ICD10-I70.0). Electronically Signed   By: Lavonia Dana M.D.   On: 12/20/2019 09:39   DG CHEST PORT 1 VIEW  Result Date: 2020/01/12 CLINICAL DATA:  Intubation EXAM: PORTABLE CHEST 1 VIEW COMPARISON:  2020-01-12 FINDINGS: Endotracheal tube tip is at the level of the clavicular heads. Unchanged position of right chest wall Port-A-Cath. Postsurgical appearance of the esophagus projects over the cardiomediastinal silhouette. No focal airspace consolidation. IMPRESSION: Endotracheal tube tip at the level of the clavicular heads. Electronically Signed   By: Ulyses Jarred M.D.   On: January 12, 2020 22:52   DG CHEST PORT 1 VIEW  Result Date: Jan 12, 2020 CLINICAL DATA:  Hypoxia. EXAM: PORTABLE CHEST 1 VIEW  COMPARISON:  January 05, 2020. FINDINGS: The heart size and mediastinal contours are within normal limits. No pneumothorax or pleural effusion is noted. Right internal jugular Port-A-Cath is unchanged in position. Left lung is clear. Minimal right infrahilar subsegmental atelectasis or infiltrate is noted. The visualized skeletal structures are unremarkable. IMPRESSION: Minimal right infrahilar subsegmental atelectasis or infiltrate. Electronically Signed   By: Marijo Conception M.D.   On: 12-Jan-2020 10:33   DG Chest Port 1 View  Result Date: 01/05/2020 CLINICAL DATA:  Shortness of breath EXAM: PORTABLE CHEST 1 VIEW COMPARISON:  12/30/2019 FINDINGS: Cardiac shadow is stable. The lungs are well aerated bilaterally. No focal infiltrate is seen. Contrast material is noted over the medial aspect of the right lung base related to the known gastric pull-through. No focal infiltrate or sizable effusion is seen. Right chest wall port is again noted and stable. IMPRESSION: No acute abnormality noted. Electronically Signed   By: Inez Catalina M.D.   On: 01/05/2020 09:06  DG CHEST PORT 1 VIEW  Result Date: 12/30/2019 CLINICAL DATA:  Chest pain shortness of breath EXAM: PORTABLE CHEST 1 VIEW COMPARISON:  12/23/2019 FINDINGS: Previously seen esophageal stent is been removed in the interval. Right-sided chest wall port is again seen. Cardiac shadow is stable. The lungs are well aerated bilaterally. No focal infiltrate or effusion is noted. IMPRESSION: No acute abnormality noted. Electronically Signed   By: Inez Catalina M.D.   On: 12/30/2019 08:09   Portable chest 1 View  Result Date: 01/11/2020 CLINICAL DATA:  Status post esophagectomy EXAM: PORTABLE CHEST 1 VIEW COMPARISON:  12/20/2019 FINDINGS: Lungs are clear. No pneumothorax or pleural effusion. Right internal jugular chest port is seen with its tip within the superior vena cava. Esophageal stent graft is seen unchanged in position extending from the thoracic inlet  through the level of the right hilum, presumably across the proximal anastomosis of gastric pull-up. Cardiac size is within normal limits. Bilateral hilar enlargement is again seen and relates to central pulmonary arterial enlargement, better noted on prior CT examination of 12/20/2019. Pulmonary vascularity is normal. No acute bone abnormality. IMPRESSION: 1. No active cardiopulmonary disease. 2. Esophageal stent graft unchanged in position. 3. Central pulmonary arterial enlargement, better noted on prior CT examination. Electronically Signed   By: Fidela Salisbury MD   On: 12/26/2019 19:33   DG Chest Port 1 View  Result Date: 12/20/2019 CLINICAL DATA:  Chest pain and hiccups. EXAM: PORTABLE CHEST 1 VIEW COMPARISON:  12/16/2019 FINDINGS: The lungs are clear without focal pneumonia, edema, pneumothorax or pleural effusion. The cardiopericardial silhouette is within normal limits for size. Esophageal stent device noted. Right Port-A-Cath remains in place. The visualized bony structures of the thorax show now acute abnormality. IMPRESSION: No acute cardiopulmonary findings. Electronically Signed   By: Misty Stanley M.D.   On: 12/20/2019 06:45   DG C-Arm 1-60 Min-No Report  Result Date: 12/14/2019 Fluoroscopy was utilized by the requesting physician.  No radiographic interpretation.   DG ESOPHAGUS W SINGLE CM (SOL OR THIN BA)  Result Date: 01/05/2020 CLINICAL DATA:  Esophageal perforation, status post esophageal stent remove earlier today. Prior partial esophagectomy and gastric pull-through. EXAM: ESOPHOGRAM/BARIUM SWALLOW TECHNIQUE: Single contrast examination was performed using water-soluble contrast. FLUOROSCOPY TIME:  Fluoroscopy Time:  3 minutes and 36 seconds Radiation Exposure Index (if provided by the fluoroscopic device): 68.2 mGy Number of Acquired Spot Images: 0 COMPARISON:  CTs the chest abdomen and pelvis of 12/20/2019. A CT after esophagram of 11/14/2019 is also reviewed. FINDINGS: Focused,  single-contrast exam performed with the patient initially in supine position. Subsequently, images were performed with the patient upright. Patient status post partial esophagectomy and gastric pull-through. There is contrast filling of the gastric pull-through, right of midline. With multiple obliquities, no anterior or left-sided contrast is seen to correspond to the ventral perforation on the 11/14/2019 CT. No obstruction of contrast passage. IMPRESSION: Status post a partial esophagectomy and gastric pull-through. Focused, single-contrast exam performed with postoperative anatomy. No convincing evidence of residual perforation, including at the site of anterior perforation on 11/14/2019 CT. If high ongoing clinical concern, given difficulty in distending the gastric pull-through, repeat chest CT could be performed. Electronically Signed   By: Abigail Miyamoto M.D.   On: 01/05/2020 16:00    Microbiology Recent Results (from the past 240 hour(s))  Urine Culture     Status: Abnormal   Collection Time: 01/05/20  3:10 PM   Specimen: Urine, Random  Result Value Ref Range Status  Specimen Description URINE, RANDOM  Final   Special Requests NONE  Final   Culture (A)  Final    <10,000 COLONIES/mL INSIGNIFICANT GROWTH Performed at [facility], [place] [address], [place], [place] [address]    Report Status January 31, 2020 FINAL  Final  Culture, blood (Routine X 2) w Reflex to ID Panel     Status: None (Preliminary result)   Collection Time: 01/05/20  3:40 PM   Specimen: BLOOD RIGHT ARM  Result Value Ref Range Status   Specimen Description BLOOD RIGHT ARM  Final   Special Requests   Final    BOTTLES DRAWN AEROBIC AND ANAEROBIC Blood Culture results may not be optimal due to an excessive volume of blood received in culture bottles   Culture   Final    NO GROWTH 2 DAYS Performed at [facility], [place] [address] [place], [place] [address]    Report Status PENDING  Incomplete  Culture, blood  (Routine X 2) w Reflex to ID Panel     Status: None (Preliminary result)   Collection Time: 01/05/20  3:50 PM   Specimen: BLOOD LEFT ARM  Result Value Ref Range Status   Specimen Description BLOOD LEFT ARM  Final   Special Requests   Final    BOTTLES DRAWN AEROBIC AND ANAEROBIC Blood Culture results may not be optimal due to an excessive volume of blood received in culture bottles   Culture   Final    NO GROWTH 2 DAYS Performed at [facility], [place] [address], [place], [place] [address]    Report Status PENDING  Incomplete  Expectorated sputum assessment w rflx to resp cult     Status: None   Collection Time: 01/05/20  8:35 PM   Specimen: Expectorated Sputum  Result Value Ref Range Status   Specimen Description EXPECTORATED SPUTUM  Final   Special Requests NONE  Final   Sputum evaluation   Final    Sputum specimen not acceptable for testing.  Please recollect.   NOTIFIED J,SUAZO RN @2132  01/05/20 EB Performed at [facility] [address], [place], [place] [address]    Report Status 01/05/2020 FINAL  Final    Lab Basic Metabolic Panel: Recent Labs  Lab 31-Jan-2020 0814 2020/01/31 2031 01/31/2020 2107  NA 131* 134* 134*  K 3.6 3.0* 3.6  CL 97*  --  99  CO2 25  --  23  GLUCOSE 238*  --  181*  BUN 17  --  25*  CREATININE 0.58*  --  0.80  CALCIUM 7.5*  --  7.7*  MG 2.0  --   --   PHOS 3.2  --   --    Liver Function Tests: Recent Labs  Lab 01-31-20 0814  ALBUMIN 1.3*   No results for input(s): LIPASE, AMYLASE in the last 168 hours. No results for input(s): AMMONIA in the last 168 hours. CBC: Recent Labs  Lab 01/04/20 0920 01/31/20 0814 01/31/2020 2031  WBC 9.0 7.5  --   HGB 8.6* 8.4* 16.3  HCT 27.1* 26.3* 48.0  MCV 85.2 84.0  --   PLT 309 329  --    Cardiac Enzymes: No results for input(s): CKTOTAL, CKMB, CKMBINDEX, TROPONINI in the last 168 hours. 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 Concern for aspiration. Moving to ICU, PCCM consulted  2023-02-02 22:33 time and date of death  Elgie Collard 02/03/20, 12:31 PM

## 2020-01-10 LAB — CULTURE, BLOOD (ROUTINE X 2)
Culture: NO GROWTH
Culture: NO GROWTH

## 2020-01-11 NOTE — Discharge Summary (Signed)
[address]       [place] [address]             [place] SUMMARY   Patient Details  Name: Steven Crane MRN: [id number] DOB: Feb 08, 1961  Admission/Discharge Information   Admit Date: 01/14/20  Date of Death:  01-27-20  Time of Death:  22:33  Length of Stay: 9  Referring Physician: Erven Colla, DO   Reason(s) for Hospitalization  The patient was admitted on 2020/01/14 for failure to thrive, wound care to his JP site, diet teaching, and removal of his esophageal stent.  On 01/11/2020 Steven Crane underwent a removal of an esophageal stent by Dr. Kipp Brood.  Diagnoses  Preliminary cause of death:  Secondary Diagnoses (including complications and co-morbidities):   PEA arrest A flutter with RVR Bradycardia Hypotension Fever Sepsis Hypoxia  Co-morbidities:   Esophageal cancer HTN CAD DM2 Anxiety  HA HLD CHF Obesity Psoriasis  DVT    Brief Hospital Course (including significant findings, care, treatment, and services provided and events leading to death)  Steven Crane is a 59 y.o. year old male who underwent an esophagectomy in June 2021 for esophageal cancer.  Due to a small esophageal leak he underwent esophageal stenting on 12/14/2019 and subsequent removal of his esophageal stent on 12/29/2019.  On Jan 27, 2020 he had worsening shortness of breath and hypoxia and was switched to a nonrebreather with SPO2 of 90%.  He had a CT scan of the chest which showed suspected aspiration pneumonia.  We consulted CCM and transferred to the ICU. He was made n.p.o. and started on empiric antibiotics.  Around 9:30 PM the patient became somnolent, diaphoretic, tachycardic, tachypneic, hypoxic and intubation was requested.  Dr. Alfonse Spruce was notified and ordered a stat ABG.  The patient was still able to follow commands at that time but his oxygen requirements increased to 12 L high flow nasal cannula.  At 21:00 the  patient spiked a temp to 102.3 and cold packs were placed and thermostat was adjusted.  The patient was unarousable at 21:40 therefore RT was called and etomidate was given for intubation.  The patient was intubated without difficulty.  Phenylephrine infusion at 135mcg was initiated for the patient's hypotension.  Normal saline bolus was infusing at the time.  Propofol was started but stopped almost immediately.  Levophed was started at 35mcg with a continued drop in blood pressure.  Minutes later the patient became bradycardic, carotid pulse was not palpable, compressions were started immediately.  [facility] was called. Dr. Lucile Shutters was at the bedside through the entire process.  Patient progressed from PEA arrest to asystole.  Patient was pronounced deceased at 22:33.  Most of this information was pulled from documentation during the event.  I was not present during this event.    Pertinent Labs and Studies  Significant Diagnostic Studies DG Chest 2 View  Result Date: 12/17/2019 CLINICAL DATA:  59 year old male with cough. EXAM: CHEST - 2 VIEW COMPARISON:  Chest radiograph dated 12/14/2019. FINDINGS: Right-sided Port-A-Cath in similar position. There is a small right pleural effusion similar to prior radiograph. No focal consolidation or pneumothorax. The cardiac silhouette is within limits. An esophageal stent has been placed, new since the prior radiograph. No acute osseous pathology. IMPRESSION: 1. Small right pleural effusion similar to prior  radiograph. 2. Interval placement of an esophageal stent. Electronically Signed   By: Anner Crete M.D.   On: 12/17/2019 00:26   DG Chest 2 View  Result Date: 12/14/2019 CLINICAL DATA:  Preoperative evaluation for anastomotic esophageal leak EXAM: CHEST - 2 VIEW COMPARISON:  Chest radiograph November 15, 2019; barium esophagram December 08, 2019; chest CT November 09, 2019 FINDINGS: Port-A-Cath tip is in the superior vena cava. No pneumothorax. Evidence of  gastric pull-through procedure, better appreciated on lateral view. No pneumomediastinum. No pneumothorax. No edema or airspace opacity. Heart size and pulmonary vascularity normal. No adenopathy. There is degenerative change in the thoracic spine. There are foci of coronary artery calcification. IMPRESSION: Port-A-Cath tip in superior vena cava. No pneumothorax. No pneumomediastinum evident. Apparent gastric pull-through procedure, better seen on lateral view. No edema or airspace opacity. Heart size normal. Multiple foci of coronary artery calcification noted. Electronically Signed   By: Lowella Grip III M.D.   On: 12/14/2019 10:21   CT CHEST W CONTRAST  Result Date: 01/05/2020 CLINICAL DATA:  Abdominal pain, fever, esophageal cancer EXAM: CT CHEST AND ABDOMEN WITH CONTRAST TECHNIQUE: Multidetector CT imaging of the chest and abdomen was performed following the standard protocol during bolus administration of intravenous contrast. CONTRAST:  134mL OMNIPAQUE IOHEXOL 300 MG/ML  SOLN COMPARISON:  12/20/2019 FINDINGS: CT CHEST FINDINGS Cardiovascular: Extensive multi-vessel coronary artery calcification. Coronary artery stenting has likely been performed within the left anterior descending, first diagonal, and right coronary arteries, however, this is not well assessed on this exam. Cardiac size is within normal limits. No pericardial effusion. Central pulmonary arteries are of normal caliber. Thoracic aorta is unremarkable. Right internal jugular chest port is seen with its tip within the terminal superior vena cava. Mediastinum/Nodes: Surgical changes of esophagectomy and gastric pull-up are identified. Since the prior examination, the esophageal stent has been removed. Oral contrast opacifies the proximal, intrathoracic stomach. There is no extraluminal stent shin of contrast to suggest a leak. There is no pathologic thoracic adenopathy. Lungs/Pleura: Since the prior examination, there has developed  extensive peribronchial nodular infiltrate within the basilar right middle lobe and right lower lobe as well as subtotal collapse of the right lower lobe suspicious for changes of atypical infection or aspiration. Moderate right pleural effusion has increased in size since prior examination. Left lung is clear. No pneumothorax. The central airways are widely patent. Musculoskeletal: There are punctate foci of gas identified within the dorsal paraspinal musculature, within the spinal canal, and within the right paraspinal subpleural space at the level of T6-T7. This is of unclear etiology though could be seen in the setting of direct trauma or intervention such as thoracentesis. The osseous structures are unremarkable. Specifically, no associated fracture is identified. CT ABDOMEN FINDINGS Hepatobiliary: Cholelithiasis without pericholecystic inflammatory change identified. Liver unremarkable. Pancreas: Unremarkable Spleen: Unremarkable Adrenals/Urinary Tract: 7 mm nonobstructing calculus noted within the lower pole of the right kidney. Multiple nonobstructing calculi within the left kidney measuring up to 12 mm within the lower pole of the left kidney. The kidneys are otherwise unremarkable. Adrenal glands are normal. Stomach/Bowel: Left mid abdominal percutaneous jejunostomy noted. The visualized large and small bowel are otherwise unremarkable. Vascular/Lymphatic: No pathologic adenopathy within the abdomen and upper retroperitoneum. The abdominal vasculature is unremarkable Other: None significant Musculoskeletal: No acute or significant osseous findings. IMPRESSION: 1. Surgical changes of esophagectomy and gastric pull-up. No evidence of leak. 2. Interval development of extensive peribronchial nodular infiltrate within the basilar right middle lobe and right lower lobe  as well as subtotal collapse of the right lower lobe suspicious for changes of atypical infection or aspiration. 3. Moderate right pleural  effusion, increased in size since prior examination. 4. Punctate foci of gas identified within the dorsal paraspinal musculature, within the spinal canal, and within the right paraspinal subpleural space at the level of T6-T7. This is of unclear etiology though could be seen in the setting of direct trauma or intervention such as thoracentesis. 5. Cholelithiasis without pericholecystic inflammatory change. 6. Bilateral nonobstructing nephrolithiasis. Electronically Signed   By: Fidela Salisbury MD   On: 01/05/2020 23:29   CT Chest W Contrast  Result Date: 12/20/2019 CLINICAL DATA:  Esophageal perforation, chest pain, hiccups; history of esophageal cancer post esophageal stenting, coronary artery disease post MI, CHF, GERD, type II diabetes mellitus, hypertension Chest pain, hiccups EXAM: CT CHEST, ABDOMEN, AND PELVIS WITH CONTRAST TECHNIQUE: Multidetector CT imaging of the chest, abdomen and pelvis was performed following the standard protocol during bolus administration of intravenous contrast. Sagittal and coronal MPR images reconstructed from axial data set. CONTRAST:  181mL OMNIPAQUE IOHEXOL 300 MG/ML SOLN IV. Dilute water-soluble oral contrast. COMPARISON:  Chest radiograph 12/20/2019; CT abdomen 12/04/2019; CT chest 11/13/2019 FINDINGS: CT CHEST FINDINGS Cardiovascular: RIGHT jugular Port-A-Cath, tip SVC. Atherosclerotic calcifications and stents within coronary arteries. Thoracic vascular structures grossly patent on non targeted exam. No pericardial effusion. Heart size normal. Mediastinum/Nodes: Base of cervical region normal appearance. Scattered normal size mediastinal lymph nodes without thoracic adenopathy. Esophageal stent extends from the level of the clavicular heads into the distal esophagus. Mid to distal esophageal wall thickening. No extravasation of oral contrast to suggest esophageal perforation. Small amount of contrast is seen within the distal esophagus and stent. No mediastinal gas or  fluid. Lungs/Pleura: Chronic RIGHT pleural effusion. Chronic atelectasis RIGHT lower lobe. Remaining lungs clear. No infiltrate, LEFT pleural effusion or pneumothorax. No pulmonary mass/nodule. Musculoskeletal: Scattered degenerative changes lower thoracic spine. CT ABDOMEN PELVIS FINDINGS Hepatobiliary: Gallbladder and liver normal appearance Pancreas: Normal appearance Spleen: Normal appearance Adrenals/Urinary Tract: Adrenal glands normal appearance. BILATERAL nonobstructing renal calculi largest 8 mm diameter inferior pole RIGHT kidney. No renal mass or hydronephrosis. No ureteral calcification or dilatation. Bladder unremarkable. Stomach/Bowel: Jejunostomy tube LEFT mid abdomen. Appendiceal base normal appearance. Prior gastric surgery, question version of gastric pull up. Bowel loops unremarkable. Vascular/Lymphatic: Atherosclerotic calcification aorta including origin of the SMA, which appears approximately 50% narrowed. No adenopathy. Reproductive: Unremarkable prostate gland and seminal vesicles Other: Small RIGHT inguinal hernia containing fat. No free air or free fluid. No inflammatory process. Musculoskeletal: Degenerative disc disease changes greatest at L5-S1. No acute osseous findings. IMPRESSION: Esophageal stent extending from the level of the clavicular heads into the distal esophagus. Mid to distal esophageal wall thickening without evidence of perforation; potentially a portion of this could be related to a gastric pull up since there is evidence of gastric surgery, though t such a procedure is not listed in the surgical history in the chart. Chronic RIGHT pleural effusion with mild RIGHT lower lobe atelectasis. BILATERAL nonobstructing renal calculi. Small RIGHT inguinal hernia containing fat. No acute intrathoracic, intra-abdominal or intrapelvic abnormalities. Specifically, no evidence of esophageal perforation. Aortic Atherosclerosis (ICD10-I70.0). Electronically Signed   By: Lavonia Dana M.D.    On: 12/20/2019 09:39   CT ABDOMEN W CONTRAST  Result Date: 01/05/2020 CLINICAL DATA:  Abdominal pain, fever, esophageal cancer EXAM: CT CHEST AND ABDOMEN WITH CONTRAST TECHNIQUE: Multidetector CT imaging of the chest and abdomen was performed following the standard protocol  during bolus administration of intravenous contrast. CONTRAST:  111mL OMNIPAQUE IOHEXOL 300 MG/ML  SOLN COMPARISON:  12/20/2019 FINDINGS: CT CHEST FINDINGS Cardiovascular: Extensive multi-vessel coronary artery calcification. Coronary artery stenting has likely been performed within the left anterior descending, first diagonal, and right coronary arteries, however, this is not well assessed on this exam. Cardiac size is within normal limits. No pericardial effusion. Central pulmonary arteries are of normal caliber. Thoracic aorta is unremarkable. Right internal jugular chest port is seen with its tip within the terminal superior vena cava. Mediastinum/Nodes: Surgical changes of esophagectomy and gastric pull-up are identified. Since the prior examination, the esophageal stent has been removed. Oral contrast opacifies the proximal, intrathoracic stomach. There is no extraluminal stent shin of contrast to suggest a leak. There is no pathologic thoracic adenopathy. Lungs/Pleura: Since the prior examination, there has developed extensive peribronchial nodular infiltrate within the basilar right middle lobe and right lower lobe as well as subtotal collapse of the right lower lobe suspicious for changes of atypical infection or aspiration. Moderate right pleural effusion has increased in size since prior examination. Left lung is clear. No pneumothorax. The central airways are widely patent. Musculoskeletal: There are punctate foci of gas identified within the dorsal paraspinal musculature, within the spinal canal, and within the right paraspinal subpleural space at the level of T6-T7. This is of unclear etiology though could be seen in the  setting of direct trauma or intervention such as thoracentesis. The osseous structures are unremarkable. Specifically, no associated fracture is identified. CT ABDOMEN FINDINGS Hepatobiliary: Cholelithiasis without pericholecystic inflammatory change identified. Liver unremarkable. Pancreas: Unremarkable Spleen: Unremarkable Adrenals/Urinary Tract: 7 mm nonobstructing calculus noted within the lower pole of the right kidney. Multiple nonobstructing calculi within the left kidney measuring up to 12 mm within the lower pole of the left kidney. The kidneys are otherwise unremarkable. Adrenal glands are normal. Stomach/Bowel: Left mid abdominal percutaneous jejunostomy noted. The visualized large and small bowel are otherwise unremarkable. Vascular/Lymphatic: No pathologic adenopathy within the abdomen and upper retroperitoneum. The abdominal vasculature is unremarkable Other: None significant Musculoskeletal: No acute or significant osseous findings. IMPRESSION: 1. Surgical changes of esophagectomy and gastric pull-up. No evidence of leak. 2. Interval development of extensive peribronchial nodular infiltrate within the basilar right middle lobe and right lower lobe as well as subtotal collapse of the right lower lobe suspicious for changes of atypical infection or aspiration. 3. Moderate right pleural effusion, increased in size since prior examination. 4. Punctate foci of gas identified within the dorsal paraspinal musculature, within the spinal canal, and within the right paraspinal subpleural space at the level of T6-T7. This is of unclear etiology though could be seen in the setting of direct trauma or intervention such as thoracentesis. 5. Cholelithiasis without pericholecystic inflammatory change. 6. Bilateral nonobstructing nephrolithiasis. Electronically Signed   By: Fidela Salisbury MD   On: 01/05/2020 23:29   CT ABDOMEN PELVIS W CONTRAST  Result Date: 12/20/2019 CLINICAL DATA:  Esophageal perforation,  chest pain, hiccups; history of esophageal cancer post esophageal stenting, coronary artery disease post MI, CHF, GERD, type II diabetes mellitus, hypertension Chest pain, hiccups EXAM: CT CHEST, ABDOMEN, AND PELVIS WITH CONTRAST TECHNIQUE: Multidetector CT imaging of the chest, abdomen and pelvis was performed following the standard protocol during bolus administration of intravenous contrast. Sagittal and coronal MPR images reconstructed from axial data set. CONTRAST:  150mL OMNIPAQUE IOHEXOL 300 MG/ML SOLN IV. Dilute water-soluble oral contrast. COMPARISON:  Chest radiograph 12/20/2019; CT abdomen 12/04/2019; CT chest 11/13/2019 FINDINGS: CT  CHEST FINDINGS Cardiovascular: RIGHT jugular Port-A-Cath, tip SVC. Atherosclerotic calcifications and stents within coronary arteries. Thoracic vascular structures grossly patent on non targeted exam. No pericardial effusion. Heart size normal. Mediastinum/Nodes: Base of cervical region normal appearance. Scattered normal size mediastinal lymph nodes without thoracic adenopathy. Esophageal stent extends from the level of the clavicular heads into the distal esophagus. Mid to distal esophageal wall thickening. No extravasation of oral contrast to suggest esophageal perforation. Small amount of contrast is seen within the distal esophagus and stent. No mediastinal gas or fluid. Lungs/Pleura: Chronic RIGHT pleural effusion. Chronic atelectasis RIGHT lower lobe. Remaining lungs clear. No infiltrate, LEFT pleural effusion or pneumothorax. No pulmonary mass/nodule. Musculoskeletal: Scattered degenerative changes lower thoracic spine. CT ABDOMEN PELVIS FINDINGS Hepatobiliary: Gallbladder and liver normal appearance Pancreas: Normal appearance Spleen: Normal appearance Adrenals/Urinary Tract: Adrenal glands normal appearance. BILATERAL nonobstructing renal calculi largest 8 mm diameter inferior pole RIGHT kidney. No renal mass or hydronephrosis. No ureteral calcification or  dilatation. Bladder unremarkable. Stomach/Bowel: Jejunostomy tube LEFT mid abdomen. Appendiceal base normal appearance. Prior gastric surgery, question version of gastric pull up. Bowel loops unremarkable. Vascular/Lymphatic: Atherosclerotic calcification aorta including origin of the SMA, which appears approximately 50% narrowed. No adenopathy. Reproductive: Unremarkable prostate gland and seminal vesicles Other: Small RIGHT inguinal hernia containing fat. No free air or free fluid. No inflammatory process. Musculoskeletal: Degenerative disc disease changes greatest at L5-S1. No acute osseous findings. IMPRESSION: Esophageal stent extending from the level of the clavicular heads into the distal esophagus. Mid to distal esophageal wall thickening without evidence of perforation; potentially a portion of this could be related to a gastric pull up since there is evidence of gastric surgery, though t such a procedure is not listed in the surgical history in the chart. Chronic RIGHT pleural effusion with mild RIGHT lower lobe atelectasis. BILATERAL nonobstructing renal calculi. Small RIGHT inguinal hernia containing fat. No acute intrathoracic, intra-abdominal or intrapelvic abnormalities. Specifically, no evidence of esophageal perforation. Aortic Atherosclerosis (ICD10-I70.0). Electronically Signed   By: Lavonia Dana M.D.   On: 12/20/2019 09:39   DG CHEST PORT 1 VIEW  Result Date: Jan 27, 2020 CLINICAL DATA:  Intubation EXAM: PORTABLE CHEST 1 VIEW COMPARISON:  01-27-20 FINDINGS: Endotracheal tube tip is at the level of the clavicular heads. Unchanged position of right chest wall Port-A-Cath. Postsurgical appearance of the esophagus projects over the cardiomediastinal silhouette. No focal airspace consolidation. IMPRESSION: Endotracheal tube tip at the level of the clavicular heads. Electronically Signed   By: Ulyses Jarred M.D.   On: 2020/01/27 22:52   DG CHEST PORT 1 VIEW  Result Date: 01/27/2020 CLINICAL  DATA:  Hypoxia. EXAM: PORTABLE CHEST 1 VIEW COMPARISON:  January 05, 2020. FINDINGS: The heart size and mediastinal contours are within normal limits. No pneumothorax or pleural effusion is noted. Right internal jugular Port-A-Cath is unchanged in position. Left lung is clear. Minimal right infrahilar subsegmental atelectasis or infiltrate is noted. The visualized skeletal structures are unremarkable. IMPRESSION: Minimal right infrahilar subsegmental atelectasis or infiltrate. Electronically Signed   By: Marijo Conception M.D.   On: 2020-01-27 10:33   DG Chest Port 1 View  Result Date: 01/05/2020 CLINICAL DATA:  Shortness of breath EXAM: PORTABLE CHEST 1 VIEW COMPARISON:  12/30/2019 FINDINGS: Cardiac shadow is stable. The lungs are well aerated bilaterally. No focal infiltrate is seen. Contrast material is noted over the medial aspect of the right lung base related to the known gastric pull-through. No focal infiltrate or sizable effusion is seen. Right chest wall port is  again noted and stable. IMPRESSION: No acute abnormality noted. Electronically Signed   By: Inez Catalina M.D.   On: 01/05/2020 09:06   DG CHEST PORT 1 VIEW  Result Date: 12/30/2019 CLINICAL DATA:  Chest pain shortness of breath EXAM: PORTABLE CHEST 1 VIEW COMPARISON:  01/02/2020 FINDINGS: Previously seen esophageal stent is been removed in the interval. Right-sided chest wall port is again seen. Cardiac shadow is stable. The lungs are well aerated bilaterally. No focal infiltrate or effusion is noted. IMPRESSION: No acute abnormality noted. Electronically Signed   By: Inez Catalina M.D.   On: 12/30/2019 08:09   Portable chest 1 View  Result Date: 01/13/2020 CLINICAL DATA:  Status post esophagectomy EXAM: PORTABLE CHEST 1 VIEW COMPARISON:  12/20/2019 FINDINGS: Lungs are clear. No pneumothorax or pleural effusion. Right internal jugular chest port is seen with its tip within the superior vena cava. Esophageal stent graft is seen  unchanged in position extending from the thoracic inlet through the level of the right hilum, presumably across the proximal anastomosis of gastric pull-up. Cardiac size is within normal limits. Bilateral hilar enlargement is again seen and relates to central pulmonary arterial enlargement, better noted on prior CT examination of 12/20/2019. Pulmonary vascularity is normal. No acute bone abnormality. IMPRESSION: 1. No active cardiopulmonary disease. 2. Esophageal stent graft unchanged in position. 3. Central pulmonary arterial enlargement, better noted on prior CT examination. Electronically Signed   By: Fidela Salisbury MD   On: 01/03/2020 19:33   DG Chest Port 1 View  Result Date: 12/20/2019 CLINICAL DATA:  Chest pain and hiccups. EXAM: PORTABLE CHEST 1 VIEW COMPARISON:  12/16/2019 FINDINGS: The lungs are clear without focal pneumonia, edema, pneumothorax or pleural effusion. The cardiopericardial silhouette is within normal limits for size. Esophageal stent device noted. Right Port-A-Cath remains in place. The visualized bony structures of the thorax show now acute abnormality. IMPRESSION: No acute cardiopulmonary findings. Electronically Signed   By: Misty Stanley M.D.   On: 12/20/2019 06:45   DG C-Arm 1-60 Min-No Report  Result Date: 12/14/2019 Fluoroscopy was utilized by the requesting physician.  No radiographic interpretation.   DG ESOPHAGUS W SINGLE CM (SOL OR THIN BA)  Result Date: 01/18/2020 CLINICAL DATA:  Esophageal perforation, status post esophageal stent remove earlier today. Prior partial esophagectomy and gastric pull-through. EXAM: ESOPHOGRAM/BARIUM SWALLOW TECHNIQUE: Single contrast examination was performed using water-soluble contrast. FLUOROSCOPY TIME:  Fluoroscopy Time:  3 minutes and 36 seconds Radiation Exposure Index (if provided by the fluoroscopic device): 68.2 mGy Number of Acquired Spot Images: 0 COMPARISON:  CTs the chest abdomen and pelvis of 12/20/2019. A CT after  esophagram of 11/14/2019 is also reviewed. FINDINGS: Focused, single-contrast exam performed with the patient initially in supine position. Subsequently, images were performed with the patient upright. Patient status post partial esophagectomy and gastric pull-through. There is contrast filling of the gastric pull-through, right of midline. With multiple obliquities, no anterior or left-sided contrast is seen to correspond to the ventral perforation on the 11/14/2019 CT. No obstruction of contrast passage. IMPRESSION: Status post a partial esophagectomy and gastric pull-through. Focused, single-contrast exam performed with postoperative anatomy. No convincing evidence of residual perforation, including at the site of anterior perforation on 11/14/2019 CT. If high ongoing clinical concern, given difficulty in distending the gastric pull-through, repeat chest CT could be performed. Electronically Signed   By: Abigail Miyamoto M.D.   On: 12/26/2019 16:00    Microbiology        Recent Results (from the past  240 hour(s))  Urine Culture     Status: Abnormal   Collection Time: 01/05/20  3:10 PM   Specimen: Urine, Random  Result Value Ref Range Status   Specimen Description URINE, RANDOM  Final   Special Requests NONE  Final   Culture (A)  Final    <10,000 COLONIES/mL INSIGNIFICANT GROWTH Performed at [facility], [place] [address] [place], [place] [address]    Report Status 02/01/20 FINAL  Final  Culture, blood (Routine X 2) w Reflex to ID Panel     Status: None (Preliminary result)   Collection Time: 01/05/20  3:40 PM   Specimen: BLOOD RIGHT ARM  Result Value Ref Range Status   Specimen Description BLOOD RIGHT ARM  Final   Special Requests   Final    BOTTLES DRAWN AEROBIC AND ANAEROBIC Blood Culture results may not be optimal due to an excessive volume of blood received in culture bottles   Culture   Final    NO GROWTH 2 DAYS Performed at [facility],  [place] [address] [place], [place] [address]    Report Status PENDING  Incomplete  Culture, blood (Routine X 2) w Reflex to ID Panel     Status: None (Preliminary result)   Collection Time: 01/05/20  3:50 PM   Specimen: BLOOD LEFT ARM  Result Value Ref Range Status   Specimen Description BLOOD LEFT ARM  Final   Special Requests   Final    BOTTLES DRAWN AEROBIC AND ANAEROBIC Blood Culture results may not be optimal due to an excessive volume of blood received in culture bottles   Culture   Final    NO GROWTH 2 DAYS Performed at [facility], [place] [address], [place], [place] [address]    Report Status PENDING  Incomplete  Expectorated sputum assessment w rflx to resp cult     Status: None   Collection Time: 01/05/20  8:35 PM   Specimen: Expectorated Sputum  Result Value Ref Range Status   Specimen Description EXPECTORATED SPUTUM  Final   Special Requests NONE  Final   Sputum evaluation   Final    Sputum specimen not acceptable for testing.  Please recollect.   NOTIFIED J,SUAZO RN @2132  01/05/20 EB Performed at [facility] [address], [place], [place] [address]    Report Status 01/05/2020 FINAL  Final    Lab Basic Metabolic Panel:      Recent Labs  Lab 01-Feb-2020 0814 02-01-20 2031 02/01/2020 2107  NA 131* 134* 134*  K 3.6 3.0* 3.6  CL 97*  --  99  CO2 25  --  23  GLUCOSE 238*  --  181*  BUN 17  --  25*  CREATININE 0.58*  --  0.80  CALCIUM 7.5*  --  7.7*  MG 2.0  --   --   PHOS 3.2  --   --    Liver Function Tests:    Recent Labs  Lab 2020-02-01 0814  ALBUMIN 1.3*   No results for input(s): LIPASE, AMYLASE in the last 168 hours. No results for input(s): AMMONIA in the last 168 hours. CBC:      Recent Labs  Lab 01/04/20 0920 02/01/20 0814 02-01-20 2031  WBC 9.0 7.5  --   HGB 8.6* 8.4* 16.3  HCT 27.1* 26.3* 48.0  MCV 85.2 84.0  --   PLT 309 329  --    Cardiac Enzymes: No results for input(s): CKTOTAL, CKMB,  CKMBINDEX,  TROPONINI in the last 168 hours. Sepsis Labs:     Recent Labs  Lab 01/04/20 0920 01-15-2020 0814  WBC 9.0 7.5    Procedures/Operations  6/28 Esophagectomy 8/24 Esophageal stenting 9/8 removal of esophageal stent  9/9 c/o epigastric fullness, continued on EN per Jtube  9/11 not yet tolerating POs well 9/15 SOB 15-Jan-2023 worsening SOB + hypoxia, on NRB. Concern for aspiration. Moving to ICU, PCCM consulted January 15, 2023 22:33 time and date of death  Elgie Collard 16-Jan-2020, 12:31 PM     Communication Routing History  None  No questionnaires available.

## 2020-01-14 ENCOUNTER — Encounter (HOSPITAL_COMMUNITY): Payer: BC Managed Care – PPO

## 2020-01-21 NOTE — Progress Notes (Signed)
[name] Progress Note Patient Name: Steven Crane DOB: 08/20/1960 MRN: [id number]   Date of Service  01-16-20  HPI/Events of Note  Patient is s/p removal of esophageal stent with aspiration pneumonia and acute hypoxemic respiratory failure,  RR low 40's.  eICU Interventions  Will  Check stat ABG to assess potential need for intubation, NIV is not an option given esophageal issues and recent aspiration.        Frederik Pear January 16, 2020, 7:47 PM

## 2020-01-21 NOTE — Op Note (Signed)
Intubation Procedure Note  Steven Crane  [id number]  03/23/1961  Date:02-05-20  Time:10:06 PM   Provider Performing:Makaylie Dedeaux R Earlie Server    Procedure: Intubation (66440)  Indication(s) Respiratory Failure  Consent Unable to obtain consent due to emergent nature of procedure.   Anesthesia Etomidate   Time Out Verified patient identification, verified procedure, site/side was marked, verified correct patient position, special equipment/implants available, medications/allergies/relevant history reviewed, required imaging and test results available.   Sterile Technique Usual hand hygeine, masks, and gloves were used   Procedure Description Patient positioned in bed supine.  Sedation given as noted above.  Patient was intubated with endotracheal tube using 3 miller .  View was Grade 2 only posterior commissure .  Number of attempts was 1.  Colorimetric CO2 detector was consistent with tracheal placement. Pt has very poor dentition    Complications/Tolerance None; patient tolerated the procedure well. Chest X-ray is ordered to verify placement.   EBL Minimal   Specimen(s) None

## 2020-01-21 NOTE — Progress Notes (Signed)
Pharmacy Antibiotic Note  Steven Crane is a 59 y.o. male admitted on 12/23/2019 with failure to thrive, now w/ concern for  sepsis given on nonrebreather with sats at 90% and RR in the 40s, temp elevated overnight.  Pharmacy has been consulted for vancomycin dosing.  Plan: Vancomycin 1500mg  x1 then 1000mg  IV every 8 hours.  Goal trough 15-20 mcg/mL.  Height: 6\' 2"  (188 cm) Weight: 99.8 kg (220 lb 0.3 oz) IBW/kg (Calculated) : 82.2  Temp (24hrs), Avg:100.5 F (38.1 C), Min:99.2 F (37.3 C), Max:103.2 F (39.6 C)  Recent Labs  Lab 12/31/19 0400 01/04/20 0920  WBC 9.4 9.0  CREATININE 0.44*  --     Estimated Creatinine Clearance: 125.4 mL/min (A) (by C-G formula based on SCr of 0.44 mg/dL (L)).    Allergies  Allergen Reactions  . Adhesive [Tape] Rash    Thank you for allowing pharmacy to be a part of this patient's care.  Wynona Neat, PharmD, BCPS  February 05, 2020 7:41 AM

## 2020-01-21 NOTE — Progress Notes (Signed)
   01/25/2020 0739  Vitals  Pulse Rate (!) 118  ECG Heart Rate (!) 118  Resp (!) 44  Oxygen Therapy  SpO2 92 %  MEWS Score  MEWS Temp 2  MEWS Systolic 0  MEWS Pulse 2  MEWS RR 3  MEWS LOC 0  MEWS Score 7  MEWS Score Color Red  dr lightfoot at bedside will transfer to [facility]

## 2020-01-21 NOTE — Code Documentation (Signed)
  Patient Name: Steven Crane   MRN: [id number]   Date of Birth/ Sex: 1960/09/29 , male      Admission Date: 01/11/2020  Attending Provider: Lajuana Matte, MD  Primary Diagnosis: Hx of esophagectomy [phone], Z90.49]   Indication: Pt was in his usual state of health until this PM, when he was noted to be Asystole. Code blue was subsequently called. At the time of arrival on scene, ACLS protocol was underway.   Technical Description:  - CPR performance duration:  15 minute  - Was defibrillation or cardioversion used? No   - Was external pacer placed? Yes  - Was patient intubated pre/post CPR? Yes   Medications Administered: Y = Yes; Blank = No Amiodarone    Atropine    Calcium  Y  Epinephrine  Y  Lidocaine    Magnesium    Norepinephrine    Phenylephrine    Sodium bicarbonate    Vasopressin    Other    Post CPR evaluation:  - Final Status - Was patient successfully resuscitated ? No   Miscellaneous Information:  - Time of death:  10:33 PM  - Primary team notified?  Yes  - Family Notified? Yes     Lacinda Axon, MD   2020/01/22, 10:40 PM

## 2020-01-21 NOTE — Significant Event (Signed)
Critical care cardiac arrest note  Patient had been intubated without difficulty.  Patient developed postprocedural hypotension.  Initially he had atrial flutter with RVR and amnio had been ordered but was not given prior to this event.  He had been started on phenylephrine and rapidly titrated up.  For intubation patient was given a total of 10 mg of etomidate.  Propofol was initiated but stopped almost immediately.  Patient then became bradycardic and went into PEA cardiac arrest.  As this occurred the chest x-ray was performed.  Endotracheal tube appears to be in good position and no pneumothorax is seen.  Levophed and vasopressin were being initiated at the time of cardiac arrest.  CPR CPR and ACLS protocol were initiated immediately.  He initially had PEA bradycardic arrest that progressed to asystole.  Once in asystole, the patient did not achieve a rhythm again.  Bedside echocardiogram was performed did not show a pericardial effusion.  Patient was mildly alkalotic just prior to this event.  Patient was pronounced dead at 22:33 hours.  Family is in route to the hospital.

## 2020-01-21 NOTE — Progress Notes (Signed)
   01-18-2020 0707  Vitals  BP (!) 164/73  MAP (mmHg) 98  Pulse Rate (!) 112  ECG Heart Rate (!) 112  Resp (!) 43  Oxygen Therapy  SpO2 90 %  O2 Device Non-rebreather Mask  MEWS Score  MEWS Temp 0  MEWS Systolic 0  MEWS Pulse 2  MEWS RR 3  MEWS LOC 0  MEWS Score 5  MEWS Score Color Red  Provider Notification  Provider Name/Title tessa conte pa  Date Provider Notified 18-Jan-2020  Time Provider Notified [phone]  Notification Type Rounds  Notification Reason Change in status  Response See new orders  Date of Provider Response 18-Jan-2020  Time of Provider Response 0712  tessa conte PA arrived on floor notified her of pt on nonrebreather sats 90% resp 40's temp remain elevated throughout the nite. New orders rec'd to transfer to icu

## 2020-01-21 NOTE — Progress Notes (Addendum)
[address]       [place] [address]             [phone]      9 Days Post-Op Procedure(s) (LRB): ESOPHAGOGASTRODUODENOSCOPY (EGD) REMOVAL OF ESOPHAGEAL STENT (N/A) Subjective: Short of breath and having some back pain this morning.   Objective: Vital signs in last 24 hours: Temp:  [99.2 F (37.3 C)-101.8 F (38.8 C)] 100 F (37.8 C) (09/16 0341) Pulse Rate:  [88-112] 112 (09/16 0707) Cardiac Rhythm: Sinus tachycardia (09/16 0709) Resp:  [14-43] 43 (09/16 0707) BP: (129-164)/(68-80) 164/73 (09/16 0707) SpO2:  [90 %-100 %] 90 % (09/16 0707) Weight:  [99.8 kg] 99.8 kg (09/16 0341)     Intake/Output from previous day: 09/15 0701 - 09/16 0700 In: 3067.8 [NG/GT:3067.8] Out: 1250 [Urine:1250] Intake/Output this shift: No intake/output data recorded.  General appearance: alert, cooperative and moderate distress Heart: sinus tachycardia Lungs: coarse breath sounds Abdomen: soft, non-tender; bowel sounds normal; no masses,  no organomegaly Extremities: extremities normal, atraumatic, no cyanosis or edema Wound: clean and dry  Lab Results: Recent Labs    01/04/20 0920  WBC 9.0  HGB 8.6*  HCT 27.1*  PLT 309   BMET: No results for input(s): NA, K, CL, CO2, GLUCOSE, BUN, CREATININE, CALCIUM in the last 72 hours.  PT/INR: No results for input(s): LABPROT, INR in the last 72 hours. ABG    Component Value Date/Time   PHART 7.462 (H) 10/19/2019 0510   HCO3 23.7 10/19/2019 0510   TCO2 26 10/18/2019 1640   ACIDBASEDEF 3.9 (H) 10/18/2019 1809   O2SAT 98.4 10/19/2019 0510   CBG (last 3)  Recent Labs    01/01/2020 0003 01/09/2020 0337 01/09/2020 0620  GLUCAP 160* 165* 171*    Assessment/Plan: S/P Procedure(s) (LRB): ESOPHAGOGASTRODUODENOSCOPY (EGD) REMOVAL OF ESOPHAGEAL STENT (N/A)  1. Pulm- on a non re-breather this morning with saturations of 90%. Alert and oriented. Transfer to ICU is being arranged. CCM has been notified. Broad spectrum  abx have been started. CT scan of the chest shows possible aspiration pneumonia.  2. CV-BP elevated and sinus tachycardia. Hx of afib.  3. Renal-creatinine 0.44 4. H and H stable 5. Blood glucose moderately controlled   Plan: Transfer to the ICU. Empiric ABX started and cultures sent.    LOS: 13 days    Sharlene Dory 12/30/2019  We will transfer to ICU given change in respiratory status. Patient will be n.p.o. given aspiration pneumonia evident on cross-sectional imaging We will cover with broad-spectrum antibiotics. Continue tube feeds  Lulia Schriner O Kathlene Yano

## 2020-01-21 NOTE — Progress Notes (Signed)
PT Cancellation Note  Patient Details Name: Steven Crane MRN: [id number] DOB: 05-30-1960   Cancelled Treatment:    Reason Eval/Treat Not Completed: Medical issues which prohibited therapy. Pt awaiting transfer to ICU due to decline in respiratory status, suspected aspiration PNA. Pt now on NRB with SpO2 90%. PT to hold today and will follow up tomorrow.   Lorriane Shire 13-Jan-2020, 8:22 AM   Lorrin Goodell, PT  Office # [phone] Pager [phone]

## 2020-01-21 NOTE — TOC Initial Note (Signed)
Transition of Care [facility]) - Initial/Assessment Note    Patient Details  Name: Steven Crane MRN: [id number] Date of Birth: February 20, 1961  Transition of [facility]) CM/SW Contact:    Verdell Carmine, RN Phone Number: 01/14/20, 9:03 AM  Clinical Narrative:                 Patient with esophageal cancer esophagus surgery, multiple removal of stent, now with questionable aspiration. . Plan to move to ICU due to hypoxia, may require intubation.  Will likely need SNF placement CSW and CM to follow for needs  Expected Discharge Plan: [place] Barriers to Discharge: Continued Medical Work up   Patient Goals and CMS Choice        Expected Discharge Plan and Services Expected Discharge Plan: [place]                                              Prior Living Arrangements/Services     Patient language and need for interpreter reviewed:: Yes        Need for Family Participation in Patient Care: Yes (Comment)     Criminal Activity/Legal Involvement Pertinent to Current Situation/Hospitalization: No - Comment as needed  Activities of Daily Living Home Assistive Devices/Equipment: CBG Meter, Walker (specify type) ADL Screening (condition at time of admission) Patient's cognitive ability adequate to safely complete daily activities?: Yes Is the patient deaf or have difficulty hearing?: No Does the patient have difficulty seeing, even when wearing glasses/contacts?: No Does the patient have difficulty concentrating, remembering, or making decisions?: No Patient able to express need for assistance with ADLs?: Yes Does the patient have difficulty dressing or bathing?: No Independently performs ADLs?: Yes (appropriate for developmental age) Does the patient have difficulty walking or climbing stairs?: No Weakness of Legs: None Weakness of Arms/Hands: None  Permission Sought/Granted                  Emotional Assessment        Orientation: : Oriented to Self Alcohol / Substance Use: Not Applicable Psych Involvement: No (comment)  Admission diagnosis:  Hx of esophagectomy [X52.841, Z90.49] Patient Active Problem List   Diagnosis Date Noted  . Hx of esophagectomy 01/01/2020  . Pressure injury of skin 11/16/2019  . Esophageal perforation 11/14/2019  . 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 Severe malnutrition ([place]) 10/20/2019  . Esophageal cancer ([place]) 10/18/2019  . Failure to thrive (0-17) 09/21/2019  . Neutropenia, drug-induced ([place]) 08/23/2019  . 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 GERD (gastroesophageal reflux disease)   . Esophageal dysphagia   . Displaced oblique fracture of shaft of left tibia, initial encounter for closed fracture 08/21/2018  . Displaced comminuted fracture of shaft of left tibia, initial encounter for closed fracture 08/21/2018  . Unstable angina ([place]) 08/27/2013  . CAD S/P percutaneous coronary angioplasty 2011 08/06/2013  . Chest pain 08/06/2013  . Kidney stone 04/05/2013  . Reactive airways dysfunction syndrome ([place]) 11/30/2012  . Hyperlipemia 09/25/2012  . Diabetes mellitus without complication ([place]) 32/44/0102  . Atherosclerotic heart disease 09/25/2012  . Essential hypertension, benign 09/25/2012  . Morbid obesity ([place]) 09/25/2012  . Psoriasis 09/25/2012  . History of DVT (deep vein thrombosis) 09/25/2012   PCP:  Erven Colla, DO Pharmacy:   [facility]  [place] - [address] [place] [address] Phone: [phone] Fax: [phone]     Social Determinants of Health (SDOH) Interventions    Readmission Risk Interventions Readmission Risk Prevention Plan 10/25/2019  Transportation Screening Complete  PCP or Specialist Appt within 5-7 Days Complete  Home Care Screening Complete  Medication Review (RN CM) Complete  Some recent data might be hidden

## 2020-01-21 NOTE — Progress Notes (Signed)
[facility] Progress Note Patient Name: PRAVEEN COIA DOB: 01-28-1961 MRN: [id number]   Date of Service  Jan 23, 2020  HPI/Events of Note  Patient now somnolent.  eICU Interventions  PCCM boots on the ground requested to come and intubate patient.        Kerry Kass Loran Auguste 23-Jan-2020, 9:41 PM

## 2020-01-21 NOTE — Progress Notes (Signed)
   01-25-20 2219  Clinical Encounter Type  Visited With Patient (Responded to code blue)  Visit Type Code  Referral From Nurse  Consult/Referral To Chaplain  Chaplain responded to code blue. Family was not present.This note was prepared by Jeanine Luz, M.Div..  For questions please contact by phone [phone].

## 2020-01-21 NOTE — Progress Notes (Signed)
SLP Cancellation Note  Patient Details Name: Steven Crane MRN: [id number] DOB: [birth year]   Cancelled treatment:       Reason Eval/Treat Not Completed: Patient not medically ready (Pt has been transferred to [place] due to decline in respiratory status. Pt currently requires NRB. Aspiration is suspected by medical team; this is likely secondary to esophageal stasis leading to aspiration after the deglutition since the pt's oropharyngeal swallow mechanism is WNL.)  Tanga [place] I. Hardin Negus, [place], [place] Office number [phone] Pager [place] Feb 04, 2020, 11:19 AM

## 2020-01-21 NOTE — Progress Notes (Signed)
Nutrition Follow-up  DOCUMENTATION CODES:   Severe malnutrition in context of chronic illness  INTERVENTION:   Tube Feeding via J-tube:  Transition back to 24 hour continue feedings at this time Osmolite 1.5 at 70 ml/hr Pro-Source TF 45 mL TID Provides 2640 kcals, 146 g of protein and 1277 mL of free water Meets 100% estimated calorie and protein needs   NUTRITION DIAGNOSIS:   Severe Malnutrition related to chronic illness (esophageal cancer) as evidenced by severe muscle depletion, severe fat depletion, percent weight loss.  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 s/p esophagoscopy, robotic assisted laparoscopy, robotic assisted thoracoscopy, Ivor Lewis esophagectomy, pyloromyotomy, laparoscopic jejunostomy tube placement 64 French as well as intercostal nerve block 8/88 complicated by contained esophageal perforation s/p esophageal stent placement on 12/14/19 who now admits with FTT and hiccups  9/07 - s/p removal of esophageal stent 9/08 - clear liquid diet 9/10 - dysphagia 1 diet with thin liquids 9/14- s/p MBSS- advanced to dysphagia 2 diet with thin liquids 9/16- Transferred to ICU, worsening respiratory status, concern for aspiration  No recorded po intake since 9/13. Now NPO due to concern for aspiration TF held currently but plan to resume.   Pt is on reglan. No complaints regarding abdomen at present  J-tube is leaking around insertion; significant "neon green" drainage. MD is aware; tube ok to use per MD. Noted WOC RN has been consulted and  evaluated and is recommending tube be exchanged.   Labs: sodium 131 (L) Meds: ss novolog, novolog TID with meals, levemir   Diet Order:   Diet Order            Diet NPO time specified  Diet effective now                 EDUCATION NEEDS:   Education needs have been addressed  Skin:  Skin Assessment: Skin Integrity Issues: Skin Integrity Issues:: Stage II Stage II: buttocks Other: skin irritation at G-J tube site  Last BM:  9/15  Height:   Ht Readings from Last 1 Encounters:  12/25/19 6\' 2"  (1.88 m)    Weight:   Wt Readings from Last 1 Encounters:  2020-01-21 99.8 kg    Ideal Body Weight:  86.3 kg  BMI:  Body mass index is 28.25 kg/m.  Estimated Nutritional Needs:   Kcal:  2500-2800kcal/day  Protein:  125-140g/day  Fluid:  2.6-2.9L/day   Kerman Passey MS, RDN, LDN, CNSC Registered Dietitian III Clinical Nutrition RD Pager and On-Call Pager Number Located in [place]

## 2020-01-21 NOTE — Consult Note (Signed)
NAME:  Steven Crane, MRN:  [id number], DOB:  1960/09/30, LOS: 17 ADMISSION DATE:  01/07/2020, CONSULTATION DATE:  23-Jan-2020 REFERRING MD:  Kipp Brood - CVTS, CHIEF COMPLAINT:  Hypoxia   Brief History   59 yo M s/p esophagectomy in June 2021, now s/p removal of esophageal stent who has developed worsening SOB, hypoxia-- now on NRB with SpO2 90%. Suspected aspiration. PCCM consulted amidst transfer to ICU given respiratory status   History of present illness   59 yo M PMH CAD, HTN, CHF, DM2, esophageal cancer s/p esophagectomy 09/2019 + small esophageal leak 10/2019 + esophageal stenting 12/14/19 + removal of stent 12/29/19 has developed worsening SOB with concomitant hypoxia 01/05/20. Pt has been escalated to NRB, with SpO2 now 90%. Suspicion for aspiration per primary team in setting of hx dysphagia. Pt is being transferring to ICU for closer monitoring and potential escalation of care, PCCM is consulted in this setting.   Past Medical History  Esophageal cancer HTN CAD DM2 Anxiety  HA HLD CHF Obesity Psoriasis  DVT  Significant Hospital Events   9/8 removal of esophageal stent  9/9 c/o epigastric fullness, continued on EN per Jtube  9/11 not yet tolerating POs well 9/15 SOB 9/16 worsening SOB + hypoxia, on NRB. Concern for aspiration. Moving to ICU, PCCM consulted   Consults:  PCCM   Procedures:  9/8 removal of esophageal stent   Significant Diagnostic Tests:  9/16 CXR>   Micro Data:  9/3 SARS Cov2> neg 9/15 expectorated sputum not acceptable for testing   Antimicrobials:  Vanc Zosyn   Interim history/subjective:  Worsening respiratory status with increased hypoxia and SOB.  On NRB Febrile, "feels lousy" Moving to ICU   Objective   Blood pressure (!) 158/72, pulse (!) 118, temperature (!) 103.2 F (39.6 C), temperature source Oral, resp. rate (!) 44, height '6\' 2"'  (1.88 m), weight 99.8 kg, SpO2 92 %.        Intake/Output Summary (Last 24 hours) at 2020-01-23  0750 Last data filed at 2020/01/23 0600 Gross per 24 hour  Intake 3067.83 ml  Output 1250 ml  Net 1817.83 ml   Filed Weights   01/02/20 0346 01/05/20 0331 January 23, 2020 0341  Weight: 99.6 kg 100.1 kg 99.8 kg    Examination: General: Chronically and acutely ill appearing adult M, appears older than stated age. Reclined in bed, mild distress on NRB  HENT: NCAT pink mmm. Anicteric sclera Lungs: Scattered upper rhonchi. Diminished bibasilar sounds symmetrical chest expansion. No accessory muscle use  Cardiovascular: tachycardic rate. Brisk cap refill, 2+ peripheral pulse Abdomen: Round soft non-tender Extremities: No obvious joint deformity, symmetrical bulk and tone no cyanosis or clubbing  Neuro: AAOx3 following commands GU: defer Skin: R port a cath   Resolved Hospital Problem list     Assessment & Plan:   Hypoxic respiratory failure  Aspiration Pneumonia  - transfer to ICU for close monitoring, high intubation risk  - STAT CXR - NPO - continue supplemental O2/ NRB for sat goal > 92 - can trial HHFNC, would avoid BiPAP given ongoing aspiration  - zosyn/ vanc started 9/16 - aspiration precautions  - CBC/ BMET now - trend WBC/ fever curve, with tylenol prn   Robic assisted esophagectomy 6/2021complicated by esophageal leak s/p esophageal stent placement 8/24 with stent removal 9/7 - per TCTS  Dysphagia - NPO for now - SLP following  - TF per Jtube   DMT2 - SSI resistant, levemir,   Hx HFrEF, HTN, HLD - continue lopressor -  remains normotensive  GERD - PPI  Best practice:  Diet: NPO Pain/Anxiety/Delirium protocol (if indicated): na VAP protocol (if indicated): na DVT prophylaxis: lovenox GI prophylaxis: ppi Glucose control: levemir + SSI  Mobility: BR Code Status: Full  Family Communication: per primary. Pt updated at bedside by PCCM Disposition: transfer to ICU   Labs   CBC: Recent Labs  Lab 12/31/19 0400 01/04/20 0920  WBC 9.4 9.0  HGB 9.5*  8.6*  HCT 30.2* 27.1*  MCV 86.8 85.2  PLT 327 676    Basic Metabolic Panel: Recent Labs  Lab 12/31/19 0400  NA 129*  K 4.3  CL 96*  CO2 26  GLUCOSE 205*  BUN 7  CREATININE 0.44*  CALCIUM 7.6*   GFR: Estimated Creatinine Clearance: 125.4 mL/min (A) (by C-G formula based on SCr of 0.44 mg/dL (L)). Recent Labs  Lab 12/31/19 0400 01/04/20 0920  WBC 9.4 9.0    Liver Function Tests: No results for input(s): AST, ALT, ALKPHOS, BILITOT, PROT, ALBUMIN in the last 168 hours. No results for input(s): LIPASE, AMYLASE in the last 168 hours. No results for input(s): AMMONIA in the last 168 hours.  ABG    Component Value Date/Time   PHART 7.462 (H) 10/19/2019 0510   PCO2ART 33.7 10/19/2019 0510   PO2ART 94.5 10/19/2019 0510   HCO3 23.7 10/19/2019 0510   TCO2 26 10/18/2019 1640   ACIDBASEDEF 3.9 (H) 10/18/2019 1809   O2SAT 98.4 10/19/2019 0510     Coagulation Profile: No results for input(s): INR, PROTIME in the last 168 hours.  Cardiac Enzymes: No results for input(s): CKTOTAL, CKMB, CKMBINDEX, TROPONINI in the last 168 hours.  HbA1C: Hgb A1c MFr Bld  Date/Time Value Ref Range Status  01/16/2020 09:00 PM 7.2 (H) 4.8 - 5.6 % Final    Comment:    (NOTE) Pre diabetes:          5.7%-6.4%  Diabetes:              >6.4%  Glycemic control for   <7.0% adults with diabetes   09/21/2019 02:14 PM 7.8 (H) 4.8 - 5.6 % Final    Comment:    (NOTE) Pre diabetes:          5.7%-6.4% Diabetes:              >6.4% Glycemic control for   <7.0% adults with diabetes     CBG: Recent Labs  Lab 01/05/20 2034 01/05/20 2129 January 24, 2020 0003 24-Jan-2020 0337 01-24-20 0620  GLUCAP 196* 190* 160* 165* 171*    Review of Systems:    + SOB  + cough  - epigastric pain  + difficulty swallowing - chest pain - palpitations  - Dizziness  - LOC  + weakness  + fever   + weight loss  - painful urination  - bloody BM   Past Medical History  He,  has a past medical history of  Anxiety, Arthritis, Asthma, CHF (congestive heart failure) ([place]) (02/2010), Chronic bronchitis ([place]), Chronic lower back pain, Closed head injury (7209), Coronary artery disease (May 2015), Depression, DVT (deep venous thrombosis) ([place]) (2008), Esophageal cancer ([place]), GERD (gastroesophageal reflux disease), Headache, History of kidney stones, Hyperlipemia, Hypertension, Hypertriglyceridemia, LV dysfunction, Morbid obesity ([place]), Myocardial infarction ([place]) (02/2010), Psoriasis, and Type II diabetes mellitus ([place]).   Surgical History    Past Surgical History:  Procedure Laterality Date  . BIOPSY  06/17/2019   Procedure: BIOPSY;  Surgeon: [name], MD;  Location: [facility];  Service:  Endoscopy;;  . COLONOSCOPY  01/06/2012   Dr. [name]: suboptimal prep. normal exam. next colonoscopy in 10 years.   . CORONARY ANGIOPLASTY WITH STENT PLACEMENT  02/2010; 08/27/2013   "1 + 3"   . ESOPHAGEAL STENT PLACEMENT N/A 12/14/2019   Procedure: ESOPHAGEAL STENT PLACEMENT;  Surgeon: Lajuana Matte, MD;  Location: [place];  Service: Thoracic;  Laterality: N/A;  . ESOPHAGOGASTRODUODENOSCOPY N/A 10/18/2019   Procedure: ESOPHAGOGASTRODUODENOSCOPY (EGD);  Surgeon: Lajuana Matte, MD;  Location: [facility] OR;  Service: Thoracic;  Laterality: N/A;  . ESOPHAGOGASTRODUODENOSCOPY N/A 12/14/2019   Procedure: ESOPHAGOGASTRODUODENOSCOPY (EGD);  Surgeon: Lajuana Matte, MD;  Location: [facility] OR;  Service: Thoracic;  Laterality: N/A;  . ESOPHAGOGASTRODUODENOSCOPY N/A 01/12/2020   Procedure: ESOPHAGOGASTRODUODENOSCOPY (EGD) REMOVAL OF ESOPHAGEAL STENT;  Surgeon: Lajuana Matte, MD;  Location: [facility] OR;  Service: Thoracic;  Laterality: N/A;  . ESOPHAGOGASTRODUODENOSCOPY (EGD) WITH PROPOFOL N/A 06/17/2019   Procedure: ESOPHAGOGASTRODUODENOSCOPY (EGD) WITH PROPOFOL;  Surgeon: [name], MD;  Location: [facility];  Service: Endoscopy;  Laterality: N/A;  11:15am  . FRACTURE SURGERY    . INTERCOSTAL NERVE BLOCK   10/18/2019   Procedure: INTERCOSTAL NERVE BLOCK;  Surgeon: Lajuana Matte, MD;  Location: [place];  Service: Thoracic;;  . IR CM INJ ANY COLONIC TUBE W/FLUORO  11/17/2019  . IR IMAGING GUIDED PORT INSERTION  08/03/2019  . IR REPLC DUODEN/JEJUNO TUBE PERCUT W/FLUORO  12/04/2019  . KNEE ARTHROSCOPY Left 1981  . LEFT HEART CATHETERIZATION WITH CORONARY ANGIOGRAM N/A 08/27/2013   Procedure: LEFT HEART CATHETERIZATION WITH CORONARY ANGIOGRAM;  Surgeon: [name], MD;  Location: [facility] CATH LAB;  Service: Cardiovascular;  Laterality: N/A;  . NODE DISSECTION  10/18/2019   Procedure: NODE DISSECTION;  Surgeon: Lajuana Matte, MD;  Location: [place];  Service: Thoracic;;  . right wrist fracture surgery     . TIBIA IM NAIL INSERTION Left 08/21/2018   Procedure: INTRAMEDULLARY (IM) NAIL TIBIAL;  Surgeon: Rod Can, MD;  Location: [facility];  Service: Orthopedics;  Laterality: Left;     Social History   reports that he has never smoked. He has quit using smokeless tobacco.  His smokeless tobacco use included snuff and chew. He reports previous alcohol use. He reports that he does not use drugs.   Family History   His family history includes Coronary artery disease in his father; Diabetes in his father; Diabetes type II in his father; Heart attack in his father; Hypertension in his mother.   Allergies Allergies  Allergen Reactions  . Adhesive [Tape] Rash     Home Medications  Prior to Admission medications   Medication Sig Start Date End Date Taking? Authorizing Provider  acetaminophen (TYLENOL) 160 MG/5ML solution Place 10.2-20.3 mLs (325-650 mg total) into feeding tube every 4 (four) hours as needed for mild pain or moderate pain. 11/23/19  Yes Conte, Tessa N, PA-C  baclofen (LIORESAL) 10 MG tablet Take 1 tablet (10 mg total) by mouth 3 (three) times daily. 12/22/19  Yes Lovena Le, Malena M, DO  blood glucose meter kit and supplies Test glucose once daily. ICD 10 E11.9 09/26/17  Yes [name], MD  chlorproMAZINE (THORAZINE) 25 MG tablet Take 50 mg by mouth 3 (three) times daily as needed. 12/21/19  Yes [provider]  insulin aspart (NOVOLOG) 100 UNIT/ML FlexPen CBG < 70: Implement Hypoglycemia Standing Orders and refer to Hypoglycemia Standing Orders sidebar report  CBG < 120 (dose in units): 0  CBG 120 - 160 (dose  in units): 2  CBG 161 - 200 (dose in units): 4  CBG 201 - 250 (dose in units): 8  CBG 251 - 300 (dose in units): 12  CBG 301 - 350 (dose in units) 16  CBG 351 - 450 (dose in units): 20  CBG > 450 24 units and obtain STAT glucose and notify MD  *this will be a short term medication that will be susceptible to changes when your tube feedings are changes 11/23/19  Yes Harriet Pho, Tessa N, PA-C  insulin aspart (NOVOLOG) 100 UNIT/ML FlexPen Inject 3 Units into the skin 3 (three) times daily with meals. Patient taking differently: Inject 3 Units into the skin 3 (three) times daily with meals. Pt using sliding scale 12/13/19  Yes Taylor, Malena M, DO  insulin detemir (LEVEMIR) 100 UNIT/ML injection Inject 0.2 mLs (20 Units total) into the skin daily. Patient taking differently: Inject 25 Units into the skin daily.  11/24/19  Yes Harriet Pho, Tessa N, PA-C  Insulin Pen Needle (PEN NEEDLES 3/16") 31G X 5 MM MISC Inject 3 Units as directed 6 (six) times daily. 12/15/19  Yes Lovena Le, Malena M, DO  metoprolol tartrate (LOPRESSOR) 25 MG tablet Place 1 tablet (25 mg total) into feeding tube 2 (two) times daily. Crush finely and insert in Jtube 11/23/19  Yes Conte, Tessa N, PA-C  pantoprazole sodium (PROTONIX) 40 mg/20 mL PACK 22m./ml, 20 ml via J-Tube daily Patient taking differently: Place 40 mg into feeding tube every evening.  11/30/19  Yes Lightfoot, HLucile Crater MD     Critical care time: 40 min     CRITICAL CARE Performed by: GCristal Generous  Total critical care time: 40 minutes  Critical care time was exclusive of separately billable procedures and treating other  patients. Critical care was necessary to treat or prevent imminent or life-threatening deterioration.  Critical care was time spent personally by me on the following activities: development of treatment plan with patient and/or surrogate as well as nursing, discussions with consultants, evaluation of patient's response to treatment, examination of patient, obtaining history from patient or surrogate, ordering and performing treatments and interventions, ordering and review of laboratory studies, ordering and review of radiographic studies, pulse oximetry and re-evaluation of patient's condition.  GEliseo GumMSN, AGACNP-BC [id number] no answer, [id number] 8:13 AM

## 2020-01-21 DEATH — deceased

## 2020-02-07 ENCOUNTER — Other Ambulatory Visit (HOSPITAL_COMMUNITY): Payer: BC Managed Care – PPO

## 2020-02-07 ENCOUNTER — Ambulatory Visit (HOSPITAL_COMMUNITY): Payer: BC Managed Care – PPO | Admitting: Hematology

## 2021-08-15 IMAGING — PT NM PET TUM IMG INITIAL (PI) SKULL BASE T - THIGH
1 of 10 series · 1 of 25 positions shown · non-contrast
Comparison: CTs of the chest, abdomen and pelvis 06/21/2019

CLINICAL DATA: Initial treatment strategy for esophageal
adenocarcinoma.

EXAM:
NUCLEAR MEDICINE PET SKULL BASE TO THIGH
TECHNIQUE: 15.57 mCi F-18 FDG was injected intravenously. Full-ring PET imaging
was performed from the skull base to thigh after the radiotracer. CT
data was obtained and used for attenuation correction and anatomic
localization.
Fasting blood glucose: 118 mg/dl

[Series 3: ct wb 5.0 b30f · axial · 5.0mm · 0.98mm/px · 1 of 367 slices shown]
[im 367/367  brain]
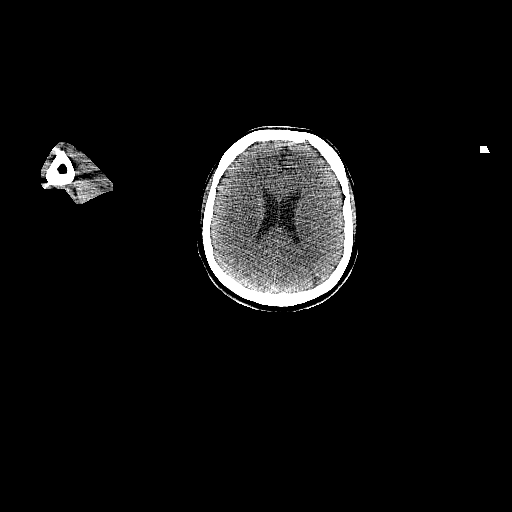

[1 of 25 positions shown; findings below may reference images not displayed]

FINDINGS: Mediastinal blood pool activity: SUV max

NECK:

No hypermetabolic cervical lymph nodes are identified.There are no
lesions of the pharyngeal mucosal space. The cervical esophagus
appears unremarkable.

Incidental CT findings: none

CHEST:

There is long segment hypermetabolic activity involving the distal
esophagus, extending into the gastric cardia. This extends over
approximately 9 cm in length and has an SUV max of 37.4. There are
no hypermetabolic mediastinal, hilar or axillary lymph nodes. There
is no hypermetabolic pulmonary activity or suspicious pulmonary
nodularity.

Incidental CT findings: Extensive three-vessel coronary artery
atherosclerosis.

ABDOMEN/PELVIS:

There is no hypermetabolic activity within the liver, adrenal
glands, spleen or pancreas. There is no hypermetabolic nodal
activity. Specifically, there is no hypermetabolic activity within
the small lymph nodes in the gastrohepatic ligament.

Incidental CT findings: Hepatic steatosis, nonobstructing bilateral
renal calculi and a broad-based umbilical hernia containing only fat
are again noted. There is mild aortic and branch vessel
atherosclerosis.

SKELETON:

There is no hypermetabolic activity to suggest osseous metastatic
disease.

Incidental CT findings: none
IMPRESSION: 1. The known adenocarcinoma involving the distal esophagus is
markedly hypermetabolic.
2. The adjacent small lymph nodes in the gastrohepatic ligament seen
on CT are not hypermetabolic. No distant metastases.
3. Stable incidental findings including hepatic steatosis and
extensive three-vessel coronary artery atherosclerosis.
